# Patient Record
Sex: Female | Born: 1945 | Race: White | Hispanic: No | Smoking: Former smoker
Health system: Southern US, Community
[De-identification: ages and names within clinical notes are randomized; demographics above are authoritative.]

## PROBLEM LIST (undated history)

## (undated) DIAGNOSIS — D649 Anemia, unspecified: Secondary | ICD-10-CM

## (undated) DIAGNOSIS — J302 Other seasonal allergic rhinitis: Secondary | ICD-10-CM

## (undated) DIAGNOSIS — F419 Anxiety disorder, unspecified: Secondary | ICD-10-CM

## (undated) DIAGNOSIS — K219 Gastro-esophageal reflux disease without esophagitis: Secondary | ICD-10-CM

## (undated) DIAGNOSIS — Z9989 Dependence on other enabling machines and devices: Secondary | ICD-10-CM

## (undated) DIAGNOSIS — R011 Cardiac murmur, unspecified: Secondary | ICD-10-CM

## (undated) DIAGNOSIS — M25569 Pain in unspecified knee: Secondary | ICD-10-CM

## (undated) DIAGNOSIS — I639 Cerebral infarction, unspecified: Secondary | ICD-10-CM

## (undated) DIAGNOSIS — F32A Depression, unspecified: Secondary | ICD-10-CM

## (undated) DIAGNOSIS — G4733 Obstructive sleep apnea (adult) (pediatric): Secondary | ICD-10-CM

## (undated) DIAGNOSIS — I482 Chronic atrial fibrillation, unspecified: Secondary | ICD-10-CM

## (undated) DIAGNOSIS — F329 Major depressive disorder, single episode, unspecified: Secondary | ICD-10-CM

## (undated) DIAGNOSIS — T753XXA Motion sickness, initial encounter: Secondary | ICD-10-CM

## (undated) DIAGNOSIS — M1990 Unspecified osteoarthritis, unspecified site: Secondary | ICD-10-CM

## (undated) HISTORY — DX: Chronic atrial fibrillation, unspecified: I48.20

## (undated) HISTORY — DX: Obstructive sleep apnea (adult) (pediatric): G47.33

## (undated) HISTORY — PX: EYE SURGERY: SHX253

## (undated) HISTORY — PX: TUBAL LIGATION: SHX77

## (undated) HISTORY — DX: Dependence on other enabling machines and devices: Z99.89

---

## 2010-12-25 ENCOUNTER — Ambulatory Visit: Payer: Self-pay | Admitting: Internal Medicine

## 2011-03-04 ENCOUNTER — Ambulatory Visit: Payer: Self-pay | Admitting: Internal Medicine

## 2012-07-30 ENCOUNTER — Ambulatory Visit: Payer: Self-pay | Admitting: Internal Medicine

## 2012-09-02 ENCOUNTER — Ambulatory Visit: Payer: Self-pay | Admitting: Internal Medicine

## 2012-09-14 ENCOUNTER — Ambulatory Visit: Payer: Self-pay | Admitting: Internal Medicine

## 2013-09-14 ENCOUNTER — Ambulatory Visit: Payer: Self-pay | Admitting: Gastroenterology

## 2013-09-17 LAB — PATHOLOGY REPORT

## 2013-09-29 ENCOUNTER — Ambulatory Visit: Payer: Self-pay | Admitting: Internal Medicine

## 2014-03-15 ENCOUNTER — Ambulatory Visit: Payer: Self-pay | Admitting: Specialist

## 2014-05-10 ENCOUNTER — Emergency Department: Payer: Self-pay | Admitting: Emergency Medicine

## 2014-05-10 LAB — COMPREHENSIVE METABOLIC PANEL
Albumin: 3.9 g/dL (ref 3.4–5.0)
Alkaline Phosphatase: 55 U/L
Anion Gap: 6 — ABNORMAL LOW (ref 7–16)
BUN: 12 mg/dL (ref 7–18)
Bilirubin,Total: 0.5 mg/dL (ref 0.2–1.0)
CHLORIDE: 108 mmol/L — AB (ref 98–107)
Calcium, Total: 8.7 mg/dL (ref 8.5–10.1)
Co2: 29 mmol/L (ref 21–32)
Creatinine: 0.91 mg/dL (ref 0.60–1.30)
EGFR (African American): >60
EGFR (Non-African Amer.): >60
Glucose: 119 mg/dL — ABNORMAL HIGH (ref 65–99)
OSMOLALITY: 286 (ref 275–301)
POTASSIUM: 3.8 mmol/L (ref 3.5–5.1)
SGOT(AST): 14 U/L — ABNORMAL LOW (ref 15–37)
SGPT (ALT): 28 U/L (ref 12–78)
Sodium: 143 mmol/L (ref 136–145)
Total Protein: 7.4 g/dL (ref 6.4–8.2)

## 2014-05-10 LAB — CBC
HCT: 42.4 % (ref 35.0–47.0)
HGB: 14.2 g/dL (ref 12.0–16.0)
MCH: 30.8 pg (ref 26.0–34.0)
MCHC: 33.5 g/dL (ref 32.0–36.0)
MCV: 92 fL (ref 80–100)
Platelet: 202 10*3/uL (ref 150–440)
RBC: 4.62 10*6/uL (ref 3.80–5.20)
RDW: 14.7 % — AB (ref 11.5–14.5)
WBC: 8.5 10*3/uL (ref 3.6–11.0)

## 2014-05-10 LAB — TROPONIN I

## 2014-05-24 DIAGNOSIS — H35342 Macular cyst, hole, or pseudohole, left eye: Secondary | ICD-10-CM | POA: Insufficient documentation

## 2014-05-24 DIAGNOSIS — H40033 Anatomical narrow angle, bilateral: Secondary | ICD-10-CM | POA: Insufficient documentation

## 2014-05-25 DIAGNOSIS — H43823 Vitreomacular adhesion, bilateral: Secondary | ICD-10-CM | POA: Insufficient documentation

## 2014-05-25 DIAGNOSIS — H269 Unspecified cataract: Secondary | ICD-10-CM | POA: Insufficient documentation

## 2014-05-25 DIAGNOSIS — H21562 Pupillary abnormality, left eye: Secondary | ICD-10-CM | POA: Insufficient documentation

## 2014-05-27 DIAGNOSIS — I10 Essential (primary) hypertension: Secondary | ICD-10-CM | POA: Insufficient documentation

## 2014-05-27 DIAGNOSIS — F419 Anxiety disorder, unspecified: Secondary | ICD-10-CM | POA: Insufficient documentation

## 2014-07-13 DIAGNOSIS — H01009 Unspecified blepharitis unspecified eye, unspecified eyelid: Secondary | ICD-10-CM | POA: Insufficient documentation

## 2014-08-11 ENCOUNTER — Ambulatory Visit: Payer: Self-pay | Admitting: Internal Medicine

## 2015-04-28 ENCOUNTER — Encounter: Payer: Self-pay | Admitting: *Deleted

## 2015-05-01 NOTE — Discharge Instructions (Signed)

## 2015-05-03 ENCOUNTER — Encounter: Payer: Self-pay | Admitting: *Deleted

## 2015-05-03 ENCOUNTER — Ambulatory Visit: Payer: PPO | Admitting: Anesthesiology

## 2015-05-03 ENCOUNTER — Encounter
Admission: RE | Disposition: A | Discharge status: Home / Self Care | Payer: Self-pay | Source: Ambulatory Visit | Attending: Ophthalmology

## 2015-05-03 ENCOUNTER — Other Ambulatory Visit: Payer: Self-pay

## 2015-05-03 ENCOUNTER — Encounter: Payer: Self-pay | Admitting: Emergency Medicine

## 2015-05-03 ENCOUNTER — Ambulatory Visit
Admission: RE | Admit: 2015-05-03 | Discharge: 2015-05-03 | Disposition: A | Discharge status: Home / Self Care | Payer: PPO | Source: Ambulatory Visit | Attending: Ophthalmology | Admitting: Ophthalmology

## 2015-05-03 ENCOUNTER — Emergency Department
Admission: EM | Admit: 2015-05-03 | Discharge: 2015-05-03 | Disposition: A | Discharge status: Home / Self Care | Payer: PPO | Attending: Emergency Medicine | Admitting: Emergency Medicine

## 2015-05-03 ENCOUNTER — Ambulatory Visit (INDEPENDENT_AMBULATORY_CARE_PROVIDER_SITE_OTHER): Payer: PPO | Admitting: Family Medicine

## 2015-05-03 ENCOUNTER — Encounter: Payer: Self-pay | Admitting: Family Medicine

## 2015-05-03 VITALS — BP 140/74 | HR 85 | Temp 98.0°F | Resp 17 | Wt 220.2 lb

## 2015-05-03 DIAGNOSIS — M1990 Unspecified osteoarthritis, unspecified site: Secondary | ICD-10-CM | POA: Insufficient documentation

## 2015-05-03 DIAGNOSIS — Z79899 Other long term (current) drug therapy: Secondary | ICD-10-CM | POA: Diagnosis not present

## 2015-05-03 DIAGNOSIS — Z87891 Personal history of nicotine dependence: Secondary | ICD-10-CM | POA: Diagnosis not present

## 2015-05-03 DIAGNOSIS — F329 Major depressive disorder, single episode, unspecified: Secondary | ICD-10-CM | POA: Insufficient documentation

## 2015-05-03 DIAGNOSIS — M25569 Pain in unspecified knee: Secondary | ICD-10-CM | POA: Diagnosis not present

## 2015-05-03 DIAGNOSIS — K219 Gastro-esophageal reflux disease without esophagitis: Secondary | ICD-10-CM | POA: Insufficient documentation

## 2015-05-03 DIAGNOSIS — R9431 Abnormal electrocardiogram [ECG] [EKG]: Secondary | ICD-10-CM | POA: Diagnosis present

## 2015-05-03 DIAGNOSIS — Z91018 Allergy to other foods: Secondary | ICD-10-CM | POA: Insufficient documentation

## 2015-05-03 DIAGNOSIS — I4891 Unspecified atrial fibrillation: Secondary | ICD-10-CM | POA: Diagnosis not present

## 2015-05-03 DIAGNOSIS — Z91040 Latex allergy status: Secondary | ICD-10-CM | POA: Diagnosis not present

## 2015-05-03 DIAGNOSIS — H2512 Age-related nuclear cataract, left eye: Secondary | ICD-10-CM | POA: Diagnosis not present

## 2015-05-03 DIAGNOSIS — G4730 Sleep apnea, unspecified: Secondary | ICD-10-CM | POA: Diagnosis not present

## 2015-05-03 DIAGNOSIS — R011 Cardiac murmur, unspecified: Secondary | ICD-10-CM | POA: Insufficient documentation

## 2015-05-03 DIAGNOSIS — Z91041 Radiographic dye allergy status: Secondary | ICD-10-CM | POA: Insufficient documentation

## 2015-05-03 DIAGNOSIS — Z91048 Other nonmedicinal substance allergy status: Secondary | ICD-10-CM | POA: Diagnosis not present

## 2015-05-03 HISTORY — DX: Motion sickness, initial encounter: T75.3XXA

## 2015-05-03 HISTORY — DX: Cardiac murmur, unspecified: R01.1

## 2015-05-03 HISTORY — DX: Pain in unspecified knee: M25.569

## 2015-05-03 HISTORY — DX: Unspecified osteoarthritis, unspecified site: M19.90

## 2015-05-03 HISTORY — PX: RETINAL DETACHMENT SURGERY: SHX105

## 2015-05-03 HISTORY — DX: Major depressive disorder, single episode, unspecified: F32.9

## 2015-05-03 HISTORY — DX: Anemia, unspecified: D64.9

## 2015-05-03 HISTORY — DX: Depression, unspecified: F32.A

## 2015-05-03 HISTORY — DX: Gastro-esophageal reflux disease without esophagitis: K21.9

## 2015-05-03 HISTORY — PX: CATARACT EXTRACTION W/PHACO: SHX586

## 2015-05-03 LAB — BASIC METABOLIC PANEL
Anion gap: 6 (ref 5–15)
BUN: 16 mg/dL (ref 6–20)
CO2: 28 mmol/L (ref 22–32)
Calcium: 8.9 mg/dL (ref 8.9–10.3)
Chloride: 107 mmol/L (ref 101–111)
Creatinine, Ser: 0.99 mg/dL (ref 0.44–1.00)
GFR calc Af Amer: >60 mL/min (ref >60)
GFR calc non Af Amer: 58 mL/min — ABNORMAL LOW (ref >60)
GLUCOSE: 96 mg/dL (ref 65–99)
Potassium: 4.3 mmol/L (ref 3.5–5.1)
Sodium: 141 mmol/L (ref 135–145)

## 2015-05-03 LAB — CBC
HCT: 40.5 % (ref 35.0–47.0)
HEMOGLOBIN: 13.2 g/dL (ref 12.0–16.0)
MCH: 29.8 pg (ref 26.0–34.0)
MCHC: 32.7 g/dL (ref 32.0–36.0)
MCV: 91.3 fL (ref 80.0–100.0)
Platelets: 185 10*3/uL (ref 150–440)
RBC: 4.43 MIL/uL (ref 3.80–5.20)
RDW: 14.8 % — AB (ref 11.5–14.5)
WBC: 7.9 10*3/uL (ref 3.6–11.0)

## 2015-05-03 SURGERY — PHACOEMULSIFICATION, CATARACT, WITH IOL INSERTION
Anesthesia: Monitor Anesthesia Care | Laterality: Left | Wound class: Clean

## 2015-05-03 MED ORDER — CEFUROXIME OPHTHALMIC INJECTION 1 MG/0.1 ML
INJECTION | OPHTHALMIC | Status: DC | PRN
  Administered 2015-05-03: 0.1 mL via INTRACAMERAL

## 2015-05-03 MED ORDER — ACETAMINOPHEN 325 MG PO TABS: 325.0000 mg | ORAL_TABLET | ORAL | Status: DC | PRN

## 2015-05-03 MED ORDER — BRIMONIDINE TARTRATE 0.2 % OP SOLN
OPHTHALMIC | Status: DC | PRN
  Administered 2015-05-03: 1 [drp]

## 2015-05-03 MED ORDER — TIMOLOL MALEATE 0.5 % OP SOLN
OPHTHALMIC | Status: DC | PRN
  Administered 2015-05-03: 1 [drp]

## 2015-05-03 MED ORDER — ACETAMINOPHEN 160 MG/5ML PO SOLN: 325.0000 mg | ORAL | Status: DC | PRN

## 2015-05-03 MED ORDER — NA HYALUR & NA CHOND-NA HYALUR 0.4-0.35 ML IO KIT
PACK | INTRAOCULAR | Status: DC | PRN
  Administered 2015-05-03: 1 mL via INTRAOCULAR

## 2015-05-03 MED ORDER — BSS IO SOLN
INTRAOCULAR | Status: DC | PRN
  Administered 2015-05-03: 15 mL via INTRAOCULAR

## 2015-05-03 MED ORDER — POVIDONE-IODINE 5 % OP SOLN
1.0000 "application " | OPHTHALMIC | Status: DC | PRN
  Administered 2015-05-03: 1 via OPHTHALMIC

## 2015-05-03 MED ORDER — MIDAZOLAM HCL 2 MG/2ML IJ SOLN
INTRAMUSCULAR | Status: DC | PRN
  Administered 2015-05-03: 1 mg via INTRAVENOUS

## 2015-05-03 MED ORDER — ARMC OPHTHALMIC DILATING GEL
1.0000 "application " | OPHTHALMIC | Status: DC | PRN
  Administered 2015-05-03 (×2): 1 via OPHTHALMIC

## 2015-05-03 MED ORDER — LACTATED RINGERS IV SOLN: INTRAVENOUS | Status: DC

## 2015-05-03 MED ORDER — EPINEPHRINE HCL 1 MG/ML IJ SOLN
INTRAOCULAR | Status: DC | PRN
  Administered 2015-05-03: 89 mL via OPHTHALMIC

## 2015-05-03 MED ORDER — FENTANYL CITRATE (PF) 100 MCG/2ML IJ SOLN
INTRAMUSCULAR | Status: DC | PRN
  Administered 2015-05-03: 50 ug via INTRAVENOUS

## 2015-05-03 MED ORDER — TETRACAINE HCL 0.5 % OP SOLN
1.0000 [drp] | OPHTHALMIC | Status: DC | PRN
  Administered 2015-05-03: 1 [drp] via OPHTHALMIC

## 2015-05-03 SURGICAL SUPPLY — 26 items
CANNULA ANT/CHMB 27GA (MISCELLANEOUS) ×2 IMPLANT
GLOVE SURG LX 7.5 STRW (GLOVE) ×1
GLOVE SURG LX STRL 7.5 STRW (GLOVE) ×1 IMPLANT
GLOVE SURG TRIUMPH 8.0 PF LTX (GLOVE) ×2 IMPLANT
GOWN STRL REUS W/ TWL LRG LVL3 (GOWN DISPOSABLE) ×2 IMPLANT
GOWN STRL REUS W/TWL LRG LVL3 (GOWN DISPOSABLE) ×2
LENS IOL TECNIS 22.0 (Intraocular Lens) ×2 IMPLANT
LENS IOL TECNIS MONO 1P 22.0 (Intraocular Lens) ×1 IMPLANT
MARKER SKIN SURG W/RULER VIO (MISCELLANEOUS) ×2 IMPLANT
NDL RETROBULBAR .5 NSTRL (NEEDLE) IMPLANT
NEEDLE FILTER BLUNT 18X 1/2SAF (NEEDLE) ×1
NEEDLE FILTER BLUNT 18X1 1/2 (NEEDLE) ×1 IMPLANT
PACK CATARACT BRASINGTON (MISCELLANEOUS) ×2 IMPLANT
PACK EYE AFTER SURG (MISCELLANEOUS) ×2 IMPLANT
PACK OPTHALMIC (MISCELLANEOUS) ×2 IMPLANT
RING MALYGIN 7.0 (MISCELLANEOUS) IMPLANT
SUT ETHILON 10-0 CS-B-6CS-B-6 (SUTURE)
SUT VICRYL  9 0 (SUTURE)
SUT VICRYL 9 0 (SUTURE) IMPLANT
SUTURE EHLN 10-0 CS-B-6CS-B-6 (SUTURE) IMPLANT
SYR 3ML LL SCALE MARK (SYRINGE) ×2 IMPLANT
SYR 5ML LL (SYRINGE) IMPLANT
SYR TB 1ML LUER SLIP (SYRINGE) ×2 IMPLANT
WATER STERILE IRR 250ML POUR (IV SOLUTION) ×2 IMPLANT
WATER STERILE IRR 500ML POUR (IV SOLUTION) IMPLANT
WIPE NON LINTING 3.25X3.25 (MISCELLANEOUS) ×2 IMPLANT

## 2015-05-03 NOTE — Anesthesia Postprocedure Evaluation (Signed)
  Anesthesia Post-op Note  Patient: Crystal Haas  Procedure(s) Performed: Procedure(s) with comments: CATARACT EXTRACTION PHACO AND INTRAOCULAR LENS PLACEMENT (IOC) (Left) - CPAP  Anesthesia type:MAC  Patient location: PACU  Post pain: Pain level controlled  Post assessment: Post-op Vital signs reviewed, Patient's Cardiovascular Status Stable, Respiratory Function Stable, Patent Airway and No signs of Nausea or vomiting  Post vital signs: Reviewed and stable  Last Vitals:  Filed Vitals:   05/03/15 1028  BP:   Pulse: 71  Temp: 36.3 C  Resp: 16    Level of consciousness: awake, alert  and patient cooperative  Complications: No apparent anesthesia complications.  Pt noted to be in new-onset Atrial Fibrillation in PACU, confirmed with 12-lead EKG.  Pt remained stable with HR in 70s, and normal BP.  Pt feeling well.  Called Pts primary MD, Dr. Sherryll BurgerShah, and discussed having her go directly to his office.  He agreed to see her today when she arrives.  We agreed that she is stable and able to be transported with her friend who is with her now.  Dr. Sherryll BurgerShah will follow up as indicated.

## 2015-05-03 NOTE — ED Notes (Signed)
Pt sent over by pcp with new onset of AFIB. Pt had eye surgery this am and ekg showed afib after surgery which is new for pt. Pt denies any pain at this time.

## 2015-05-03 NOTE — Op Note (Signed)
OPERATIVE NOTE  Crystal Haas [id number] 05/03/2015   PREOPERATIVE DIAGNOSIS:  Nuclear sclerotic cataract left eye. H25.12   POSTOPERATIVE DIAGNOSIS:    Nuclear sclerotic cataract left eye.     PROCEDURE:  Phacoemusification with posterior chamber intraocular lens placement of the left eye   LENS:   Implant Name Type Inv. Item Serial No. Manufacturer Lot No. LRB No. Used  zcboo lens     [id number] [employer]   Left 1     [id number] 22.0 D   ULTRAS D TIME: 15% of 1minutes 35econds, CDE 14.5  SURGEON:  Deirdre Evenerhadwick R. Marcelus Dubberly, MD   ANESTHESIA:  Topical with tetracaine drops and 2% Xylocaine jelly.   COMPLICATIONS:  None.   DESCRIPTION OF PROCEDURE:  The patient was identified in the holding room and transported to the operating room and placed in the supine position under the operating microscope.  The left eye was identified as the operative eye and it was prepped and draped in the usual sterile ophthalmic fashion.   A 1 millimeter clear-corneal paracentesis was made at the 1:30 position.  The anterior chamber was filled with Viscoat viscoelastic.  A 2.4 millimeter keratome was used to make a near-clear corneal incision at the 10:30 position.  .  A curvilinear capsulorrhexis was made with a cystotome and capsulorrhexis forceps.  Balanced salt solution was used to hydrodissect and hydrodelineate the nucleus.   Phacoemulsification was then used in stop and chop fashion to remove the lens nucleus and epinucleus.  The remaining cortex was then removed using the irrigation and aspiration handpiece. Provisc was then placed into the capsular bag to distend it for lens placement.  A lens was then injected into the capsular bag.  The remaining viscoelastic was aspirated.   Wounds were hydrated with balanced salt solution.  The anterior chamber was inflated to a physiologic pressure with balanced salt solution.  No wound leaks were noted. Cefuroxime 0.1 ml of a 10mg /ml solution was  injected into the anterior chamber for a dose of 1 mg of intracameral antibiotic at the completion of the case.  Timolol and Brimonidine drops were applied to the eye.  The patient was taken to the recovery room in stable condition without complications of anesthesia or surgery.  Crystal Haas 05/03/2015, 10:28 AM

## 2015-05-03 NOTE — H&P (Signed)
  The History and Physical notes were scanned in.  The patient remains stable and unchanged from the H&P.   Previous H&P reviewed, patient examined, and there are no changes.  Crystal Haas 05/03/2015 12:35 PM

## 2015-05-03 NOTE — Anesthesia Preprocedure Evaluation (Signed)
Anesthesia Evaluation  Patient identified by MRN, date of birth, ID band  Reviewed: Allergy & Precautions, H&P , NPO status , Patient's Chart, lab work & pertinent test results  Airway Mallampati: II  TM Distance: >3 FB Neck ROM: full    Dental no notable dental hx.    Pulmonary sleep apnea , former smoker,    Pulmonary exam normal       Cardiovascular Rhythm:regular Rate:Normal     Neuro/Psych    GI/Hepatic GERD-  ,  Endo/Other    Renal/GU      Musculoskeletal   Abdominal   Peds  Hematology   Anesthesia Other Findings   Reproductive/Obstetrics                             Anesthesia Physical Anesthesia Plan  ASA: II  Anesthesia Plan: MAC   Post-op Pain Management:    Induction:   Airway Management Planned:   Additional Equipment:   Intra-op Plan:   Post-operative Plan:   Informed Consent: I have reviewed the patients History and Physical, chart, labs and discussed the procedure including the risks, benefits and alternatives for the proposed anesthesia with the patient or authorized representative who has indicated his/her understanding and acceptance.     Plan Discussed with: CRNA  Anesthesia Plan Comments:         Anesthesia Quick Evaluation

## 2015-05-03 NOTE — Discharge Instructions (Signed)
Atrial Fibrillation °Atrial fibrillation is a type of irregular heart rhythm (arrhythmia). During atrial fibrillation, the upper chambers of the heart (atria) quiver continuously in a chaotic pattern. This causes an irregular and often rapid heart rate.  °Atrial fibrillation is the result of the heart becoming overloaded with disorganized signals that tell it to beat. These signals are normally released one at a time by a part of the right atrium called the sinoatrial node. They then travel from the atria to the lower chambers of the heart (ventricles), causing the atria and ventricles to contract and pump blood as they pass. In atrial fibrillation, parts of the atria outside of the sinoatrial node also release these signals. This results in two problems. First, the atria receive so many signals that they do not have time to fully contract. Second, the ventricles, which can only receive one signal at a time, beat irregularly and out of rhythm with the atria.  °There are three types of atrial fibrillation:  °· Paroxysmal. Paroxysmal atrial fibrillation starts suddenly and stops on its own within a week. °· Persistent. Persistent atrial fibrillation lasts for more than a week. It may stop on its own or with treatment. °· Permanent. Permanent atrial fibrillation does not go away. Episodes of atrial fibrillation may lead to permanent atrial fibrillation. °Atrial fibrillation can prevent your heart from pumping blood normally. It increases your risk of stroke and can lead to heart failure.  °CAUSES  °· Heart conditions, including a heart attack, heart failure, coronary artery disease, and heart valve conditions.   °· Inflammation of the sac that surrounds the heart (pericarditis). °· Blockage of an artery in the lungs (pulmonary embolism). °· Pneumonia or other infections. °· Chronic lung disease. °· Thyroid problems, especially if the thyroid is overactive (hyperthyroidism). °· Caffeine, excessive alcohol use, and use  of some illegal drugs.   °· Use of some medicines, including certain decongestants and diet pills. °· Heart surgery.   °· Birth defects.   °Sometimes, no cause can be found. When this happens, the atrial fibrillation is called lone atrial fibrillation. The risk of complications from atrial fibrillation increases if you have lone atrial fibrillation and you are age 60 years or older. °RISK FACTORS °· Heart failure. °· Coronary artery disease. °· Diabetes mellitus.   °· High blood pressure (hypertension).   °· Obesity.   °· Other arrhythmias.   °· Increased age. °SIGNS AND SYMPTOMS  °· A feeling that your heart is beating rapidly or irregularly.   °· A feeling of discomfort or pain in your chest.   °· Shortness of breath.   °· Sudden light-headedness or weakness.   °· Getting tired easily when exercising.   °· Urinating more often than normal (mainly when atrial fibrillation first begins).   °In paroxysmal atrial fibrillation, symptoms may start and suddenly stop. °DIAGNOSIS  °Your health care provider may be able to detect atrial fibrillation when taking your pulse. Your health care provider may have you take a test called an ambulatory electrocardiogram (ECG). An ECG records your heartbeat patterns over a 24-hour period. You may also have other tests, such as: °· Transthoracic echocardiogram (TTE). During echocardiography, sound waves are used to evaluate how blood flows through your heart. °· Transesophageal echocardiogram (TEE). °· Stress test. There is more than one type of stress test. If a stress test is needed, ask your health care provider about which type is best for you. °· Chest X-ray exam. °· Blood tests. °· Computed tomography (CT). °TREATMENT  °Treatment may include: °· Treating any underlying conditions. For example, if you   have an overactive thyroid, treating the condition may correct atrial fibrillation.  Taking medicine. Medicines may be given to control a rapid heart rate or to prevent blood  clots, heart failure, or a stroke.  Having a procedure to correct the rhythm of the heart:  Electrical cardioversion. During electrical cardioversion, a controlled, low-energy shock is delivered to the heart through your skin. If you have chest pain, very low blood pressure, or sudden heart failure, this procedure may need to be done as an emergency.  Catheter ablation. During this procedure, heart tissues that send the signals that cause atrial fibrillation are destroyed.  Surgical ablation. During this surgery, thin lines of heart tissue that carry the abnormal signals are destroyed. This procedure can either be an open-heart surgery or a minimally invasive surgery. With the minimally invasive surgery, small cuts are made to access the heart instead of a large opening.  Pulmonary venous isolation. During this surgery, tissue around the veins that carry blood from the lungs (pulmonary veins) is destroyed. This tissue is thought to carry the abnormal signals. HOME CARE INSTRUCTIONS   Take medicines only as directed by your health care provider. Some medicines can make atrial fibrillation worse or recur.  If blood thinners were prescribed by your health care provider, take them exactly as directed. Too much blood-thinning medicine can cause bleeding. If you take too little, you will not have the needed protection against stroke and other problems.  Perform blood tests at home if directed by your health care provider. Perform blood tests exactly as directed.  Quit smoking if you smoke.  Do not drink alcohol.  Do not drink caffeinated beverages such as coffee, soda, and some teas. You may drink decaffeinated coffee, soda, or tea.   Maintain a healthy weight.Do not use diet pills unless your health care provider approves. They may make heart problems worse.   Follow diet instructions as directed by your health care provider.  Exercise regularly as directed by your health care  provider.  Keep all follow-up visits as directed by your health care provider. This is important. PREVENTION  The following substances can cause atrial fibrillation to recur:   Caffeinated beverages.  Alcohol.  Certain medicines, especially those used for breathing problems.  Certain herbs and herbal medicines, such as those containing ephedra or ginseng.  Illegal drugs, such as cocaine and amphetamines. Sometimes medicines are given to prevent atrial fibrillation from recurring. Proper treatment of any underlying condition is also important in helping prevent recurrence.  SEEK MEDICAL CARE IF:  You notice a change in the rate, rhythm, or strength of your heartbeat.  You suddenly begin urinating more frequently.  You tire more easily when exerting yourself or exercising. SEEK IMMEDIATE MEDICAL CARE IF:   You have chest pain, abdominal pain, sweating, or weakness.  You feel nauseous.  You have shortness of breath.  You suddenly have swollen feet and ankles.  You feel dizzy.  Your face or limbs feel numb or weak.  You have a change in your vision or speech. MAKE SURE YOU:   Understand these instructions.  Will watch your condition.  Will get help right away if you are not doing well or get worse. Document Released: 10/07/2005 Document Revised: 02/21/2014 Document Reviewed: 11/17/2012 [facility] Patient Information 2015 [place], [place]. This information is not intended to replace advice given to you by your health care provider. Make sure you discuss any questions you have with your health care provider.      You  have been seen in the hospital today for new onset atrial fibrillation. As we have discussed please call the number provided for cardiology. He will need to be seen tomorrow in the office. If you have any issues being seen please let them know that we discussed your plan of care with Dr. Melburn PopperNasher who recommended you be seen tomorrow in the office. As we have  discussed please return to the emergency department if you have any trouble breathing, chest pain, fast heart rate, dizziness, or lightheadedness, also if you have any weakness or numbness of any arm or leg, slurred speech or confusion.

## 2015-05-03 NOTE — ED Notes (Signed)
Pt reports having having cataract surgery this morning.  Afterwards pt developed afib.  Pt sent to er for eval.  No chest pain, no sob. , no dizziness, no weakness.  Pt alert   Speech clear.  afib on monitor now.   md at bedside.

## 2015-05-03 NOTE — ED Notes (Signed)

## 2015-05-03 NOTE — ED Provider Notes (Signed)
[facility] Emergency Department Provider Note  Time seen: 5:51 PM  I have reviewed the triage vital signs and the nursing notes.   HISTORY  Chief Complaint Abnormal ECG    HPI Delene LollDiane Huyett is a 69 y.o. female with a past medical history of gastric reflux, depression, arthritis who presents to the emergency department with an irregular heartbeat. According to the patient she had a cataract surgery today. After the operation they noted her heart to be beating irregularly, they performed an EKG which showed atrial fibrillation in the setting the patient to the emergency department for evaluation. Patient denies any history of atrial fibrillation. Denies any cardiac history whatsoever. The patient has never seen a cardiologist. Patient cannot feel her heart beating irregularly. Denies any chest pain or shortness of breath. Denies any symptoms whatsoever.     Past Medical History  Diagnosis Date  . Heart murmur     mild - followed by PCP  . GERD (gastroesophageal reflux disease)   . Anemia     distant past  . Depression   . Arthritis     "everywhere" - big toes worst  . Knee pain   . Sleep apnea     uses CPAP SS - [facility] 2013  . Motion sickness     back seat of car    Patient Active Problem List   Diagnosis Date Noted  . New onset atrial fibrillation 05/03/2015    Past Surgical History  Procedure Laterality Date  . Retinal detachment surgery    . Tubal ligation      Current Outpatient Rx  Name  Route  Sig  Dispense  Refill  . diphenhydramine-acetaminophen (TYLENOL PM) 25-500 MG TABS   Oral   Take 1 tablet by mouth at bedtime as needed.         . DUREZOL 0.05 % EMUL            0     Dispense as written.   . eszopiclone (LUNESTA) 1 MG TABS tablet   Oral   Take by mouth.         . eszopiclone (LUNESTA) 2 MG TABS tablet   Oral   Take 2 mg by mouth at bedtime as needed for sleep. Take immediately before bedtime         .  fexofenadine (ALLEGRA) 180 MG tablet   Oral   Take 180 mg by mouth daily. PM         . glucosamine-chondroitin 500-400 MG tablet   Oral   Take 1 tablet by mouth daily. PM         . ketorolac (ACULAR) 0.4 % SOLN            0   . Multiple Vitamins-Minerals (CENTRUM PO)   Oral   Take by mouth. AM         . NAPHAZOLINE-PHENIRAMINE OP   Ophthalmic   Apply to eye.         Marland Kitchen. PARoxetine (PAXIL) 20 MG tablet   Oral   Take 20 mg by mouth daily. AM         . PARoxetine (PAXIL) 20 MG tablet   Oral   Take by mouth.         . temazepam (RESTORIL) 15 MG capsule   Oral   Take by mouth.         Marland Kitchen. VIGAMOX 0.5 % ophthalmic solution  0     Dispense as written.     Allergies Apple; Daucus carota; Ivp dye; Other; Strawberry; Iodine; Latex; and Tape  No family history on file.  Social History History  Substance Use Topics  . Smoking status: Former Smoker    Quit date: 10/21/1969  . Smokeless tobacco: Not on file  . Alcohol Use: 4.2 oz/week    7 Shots of liquor per week    Review of Systems Constitutional: Negative for fever. Cardiovascular: Negative for chest pain. Respiratory: Negative for shortness of breath. Gastrointestinal: Negative for abdominal pain Musculoskeletal: Negative for back pain. Neurological: Negative for headaches, focal weakness or numbness.  10-point ROS otherwise negative.  ____________________________________________   PHYSICAL EXAM:  VITAL SIGNS: ED Triage Vitals  Enc Vitals Group     BP 05/03/15 1627 156/99 mmHg     Pulse Rate 05/03/15 1627 80     Resp 05/03/15 1627 18     Temp 05/03/15 1629 98 F (36.7 C)     Temp Source 05/03/15 1629 Oral     SpO2 05/03/15 1627 97 %     Weight 05/03/15 1627 220 lb (99.791 kg)     Height 05/03/15 1627  (1.651 m)     Head Cir --      Peak Flow --      Pain Score --      Pain Loc --      Pain Edu? --      Excl. in GC? --     Constitutional: Alert and oriented.  Well appearing and in no distress. Eyes: Dilated left pupil status post surgery. ENT   Mouth/Throat: Mucous membranes are moist. Cardiovascular: Irregular rhythm, with a normal rate around 85 bpm. Respiratory: Normal respiratory effort without tachypnea nor retractions. Breath sounds are clear and equal bilaterally. No wheezes/rales/rhonchi. Gastrointestinal: Soft and nontender. No distention.   Musculoskeletal: Nontender with normal range of motion in all extremities. Mild bilateral tenderness, and pedal edema patient states which is chronic. Neurologic:  Normal speech and language. No gross focal neurologic deficits are appreciated. Speech is normal. Skin:  Skin is warm, dry and intact.  Psychiatric: Mood and affect are normal. Speech and behavior are normal.   ____________________________________________    EKG  EKG reviewed and interpreted by myself shows atrial fibrillation at 76 bpm, narrow QRS, left axis deviation, no ST changes noted.  ____________________________________________     INITIAL IMPRESSION / ASSESSMENT AND PLAN / ED COURSE  Pertinent labs & imaging results that were available during my care of the patient were reviewed by me and considered in my medical decision making (see chart for details).  Patient with new onset atrial fibrillation. We will check labs, and discussed with cardiology. We'll likely need to discuss with ophthalmology as well as patient had a surgery performed today, as far as anticoagulation recommendations.   ----------------------------------------- 6:30 PM on 05/03/2015 -----------------------------------------  Basic lab work largely within normal limits. Patient remains completely asymptomatic from her new onset atrial fibrillation. As it is rate controlled, and the patient has no symptoms at this time, I discussed the patient with the on-call cardiologist Dr.Nasher, who recommends discharging the patient home with follow-up in their  office tomorrow. He states he would not start any anticoagulation or aspirin therapy at this time until they can evaluate her. We will discharge the patient home with cardiology follow-up. I discussed this with patient, as well as strict return precautions for any chest pain, shortness of breath, or increased heart  rate. Patient is agreeable to plan.  ____________________________________________   FINAL CLINICAL IMPRESSION(S) / ED DIAGNOSES  New onset atrial fibrillation   Minna Antis, MD 05/03/15 1850

## 2015-05-03 NOTE — ED Notes (Signed)
Dr. Lenard LancePaduchowski aware of patient's bp, states ok to discharge patient.

## 2015-05-03 NOTE — Transfer of Care (Signed)
Immediate Anesthesia Transfer of Care Note  Patient: Crystal LollDiane Tersigni  Procedure(s) Performed: Procedure(s) with comments: CATARACT EXTRACTION PHACO AND INTRAOCULAR LENS PLACEMENT (IOC) (Left) - CPAP  Patient Location: PACU  Anesthesia Type: MAC  Level of Consciousness: awake, alert  and patient cooperative  Airway and Oxygen Therapy: Patient Spontanous Breathing and Patient connected to supplemental oxygen  Post-op Assessment: Post-op Vital signs reviewed, Patient's Cardiovascular Status Stable, Respiratory Function Stable, Patent Airway and No signs of Nausea or vomiting  Post-op Vital Signs: Reviewed and stable  Complications: No apparent anesthesia complications

## 2015-05-03 NOTE — Progress Notes (Signed)
Name: Crystal Haas   MRN: [id number]    DOB: 1946-01-05   Date:05/03/2015       Progress Note  Subjective  Chief Complaint  Chief Complaint  Patient presents with  . Follow-up    check heart    HPI Pt. Is sent over from [facility] by Dr. Francena Hanly (Anesthesiologist) for an episode of Atrial fibrillation detected on the monitor in the recovery area after her surgery for cataracts this morning. She did not feel any unusual or concerning symptoms at that time. She has no history of atrial fibrillation or heart disease and does not see a cardiologist. Upon arriving in our office this afternoon, she feels well, denies any chest pain, dyspnea, dizziness, palpitations, nausea, vomiting, or other concerning symptoms.   Past Medical History  Diagnosis Date  . Heart murmur     mild - followed by PCP  . GERD (gastroesophageal reflux disease)   . Anemia     distant past  . Depression   . Arthritis     "everywhere" - big toes worst  . Knee pain   . Sleep apnea     uses CPAP SS - [facility] 2013  . Motion sickness     back seat of car    Past Surgical History  Procedure Laterality Date  . Retinal detachment surgery    . Tubal ligation      No family history on file.  History   Social History  . Marital Status: Widowed    Spouse Name: N/A  . Number of Children: N/A  . Years of Education: N/A   Occupational History  . Not on file.   Social History Main Topics  . Smoking status: Former Smoker    Quit date: 10/21/1969  . Smokeless tobacco: Not on file  . Alcohol Use: 4.2 oz/week    7 Shots of liquor per week  . Drug Use: Not on file  . Sexual Activity: Not on file   Other Topics Concern  . Not on file   Social History Narrative     Current outpatient prescriptions:  .  diphenhydramine-acetaminophen (TYLENOL PM) 25-500 MG TABS, Take 1 tablet by mouth at bedtime as needed., Disp: , Rfl:  .  DUREZOL 0.05 % EMUL, , Disp: , Rfl: 0 .  eszopiclone (LUNESTA) 1 MG TABS  tablet, Take by mouth., Disp: , Rfl:  .  eszopiclone (LUNESTA) 2 MG TABS tablet, Take 2 mg by mouth at bedtime as needed for sleep. Take immediately before bedtime, Disp: , Rfl:  .  fexofenadine (ALLEGRA) 180 MG tablet, Take 180 mg by mouth daily. PM, Disp: , Rfl:  .  glucosamine-chondroitin 500-400 MG tablet, Take 1 tablet by mouth daily. PM, Disp: , Rfl:  .  ketorolac (ACULAR) 0.4 % SOLN, , Disp: , Rfl: 0 .  Multiple Vitamins-Minerals (CENTRUM PO), Take by mouth. AM, Disp: , Rfl:  .  NAPHAZOLINE-PHENIRAMINE OP, Apply to eye., Disp: , Rfl:  .  PARoxetine (PAXIL) 20 MG tablet, Take 20 mg by mouth daily. AM, Disp: , Rfl:  .  PARoxetine (PAXIL) 20 MG tablet, Take by mouth., Disp: , Rfl:  .  temazepam (RESTORIL) 15 MG capsule, Take by mouth., Disp: , Rfl:  .  VIGAMOX 0.5 % ophthalmic solution, , Disp: , Rfl: 0  Allergies  Allergen Reactions  . Apple Anaphylaxis    Throat swells but subsides with po benadry  . Daucus Carota Anaphylaxis    Peeling of carrot only but subsides with  po benadry  . Ivp Dye [Iodinated Diagnostic Agents] Shortness Of Breath    Also swelling.  Topical betadine is OK.  . Other Swelling    Raw fruits cause throat to swell. Topical contact with carrots cause hands to swell, ok to eat.  . Strawberry Anaphylaxis    Throat swells but subsides with benadryl  . Iodine Swelling    IV   . Latex Swelling    Hands swelled after wearing gloves at fruit stand.   . Tape Other (See Comments)    Most tapes case raw skin.  Paper tape is OK.     Review of Systems  Constitutional: Negative for fever, chills, malaise/fatigue and diaphoresis.  Respiratory: Negative for cough and shortness of breath.   Cardiovascular: Negative for chest pain, palpitations, orthopnea and leg swelling.  Neurological: Negative for dizziness.      Objective  Filed Vitals:   05/03/15 1350  BP: 140/74  Pulse: 85  Temp: 98 F (36.7 C)  TempSrc: Oral  Resp: 17  Weight: 220 lb 3.2 oz (99.882  kg)  SpO2: 93%    Physical Exam  Constitutional: She is oriented to person, place, and time and well-developed, well-nourished, and in no distress.  Neck: Carotid bruit is not present.  Cardiovascular: Normal rate, regular rhythm, S1 normal, S2 normal and normal heart sounds.   Pulmonary/Chest: Effort normal and breath sounds normal.  Abdominal: Soft. Bowel sounds are normal.  Musculoskeletal: She exhibits tenderness.       Left ankle: Tenderness.  Neurological: She is alert and oriented to person, place, and time.  Skin: Skin is warm and dry.  Nursing note and vitals reviewed.    Assessment & Plan 1. New onset atrial fibrillation - EKG 12-Lead  EKG in our office shows A-fib with controlled ventricular rate. Pt. Is asymptomatic. [facility] for complete evaluation of new-onset A-fib. Spoke with the ER charge Nurse in this regard. Pt. Will proceed to the ER with her friend.    Laela Deviney Asad A. Faylene KurtzShah [facility] 05/03/2015 2:38 PM

## 2015-05-04 ENCOUNTER — Ambulatory Visit (INDEPENDENT_AMBULATORY_CARE_PROVIDER_SITE_OTHER): Payer: PPO | Admitting: Cardiovascular Disease

## 2015-05-04 ENCOUNTER — Encounter: Payer: Self-pay | Admitting: Ophthalmology

## 2015-05-04 VITALS — BP 120/76 | HR 86 | Ht 65.0 in | Wt 218.0 lb

## 2015-05-04 DIAGNOSIS — I4891 Unspecified atrial fibrillation: Secondary | ICD-10-CM

## 2015-05-04 NOTE — Progress Notes (Signed)
Primary care physician: Dr. Sherryll BurgerShah  HPI  This is a pleasant 69 year old female who was referred for evaluation of newly diagnosed atrial fibrillation. She has no previous cardiac history. She has no previous history of hypertension, diabetes, stroke or heart failure. She has been relatively healthy throughout her life. She is not a smoker and has no family history of coronary artery disease or arrhythmia. She underwent cataract surgery yesterday under moderate sedation. Postoperatively, she was noted to have irregular heartbeats. EKG confirmed atrial fibrillation. She was sent to the emergency room. Her ventricular rate was controlled and she was overall asymptomatic. Thus, she was discharged home with outpatient cardiology follow-up recommended. She continues to deny any chest pain, shortness of breath, palpitations or dizziness. She is not aware of previous history of arrhythmia.  Allergies  Allergen Reactions  . Apple Anaphylaxis    Throat swells but subsides with po benadry  . Daucus Carota Anaphylaxis    Peeling of carrot only but subsides with po benadry  . Ivp Dye [Iodinated Diagnostic Agents] Shortness Of Breath    Also swelling.  Topical betadine is OK.  . Other Swelling    Raw fruits cause throat to swell. Topical contact with carrots cause hands to swell, ok to eat.  . Strawberry Anaphylaxis    Throat swells but subsides with benadryl  . Iodine Swelling    IV   . Latex Swelling    Hands swelled after wearing gloves at fruit stand.   . Tape Other (See Comments)    Most tapes case raw skin.  Paper tape is OK.     Current Outpatient Prescriptions on File Prior to Visit  Medication Sig Dispense Refill  . diphenhydramine-acetaminophen (TYLENOL PM) 25-500 MG TABS Take 1 tablet by mouth at bedtime as needed.    . DUREZOL 0.05 % EMUL   0  . eszopiclone (LUNESTA) 1 MG TABS tablet Take by mouth.    . fexofenadine (ALLEGRA) 180 MG tablet Take 180 mg by mouth daily. PM    .  glucosamine-chondroitin 500-400 MG tablet Take 1 tablet by mouth daily. PM    . ketorolac (ACULAR) 0.4 % SOLN   0  . Multiple Vitamins-Minerals (CENTRUM PO) Take by mouth. AM    . NAPHAZOLINE-PHENIRAMINE OP Apply to eye.    Marland Kitchen. PARoxetine (PAXIL) 20 MG tablet Take 20 mg by mouth daily. AM    . VIGAMOX 0.5 % ophthalmic solution   0   No current facility-administered medications on file prior to visit.     Past Medical History  Diagnosis Date  . Heart murmur     mild - followed by PCP  . GERD (gastroesophageal reflux disease)   . Anemia     distant past  . Depression   . Arthritis     "everywhere" - big toes worst  . Knee pain   . Sleep apnea     uses CPAP SS - [facility] 2013  . Motion sickness     back seat of car  . New onset a-fib      Past Surgical History  Procedure Laterality Date  . Retinal detachment surgery    . Tubal ligation    . Cataract extraction w/phaco Left 05/03/2015    Procedure: CATARACT EXTRACTION PHACO AND INTRAOCULAR LENS PLACEMENT (IOC);  Surgeon: Lockie Molahadwick Brasington, MD;  [facility];  Service: Ophthalmology;  Laterality: Left;  CPAP     Family History  Problem Relation Age of Onset  . Family history unknown:  Yes     History   Social History  . Marital Status: Widowed    Spouse Name: N/A  . Number of Children: N/A  . Years of Education: N/A   Occupational History  . Not on file.   Social History Main Topics  . Smoking status: Former Smoker    Quit date: 10/21/1969  . Smokeless tobacco: Not on file  . Alcohol Use: 4.2 oz/week    7 Shots of liquor per week  . Drug Use: Not on file  . Sexual Activity: Not on file   Other Topics Concern  . Not on file   Social History Narrative     ROS A 10 point review of system was performed. It is negative other than that mentioned in the history of present illness.   PHYSICAL EXAM   BP 120/76 mmHg  Pulse 86  Ht  (1.651 m)  Wt 218 lb (98.884 kg)  BMI 36.28  kg/m2 Constitutional: She is oriented to person, place, and time. She appears well-developed and well-nourished. No distress.  HENT: No nasal discharge.  Head: Normocephalic and atraumatic.  Eyes: Pupils are equal and round. No discharge.  Neck: Normal range of motion. Neck supple. No JVD present. No thyromegaly present.  Cardiovascular: Normal rate, regular rhythm, normal heart sounds. Exam reveals no gallop and no friction rub. No murmur heard.  Pulmonary/Chest: Effort normal and breath sounds normal. No stridor. No respiratory distress. She has no wheezes. She has no rales. She exhibits no tenderness.  Abdominal: Soft. Bowel sounds are normal. She exhibits no distension. There is no tenderness. There is no rebound and no guarding.  Musculoskeletal: Normal range of motion. She exhibits no edema and no tenderness.  Neurological: She is alert and oriented to person, place, and time. Coordination normal.  Skin: Skin is warm and dry. No rash noted. She is not diaphoretic. No erythema. No pallor.  Psychiatric: She has a normal mood and affect. Her behavior is normal. Judgment and thought content normal.     UEA:VWUJWJ fibrillation  Low voltage -possible pulmonary disease.   ABNORMAL    ASSESSMENT AND PLAN

## 2015-05-04 NOTE — Assessment & Plan Note (Signed)
The patient has newly diagnosed atrial fibrillation. Ventricular rate is controlled without any medications and she is completely asymptomatic. It appears that the onset was after cataract surgery. However, she might have paroxysmal atrial fibrillation and the timing was just coincidental. I requested an echocardiogram to evaluate for any structural heart abnormalities. CHADS VASc score is 2. Thus, if atrial fibrillation persists, I recommend anticoagulation. I would have her follow-up after echo.

## 2015-05-04 NOTE — Patient Instructions (Signed)
Medication Instructions:  Your physician recommends that you continue on your current medications as directed. Please refer to the Current Medication list given to you today.   Labwork: none  Testing/Procedures: Your physician has requested that you have an echocardiogram. Echocardiography is a painless test that uses sound waves to create images of your heart. It provides your doctor with information about the size and shape of your heart and how well your heart's chambers and valves are working. This procedure takes approximately one hour. There are no restrictions for this procedure.    Follow-Up: Your physician recommends that you schedule a follow-up appointment after echo with Dr. Kirke CorinArida.    Any Other Special Instructions Will Be Listed Below (If Applicable).  Echocardiogram An echocardiogram, or echocardiography, uses sound waves (ultrasound) to produce an image of your heart. The echocardiogram is simple, painless, obtained within a short period of time, and offers valuable information to your health care provider. The images from an echocardiogram can provide information such as:  Evidence of coronary artery disease (CAD).  Heart size.  Heart muscle function.  Heart valve function.  Aneurysm detection.  Evidence of a past heart attack.  Fluid buildup around the heart.  Heart muscle thickening.  Assess heart valve function. LET [facility] PROVIDER KNOW ABOUT:  Any allergies you have.  All medicines you are taking, including vitamins, herbs, eye drops, creams, and over-the-counter medicines.  Previous problems you or members of your family have had with the use of anesthetics.  Any blood disorders you have.  Previous surgeries you have had.  Medical conditions you have.  Possibility of pregnancy, if this applies. BEFORE THE PROCEDURE  No special preparation is needed. Eat and drink normally.  PROCEDURE   In order to produce an image of your heart,  gel will be applied to your chest and a wand-like tool (transducer) will be moved over your chest. The gel will help transmit the sound waves from the transducer. The sound waves will harmlessly bounce off your heart to allow the heart images to be captured in real-time motion. These images will then be recorded.  You may need an IV to receive a medicine that improves the quality of the pictures. AFTER THE PROCEDURE You may return to your normal schedule including diet, activities, and medicines, unless your health care provider tells you otherwise. Document Released: 10/04/2000 Document Revised: 02/21/2014 Document Reviewed: 06/14/2013 [facility] Patient Information 2015 [place] [place]. This information is not intended to replace advice given to you by your health care provider. Make sure you discuss any questions you have with your health care provider.

## 2015-05-11 ENCOUNTER — Ambulatory Visit (INDEPENDENT_AMBULATORY_CARE_PROVIDER_SITE_OTHER): Payer: PPO

## 2015-05-11 ENCOUNTER — Other Ambulatory Visit: Payer: Self-pay

## 2015-05-11 ENCOUNTER — Other Ambulatory Visit: Payer: PPO

## 2015-05-11 DIAGNOSIS — I4891 Unspecified atrial fibrillation: Secondary | ICD-10-CM | POA: Diagnosis not present

## 2015-05-15 ENCOUNTER — Telehealth: Payer: Self-pay

## 2015-05-15 NOTE — Telephone Encounter (Signed)
Pt called back and asks if she is in afib, is there anything that can be done now for that? Please call and advise.

## 2015-05-18 NOTE — Telephone Encounter (Signed)
Per verbal from Dr. Kirke Corin: Have pt come in for EKG. If she is still in afib, start her on Eliquis  BID  S/w pt who states she will come in tomorrow for EKG

## 2015-05-19 ENCOUNTER — Ambulatory Visit: Payer: PPO

## 2015-05-19 ENCOUNTER — Telehealth: Payer: Self-pay

## 2015-05-19 VITALS — HR 72 | Resp 16

## 2015-05-19 DIAGNOSIS — I4891 Unspecified atrial fibrillation: Secondary | ICD-10-CM

## 2015-05-19 NOTE — Telephone Encounter (Signed)
Confirmed with Dr. Kirke Corin via phone :  Eliquis  BID for afib/aflutter  Samples of this drug were given to the patient, quantity 4 packs, Lot Number [id number] exp 1/19

## 2015-05-19 NOTE — Patient Instructions (Signed)
1.) Reason for visit: EKG  2.) Name of MD requesting visit: Arida  3.) H&P: new onset afib.   4.) ROS related to problem: Was seen in ER new onset afib. At OV, echo ordered. Showed afib. Currently no anticoagulation.   5.) Assessment and plan per MD: EKG reviewed by Dr. Mariah Milling who confirmed Afib. Per Dr. Mariah Milling, have Dr. Kirke Corin review and confirm he wants pt on Eliquis  BID

## 2015-05-29 ENCOUNTER — Ambulatory Visit: Payer: Self-pay | Admitting: Family Medicine

## 2015-05-30 ENCOUNTER — Encounter: Payer: Self-pay | Admitting: Family Medicine

## 2015-05-30 ENCOUNTER — Ambulatory Visit (INDEPENDENT_AMBULATORY_CARE_PROVIDER_SITE_OTHER): Payer: PPO | Admitting: Family Medicine

## 2015-05-30 VITALS — BP 128/70 | HR 97 | Temp 98.5°F | Resp 19 | Ht 65.0 in | Wt 216.0 lb

## 2015-05-30 DIAGNOSIS — G4700 Insomnia, unspecified: Secondary | ICD-10-CM | POA: Diagnosis not present

## 2015-05-30 DIAGNOSIS — F5104 Psychophysiologic insomnia: Secondary | ICD-10-CM | POA: Insufficient documentation

## 2015-05-30 MED ORDER — ESZOPICLONE 2 MG PO TABS: 2.0000 mg | ORAL_TABLET | Freq: Every day | ORAL | Status: DC

## 2015-05-30 NOTE — Progress Notes (Signed)
Name: Crystal Haas   MRN: [id number]    DOB: 05-17-1946   Date:05/30/2015       Progress Note  Subjective  Chief Complain  Chief Complaint  Patient presents with  . Follow-up    3 mo.  . Hypertension  . Hyperlipidemia  . Medication Refill    HPI  Pt. Is here for follow up of Insomnia, currently on Lunesta 2 mg at bedtime. Pt. often has to take Tylenol PM along with Lunesta to help her fall asleep. No side effects reported.  Past Medical History  Diagnosis Date  . Heart murmur     mild - followed by PCP  . GERD (gastroesophageal reflux disease)   . Anemia     distant past  . Depression   . Arthritis     "everywhere" - big toes worst  . Knee pain   . Sleep apnea     uses CPAP SS - [facility] 2013  . Motion sickness     back seat of car  . New onset a-fib     Past Surgical History  Procedure Laterality Date  . Retinal detachment surgery    . Tubal ligation    . Cataract extraction w/phaco Left 05/03/2015    Procedure: CATARACT EXTRACTION PHACO AND INTRAOCULAR LENS PLACEMENT (IOC);  Surgeon: Lockie Mola, MD;  [facility];  Service: Ophthalmology;  Laterality: Left;  CPAP    Family History  Problem Relation Age of Onset  . Family history unknown: Yes    History   Social History  . Marital Status: Widowed    Spouse Name: N/A  . Number of Children: N/A  . Years of Education: N/A   Occupational History  . Not on file.   Social History Main Topics  . Smoking status: Former Smoker    Quit date: 10/21/1969  . Smokeless tobacco: Not on file  . Alcohol Use: 4.2 oz/week    7 Shots of liquor per week  . Drug Use: Not on file  . Sexual Activity: Not on file   Other Topics Concern  . Not on file   Social History Narrative     Current outpatient prescriptions:  .  diphenhydramine-acetaminophen (TYLENOL PM) 25-500 MG TABS, Take 1 tablet by mouth at bedtime as needed., Disp: , Rfl:  .  DUREZOL 0.05 % EMUL, , Disp: , Rfl: 0 .  eszopiclone  (LUNESTA) 1 MG TABS tablet, Take by mouth., Disp: , Rfl:  .  fexofenadine (ALLEGRA) 180 MG tablet, Take 180 mg by mouth daily. PM, Disp: , Rfl:  .  glucosamine-chondroitin 500-400 MG tablet, Take 1 tablet by mouth daily. PM, Disp: , Rfl:  .  ketorolac (ACULAR) 0.4 % SOLN, , Disp: , Rfl: 0 .  Multiple Vitamins-Minerals (CENTRUM PO), Take by mouth. AM, Disp: , Rfl:  .  NAPHAZOLINE-PHENIRAMINE OP, Apply to eye., Disp: , Rfl:  .  PARoxetine (PAXIL) 20 MG tablet, Take 20 mg by mouth daily. AM, Disp: , Rfl:  .  VIGAMOX 0.5 % ophthalmic solution, , Disp: , Rfl: 0 .  eszopiclone (LUNESTA) 2 MG TABS tablet, TK 1 T PO HS, Disp: , Rfl: 2  Allergies  Allergen Reactions  . Apple Anaphylaxis    Throat swells but subsides with po benadry  . Daucus Carota Anaphylaxis    Peeling of carrot only but subsides with po benadry  . Ivp Dye [Iodinated Diagnostic Agents] Shortness Of Breath    Also swelling.  Topical betadine is OK.  Marland Kitchen  Other Swelling    Raw fruits cause throat to swell. Topical contact with carrots cause hands to swell, ok to eat.  . Strawberry Anaphylaxis    Throat swells but subsides with benadryl  . Iodine Swelling    IV   . Latex Swelling    Hands swelled after wearing gloves at fruit stand.   . Tape Other (See Comments)    Most tapes case raw skin.  Paper tape is OK.     Review of Systems  Psychiatric/Behavioral: Negative for depression. The patient has insomnia. The patient is not nervous/anxious.       Objective  Filed Vitals:   05/30/15 1346  BP: 128/70  Pulse: 97  Temp: 98.5 F (36.9 C)  TempSrc: Oral  Resp: 19  Height:  (1.651 m)  Weight: 216 lb (97.977 kg)  SpO2: 96%    Physical Exam  Constitutional: She is oriented to person, place, and time and well-developed, well-nourished, and in no distress.  Cardiovascular: Normal rate and regular rhythm.   Pulmonary/Chest: Effort normal and breath sounds normal.  Neurological: She is alert and oriented to person,  place, and time.  Psychiatric: Memory, affect and judgment normal.  Nursing note and vitals reviewed.     Assessment & Plan 1. Chronic insomnia Symptoms responsive to therapy. Continue present management. - eszopiclone (LUNESTA) 2 MG TABS tablet; Take 1 tablet (2 mg total) by mouth at bedtime. Take immediately before bedtime  Dispense: 30 tablet; Refill: 2   Retia Cordle Asad A. Faylene Kurtz [facility] 05/30/2015 2:06 PM

## 2015-06-15 ENCOUNTER — Encounter: Payer: Self-pay | Admitting: Cardiovascular Disease

## 2015-06-15 ENCOUNTER — Ambulatory Visit (INDEPENDENT_AMBULATORY_CARE_PROVIDER_SITE_OTHER): Payer: PPO | Admitting: Cardiovascular Disease

## 2015-06-15 VITALS — BP 157/108 | HR 83 | Ht 65.0 in | Wt 217.2 lb

## 2015-06-15 DIAGNOSIS — I4891 Unspecified atrial fibrillation: Secondary | ICD-10-CM

## 2015-06-15 NOTE — Progress Notes (Signed)
Primary care physician: Dr. Sherryll Burger  HPI  This is a pleasant 69 year old female who is here today for a follow-up visit regarding recently diagnosed atrial fibrillation. She has no previous history of hypertension, diabetes, stroke or heart failure.  She had atrial fibrillation diagnosed after cataract surgery. She has been in A. fib since then. Ventricular rate has been well-controlled without medications. She underwent an echocardiogram which showed normal LV systolic function with mildly dilated right and left atrium. I started her on anticoagulation with Eliquis. However, she reports that the cost is going to be $350 a month and she cannot afford it.   Allergies  Allergen Reactions  . Apple Anaphylaxis    Throat swells but subsides with po benadry  . Daucus Carota Anaphylaxis    Peeling of carrot only but subsides with po benadry  . Ivp Dye [Iodinated Diagnostic Agents] Shortness Of Breath    Also swelling.  Topical betadine is OK.  . Other Swelling    Raw fruits cause throat to swell. Topical contact with carrots cause hands to swell, ok to eat.  . Strawberry Anaphylaxis    Throat swells but subsides with benadryl  . Iodine Swelling    IV   . Latex Swelling    Hands swelled after wearing gloves at fruit stand.   . Tape Other (See Comments)    Most tapes case raw skin.  Paper tape is OK.     Current Outpatient Prescriptions on File Prior to Visit  Medication Sig Dispense Refill  . diphenhydramine-acetaminophen (TYLENOL PM) 25-500 MG TABS Take 1 tablet by mouth at bedtime as needed.    . DUREZOL 0.05 % EMUL   0  . eszopiclone (LUNESTA) 2 MG TABS tablet Take 1 tablet (2 mg total) by mouth at bedtime. Take immediately before bedtime 30 tablet 2  . fexofenadine (ALLEGRA) 180 MG tablet Take 180 mg by mouth daily. PM    . glucosamine-chondroitin 500-400 MG tablet Take 1 tablet by mouth daily. PM    . Multiple Vitamins-Minerals (CENTRUM PO) Take by mouth. AM    . PARoxetine (PAXIL)  20 MG tablet Take 20 mg by mouth daily. AM    . VIGAMOX 0.5 % ophthalmic solution   0   No current facility-administered medications on file prior to visit.     Past Medical History  Diagnosis Date  . Heart murmur     mild - followed by PCP  . GERD (gastroesophageal reflux disease)   . Anemia     distant past  . Depression   . Arthritis     "everywhere" - big toes worst  . Knee pain   . Sleep apnea     uses CPAP SS - [facility] 2013  . Motion sickness     back seat of car  . New onset a-fib      Past Surgical History  Procedure Laterality Date  . Retinal detachment surgery    . Tubal ligation    . Cataract extraction w/phaco Left 05/03/2015    Procedure: CATARACT EXTRACTION PHACO AND INTRAOCULAR LENS PLACEMENT (IOC);  Surgeon: Lockie Mola, MD;  Location: [facility];  Service: Ophthalmology;  Laterality: Left;  CPAP     Family History  Problem Relation Age of Onset  . Family history unknown: Yes     Social History   Social History  . Marital Status: Widowed    Spouse Name: N/A  . Number of Children: N/A  . Years of Education: N/A  Occupational History  . Not on file.   Social History Main Topics  . Smoking status: Former Smoker    Quit date: 10/21/1969  . Smokeless tobacco: Not on file  . Alcohol Use: 4.2 oz/week    7 Shots of liquor per week  . Drug Use: Not on file  . Sexual Activity: Not on file   Other Topics Concern  . Not on file   Social History Narrative     ROS A 10 point review of system was performed. It is negative other than that mentioned in the history of present illness.   PHYSICAL EXAM   BP 157/108 mmHg  Pulse 83  Ht 5\' 5"  (1.651 m)  Wt 217 lb 4 oz (98.544 kg)  BMI 36.15 kg/m2 Constitutional: She is oriented to person, place, and time. She appears well-developed and well-nourished. No distress.  HENT: No nasal discharge.  Head: Normocephalic and atraumatic.  Eyes: Pupils are equal and round. No discharge.    Neck: Normal range of motion. Neck supple. No JVD present. No thyromegaly present.  Cardiovascular: Normal rate, irregular rhythm, normal heart sounds. Exam reveals no gallop and no friction rub. No murmur heard.  Pulmonary/Chest: Effort normal and breath sounds normal. No stridor. No respiratory distress. She has no wheezes. She has no rales. She exhibits no tenderness.  Abdominal: Soft. Bowel sounds are normal. She exhibits no distension. There is no tenderness. There is no rebound and no guarding.  Musculoskeletal: Normal range of motion. She exhibits no edema and no tenderness.  Neurological: She is alert and oriented to person, place, and time. Coordination normal.  Skin: Skin is warm and dry. No rash noted. She is not diaphoretic. No erythema. No pallor.  Psychiatric: She has a normal mood and affect. Her behavior is normal. Judgment and thought content normal.     VQQ:VZDGLO fibrillation  Low voltage -possible pulmonary disease.   ABNORMAL    ASSESSMENT AND PLAN

## 2015-06-15 NOTE — Assessment & Plan Note (Signed)
She is completely asymptomatic. Ventricular rate seems to be controlled without medications. CHADS VASc score is 2. Thus, I recommend continuing anticoagulation. She reports inability to afford Eliquis.  I asked her to check with her insurance company about other agents. I also discussed with her the option of anticoagulation with warfarin. However, she reports intolerance to fingersticks and doesn't want to go that route. She is highly considering taking aspirin only. I had a prolonged discussion with her that this is an overall ineffective therapy to prevent thromboembolic events in patients with atrial fibrillation.

## 2015-06-15 NOTE — Patient Instructions (Signed)
Medication Instructions: Continue same medications.   Labwork: None.   Procedures/Testing: None.   Follow-Up: 3 months with Dr. Kirke Corin  Any Additional Special Instructions Will Be Listed Below (If Applicable).  Check with your insurance about the cost of the following blood thinners: Eliquis, Xarelto, Pradaxa and Savaysa

## 2015-06-21 ENCOUNTER — Telehealth: Payer: Self-pay | Admitting: Family Medicine

## 2015-06-21 DIAGNOSIS — F32A Depression, unspecified: Secondary | ICD-10-CM

## 2015-06-21 DIAGNOSIS — F329 Major depressive disorder, single episode, unspecified: Secondary | ICD-10-CM

## 2015-06-21 MED ORDER — PAROXETINE HCL 20 MG PO TABS: 20.0000 mg | ORAL_TABLET | Freq: Every day | ORAL | Status: DC

## 2015-06-21 NOTE — Telephone Encounter (Signed)
Routed to Dr. Shah for approval 

## 2015-06-21 NOTE — Telephone Encounter (Signed)
Prescription for Paxil 20 mg daily is sent to patient's pharmacy.

## 2015-06-21 NOTE — Telephone Encounter (Signed)
Completely out of Paxil. She was given a few to last until the prescription was filled but now she is out again. Stated that the pharmacy has sent over a request but has not received a response. Please send to pharmacy. Please call once completed.

## 2015-06-22 NOTE — Telephone Encounter (Signed)
Per Dr. Sherryll Burger medication has been refilled and sent to Pharmacy

## 2015-06-23 ENCOUNTER — Other Ambulatory Visit: Payer: Self-pay

## 2015-06-23 MED ORDER — APIXABAN 5 MG PO TABS: 5.0000 mg | ORAL_TABLET | Freq: Every day | ORAL | Status: DC

## 2015-06-23 NOTE — Telephone Encounter (Signed)
New Rx written for Eliquis 5 mg one tablet daily to send to patient assistance.

## 2015-06-23 NOTE — Telephone Encounter (Signed)
Patient also needed a written Rx for Eliquis 5 mg 30 day free trial to take to local pharmacy.

## 2015-06-27 ENCOUNTER — Other Ambulatory Visit: Payer: Self-pay

## 2015-06-27 ENCOUNTER — Telehealth: Payer: Self-pay

## 2015-06-27 MED ORDER — APIXABAN 5 MG PO TABS: 5.0000 mg | ORAL_TABLET | Freq: Two times a day (BID) | ORAL | Status: DC

## 2015-06-27 NOTE — Telephone Encounter (Signed)
S/w pt who states she picked up paperwork at our office for patient assistance for Eliquis and it was noted to take once per day. Per MD notes, eliquis  BID. States she will bring paperwork back to [place] for correction. New prescription sent to pharmacy

## 2015-06-27 NOTE — Telephone Encounter (Signed)
Pt returned to office for assistance with Eliquis Patient Assistance paperwork. Copy of prescription given to pt to submit  Pt had no further questions.

## 2015-06-27 NOTE — Telephone Encounter (Signed)
Pt states her Rx for Eliquis was written for 1 per day, and she needs 2 a day. Please call.

## 2015-08-24 ENCOUNTER — Telehealth: Payer: Self-pay | Admitting: Family Medicine

## 2015-08-24 NOTE — Telephone Encounter (Signed)
Pt needs her supplies for her C -Pap. [facility]. Pt is out and has been for 3 weeks. [phone]

## 2015-08-25 NOTE — Telephone Encounter (Signed)
Notified patient that [facility] spoke with [facility] and once form has been faxed supplies will be sent

## 2015-08-30 ENCOUNTER — Encounter: Payer: Self-pay | Admitting: Family Medicine

## 2015-08-30 ENCOUNTER — Ambulatory Visit (INDEPENDENT_AMBULATORY_CARE_PROVIDER_SITE_OTHER): Payer: PPO | Admitting: Family Medicine

## 2015-08-30 ENCOUNTER — Telehealth: Payer: Self-pay

## 2015-08-30 VITALS — BP 122/64 | HR 85 | Temp 98.5°F | Resp 18 | Ht 65.0 in | Wt 227.1 lb

## 2015-08-30 DIAGNOSIS — G4730 Sleep apnea, unspecified: Secondary | ICD-10-CM | POA: Insufficient documentation

## 2015-08-30 DIAGNOSIS — Z23 Encounter for immunization: Secondary | ICD-10-CM

## 2015-08-30 DIAGNOSIS — F329 Major depressive disorder, single episode, unspecified: Secondary | ICD-10-CM

## 2015-08-30 DIAGNOSIS — F5104 Psychophysiologic insomnia: Secondary | ICD-10-CM

## 2015-08-30 DIAGNOSIS — F32A Depression, unspecified: Secondary | ICD-10-CM

## 2015-08-30 MED ORDER — PAROXETINE HCL 20 MG PO TABS
20.0000 mg | ORAL_TABLET | Freq: Every day | ORAL | Status: DC
Start: 2015-08-30 — End: 2016-01-26

## 2015-08-30 MED ORDER — ESZOPICLONE 2 MG PO TABS
2.0000 mg | ORAL_TABLET | Freq: Every day | ORAL | Status: DC
Start: 2015-08-30 — End: 2015-12-06

## 2015-08-30 NOTE — Progress Notes (Signed)
Name: Delene LollDiane Harlacher   MRN: [id number]    DOB: 02/28/1946   Date:08/30/2015       Progress Note  Subjective  Chief Complaint  Chief Complaint  Patient presents with  . Depression  . Atrial Fibrillation  . Insomnia  . Hypertension    HPI  Pt. Is here to review and reauthorize CPAP therapy. She has history [place] She snores at night, when she tries to sleep without the CPAP, her throat fills up with mucus and she wakes up gasping for air. Last Sleep study was 3 years ago.  Past Medical History  Diagnosis Date  . Heart murmur     mild - followed by PCP  . GERD (gastroesophageal reflux disease)   . Anemia     distant past  . Depression   . Arthritis     "everywhere" - big toes worst  . Knee pain   . Sleep apnea     uses CPAP SS - [facility] 2013  . Motion sickness     back seat of car  . New onset a-fib [facility](HCC)     Past Surgical History  Procedure Laterality Date  . Retinal detachment surgery    . Tubal ligation    . Cataract extraction w/phaco Left 05/03/2015    Procedure: CATARACT EXTRACTION PHACO AND INTRAOCULAR LENS PLACEMENT (IOC);  Surgeon: Lockie Molahadwick Brasington, MD;  [facility];  Service: Ophthalmology;  Laterality: Left;  CPAP    Family History  Problem Relation Age of Onset  . Family history unknown: Yes    Social History   Social History  . Marital Status: Widowed    Spouse Name: N/A  . Number of Children: N/A  . Years of Education: N/A   Occupational History  . Not on file.   Social History Main Topics  . Smoking status: Former Smoker    Quit date: 10/21/1969  . Smokeless tobacco: Not on file  . Alcohol Use: 4.2 oz/week    7 Shots of liquor per week  . Drug Use: Not on file  . Sexual Activity: Not on file   Other Topics Concern  . Not on file   Social History Narrative     Current outpatient prescriptions:  .  apixaban (ELIQUIS) 5 MG TABS tablet, Take 1 tablet (5 mg total) by mouth 2  (two) times daily., Disp: 60 tablet, Rfl: 3 .  diphenhydramine-acetaminophen (TYLENOL PM) 25-500 MG TABS, Take 1 tablet by mouth at bedtime as needed., Disp: , Rfl:  .  DUREZOL 0.05 % EMUL, , Disp: , Rfl: 0 .  eszopiclone (LUNESTA) 2 MG TABS tablet, Take 1 tablet (2 mg total) by mouth at bedtime. Take immediately before bedtime, Disp: 30 tablet, Rfl: 2 .  fexofenadine (ALLEGRA) 180 MG tablet, Take 180 mg by mouth daily. PM, Disp: , Rfl:  .  glucosamine-chondroitin 500-400 MG tablet, Take 1 tablet by mouth daily. PM, Disp: , Rfl:  .  mometasone (NASONEX) 50 MCG/ACT nasal spray, Place 2 sprays into the nose daily., Disp: , Rfl:  .  Multiple Vitamins-Minerals (CENTRUM PO), Take by mouth. AM, Disp: , Rfl:  .  PARoxetine (PAXIL) 20 MG tablet, Take 1 tablet (20 mg total) by mouth daily., Disp: 90 tablet, Rfl: 1 .  VIGAMOX 0.5 % ophthalmic solution, , Disp: , Rfl: 0  Allergies  Allergen Reactions  . Apple Anaphylaxis    Throat swells but subsides with po benadry  . Daucus Carota  Anaphylaxis    Peeling of carrot only but subsides with po benadry  . Ivp Dye [Iodinated Diagnostic Agents] Shortness Of Breath    Also swelling.  Topical betadine is OK.  . Other Swelling    Raw fruits cause throat to swell. Topical contact with carrots cause hands to swell, ok to eat.  . Strawberry (Diagnostic) Anaphylaxis    Throat swells but subsides with benadryl  . Strawberry Extract Anaphylaxis    Throat swells but subsides with benadryl  . Iodine Swelling    IV   . Latex Swelling    Hands swelled after wearing gloves at fruit stand.   . Tape Other (See Comments)    Most tapes case raw skin.  Paper tape is OK.     ROS    Objective  Filed Vitals:   08/30/15 1327  BP: 122/64  Pulse: 85  Temp: 98.5 F (36.9 C)  Resp: 18  Height:  (1.651 m)  Weight: 227 lb 1 oz (102.995 kg)  SpO2: 97%    Physical Exam  Constitutional: She is oriented to person, place, and time and well-developed,  well-nourished, and in no distress.  HENT:  Head: Normocephalic and atraumatic.  Cardiovascular: Normal rate, regular rhythm and normal heart sounds.   Pulmonary/Chest: Effort normal and breath sounds normal. She has no wheezes. She has no rales.  Neurological: She is alert and oriented to person, place, and time.  Psychiatric: Memory, affect and judgment normal.  Nursing note and vitals reviewed.  Assessment & Plan  1. Need for influenza vaccination  - Flu vaccine HIGH DOSE PF (Fluzone High dose)  2. Sleep apnea Certification of medical necessity for CPAP supplies is completed and signed. [facility].    Shyheem Whitham Asad A. [facility] 08/30/2015 1:42 PM

## 2015-08-30 NOTE — Telephone Encounter (Signed)
Medication has been refilled and sent to [facility] [place]  

## 2015-08-30 NOTE — Telephone Encounter (Signed)
Patient mentioned on her way out that she needed refills of her medication and an order for the CPAP supplies. Dr. Sherryll BurgerShah stated it was a form that he has to sign.

## 2015-09-04 ENCOUNTER — Ambulatory Visit (INDEPENDENT_AMBULATORY_CARE_PROVIDER_SITE_OTHER): Payer: PPO | Admitting: Family Medicine

## 2015-09-04 ENCOUNTER — Encounter: Payer: Self-pay | Admitting: Family Medicine

## 2015-09-04 VITALS — BP 124/80 | HR 98 | Temp 98.1°F | Resp 17 | Ht 65.0 in | Wt 224.4 lb

## 2015-09-04 DIAGNOSIS — Z0001 Encounter for general adult medical examination with abnormal findings: Secondary | ICD-10-CM | POA: Insufficient documentation

## 2015-09-04 DIAGNOSIS — R7989 Other specified abnormal findings of blood chemistry: Secondary | ICD-10-CM

## 2015-09-04 DIAGNOSIS — R748 Abnormal levels of other serum enzymes: Secondary | ICD-10-CM | POA: Diagnosis not present

## 2015-09-04 DIAGNOSIS — Z0000 Encounter for general adult medical examination without abnormal findings: Secondary | ICD-10-CM

## 2015-09-04 NOTE — Progress Notes (Signed)
Name: Keyondra Lagrand   MRN: [id number]    DOB: 09-Dec-1945   Date:09/04/2015       Progress Note  Subjective  Chief Complaint  Chief Complaint  Patient presents with  . Annual Exam    CPE w/ pap  . Hyperlipidemia  . Hypertension    HPI  Pt. Is here for Complete Physical Exam. Her colonoscopy was in 2015 and was normal. Mammogram in 2015 and was normal. Pap Smear was in 2014, and was normal. No history of abnormal Pap smears  Past Medical History  Diagnosis Date  . Heart murmur     mild - followed by PCP  . GERD (gastroesophageal reflux disease)   . Anemia     distant past  . Depression   . Arthritis     "everywhere" - big toes worst  . Knee pain   . Sleep apnea     uses CPAP SS - [facility] 2013  . Motion sickness     back seat of car  . New onset a-fib [facility])     Past Surgical History  Procedure Laterality Date  . Retinal detachment surgery    . Tubal ligation    . Cataract extraction w/phaco Left 05/03/2015    Procedure: CATARACT EXTRACTION PHACO AND INTRAOCULAR LENS PLACEMENT (IOC);  Surgeon: Lockie Mola, MD;  Location: [facility];  Service: Ophthalmology;  Laterality: Left;  CPAP    Family History  Problem Relation Age of Onset  . Family history unknown: Yes    Social History   Social History  . Marital Status: Widowed    Spouse Name: N/A  . Number of Children: N/A  . Years of Education: N/A   Occupational History  . Not on file.   Social History Main Topics  . Smoking status: Former Smoker    Quit date: 10/21/1969  . Smokeless tobacco: Not on file  . Alcohol Use: 4.2 oz/week    7 Shots of liquor per week  . Drug Use: Not on file  . Sexual Activity: Not on file   Other Topics Concern  . Not on file   Social History Narrative     Current outpatient prescriptions:  .  apixaban (ELIQUIS) 5 MG TABS tablet, Take 1 tablet (5 mg total) by mouth 2 (two) times daily., Disp: 60 tablet, Rfl: 3 .  diphenhydramine-acetaminophen (TYLENOL PM)  25-500 MG TABS, Take 1 tablet by mouth at bedtime as needed., Disp: , Rfl:  .  DUREZOL 0.05 % EMUL, , Disp: , Rfl: 0 .  eszopiclone (LUNESTA) 2 MG TABS tablet, Take 1 tablet (2 mg total) by mouth at bedtime. Take immediately before bedtime, Disp: 30 tablet, Rfl: 2 .  fexofenadine (ALLEGRA) 180 MG tablet, Take 180 mg by mouth daily. PM, Disp: , Rfl:  .  glucosamine-chondroitin 500-400 MG tablet, Take 1 tablet by mouth daily. PM, Disp: , Rfl:  .  mometasone (NASONEX) 50 MCG/ACT nasal spray, Place 2 sprays into the nose daily., Disp: , Rfl:  .  Multiple Vitamins-Minerals (CENTRUM PO), Take by mouth. AM, Disp: , Rfl:  .  PARoxetine (PAXIL) 20 MG tablet, Take 1 tablet (20 mg total) by mouth daily., Disp: 90 tablet, Rfl: 1 .  VIGAMOX 0.5 % ophthalmic solution, , Disp: , Rfl: 0  Allergies  Allergen Reactions  . Apple Anaphylaxis    Throat swells but subsides with po benadry  . Daucus Carota Anaphylaxis    Peeling of carrot only but subsides with po benadry  .  Ivp Dye [Iodinated Diagnostic Agents] Shortness Of Breath    Also swelling.  Topical betadine is OK.  . Other Swelling    Raw fruits cause throat to swell. Topical contact with carrots cause hands to swell, ok to eat.  . Strawberry (Diagnostic) Anaphylaxis    Throat swells but subsides with benadryl  . Strawberry Extract Anaphylaxis    Throat swells but subsides with benadryl  . Iodine Swelling    IV   . Latex Swelling    Hands swelled after wearing gloves at fruit stand.   . Tape Other (See Comments)    Most tapes case raw skin.  Paper tape is OK.     Review of Systems  Constitutional: Negative for fever, chills, weight loss and malaise/fatigue.  HENT: Negative for sore throat.   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  Respiratory: Negative for cough, sputum production, shortness of breath and stridor.    Cardiovascular: Negative for chest pain, palpitations and leg swelling.  Gastrointestinal: Negative for heartburn, nausea, vomiting, abdominal pain, diarrhea, constipation and blood in stool.  Genitourinary: Negative for dysuria, urgency, frequency and hematuria.  Musculoskeletal: Negative for myalgias, back pain, joint pain (right knee pain) and neck pain.  Skin: Negative for itching and rash.  Neurological: Negative for dizziness and headaches.  Endo/Heme/Allergies: Does not bruise/bleed easily.  Psychiatric/Behavioral: Negative for depression. The patient is nervous/anxious and has insomnia.       Objective  Filed Vitals:   09/04/15 0954  BP: 124/80  Pulse: 98  Temp: 98.1 F (36.7 C)  TempSrc: Oral  Resp: 17  Height: 5\' 5"  (1.651 m)  Weight: 224 lb 6.4 oz (101.787 kg)  SpO2: 97%    Physical Exam  Constitutional: She is oriented to person, place, and time and well-developed, well-nourished, and in no distress.  HENT:  Head: Normocephalic and atraumatic.  Eyes: Pupils are equal, round, and reactive to light.  Neck: Neck supple.  Cardiovascular: Normal rate, regular rhythm and normal heart sounds.   Pulmonary/Chest: Effort normal and breath sounds normal. She has no wheezes.  Clinical breast exam deferred.  Abdominal: Soft. Bowel sounds are normal.  Genitourinary:  Deferred  Musculoskeletal: Normal range of motion. She exhibits edema and tenderness.  Neurological: She is alert and oriented to person, place, and time.  Skin: Skin is warm and dry.  Psychiatric: Mood, memory, affect and judgment normal.  Nursing note and vitals reviewed.    Assessment & Plan   1. Annual physical exam  Obtain screening lab work including lipids, liver and kidney function, complete blood count, thyroid and vitamin D. Referral to obtain a screening mammogram. Pathology report from screening colonoscopy in 2014 reviewed. Pap smear not indicated at this time. Follow-up after review of  lab work  - Lipid Profile - CBC with Differential - Comprehensive Metabolic Panel (CMET) - TSH - Vitamin D (25 hydroxy) - MM Digital Screening; Future   Addie Alonge Asad A. Faylene KurtzShah [facility] 09/04/2015 10:24 AM

## 2015-09-05 ENCOUNTER — Telehealth: Payer: Self-pay | Admitting: Cardiovascular Disease

## 2015-09-05 LAB — CBC WITH DIFFERENTIAL/PLATELET
BASOS ABS: 0 10*3/uL (ref 0.0–0.2)
Basos: 1 %
EOS (ABSOLUTE): 0.2 10*3/uL (ref 0.0–0.4)
Eos: 3 %
Hematocrit: 39.9 % (ref 34.0–46.6)
Hemoglobin: 13.4 g/dL (ref 11.1–15.9)
Immature Grans (Abs): 0 10*3/uL (ref 0.0–0.1)
Immature Granulocytes: 0 %
LYMPHS: 20 %
Lymphocytes Absolute: 1.5 10*3/uL (ref 0.7–3.1)
MCH: 30.3 pg (ref 26.6–33.0)
MCHC: 33.6 g/dL (ref 31.5–35.7)
MCV: 90 fL (ref 79–97)
Monocytes Absolute: 1.1 10*3/uL — ABNORMAL HIGH (ref 0.1–0.9)
Monocytes: 14 %
Neutrophils Absolute: 4.5 10*3/uL (ref 1.4–7.0)
Neutrophils: 62 %
PLATELETS: 227 10*3/uL (ref 150–379)
RBC: 4.42 x10E6/uL (ref 3.77–5.28)
RDW: 14.4 % (ref 12.3–15.4)
WBC: 7.3 10*3/uL (ref 3.4–10.8)

## 2015-09-05 LAB — COMPREHENSIVE METABOLIC PANEL
ALT: 17 IU/L (ref 0–32)
AST: 14 IU/L (ref 0–40)
Albumin/Globulin Ratio: 2 (ref 1.1–2.5)
Albumin: 4.4 g/dL (ref 3.6–4.8)
Alkaline Phosphatase: 53 IU/L (ref 39–117)
BILIRUBIN TOTAL: 0.4 mg/dL (ref 0.0–1.2)
BUN/Creatinine Ratio: 15 (ref 11–26)
BUN: 15 mg/dL (ref 8–27)
CHLORIDE: 105 mmol/L (ref 97–106)
CO2: 27 mmol/L (ref 18–29)
Calcium: 9.4 mg/dL (ref 8.7–10.3)
Creatinine, Ser: 1.02 mg/dL — ABNORMAL HIGH (ref 0.57–1.00)
GFR calc non Af Amer: 57 mL/min/{1.73_m2} — ABNORMAL LOW (ref >59)
GFR, EST AFRICAN AMERICAN: 65 mL/min/{1.73_m2} (ref >59)
GLUCOSE: 102 mg/dL — AB (ref 65–99)
Globulin, Total: 2.2 g/dL (ref 1.5–4.5)
POTASSIUM: 5.9 mmol/L — AB (ref 3.5–5.2)
Sodium: 146 mmol/L — ABNORMAL HIGH (ref 136–144)
TOTAL PROTEIN: 6.6 g/dL (ref 6.0–8.5)

## 2015-09-05 LAB — LIPID PANEL
CHOL/HDL RATIO: 2.8 ratio (ref 0.0–4.4)
Cholesterol, Total: 186 mg/dL (ref 100–199)
HDL: 66 mg/dL (ref >39)
LDL CALC: 99 mg/dL (ref 0–99)
TRIGLYCERIDES: 103 mg/dL (ref 0–149)
VLDL CHOLESTEROL CAL: 21 mg/dL (ref 5–40)

## 2015-09-05 LAB — TSH: TSH: 3.37 u[IU]/mL (ref 0.450–4.500)

## 2015-09-05 LAB — VITAMIN D 25 HYDROXY (VIT D DEFICIENCY, FRACTURES): Vit D, 25-Hydroxy: 28.2 ng/mL — ABNORMAL LOW (ref 30.0–100.0)

## 2015-09-05 MED ORDER — APIXABAN 5 MG PO TABS: 5.0000 mg | ORAL_TABLET | Freq: Two times a day (BID) | ORAL | Status: DC

## 2015-09-05 NOTE — Telephone Encounter (Signed)
°*  STAT* If patient is at the pharmacy, call can be transferred to refill team.   1. Which medications need to be refilled?Eliquis 5 mg po twice daily   2. Which pharmacy/location (including street and city if local pharmacy) is medication to be sent to?  [facility] [place] [address]   3. Do they need a 30 day or 90 day supply? 30

## 2015-09-05 NOTE — Telephone Encounter (Signed)
Pt's Rx sent to pt's pharmacy that she requested. Confirmation received.

## 2015-09-07 NOTE — Addendum Note (Signed)
Addended bySherryll Burger: Moriah Loughry A A on: 09/07/2015 07:45 PM   Modules accepted: Orders

## 2015-09-11 ENCOUNTER — Ambulatory Visit (INDEPENDENT_AMBULATORY_CARE_PROVIDER_SITE_OTHER): Payer: PPO | Admitting: Cardiovascular Disease

## 2015-09-11 ENCOUNTER — Encounter: Payer: Self-pay | Admitting: Cardiovascular Disease

## 2015-09-11 VITALS — BP 130/68 | HR 85 | Ht 65.0 in | Wt 225.0 lb

## 2015-09-11 DIAGNOSIS — I482 Chronic atrial fibrillation, unspecified: Secondary | ICD-10-CM | POA: Insufficient documentation

## 2015-09-11 DIAGNOSIS — I4891 Unspecified atrial fibrillation: Secondary | ICD-10-CM

## 2015-09-11 NOTE — Patient Instructions (Signed)
Medication Instructions: Continue same medications.   Labwork: None.   Procedures/Testing: None.   Follow-Up: 6 months with Dr. Udell Mazzocco.   Any Additional Special Instructions Will Be Listed Below (If Applicable).   

## 2015-09-11 NOTE — Progress Notes (Signed)
Primary care physician: Dr. Sherryll BurgerShah  HPI  This is a pleasant 69 year old female who is here today for a follow-up visit regarding asymptomatic chronic atrial fibrillation. She has no previous history of hypertension, diabetes, stroke or heart failure.  Echocardiogram in July showed normal LV systolic function with mildly dilated right and left atrium.  She is tolerating anticoagulation with Eliquis.  It is covered by her insurance and currently the cost is $45 per month. She has been doing well and denies any chest pain, shortness of breath or palpitations.  Allergies  Allergen Reactions  . Apple Anaphylaxis    Throat swells but subsides with po benadry  . Daucus Carota Anaphylaxis    Peeling of carrot only but subsides with po benadry  . Ivp Dye [Iodinated Diagnostic Agents] Shortness Of Breath    Also swelling.  Topical betadine is OK.  . Other Swelling    Raw fruits cause throat to swell. Topical contact with carrots cause hands to swell, ok to eat.  . Strawberry (Diagnostic) Anaphylaxis    Throat swells but subsides with benadryl  . Strawberry Extract Anaphylaxis    Throat swells but subsides with benadryl  . Iodine Swelling    IV   . Latex Swelling    Hands swelled after wearing gloves at fruit stand.   . Tape Other (See Comments)    Most tapes case raw skin.  Paper tape is OK.     Current Outpatient Prescriptions on File Prior to Visit  Medication Sig Dispense Refill  . apixaban (ELIQUIS) 5 MG TABS tablet Take 1 tablet (5 mg total) by mouth 2 (two) times daily. 60 tablet 9  . diphenhydramine-acetaminophen (TYLENOL PM) 25-500 MG TABS Take 1 tablet by mouth at bedtime as needed.    . DUREZOL 0.05 % EMUL   0  . eszopiclone (LUNESTA) 2 MG TABS tablet Take 1 tablet (2 mg total) by mouth at bedtime. Take immediately before bedtime 30 tablet 2  . fexofenadine (ALLEGRA) 180 MG tablet Take 180 mg by mouth daily. PM    . glucosamine-chondroitin 500-400 MG tablet Take 1 tablet by mouth  daily. PM    . mometasone (NASONEX) 50 MCG/ACT nasal spray Place 2 sprays into the nose daily.    . Multiple Vitamins-Minerals (CENTRUM PO) Take by mouth. AM    . PARoxetine (PAXIL) 20 MG tablet Take 1 tablet (20 mg total) by mouth daily. 90 tablet 1  . VIGAMOX 0.5 % ophthalmic solution   0   No current facility-administered medications on file prior to visit.     Past Medical History  Diagnosis Date  . Heart murmur     mild - followed by PCP  . GERD (gastroesophageal reflux disease)   . Anemia     distant past  . Depression   . Arthritis     "everywhere" - big toes worst  . Knee pain   . Sleep apnea     uses CPAP SS - [facility] 2013  . Motion sickness     back seat of car  . New onset a-fib [facility](HCC)      Past Surgical History  Procedure Laterality Date  . Retinal detachment surgery    . Tubal ligation    . Cataract extraction w/phaco Left 05/03/2015    Procedure: CATARACT EXTRACTION PHACO AND INTRAOCULAR LENS PLACEMENT (IOC);  Surgeon: Lockie Molahadwick Brasington, MD;  [facility];  Service: Ophthalmology;  Laterality: Left;  CPAP     Family History  Problem  Relation Age of Onset  . Family history unknown: Yes     Social History   Social History  . Marital Status: Widowed    Spouse Name: N/A  . Number of Children: N/A  . Years of Education: N/A   Occupational History  . Not on file.   Social History Main Topics  . Smoking status: Former Smoker    Quit date: 10/21/1969  . Smokeless tobacco: Not on file  . Alcohol Use: 4.2 oz/week    7 Shots of liquor per week  . Drug Use: Not on file  . Sexual Activity: Not on file   Other Topics Concern  . Not on file   Social History Narrative     ROS A 10 point review of system was performed. It is negative other than that mentioned in the history of present illness.   PHYSICAL EXAM   BP 130/68 mmHg  Pulse 85  Ht  (1.651 m)  Wt 225 lb (102.059 kg)  BMI 37.44 kg/m2 Constitutional: She is  oriented to person, place, and time. She appears well-developed and well-nourished. No distress.  HENT: No nasal discharge.  Head: Normocephalic and atraumatic.  Eyes: Pupils are equal and round. No discharge.  Neck: Normal range of motion. Neck supple. No JVD present. No thyromegaly present.  Cardiovascular: Normal rate, irregular rhythm, normal heart sounds. Exam reveals no gallop and no friction rub. There is one out of 6 systolic ejection murmur in the aortic area..  Pulmonary/Chest: Effort normal and breath sounds normal. No stridor. No respiratory distress. She has no wheezes. She has no rales. She exhibits no tenderness.  Abdominal: Soft. Bowel sounds are normal. She exhibits no distension. There is no tenderness. There is no rebound and no guarding.  Musculoskeletal: Normal range of motion. She exhibits no edema and no tenderness.  Neurological: She is alert and oriented to person, place, and time. Coordination normal.  Skin: Skin is warm and dry. No rash noted. She is not diaphoretic. No erythema. No pallor.  Psychiatric: She has a normal mood and affect. Her behavior is normal. Judgment and thought content normal.     ZOX:WRUEAV fibrillation with PVCs Low voltage -possible pulmonary disease.   ABNORMAL    ASSESSMENT AND PLAN

## 2015-09-11 NOTE — Assessment & Plan Note (Signed)
Atrial fibrillation seems to be chronic at this point. Ventricular rate is controlled without any medications. She is tolerating anticoagulation with no side effects.

## 2015-09-12 ENCOUNTER — Other Ambulatory Visit: Payer: Self-pay | Admitting: Family Medicine

## 2015-09-12 DIAGNOSIS — R7989 Other specified abnormal findings of blood chemistry: Secondary | ICD-10-CM

## 2015-09-12 DIAGNOSIS — E875 Hyperkalemia: Secondary | ICD-10-CM | POA: Insufficient documentation

## 2015-10-26 DIAGNOSIS — J301 Allergic rhinitis due to pollen: Secondary | ICD-10-CM | POA: Diagnosis not present

## 2015-11-02 DIAGNOSIS — J301 Allergic rhinitis due to pollen: Secondary | ICD-10-CM | POA: Diagnosis not present

## 2015-11-09 DIAGNOSIS — J301 Allergic rhinitis due to pollen: Secondary | ICD-10-CM | POA: Diagnosis not present

## 2015-11-16 DIAGNOSIS — J301 Allergic rhinitis due to pollen: Secondary | ICD-10-CM | POA: Diagnosis not present

## 2015-11-20 DIAGNOSIS — J301 Allergic rhinitis due to pollen: Secondary | ICD-10-CM | POA: Diagnosis not present

## 2015-11-24 DIAGNOSIS — M7541 Impingement syndrome of right shoulder: Secondary | ICD-10-CM | POA: Diagnosis not present

## 2015-11-24 DIAGNOSIS — M1711 Unilateral primary osteoarthritis, right knee: Secondary | ICD-10-CM | POA: Diagnosis not present

## 2015-11-30 DIAGNOSIS — J301 Allergic rhinitis due to pollen: Secondary | ICD-10-CM | POA: Diagnosis not present

## 2015-12-04 DIAGNOSIS — J301 Allergic rhinitis due to pollen: Secondary | ICD-10-CM | POA: Diagnosis not present

## 2015-12-06 ENCOUNTER — Encounter: Payer: Self-pay | Admitting: Family Medicine

## 2015-12-06 ENCOUNTER — Ambulatory Visit (INDEPENDENT_AMBULATORY_CARE_PROVIDER_SITE_OTHER): Payer: PPO | Admitting: Family Medicine

## 2015-12-06 VITALS — BP 124/72 | HR 74 | Temp 97.8°F | Resp 18 | Ht 65.0 in | Wt 229.1 lb

## 2015-12-06 DIAGNOSIS — G4700 Insomnia, unspecified: Secondary | ICD-10-CM

## 2015-12-06 DIAGNOSIS — F5104 Psychophysiologic insomnia: Secondary | ICD-10-CM

## 2015-12-06 MED ORDER — ESZOPICLONE 2 MG PO TABS: 2.0000 mg | ORAL_TABLET | Freq: Every day | ORAL | Status: DC

## 2015-12-06 NOTE — Progress Notes (Signed)
Name: Crystal Haas   MRN: [id number]    DOB: [birth year]   Date:12/06/2015       Progress Note  Subjective  Chief Complaint  Chief Complaint  Patient presents with  . Insomnia    pt here for 3 month follow up  . Depression    Insomnia Primary symptoms: sleep disturbance, difficulty falling asleep, premature morning awakening.  Past treatments include medication. Typical bedtime:  10-11 P.M..  How long after going to bed to you fall asleep: 30-60 minutes.       Past Medical History  Diagnosis Date  . Heart murmur     mild - followed by PCP  . GERD (gastroesophageal reflux disease)   . Anemia     distant past  . Depression   . Arthritis     "everywhere" - big toes worst  . Knee pain   . Sleep apnea     uses CPAP SS - [facility] 2013  . Motion sickness     back seat of car  . New onset a-fib [facility])     Past Surgical History  Procedure Laterality Date  . Retinal detachment surgery    . Tubal ligation    . Cataract extraction w/phaco Left 05/03/2015    Procedure: CATARACT EXTRACTION PHACO AND INTRAOCULAR LENS PLACEMENT (IOC);  Surgeon: Lockie Mola, MD;  Location: [facility];  Service: Ophthalmology;  Laterality: Left;  CPAP    Family History  Problem Relation Age of Onset  . Family history unknown: Yes    Social History   Social History  . Marital Status: Widowed    Spouse Name: N/A  . Number of Children: N/A  . Years of Education: N/A   Occupational History  . Not on file.   Social History Main Topics  . Smoking status: Former Smoker    Quit date: 10/21/1969  . Smokeless tobacco: Not on file  . Alcohol Use: 4.2 oz/week    7 Shots of liquor per week  . Drug Use: Not on file  . Sexual Activity: Not on file   Other Topics Concern  . Not on file   Social History Narrative     Current outpatient prescriptions:  .  apixaban (ELIQUIS) 5 MG TABS tablet, Take 1 tablet (5 mg total) by mouth 2 (two) times daily., Disp: 60 tablet, Rfl: 9 .   cholecalciferol (VITAMIN D) 1000 UNITS tablet, Take 2,000 Units by mouth daily., Disp: , Rfl:  .  diphenhydramine-acetaminophen (TYLENOL PM) 25-500 MG TABS, Take 1 tablet by mouth at bedtime as needed., Disp: , Rfl:  .  DUREZOL 0.05 % EMUL, , Disp: , Rfl: 0 .  eszopiclone (LUNESTA) 2 MG TABS tablet, Take 1 tablet (2 mg total) by mouth at bedtime. Take immediately before bedtime, Disp: 30 tablet, Rfl: 2 .  fexofenadine (ALLEGRA) 180 MG tablet, Take 180 mg by mouth daily. PM, Disp: , Rfl:  .  glucosamine-chondroitin 500-400 MG tablet, Take 1 tablet by mouth daily. PM, Disp: , Rfl:  .  mometasone (NASONEX) 50 MCG/ACT nasal spray, Place 2 sprays into the nose daily., Disp: , Rfl:  .  Multiple Vitamins-Minerals (CENTRUM PO), Take by mouth. AM, Disp: , Rfl:  .  PARoxetine (PAXIL) 20 MG tablet, Take 1 tablet (20 mg total) by mouth daily., Disp: 90 tablet, Rfl: 1 .  VIGAMOX 0.5 % ophthalmic solution, , Disp: , Rfl: 0  Allergies  Allergen Reactions  . Apple Anaphylaxis    Throat swells but subsides with po  benadry  . Daucus Carota Anaphylaxis    Peeling of carrot only but subsides with po benadry  . Ivp Dye [Iodinated Diagnostic Agents] Shortness Of Breath    Also swelling.  Topical betadine is OK.  . Other Swelling    Raw fruits cause throat to swell. Topical contact with carrots cause hands to swell, ok to eat.  . Strawberry (Diagnostic) Anaphylaxis    Throat swells but subsides with benadryl  . Strawberry Extract Anaphylaxis    Throat swells but subsides with benadryl  . Iodine Swelling    IV   . Latex Swelling    Hands swelled after wearing gloves at fruit stand.   . Tape Other (See Comments)    Most tapes case raw skin.  Paper tape is OK.     Review of Systems  Psychiatric/Behavioral: Positive for sleep disturbance.     Objective  Filed Vitals:   12/06/15 1337  BP: 124/72  Pulse: 74  Temp: 97.8 F (36.6 C)  Resp: 18  Height:  (1.651 m)  Weight: 229 lb 2 oz (103.93  kg)  SpO2: 95%    Physical Exam  Constitutional: She is oriented to person, place, and time and well-developed, well-nourished, and in no distress.  Cardiovascular: Normal rate and regular rhythm.   Pulmonary/Chest: Effort normal and breath sounds normal.  Neurological: She is alert and oriented to person, place, and time.  Psychiatric: Mood, memory, affect and judgment normal.  Nursing note and vitals reviewed.      Assessment & Plan  1. Chronic insomnia Stable on Lunesta 2 mg at bedtime, refills provided. - eszopiclone (LUNESTA) 2 MG TABS tablet; Take 1 tablet (2 mg total) by mouth at bedtime. Take immediately before bedtime  Dispense: 30 tablet; Refill: 0   Karryn Kosinski [facility] 12/06/2015 1:48 PM

## 2015-12-13 DIAGNOSIS — M25561 Pain in right knee: Secondary | ICD-10-CM | POA: Diagnosis not present

## 2015-12-13 DIAGNOSIS — S8001XA Contusion of right knee, initial encounter: Secondary | ICD-10-CM | POA: Diagnosis not present

## 2015-12-14 DIAGNOSIS — J301 Allergic rhinitis due to pollen: Secondary | ICD-10-CM | POA: Diagnosis not present

## 2015-12-19 ENCOUNTER — Telehealth: Payer: Self-pay

## 2015-12-19 DIAGNOSIS — F5104 Psychophysiologic insomnia: Secondary | ICD-10-CM

## 2015-12-19 NOTE — Telephone Encounter (Signed)
Spoke with pharmacy and patient has picked up medication

## 2015-12-21 DIAGNOSIS — J301 Allergic rhinitis due to pollen: Secondary | ICD-10-CM | POA: Diagnosis not present

## 2015-12-25 DIAGNOSIS — J301 Allergic rhinitis due to pollen: Secondary | ICD-10-CM | POA: Diagnosis not present

## 2015-12-28 ENCOUNTER — Ambulatory Visit
Admission: RE | Admit: 2015-12-28 | Discharge: 2015-12-28 | Disposition: A | Discharge status: Home / Self Care | Payer: PPO | Source: Ambulatory Visit | Attending: Family Medicine | Admitting: Family Medicine

## 2015-12-28 ENCOUNTER — Encounter: Payer: Self-pay | Admitting: Family Medicine

## 2015-12-28 ENCOUNTER — Ambulatory Visit (INDEPENDENT_AMBULATORY_CARE_PROVIDER_SITE_OTHER): Payer: PPO | Admitting: Family Medicine

## 2015-12-28 VITALS — BP 126/84 | HR 60 | Temp 97.7°F | Ht 65.0 in | Wt 234.2 lb

## 2015-12-28 DIAGNOSIS — M79671 Pain in right foot: Secondary | ICD-10-CM | POA: Diagnosis not present

## 2015-12-28 DIAGNOSIS — M25571 Pain in right ankle and joints of right foot: Secondary | ICD-10-CM

## 2015-12-28 DIAGNOSIS — S99921A Unspecified injury of right foot, initial encounter: Secondary | ICD-10-CM | POA: Insufficient documentation

## 2015-12-28 DIAGNOSIS — S9001XA Contusion of right ankle, initial encounter: Secondary | ICD-10-CM | POA: Diagnosis not present

## 2015-12-28 DIAGNOSIS — S9031XA Contusion of right foot, initial encounter: Secondary | ICD-10-CM | POA: Diagnosis not present

## 2015-12-28 NOTE — Patient Instructions (Signed)
Nice to meet you. We are going to obtain x-rays of her right ankle and foot to evaluate following her injury. If you develop increasing pain, numbness, weakness, tingling, increased swelling, or any new or changing symptoms please seek medical attention.

## 2015-12-28 NOTE — Progress Notes (Signed)
Patient ID: Crystal Haas, female   DOB: July 02, 1946, 70 y.o.   MRN: [id number]  Marikay Alar, MD Phone: [phone]  Crystal Haas is a 70 y.o. female who presents today for new patient visit.  Right foot pain: Patient notes 3 weeks ago she was at a friend's house and got her foot caught on a rug and fell and landed on her right knee and twisted her right foot and ankle. She was evaluated by an orthopedic physician who the patient reports did an x-ray of her right knee though did not do an x-ray of her foot. She reports he did evaluate her foot by physical exam. She notes the lateral aspect of her right knee still intermittently bothers her with shooting discomfort. She notes the mid and forefoot on the right side is still swollen and uncomfortable intermittently. She denies numbness and weakness. She notes she's been taking tramadol 50 mg at night for this with minimal benefit.  Active Ambulatory Problems    Diagnosis Date Noted  . New onset atrial fibrillation (HCC) 05/03/2015  . Chronic insomnia 05/30/2015  . Depression 06/21/2015  . Sleep apnea 08/30/2015  . Annual physical exam 09/04/2015  . Elevated serum creatinine 09/07/2015  . Chronic atrial fibrillation (HCC) 09/11/2015  . Hyperkalemia 09/12/2015  . Right foot injury 12/28/2015   Resolved Ambulatory Problems    Diagnosis Date Noted  . No Resolved Ambulatory Problems   Past Medical History  Diagnosis Date  . Heart murmur   . GERD (gastroesophageal reflux disease)   . Anemia   . Arthritis   . Knee pain   . Motion sickness   . New onset a-fib [facility])     Family History  Problem Relation Age of Onset  . Alcoholism    . Heart disease      Social History   Social History  . Marital Status: Widowed    Spouse Name: N/A  . Number of Children: N/A  . Years of Education: N/A   Occupational History  . Not on file.   Social History Main Topics  . Smoking status: Former Smoker    Quit date: 10/21/1969  . Smokeless  tobacco: Not on file  . Alcohol Use: 4.2 oz/week    7 Shots of liquor per week  . Drug Use: No  . Sexual Activity: Not on file   Other Topics Concern  . Not on file   Social History Narrative    ROS  General:  Negative for nexplained weight loss, fever Skin: Negative for new or changing mole, sore that won't heal HEENT: Negative for trouble hearing, trouble seeing, ringing in ears, mouth sores, hoarseness, change in voice, dysphagia. CV:  Negative for chest pain, dyspnea, edema, palpitations Resp: Negative for cough, dyspnea, hemoptysis GI: Negative for nausea, vomiting, diarrhea, constipation, abdominal pain, melena, hematochezia. GU: Positive for stress incontinence (chronic issue unchanged in many years), Negative for dysuria, urinary hesitance, hematuria, vaginal or penile discharge, polyuria, sexual difficulty, lumps in testicle or breasts MSK: Positive for joint pain, Negative for muscle cramps or aches, swelling Neuro: Negative for headaches, weakness, numbness, dizziness, passing out/fainting Psych: Negative for depression, anxiety, memory problems  Objective  Physical Exam Filed Vitals:   12/28/15 1416  BP: 126/84  Pulse: 60  Temp: 97.7 F (36.5 C)    BP Readings from Last 3 Encounters:  12/28/15 126/84  12/06/15 124/72  09/11/15 130/68   Wt Readings from Last 3 Encounters:  12/28/15 234 lb 3.2 oz (106.232 kg)  12/06/15  229 lb 2 oz (103.93 kg)  09/11/15 225 lb (102.059 kg)    Physical Exam  Constitutional: She is well-developed, well-nourished, and in no distress.  HENT:  Head: Normocephalic and atraumatic.  Right Ear: External ear normal.  Left Ear: External ear normal.  Mouth/Throat: Oropharynx is clear and moist. No oropharyngeal exudate.  Eyes: Conjunctivae are normal. Pupils are equal, round, and reactive to light.  Neck: Neck supple.  Cardiovascular: Normal heart sounds.  Exam reveals no gallop and no friction rub.   No murmur  heard. Irregularly irregular, rate controlled  Pulmonary/Chest: Effort normal and breath sounds normal. No respiratory distress. She has no wheezes. She has no rales.  Abdominal: Soft. Bowel sounds are normal. She exhibits no distension. There is no tenderness. There is no rebound and no guarding.  Musculoskeletal:  Right knee with no effusion or swelling, there is tenderness along the lateral aspect though no skin changes, no joint line tenderness, no specific bony tenderness, right foot with swelling on the dorsal aspect with bruising, toes appear bruised as well, mild tenderness over the dorsal aspect of her foot and lateral malleolus, sensation to light touch intact in bilateral feet, left knee and ankle and foot with normal exam, difficult to appreciate DP pulse in right foot given swelling, feet are well perfused with good capillary refill, 2+ left DP pulse  Lymphadenopathy:    She has no cervical adenopathy.  Neurological: She is alert.  5 out of 5 strength bilateral quads, hamstrings, plantar flexion, dorsiflexion, inversion, and eversion, sensation light touch intact bilateral lower extremities  Skin: Skin is warm and dry. She is not diaphoretic.  Psychiatric: Mood and affect normal.     Assessment/Plan:   Right foot injury Patient with injury to her right foot and knee several weeks ago. Previously evaluated by orthopedics. Patient reports negative x-ray imaging of her right knee. No imaging of her right foot. Persistent pain in her right foot and knee. Suspect inability to feel right DP pulse is related to swelling from injury as she has good capillary refill. We'll request records from the orthopedics office. We will obtain an x-ray of her foot and right ankle to evaluate for fracture given persistence of pain and swelling. Once x-ray returns we will determine the next step in our course of action. She's given return precautions.    Orders Placed This Encounter  Procedures  . DG  Ankle Complete Right    Standing Status: Future     Number of Occurrences: 1     Standing Expiration Date: 02/26/2017    Order Specific Question:  Reason for Exam (SYMPTOM  OR DIAGNOSIS REQUIRED)    Answer:  right ankle pain and foot swelling s/p fall with injury to foot, TTP over lateral maleolus    Order Specific Question:  Preferred imaging location?    Answer:  [facility]  . DG Foot Complete Right    Standing Status: Future     Number of Occurrences: 1     Standing Expiration Date: 02/26/2017    Order Specific Question:  Reason for Exam (SYMPTOM  OR DIAGNOSIS REQUIRED)    Answer:  right ankle pain and foot swelling s/p fall with injury to foot, TTP over lateral maleolus    Order Specific Question:  Preferred imaging location?    Answer:  [facility]    No orders of the defined types were placed in this encounter.     Marikay AlarEric Cadi Rhinehart, MD [facility]

## 2015-12-28 NOTE — Assessment & Plan Note (Signed)
Patient with injury to her right foot and knee several weeks ago. Previously evaluated by orthopedics. Patient reports negative x-ray imaging of her right knee. No imaging of her right foot. Persistent pain in her right foot and knee. Suspect inability to feel right DP pulse is related to swelling from injury as she has good capillary refill. We'll request records from the orthopedics office. We will obtain an x-ray of her foot and right ankle to evaluate for fracture given persistence of pain and swelling. Once x-ray returns we will determine the next step in our course of action. She's given return precautions.

## 2015-12-28 NOTE — Progress Notes (Signed)
Pre visit review using our clinic review tool, if applicable. No additional management support is needed unless otherwise documented below in the visit note. 

## 2016-01-01 DIAGNOSIS — J301 Allergic rhinitis due to pollen: Secondary | ICD-10-CM | POA: Diagnosis not present

## 2016-01-05 ENCOUNTER — Encounter: Payer: Self-pay | Admitting: Family Medicine

## 2016-01-15 DIAGNOSIS — J301 Allergic rhinitis due to pollen: Secondary | ICD-10-CM | POA: Diagnosis not present

## 2016-01-16 ENCOUNTER — Other Ambulatory Visit: Payer: Self-pay | Admitting: Family Medicine

## 2016-01-17 ENCOUNTER — Other Ambulatory Visit: Payer: Self-pay | Admitting: Family Medicine

## 2016-01-17 DIAGNOSIS — F5104 Psychophysiologic insomnia: Secondary | ICD-10-CM

## 2016-01-17 MED ORDER — ESZOPICLONE 2 MG PO TABS: 2.0000 mg | ORAL_TABLET | Freq: Every day | ORAL | Status: DC

## 2016-01-17 NOTE — Telephone Encounter (Signed)
Prescription is ready for patient pick up and she has been notified

## 2016-01-22 DIAGNOSIS — J301 Allergic rhinitis due to pollen: Secondary | ICD-10-CM | POA: Diagnosis not present

## 2016-01-26 ENCOUNTER — Telehealth: Payer: Self-pay

## 2016-01-26 DIAGNOSIS — F329 Major depressive disorder, single episode, unspecified: Secondary | ICD-10-CM

## 2016-01-26 DIAGNOSIS — F32A Depression, unspecified: Secondary | ICD-10-CM

## 2016-01-26 MED ORDER — PAROXETINE HCL 20 MG PO TABS: 20.0000 mg | ORAL_TABLET | Freq: Every day | ORAL | Status: DC

## 2016-01-26 NOTE — Telephone Encounter (Signed)
Paroxetine 20 mg tab has been refilled and sent to [facility]

## 2016-01-29 DIAGNOSIS — J301 Allergic rhinitis due to pollen: Secondary | ICD-10-CM | POA: Diagnosis not present

## 2016-01-30 DIAGNOSIS — H1045 Other chronic allergic conjunctivitis: Secondary | ICD-10-CM | POA: Diagnosis not present

## 2016-01-30 DIAGNOSIS — J301 Allergic rhinitis due to pollen: Secondary | ICD-10-CM | POA: Diagnosis not present

## 2016-02-08 DIAGNOSIS — J301 Allergic rhinitis due to pollen: Secondary | ICD-10-CM | POA: Diagnosis not present

## 2016-02-12 DIAGNOSIS — J301 Allergic rhinitis due to pollen: Secondary | ICD-10-CM | POA: Diagnosis not present

## 2016-02-15 ENCOUNTER — Ambulatory Visit (INDEPENDENT_AMBULATORY_CARE_PROVIDER_SITE_OTHER): Payer: PPO | Admitting: Family Medicine

## 2016-02-15 ENCOUNTER — Encounter: Payer: Self-pay | Admitting: Family Medicine

## 2016-02-15 VITALS — BP 122/74 | HR 77 | Temp 98.6°F | Wt 229.4 lb

## 2016-02-15 DIAGNOSIS — F5104 Psychophysiologic insomnia: Secondary | ICD-10-CM

## 2016-02-15 DIAGNOSIS — G4730 Sleep apnea, unspecified: Secondary | ICD-10-CM

## 2016-02-15 DIAGNOSIS — S46001A Unspecified injury of muscle(s) and tendon(s) of the rotator cuff of right shoulder, initial encounter: Secondary | ICD-10-CM | POA: Diagnosis not present

## 2016-02-15 DIAGNOSIS — M25561 Pain in right knee: Secondary | ICD-10-CM | POA: Insufficient documentation

## 2016-02-15 DIAGNOSIS — G4700 Insomnia, unspecified: Secondary | ICD-10-CM | POA: Diagnosis not present

## 2016-02-15 MED ORDER — ESZOPICLONE 2 MG PO TABS: 2.0000 mg | ORAL_TABLET | Freq: Every day | ORAL | Status: DC

## 2016-02-15 NOTE — Assessment & Plan Note (Signed)
Well-controlled with an Epworth sleepiness scale score of 4. She'll continue her CPAP. We'll continue to monitor.

## 2016-02-15 NOTE — Patient Instructions (Signed)
Nice to see you. I have refilled her Lunesta. We will refer you to orthopedic surgery for your right knee and rotator cuff. Please continue to use your CPAP machine nightly. If you develop worsening pain or any new or change in symptoms please seek medical attention.

## 2016-02-15 NOTE — Assessment & Plan Note (Signed)
Suspect right knee pain is related to degenerative meniscal issue. Relatively benign exam with only joint line tenderness. Discussed referral back to orthopedics and she asked for referral to a different orthopedist. [facility]. Patient will continue to monitor. Given return precautions.

## 2016-02-15 NOTE — Assessment & Plan Note (Signed)
Chronic issue. Advised on sleep hygiene. We will refill Lunesta. Discussed risks of this medication in somebody her age.

## 2016-02-15 NOTE — Progress Notes (Signed)
Pre visit review using our clinic review tool, if applicable. No additional management support is needed unless otherwise documented below in the visit note. 

## 2016-02-15 NOTE — Progress Notes (Signed)
Patient ID: Crystal LollDiane Haas, female   DOB: 05/05/1946, 70 y.o.   MRN: [id number]  Crystal AlarEric Kairon Shock, MD Phone: [phone]  Crystal LollDiane Haas is a 70 y.o. female who presents today for follow-up.  Knee pain: Patient notes since her last visit her right knee has given out on her 4 times. She has fallen to her knees on those occasions. Notes no head injury or loss of consciousness. She notes her right knee pops constantly. She has a history of torn meniscus in this knee. Takes Tylenol PM at night. No longer taking tramadol.  Insomnia: Has had this for years. Notes issues with sleeping if she doesn't take her Lunesta. Even with taking this she takes about 2 hours to fall asleep. Reads for 15-20 minutes prior to bed. Watches TV up to that time. 2 cups of coffee in the morning and minimal alcohol intake.  Also reports she has a her right rotator cuff. Had an injection in that at some point in the past with no help from this. She is followed by orthopedics for this.  Obstructive sleep apnea: Wears her CPAP every night. She does not wake well rested in the morning. She does not fall sleep very easily. Last time her CPAP machine was evaluated was 5 years ago. She has epworth sleepiness scale score of 4.  PMH: Former smoker.   ROS see history of present illness  Objective  Physical Exam Filed Vitals:   02/15/16 1325  BP: 122/74  Pulse: 77  Temp: 98.6 F (37 C)    BP Readings from Last 3 Encounters:  02/15/16 122/74  12/28/15 126/84  12/06/15 124/72   Wt Readings from Last 3 Encounters:  02/15/16 229 lb 6.4 oz (104.055 kg)  12/28/15 234 lb 3.2 oz (106.232 kg)  12/06/15 229 lb 2 oz (103.93 kg)    Physical Exam  Constitutional: She is well-developed, well-nourished, and in no distress.  HENT:  Head: Normocephalic and atraumatic.  Cardiovascular: Normal rate, regular rhythm and normal heart sounds.   Pulmonary/Chest: Effort normal and breath sounds normal.  Musculoskeletal:  Right knee with  mild joint line tenderness, no swelling, no effusion, no warmth, no ligamentous laxity, negative McMurray's, left knee with no joint line tenderness, swelling, effusion, warmth, or ligament laxity, negative McMurray's Right shoulder with discomfort on internal and external rotation, positive empty can test, left shoulder with no discomfort on range of motion, negative empty can test  Neurological: She is alert.  Skin: Skin is warm and dry. She is not diaphoretic.     Assessment/Plan: Please see individual problem list.  Chronic insomnia Chronic issue. Advised on sleep hygiene. We will refill Lunesta. Discussed risks of this medication in somebody her age.  Sleep apnea Well-controlled with an Epworth sleepiness scale score of 4. She'll continue her CPAP. We'll continue to monitor.  Right knee pain Suspect right knee pain is related to degenerative meniscal issue. Relatively benign exam with only joint line tenderness. Discussed referral back to orthopedics and she asked for referral to a different orthopedist. Placed referral to [facility]. Patient will continue to monitor. Given return precautions.  Injury of right rotator cuff Patient has been followed by orthopedics for right rotator cuff injury and dysfunction. States she has a tear of the rotator cuff. Discussed further follow-up with her orthopedist and she opted for referral to a new orthopedist. Referral was placed.    Orders Placed This Encounter  Procedures  . Ambulatory referral to Orthopedic Surgery    Referral Priority:  Routine    Referral Type:  Surgical    Referral Reason:  Specialty Services Required    Requested Specialty:  Orthopedic Surgery    Number of Visits Requested:  1    Meds ordered this encounter  Medications  . eszopiclone (LUNESTA) 2 MG TABS tablet    Sig: Take 1 tablet (2 mg total) by mouth at bedtime. Take immediately before bedtime    Dispense:  30 tablet    Refill:  0     Crystal Alar, MD [facility]

## 2016-02-15 NOTE — Assessment & Plan Note (Signed)
Patient has been followed by orthopedics for right rotator cuff injury and dysfunction. States she has a tear of the rotator cuff. Discussed further follow-up with her orthopedist and she opted for referral to a new orthopedist. Referral was placed.

## 2016-02-19 DIAGNOSIS — J301 Allergic rhinitis due to pollen: Secondary | ICD-10-CM | POA: Diagnosis not present

## 2016-02-23 ENCOUNTER — Telehealth: Payer: Self-pay | Admitting: Family Medicine

## 2016-02-23 MED ORDER — ACETAMINOPHEN-CODEINE #3 300-30 MG PO TABS: 1.0000 | ORAL_TABLET | Freq: Four times a day (QID) | ORAL | Status: DC | PRN

## 2016-02-23 NOTE — Telephone Encounter (Signed)
Called advise patient of tramadol interaction with Paxil. She is interested in alternative medications for her pain. NSAIDs are not a good option given that she is on Eliquis. Discussed Tylenol with codeine. This will be printed out for the patient and left to the front pickup.

## 2016-02-23 NOTE — Telephone Encounter (Signed)
Pt called wanting to know if she can get a refill on her Tramdoil. Pt states it was prescribed with no refills. Pharm[facility] is [id number] - [place] - [address] [place] [address] [place] [address]. Call pt @ [phone]. Thank you!

## 2016-02-23 NOTE — Telephone Encounter (Signed)
Patient needs for knee pain. [facility] [place]

## 2016-02-23 NOTE — Telephone Encounter (Signed)
Patient requesting refill of Tramadol. I do not see where we have prescribed this medication. Patient was last seen 02/15/2016. Please advise.

## 2016-02-23 NOTE — Telephone Encounter (Signed)
Patient reported that she had not been taking this medication at our last visit. Please determine if she has been taking this or not and if she is not find out if she needs it for her right ankle. Thanks.

## 2016-02-26 DIAGNOSIS — J301 Allergic rhinitis due to pollen: Secondary | ICD-10-CM | POA: Diagnosis not present

## 2016-02-26 NOTE — Telephone Encounter (Signed)
Placed RX up front for patient to pick up

## 2016-03-04 DIAGNOSIS — J301 Allergic rhinitis due to pollen: Secondary | ICD-10-CM | POA: Diagnosis not present

## 2016-03-07 ENCOUNTER — Other Ambulatory Visit: Payer: Self-pay

## 2016-03-07 DIAGNOSIS — F5104 Psychophysiologic insomnia: Secondary | ICD-10-CM

## 2016-03-07 MED ORDER — ESZOPICLONE 2 MG PO TABS: 2.0000 mg | ORAL_TABLET | Freq: Every day | ORAL | Status: DC

## 2016-03-08 ENCOUNTER — Telehealth: Payer: Self-pay

## 2016-03-08 NOTE — Telephone Encounter (Signed)
[facility] patient [facility]. No answer. Faxed to last pharmacy " [facility]".

## 2016-03-11 DIAGNOSIS — J301 Allergic rhinitis due to pollen: Secondary | ICD-10-CM | POA: Diagnosis not present

## 2016-03-12 ENCOUNTER — Encounter: Payer: Self-pay | Admitting: Cardiovascular Disease

## 2016-03-12 ENCOUNTER — Ambulatory Visit (INDEPENDENT_AMBULATORY_CARE_PROVIDER_SITE_OTHER): Payer: PPO | Admitting: Cardiovascular Disease

## 2016-03-12 VITALS — BP 126/90 | HR 78 | Ht 65.0 in | Wt 228.4 lb

## 2016-03-12 DIAGNOSIS — I482 Chronic atrial fibrillation, unspecified: Secondary | ICD-10-CM

## 2016-03-12 NOTE — Progress Notes (Signed)
Cardiology Office Note   Date:  03/12/2016   ID:  Crystal LollDiane Groll, DOB 10/14/1946, MRN [id number]  PCP:  Marikay AlarEric Sonnenberg, MD  Cardiologist:   Lorine BearsMuhammad Arida, MD   Chief Complaint  Patient presents with  . Follow-up    NO CP, SOB OR SWELLING. PATIENT COMPLAINS OF RIGHT KNEE PAIN. NO OTHER  COMPLAINTS.      History of Present Illness: Crystal Haas is a 70 y.o. female who presents for a follow-up visit regarding asymptomatic chronic atrial fibrillation. She has no previous history of hypertension, diabetes, stroke or heart failure.  Echocardiogram in July, 2016 showed normal LV systolic function with mildly dilated right and left atrium.  She is tolerating anticoagulation with Eliquis.   She has been doing well and denies any chest pain, shortness of breath or palpitations. She fell a few months ago and injured her right knee. She is scheduled to see Dr. Ernest PineHooten in the near future.   Past Medical History  Diagnosis Date  . Heart murmur     mild - followed by PCP  . GERD (gastroesophageal reflux disease)   . Anemia     distant past  . Depression   . Arthritis     "everywhere" - big toes worst  . Knee pain   . Sleep apnea     uses CPAP SS - [facility] 2013  . Motion sickness     back seat of car  . New onset a-fib [facility](HCC)     Past Surgical History  Procedure Laterality Date  . Retinal detachment surgery    . Tubal ligation    . Cataract extraction w/phaco Left 05/03/2015    Procedure: CATARACT EXTRACTION PHACO AND INTRAOCULAR LENS PLACEMENT (IOC);  Surgeon: Lockie Molahadwick Brasington, MD;  [facility];  Service: Ophthalmology;  Laterality: Left;  CPAP     Current Outpatient Prescriptions  Medication Sig Dispense Refill  . acetaminophen-codeine (TYLENOL #3) 300-30 MG tablet Take 1 tablet by mouth every 6 (six) hours as needed for moderate pain. 30 tablet 0  . apixaban (ELIQUIS) 5 MG TABS tablet Take 1 tablet (5 mg total) by mouth 2 (two) times daily. 60 tablet 9  .  cholecalciferol (VITAMIN D) 1000 UNITS tablet Take 2,000 Units by mouth daily.    . diphenhydramine-acetaminophen (TYLENOL PM) 25-500 MG TABS Take 1 tablet by mouth at bedtime as needed (AS NEEDED FOR SLEEP).     Marland Kitchen. EPIPEN 2-PAK 0.3 MG/0.3ML SOAJ injection Inject as directed as directed. AS NEEDED FOR ANAPHYLAXIS    . eszopiclone (LUNESTA) 2 MG TABS tablet Take 1 tablet (2 mg total) by mouth at bedtime. Take immediately before bedtime 30 tablet 0  . fexofenadine (ALLEGRA) 180 MG tablet Take 180 mg by mouth daily. PM    . fluticasone (FLONASE) 50 MCG/ACT nasal spray Place 1 spray into both nostrils daily.    Marland Kitchen. glucosamine-chondroitin 500-400 MG tablet Take 1 tablet by mouth daily. PM    . mometasone (NASONEX) 50 MCG/ACT nasal spray Place 2 sprays into the nose daily.    . Multiple Vitamins-Minerals (CENTRUM PO) Take by mouth. AM    . PARoxetine (PAXIL) 20 MG tablet Take 1 tablet (20 mg total) by mouth daily. 90 tablet 0  . VIGAMOX 0.5 % ophthalmic solution Place 1 drop into both eyes 3 (three) times daily.   0   No current facility-administered medications for this visit.    Allergies:   Apple; Daucus carota; Ivp dye; Other; Strawberry (diagnostic); Strawberry  extract; Iodine; Latex; and Tape    Social History:  The patient  reports that she quit smoking about 46 years ago. She does not have any smokeless tobacco history on file. She reports that she drinks about 4.2 oz of alcohol per week. She reports that she does not use illicit drugs.      ROS:  Please see the history of present illness.   Otherwise, review of systems are positive for none.   All other systems are reviewed and negative.    PHYSICAL EXAM: VS:  BP 126/90 mmHg  Pulse 78  Ht  (1.651 m)  Wt 228 lb 6.4 oz (103.602 kg)  BMI 38.01 kg/m2 , BMI Body mass index is 38.01 kg/(m^2). GEN: Well nourished, well developed, in no acute distress HEENT: normal Neck: no JVD, carotid bruits, or masses Cardiac: Irregularly  irregular; no murmurs, rubs, or gallops,no edema  Respiratory:  clear to auscultation bilaterally, normal work of breathing GI: soft, nontender, nondistended, + BS MS: no deformity or atrophy Skin: warm and dry, no rash Neuro:  Strength and sensation are intact Psych: euthymic mood, full affect   EKG:  EKG is not ordered today.    Recent Labs: 05/03/2015: Hemoglobin 13.2 09/04/2015: ALT 17; BUN 15; Creatinine, Ser 1.02*; Platelets 227; Potassium 5.9*; Sodium 146*; TSH 3.370    Lipid Panel    Component Value Date/Time   CHOL 186 09/04/2015 1051   TRIG 103 09/04/2015 1051   HDL 66 09/04/2015 1051   CHOLHDL 2.8 09/04/2015 1051   LDLCALC 99 09/04/2015 1051      Wt Readings from Last 3 Encounters:  03/12/16 228 lb 6.4 oz (103.602 kg)  02/15/16 229 lb 6.4 oz (104.055 kg)  12/28/15 234 lb 3.2 oz (106.232 kg)        ASSESSMENT AND PLAN:   Chronic atrial fibrillation: Ventricular rate is controlled without any medication. She is tolerating anticoagulation with no side effects.  Follow up in 1 year.    Disposition:   FU with me in 1 year  Signed,  Lorine Bears, MD  03/12/2016 4:26 PM     [facility]

## 2016-03-12 NOTE — Patient Instructions (Signed)
Medication Instructions: Continue same medications.   Labwork: None.   Procedures/Testing: None.   Follow-Up: 1 year with Dr. Bay Jarquin.   Any Additional Special Instructions Will Be Listed Below (If Applicable).     If you need a refill on your cardiac medications before your next appointment, please call your pharmacy.   

## 2016-03-13 ENCOUNTER — Other Ambulatory Visit: Payer: Self-pay | Admitting: Family Medicine

## 2016-03-13 DIAGNOSIS — F5104 Psychophysiologic insomnia: Secondary | ICD-10-CM

## 2016-03-14 MED ORDER — ACETAMINOPHEN-CODEINE #3 300-30 MG PO TABS: 1.0000 | ORAL_TABLET | Freq: Four times a day (QID) | ORAL | Status: DC | PRN

## 2016-03-14 MED ORDER — ESZOPICLONE 2 MG PO TABS: 2.0000 mg | ORAL_TABLET | Freq: Every day | ORAL | Status: DC

## 2016-03-14 NOTE — Addendum Note (Signed)
Addended by: Birdie SonsSONNENBERG, Taneal Sonntag G on: 03/14/2016 02:02 PM   Modules accepted: Orders

## 2016-03-19 DIAGNOSIS — H40003 Preglaucoma, unspecified, bilateral: Secondary | ICD-10-CM | POA: Diagnosis not present

## 2016-03-19 DIAGNOSIS — H43821 Vitreomacular adhesion, right eye: Secondary | ICD-10-CM | POA: Diagnosis not present

## 2016-03-25 DIAGNOSIS — J301 Allergic rhinitis due to pollen: Secondary | ICD-10-CM | POA: Diagnosis not present

## 2016-03-26 ENCOUNTER — Encounter: Payer: Self-pay | Admitting: Family Medicine

## 2016-04-01 ENCOUNTER — Telehealth: Payer: Self-pay | Admitting: Surgical

## 2016-04-01 DIAGNOSIS — G4733 Obstructive sleep apnea (adult) (pediatric): Secondary | ICD-10-CM

## 2016-04-01 NOTE — Telephone Encounter (Signed)
I respectfully request that an order be faxed to [facility] [phone])  for c-pap supplies and masks.  [employer] filed a Administrator, sportsgrievance against my prior supplier and I need to get supplies ordered.  This was sent through My Chart

## 2016-04-02 DIAGNOSIS — J301 Allergic rhinitis due to pollen: Secondary | ICD-10-CM | POA: Diagnosis not present

## 2016-04-02 NOTE — Telephone Encounter (Signed)
DME order for CPAP supplies and mask placed. Please fax to [facility]. Thanks.

## 2016-04-03 NOTE — Telephone Encounter (Signed)
RX faxed

## 2016-04-05 DIAGNOSIS — M2391 Unspecified internal derangement of right knee: Secondary | ICD-10-CM | POA: Diagnosis not present

## 2016-04-05 DIAGNOSIS — G8929 Other chronic pain: Secondary | ICD-10-CM | POA: Diagnosis not present

## 2016-04-05 DIAGNOSIS — M25561 Pain in right knee: Secondary | ICD-10-CM | POA: Diagnosis not present

## 2016-04-15 DIAGNOSIS — J301 Allergic rhinitis due to pollen: Secondary | ICD-10-CM | POA: Diagnosis not present

## 2016-04-19 ENCOUNTER — Encounter
Admission: RE | Admit: 2016-04-19 | Discharge: 2016-04-19 | Disposition: A | Discharge status: Home / Self Care | Payer: PPO | Source: Ambulatory Visit | Attending: Orthopedic Surgery | Admitting: Orthopedic Surgery

## 2016-04-19 ENCOUNTER — Telehealth: Payer: Self-pay | Admitting: Cardiovascular Disease

## 2016-04-19 DIAGNOSIS — Z01812 Encounter for preprocedural laboratory examination: Secondary | ICD-10-CM | POA: Diagnosis not present

## 2016-04-19 HISTORY — DX: Anxiety disorder, unspecified: F41.9

## 2016-04-19 LAB — DIFFERENTIAL
Basophils Absolute: 0.1 10*3/uL (ref 0–0.1)
Basophils Relative: 1 %
EOS PCT: 4 %
Eosinophils Absolute: 0.3 10*3/uL (ref 0–0.7)
LYMPHS PCT: 21 %
Lymphs Abs: 1.5 10*3/uL (ref 1.0–3.6)
MONO ABS: 0.9 10*3/uL (ref 0.2–0.9)
MONOS PCT: 13 %
NEUTROS ABS: 4.4 10*3/uL (ref 1.4–6.5)
Neutrophils Relative %: 61 %

## 2016-04-19 LAB — CBC
HEMATOCRIT: 40.2 % (ref 35.0–47.0)
Hemoglobin: 13.5 g/dL (ref 12.0–16.0)
MCH: 30.8 pg (ref 26.0–34.0)
MCHC: 33.6 g/dL (ref 32.0–36.0)
MCV: 91.8 fL (ref 80.0–100.0)
PLATELETS: 200 10*3/uL (ref 150–440)
RBC: 4.38 MIL/uL (ref 3.80–5.20)
RDW: 13.6 % (ref 11.5–14.5)
WBC: 7.2 10*3/uL (ref 3.6–11.0)

## 2016-04-19 NOTE — Telephone Encounter (Signed)
Pt advised at PAT appt today to ask Dr. Kirke CorinArida about holding eliquis prior to 7/10 right knee arthoscopy. Pt takes eliquis 5mg  for asymptomatic chronic afib.  Forward to MD to review and advise.

## 2016-04-19 NOTE — Telephone Encounter (Signed)
Patient is having knee surgery on 04-29-16 . Patient needs to know when to stop taking Eliquis 5 mg po BID.  Please call to clarify.

## 2016-04-19 NOTE — Telephone Encounter (Signed)
Stop Eliquis 2 days before surgery. Resume once cleared by the surgeon.

## 2016-04-19 NOTE — Patient Instructions (Signed)
  Your procedure is scheduled on: April 29, 2016 (Monday) Report to Day Surgery. SECOND [facility]LOOR [facility] To find out your arrival time please call [phone] between 1PM - 3PM on April 26, 2016 (Friday).  Remember: Instructions that are not followed completely may result in serious medical risk, up to and including death, or upon the discretion of your surgeon and anesthesiologist your surgery may need to be rescheduled.    _x___ 1. Do not eat food or drink liquids after midnight. No gum chewing or hard candies.     _x___ 2. No Alcohol for 24 hours before or after surgery.   _x___ 3. Do Not Smoke For 24 Hours Prior to Your Surgery.   ____ 4. Bring all medications with you on the day of surgery if instructed.    _x___ 5. Notify your doctor if there is any change in your medical condition     (cold, fever, infections).       Do not wear jewelry, make-up, hairpins, clips or nail polish.  Do not wear lotions, powders, or perfumes. You may wear deodorant.  Do not shave 48 hours prior to surgery. Men may shave face and neck.  Do not bring valuables to the hospital.    [facility] is not responsible for any belongings or valuables.               Contacts, dentures or bridgework may not be worn into surgery.  Leave your suitcase in the car. After surgery it may be brought to your room.  For patients admitted to the hospital, discharge time is determined by your                treatment team.   Patients discharged the day of surgery will not be allowed to drive home.   Please read over the following fact sheets that you were given:   Surgical Site Infection Prevention   ____ Take these medicines the morning of surgery with A SIP OF WATER:    1.   2.   3.   4.  5.  6.  ____ Fleet Enema (as directed)   _x___ Use CHG Soap as directed  ____ Use inhalers on the day of surgery  ____ Stop metformin 2 days prior to surgery    ____ Take 1/2 of usual insulin dose the night before  surgery and none on the morning of surgery.   _x___ Stop Coumadin/Plavix/aspirin on (NO ASPIRIN) OR ASPIRIN PRODUCTS  (ASK DR. Kirke CorinARIDA ABOUT STOPPING ELIQUIS)  _x___ Stop Anti-inflammatories on (NO NSAIDS) Tylenol ok to take for pain if needed   _x___ Stop supplements until after surgery.  (STOP GLUCOSAMINE NOW)  _x___ Bring C-Pap to the hospital.

## 2016-04-22 NOTE — Telephone Encounter (Signed)
Left message on machine for patient to contact the office.   

## 2016-04-22 NOTE — Telephone Encounter (Signed)
Reviewed Dr. Jari SportsmanArida's recommendations regarding Eliquis with pt who verbalized understanding. Pt had no further questions.

## 2016-04-23 LAB — LATEX, IGE: Latex: <0.1 kU/L

## 2016-04-24 NOTE — Pre-Procedure Instructions (Signed)
Latex Rast results faxed to Dr. Ernest PineHooten office and left message with Elizabeth PalauElaine Russell.

## 2016-04-25 DIAGNOSIS — G4733 Obstructive sleep apnea (adult) (pediatric): Secondary | ICD-10-CM | POA: Diagnosis not present

## 2016-04-29 ENCOUNTER — Encounter
Admission: RE | Disposition: A | Discharge status: Home / Self Care | Payer: Self-pay | Source: Ambulatory Visit | Attending: Orthopedic Surgery

## 2016-04-29 ENCOUNTER — Ambulatory Visit: Payer: PPO | Admitting: Anesthesiology

## 2016-04-29 ENCOUNTER — Ambulatory Visit
Admission: RE | Admit: 2016-04-29 | Discharge: 2016-04-29 | Disposition: A | Discharge status: Home / Self Care | Payer: PPO | Source: Ambulatory Visit | Attending: Orthopedic Surgery | Admitting: Orthopedic Surgery

## 2016-04-29 ENCOUNTER — Encounter: Payer: Self-pay | Admitting: Orthopedic Surgery

## 2016-04-29 DIAGNOSIS — Z79899 Other long term (current) drug therapy: Secondary | ICD-10-CM | POA: Diagnosis not present

## 2016-04-29 DIAGNOSIS — Z9889 Other specified postprocedural states: Secondary | ICD-10-CM | POA: Insufficient documentation

## 2016-04-29 DIAGNOSIS — I499 Cardiac arrhythmia, unspecified: Secondary | ICD-10-CM | POA: Diagnosis not present

## 2016-04-29 DIAGNOSIS — G8929 Other chronic pain: Secondary | ICD-10-CM | POA: Insufficient documentation

## 2016-04-29 DIAGNOSIS — F419 Anxiety disorder, unspecified: Secondary | ICD-10-CM | POA: Insufficient documentation

## 2016-04-29 DIAGNOSIS — Z83511 Family history of glaucoma: Secondary | ICD-10-CM | POA: Insufficient documentation

## 2016-04-29 DIAGNOSIS — I10 Essential (primary) hypertension: Secondary | ICD-10-CM | POA: Insufficient documentation

## 2016-04-29 DIAGNOSIS — Z833 Family history of diabetes mellitus: Secondary | ICD-10-CM | POA: Diagnosis not present

## 2016-04-29 DIAGNOSIS — Z91018 Allergy to other foods: Secondary | ICD-10-CM | POA: Diagnosis not present

## 2016-04-29 DIAGNOSIS — Z6839 Body mass index (BMI) 39.0-39.9, adult: Secondary | ICD-10-CM | POA: Insufficient documentation

## 2016-04-29 DIAGNOSIS — Z91040 Latex allergy status: Secondary | ICD-10-CM | POA: Diagnosis not present

## 2016-04-29 DIAGNOSIS — Z7951 Long term (current) use of inhaled steroids: Secondary | ICD-10-CM | POA: Diagnosis not present

## 2016-04-29 DIAGNOSIS — M94261 Chondromalacia, right knee: Secondary | ICD-10-CM | POA: Insufficient documentation

## 2016-04-29 DIAGNOSIS — Z9851 Tubal ligation status: Secondary | ICD-10-CM | POA: Diagnosis not present

## 2016-04-29 DIAGNOSIS — Z87891 Personal history of nicotine dependence: Secondary | ICD-10-CM | POA: Insufficient documentation

## 2016-04-29 DIAGNOSIS — M23211 Derangement of anterior horn of medial meniscus due to old tear or injury, right knee: Secondary | ICD-10-CM | POA: Insufficient documentation

## 2016-04-29 DIAGNOSIS — Z7901 Long term (current) use of anticoagulants: Secondary | ICD-10-CM | POA: Insufficient documentation

## 2016-04-29 DIAGNOSIS — I4891 Unspecified atrial fibrillation: Secondary | ICD-10-CM | POA: Diagnosis not present

## 2016-04-29 DIAGNOSIS — M23241 Derangement of anterior horn of lateral meniscus due to old tear or injury, right knee: Secondary | ICD-10-CM | POA: Insufficient documentation

## 2016-04-29 DIAGNOSIS — S83281A Other tear of lateral meniscus, current injury, right knee, initial encounter: Secondary | ICD-10-CM | POA: Diagnosis not present

## 2016-04-29 DIAGNOSIS — M2391 Unspecified internal derangement of right knee: Secondary | ICD-10-CM | POA: Diagnosis not present

## 2016-04-29 DIAGNOSIS — Z8669 Personal history of other diseases of the nervous system and sense organs: Secondary | ICD-10-CM | POA: Diagnosis not present

## 2016-04-29 DIAGNOSIS — S83241A Other tear of medial meniscus, current injury, right knee, initial encounter: Secondary | ICD-10-CM | POA: Diagnosis not present

## 2016-04-29 DIAGNOSIS — Z91048 Other nonmedicinal substance allergy status: Secondary | ICD-10-CM | POA: Insufficient documentation

## 2016-04-29 DIAGNOSIS — G4730 Sleep apnea, unspecified: Secondary | ICD-10-CM | POA: Diagnosis not present

## 2016-04-29 HISTORY — PX: KNEE ARTHROSCOPY WITH LATERAL MENISECTOMY: SHX6193

## 2016-04-29 HISTORY — PX: CHONDROPLASTY: SHX5177

## 2016-04-29 HISTORY — PX: KNEE ARTHROSCOPY WITH MEDIAL MENISECTOMY: SHX5651

## 2016-04-29 SURGERY — ARTHROSCOPY, KNEE, WITH MEDIAL MENISCECTOMY
Anesthesia: General | Site: Knee | Laterality: Right

## 2016-04-29 MED ORDER — CHLORHEXIDINE GLUCONATE 4 % EX LIQD: 60.0000 mL | Freq: Once | CUTANEOUS | Status: DC

## 2016-04-29 MED ORDER — ONDANSETRON HCL 4 MG/2ML IJ SOLN: 4.0000 mg | Freq: Once | INTRAMUSCULAR | Status: DC | PRN

## 2016-04-29 MED ORDER — MORPHINE SULFATE (PF) 4 MG/ML IV SOLN
INTRAVENOUS | Status: AC
  Filled 2016-04-29: qty 1

## 2016-04-29 MED ORDER — FAMOTIDINE 20 MG PO TABS
20.0000 mg | ORAL_TABLET | Freq: Once | ORAL | Status: AC
  Administered 2016-04-29: 20 mg via ORAL

## 2016-04-29 MED ORDER — FENTANYL CITRATE (PF) 100 MCG/2ML IJ SOLN
INTRAMUSCULAR | Status: DC | PRN
  Administered 2016-04-29: 250 ug via INTRAVENOUS

## 2016-04-29 MED ORDER — ACETAMINOPHEN 10 MG/ML IV SOLN
INTRAVENOUS | Status: DC | PRN
  Administered 2016-04-29: 1000 mg via INTRAVENOUS

## 2016-04-29 MED ORDER — HYDROCODONE-ACETAMINOPHEN 5-325 MG PO TABS: 1.0000 | ORAL_TABLET | ORAL | Status: DC | PRN

## 2016-04-29 MED ORDER — LIDOCAINE HCL (CARDIAC) 20 MG/ML IV SOLN
INTRAVENOUS | Status: DC | PRN
  Administered 2016-04-29: 100 mg via INTRAVENOUS

## 2016-04-29 MED ORDER — ACETAMINOPHEN 10 MG/ML IV SOLN
INTRAVENOUS | Status: AC
  Filled 2016-04-29: qty 100

## 2016-04-29 MED ORDER — MORPHINE SULFATE (PF) 4 MG/ML IV SOLN
INTRAVENOUS | Status: DC | PRN
  Administered 2016-04-29: 4 mg

## 2016-04-29 MED ORDER — BUPIVACAINE-EPINEPHRINE 0.25% -1:200000 IJ SOLN
INTRAMUSCULAR | Status: DC | PRN
  Administered 2016-04-29: 5 mL
  Administered 2016-04-29: 25 mL

## 2016-04-29 MED ORDER — SUGAMMADEX SODIUM 200 MG/2ML IV SOLN
INTRAVENOUS | Status: DC | PRN
  Administered 2016-04-29: 100 mg via INTRAVENOUS

## 2016-04-29 MED ORDER — FAMOTIDINE 20 MG PO TABS
ORAL_TABLET | ORAL | Status: AC
  Administered 2016-04-29: 20 mg via ORAL
  Filled 2016-04-29: qty 1

## 2016-04-29 MED ORDER — LACTATED RINGERS IV SOLN
INTRAVENOUS | Status: DC
Start: 2016-04-29 — End: 2016-04-29
  Administered 2016-04-29: 12:00:00 via INTRAVENOUS

## 2016-04-29 MED ORDER — LABETALOL HCL 5 MG/ML IV SOLN
INTRAVENOUS | Status: DC | PRN
  Administered 2016-04-29: 5 mg via INTRAVENOUS

## 2016-04-29 MED ORDER — ONDANSETRON HCL 4 MG/2ML IJ SOLN
INTRAMUSCULAR | Status: DC | PRN
  Administered 2016-04-29: 4 mg via INTRAVENOUS

## 2016-04-29 MED ORDER — PROPOFOL 10 MG/ML IV BOLUS
INTRAVENOUS | Status: DC | PRN
  Administered 2016-04-29: 170 mg via INTRAVENOUS

## 2016-04-29 MED ORDER — FENTANYL CITRATE (PF) 100 MCG/2ML IJ SOLN: 25.0000 ug | INTRAMUSCULAR | Status: DC | PRN

## 2016-04-29 MED ORDER — ROCURONIUM BROMIDE 100 MG/10ML IV SOLN
INTRAVENOUS | Status: DC | PRN
  Administered 2016-04-29: 50 mg via INTRAVENOUS

## 2016-04-29 MED ORDER — BUPIVACAINE-EPINEPHRINE (PF) 0.25% -1:200000 IJ SOLN
INTRAMUSCULAR | Status: AC
  Filled 2016-04-29: qty 30

## 2016-04-29 SURGICAL SUPPLY — 23 items
BLADE SHAVER 4.5 DBL SERAT CV (CUTTER) ×4 IMPLANT
BNDG ESMARK 6X12 TAN STRL LF (GAUZE/BANDAGES/DRESSINGS) ×4 IMPLANT
CUFF TOURN 24 STER (MISCELLANEOUS) IMPLANT
CUFF TOURN 30 STER DUAL PORT (MISCELLANEOUS) ×4 IMPLANT
DRSG DERMACEA 8X12 NADH (GAUZE/BANDAGES/DRESSINGS) ×4 IMPLANT
DURAPREP 26ML APPLICATOR (WOUND CARE) ×8 IMPLANT
GAUZE SPONGE 4X4 12PLY STRL (GAUZE/BANDAGES/DRESSINGS) ×4 IMPLANT
GLOVE BIOGEL M STRL SZ7.5 (GLOVE) ×4 IMPLANT
GLOVE INDICATOR 8.0 STRL GRN (GLOVE) ×4 IMPLANT
GOWN STRL REUS W/ TWL LRG LVL3 (GOWN DISPOSABLE) ×6 IMPLANT
GOWN STRL REUS W/TWL LRG LVL3 (GOWN DISPOSABLE) ×2
IV LACTATED RINGER IRRG 3000ML (IV SOLUTION) ×6
IV LR IRRIG 3000ML ARTHROMATIC (IV SOLUTION) ×18 IMPLANT
KIT RM TURNOVER STRD PROC AR (KITS) ×4 IMPLANT
MANIFOLD NEPTUNE II (INSTRUMENTS) ×4 IMPLANT
PACK ARTHROSCOPY KNEE (MISCELLANEOUS) ×4 IMPLANT
SET TUBE SUCT SHAVER OUTFL 24K (TUBING) ×4 IMPLANT
SET TUBE TIP INTRA-ARTICULAR (MISCELLANEOUS) ×4 IMPLANT
SUT ETHILON 3-0 FS-10 30 BLK (SUTURE) ×4
SUTURE EHLN 3-0 FS-10 30 BLK (SUTURE) ×3 IMPLANT
TUBING ARTHRO INFLOW-ONLY STRL (TUBING) ×4 IMPLANT
WAND HAND CNTRL MULTIVAC 50 (MISCELLANEOUS) ×4 IMPLANT
WRAP KNEE W/COLD PACKS 25.5X14 (SOFTGOODS) ×4 IMPLANT

## 2016-04-29 NOTE — Anesthesia Preprocedure Evaluation (Signed)
Anesthesia Evaluation  Patient identified by MRN, date of birth, ID band Patient awake    Reviewed: Allergy & Precautions, NPO status , Patient's Chart, lab work & pertinent test results  Airway Mallampati: III       Dental  (+) Teeth Intact   Pulmonary sleep apnea , former smoker,    breath sounds clear to auscultation       Cardiovascular + dysrhythmias Atrial Fibrillation  Rhythm:Regular Rate:Normal     Neuro/Psych Anxiety Depression    GI/Hepatic Neg liver ROS, GERD  Medicated,  Endo/Other  Morbid obesity  Renal/GU negative Renal ROS     Musculoskeletal   Abdominal (+) + obese,   Peds  Hematology  (+) anemia ,   Anesthesia Other Findings   Reproductive/Obstetrics                             Anesthesia Physical Anesthesia Plan  ASA: III  Anesthesia Plan: General   Post-op Pain Management:    Induction: Intravenous  Airway Management Planned: LMA  Additional Equipment:   Intra-op Plan:   Post-operative Plan: Extubation in OR  Informed Consent: I have reviewed the patients History and Physical, chart, labs and discussed the procedure including the risks, benefits and alternatives for the proposed anesthesia with the patient or authorized representative who has indicated his/her understanding and acceptance.     Plan Discussed with: CRNA  Anesthesia Plan Comments:         Anesthesia Quick Evaluation

## 2016-04-29 NOTE — Anesthesia Procedure Notes (Signed)
Procedure Name: Intubation Date/Time: 04/29/2016 1:14 PM Performed by: Shirlee LimerickMARION, Crystal Neuzil Pre-anesthesia Checklist: Patient identified, Emergency Drugs available, Suction available and Patient being monitored Patient Re-evaluated:Patient Re-evaluated prior to inductionOxygen Delivery Method: Circle system utilized Preoxygenation: Pre-oxygenation with 100% oxygen Intubation Type: IV induction Laryngoscope Size: Mac and 3 Grade View: Grade I Tube size: 7.0 mm Number of attempts: 1 Placement Confirmation: ETT inserted through vocal cords under direct vision,  positive ETCO2 and breath sounds checked- equal and bilateral Secured at: 21 cm Tube secured with: Tape Dental Injury: Teeth and Oropharynx as per pre-operative assessment

## 2016-04-29 NOTE — Op Note (Signed)
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   ASSISTANT: none  ANESTHESIA: general  ESTIMATED BLOOD LOSS: Minimal  FLUIDS REPLACED: 400 mL of crystalloid  TOURNIQUET TIME: Not used   DRAINS: none  IMPLANTS UTILIZED: None  INDICATIONS FOR SURGERY: Crystal Haas is a 70 y.o. year old female who has been seen for complaints of right knee pain. Findings were consistent with meniscal pathology. After discussion of the risks and benefits of surgical intervention, the patient expressed understanding of the risks benefits and agree with plans for right knee arthroscopy.   PROCEDURE IN DETAIL: The patient was brought into the operating room and, after adequate general anesthesia was achieved, a tourniquet was applied to the right thigh and the leg was placed in the leg holder. All bony prominences were well padded. The patient's right knee was cleaned and prepped with alcohol and Duraprep and draped in the usual sterile fashion. A "timeout" was performed as per usual protocol. The anticipated portal sites were injected with 0.25% Marcaine with epinephrine. An anterolateral incision was made and a cannula was inserted. A small effusion was evacuated and the knee was distended with fluid using the pump. The scope was advanced down the medial gutter into the medial compartment. Under visualization with the scope, an anteromedial portal was created and a hooked probe was inserted. The medial meniscus was  visualized and probed. There was a radial tear of the anterior horn of the medial meniscus. The tear was debrided and contoured using the ArthroCare wand. The remaining rim meniscus was visualized and probed and felt to be stable. The articular cartilage was visualized. There was an area of grade 3 chondromalacia involving the medial femoral condyle. The area was debrided and contoured using the 50 ArthroCare wand.  The scope was then advanced into the intercondylar notch. The anterior cruciate ligament was visualized and probed and felt to be intact. The scope was removed from the lateral portal and reinserted via the anteromedial portal to better visualize the lateral compartment. The lateral meniscus was visualized and probed. There was a complex tear involving the anterior horn of the lateral meniscus extending to the posterolateral corner. The tear was debrided using meniscal punches and a 4.5 mm incisor shaver. Contouring and hemostasis was achieved using the ArthroCare wand. The posterior aspect was probed and felt to be stable. The articular cartilage of the lateral compartment was visualized. There was a localized area of grade 3 chondromalacia involving the lateral femoral condyle. The area was debrided and contoured using the 50 ArthroCare wand. Finally, the scope was advanced so as to visualize the patellofemoral articulation. Good patellar tracking was appreciated. The articular surface was in good condition.  The knee was irrigated with copius amounts of fluid and suctioned dry. The anterolateral portal was re-approximated with #3-0 nylon. A combination of 0.25% Marcaine with epinephrine and 4 mg of Morphine were injected via the scope. The scope was removed and the anteromedial portal was re-approximated with #3-0 nylon. A sterile dressing was  applied followed by application of an ice wrap.  The patient tolerated the procedure well and was transported to the PACU in stable condition.  Connie Hilgert  P. Angie FavaHooten, Jr., M.D.

## 2016-04-29 NOTE — Transfer of Care (Signed)
Immediate Anesthesia Transfer of Care Note  Patient: Crystal LollDiane Haas  Procedure(s) Performed: Procedure(s): KNEE ARTHROSCOPY WITH MEDIAL MENISECTOMY KNEE ARTHROSCOPY WITH LATERAL MENISECTOMY CHONDROPLASTY (Right)  Patient Location: PACU  Anesthesia Type:General  Level of Consciousness: awake  Airway & Oxygen Therapy: Patient connected to nasal cannula oxygen  Post-op Assessment: Report given to RN  Post vital signs: stable  Last Vitals:  Filed Vitals:   04/29/16 1200 04/29/16 1446  BP: 146/78 153/85  Pulse: 75 80  Temp: 36.9 C 36.6 C  Resp: 16 13    Last Pain: There were no vitals filed for this visit.       Complications: No apparent anesthesia complications

## 2016-04-29 NOTE — Discharge Instructions (Signed)
°  Instructions after Knee Arthroscopy    James P. Angie FavaHooten, Jr., M.D.     Dept. of Orthopaedics & Sports Medicine  [facility]  [address]  [place], [place]  [address]   Phone: [phone]   Fax: [phone]   DIET:  Drink plenty of non-alcoholic fluids & begin a light diet.  Resume your normal diet the day after surgery.  ACTIVITY:   You may use crutches or a walker with weight-bearing as tolerated, unless instructed otherwise.  You may wean yourself off of the walker or crutches as tolerated.   Begin doing gentle exercises. Exercising will reduce the pain and swelling, increase motion, and prevent muscle weakness.    Avoid strenuous activities or athletics for a minimum of 4-6 weeks after arthroscopic surgery.  Do not drive or operate any equipment until instructed.  WOUND CARE:   Place one to two pillows under the knee the first day or two when sitting or lying.   Continue to use the ice packs periodically to reduce pain and swelling.  The small incisions in your knee are closed with nylon stitches. The stitches will be removed in the office.  The bulky dressing may be removed on the second day after surgery. DO NOT TOUCH THE STITCHES. Put a Band-Aid over each stitch. Do NOT use any ointments or creams on the incisions.   You may bathe or shower after the stitches are removed at the first office visit following surgery.  MEDICATIONS:  You may resume your regular medications.  Please take the pain medication as prescribed.  Do not take pain medication on an empty stomach.  Do not drive or drink alcoholic beverages when taking pain medications.  CALL THE OFFICE FOR:  Temperature above 101 degrees  Excessive bleeding or drainage on the dressing.  Excessive swelling, coldness, or paleness of the toes.  Persistent nausea and vomiting.  FOLLOW-UP:   You should have an appointment to return to the office in 7-10 days after surgery.    AMBULATORY  SURGERY  DISCHARGE INSTRUCTIONS   1) The drugs that you were given will stay in your system until tomorrow so for the next 24 hours you should not:  A) Drive an automobile B) Make any legal decisions C) Drink any alcoholic beverage   2) You may resume regular meals tomorrow.  Today it is better to start with liquids and gradually work up to solid foods.  You may eat anything you prefer, but it is better to start with liquids, then soup and crackers, and gradually work up to solid foods.   3) Please notify your doctor immediately if you have any unusual bleeding, trouble breathing, redness and pain at the surgery site, drainage, fever, or pain not relieved by medication.    4) Additional Instructions:  Start Eliquis tomorrow 04/30/16       Please contact your physician with any problems or Same Day Surgery at [phone], Monday through Friday 6 am to 4 pm, or [facility] at [facility] Main number at [phone].

## 2016-04-29 NOTE — H&P (Signed)
The patient has been re-examined, and the chart reviewed, and there have been no interval changes to the documented history and physical.    The risks, benefits, and alternatives have been discussed at length. The patient expressed understanding of the risks benefits and agreed with plans for surgical intervention.  Jolette Lana P. Katria Botts, Jr. M.D.    

## 2016-04-29 NOTE — Brief Op Note (Signed)
04/29/2016  2:44 PM  PATIENT:  Crystal Haas  70 y.o. female  PRE-OPERATIVE DIAGNOSIS:  CHRONIC PAIN RIGHT KNEE,INTERNAL DERANGEMENT  POST-OPERATIVE DIAGNOSIS:  tear anterior horn medial and lateral meniscus, grade III medial and lateral femoral condyle  PROCEDURE:  Procedure(s): KNEE ARTHROSCOPY WITH MEDIAL MENISECTOMY KNEE ARTHROSCOPY WITH LATERAL MENISECTOMY CHONDROPLASTY (Right)  SURGEON:  Surgeon(s) and Role:    * Donato HeinzJames P Hooten, MD - Primary  PHYSICIAN ASSISTANT:   ASSISTANTS: none   ANESTHESIA:   general  EBL:  Total I/O In: -  Out: 5 [Blood:5]  BLOOD ADMINISTERED:none  DRAINS: none   LOCAL MEDICATIONS USED:  MARCAINE     SPECIMEN:  No Specimen  DISPOSITION OF SPECIMEN:  N/A  COUNTS:  YES  TOURNIQUET:  Not used  DICTATION: .Dragon Dictation  PLAN OF CARE: Discharge to home after PACU  PATIENT DISPOSITION:  PACU - hemodynamically stable.   Delay start of Pharmacological VTE agent (>24hrs) due to surgical blood loss or risk of bleeding: not applicable

## 2016-04-30 ENCOUNTER — Encounter: Payer: Self-pay | Admitting: Orthopedic Surgery

## 2016-04-30 NOTE — Anesthesia Postprocedure Evaluation (Signed)
Anesthesia Post Note  Patient: Crystal Haas  Procedure(s) Performed: Procedure(s) (LRB): KNEE ARTHROSCOPY WITH MEDIAL MENISECTOMY KNEE ARTHROSCOPY WITH LATERAL MENISECTOMY CHONDROPLASTY (Right)  Patient location during evaluation: PACU Anesthesia Type: General Level of consciousness: awake Pain management: pain level controlled Vital Signs Assessment: post-procedure vital signs reviewed and stable Respiratory status: spontaneous breathing Cardiovascular status: blood pressure returned to baseline Anesthetic complications: no    Last Vitals:  Filed Vitals:   04/29/16 1543 04/29/16 1637  BP: 156/68 151/55  Pulse: 94 76  Temp: 36.7 C 36.6 C  Resp: 100 14    Last Pain: There were no vitals filed for this visit.               VAN STAVEREN,Rafan Sanders

## 2016-05-02 ENCOUNTER — Other Ambulatory Visit: Payer: Self-pay | Admitting: Family Medicine

## 2016-05-06 DIAGNOSIS — J301 Allergic rhinitis due to pollen: Secondary | ICD-10-CM | POA: Diagnosis not present

## 2016-05-13 DIAGNOSIS — J301 Allergic rhinitis due to pollen: Secondary | ICD-10-CM | POA: Diagnosis not present

## 2016-05-16 ENCOUNTER — Encounter: Payer: Self-pay | Admitting: Family Medicine

## 2016-05-16 ENCOUNTER — Ambulatory Visit (INDEPENDENT_AMBULATORY_CARE_PROVIDER_SITE_OTHER): Payer: PPO | Admitting: Family Medicine

## 2016-05-16 VITALS — BP 130/64 | HR 75 | Temp 98.4°F | Wt 235.2 lb

## 2016-05-16 DIAGNOSIS — N62 Hypertrophy of breast: Secondary | ICD-10-CM

## 2016-05-16 DIAGNOSIS — G4700 Insomnia, unspecified: Secondary | ICD-10-CM

## 2016-05-16 DIAGNOSIS — S46001A Unspecified injury of muscle(s) and tendon(s) of the rotator cuff of right shoulder, initial encounter: Secondary | ICD-10-CM

## 2016-05-16 DIAGNOSIS — M25561 Pain in right knee: Secondary | ICD-10-CM | POA: Diagnosis not present

## 2016-05-16 DIAGNOSIS — R4583 Excessive crying of child, adolescent or adult: Secondary | ICD-10-CM

## 2016-05-16 DIAGNOSIS — J301 Allergic rhinitis due to pollen: Secondary | ICD-10-CM | POA: Diagnosis not present

## 2016-05-16 DIAGNOSIS — F5104 Psychophysiologic insomnia: Secondary | ICD-10-CM

## 2016-05-16 MED ORDER — ZOLPIDEM TARTRATE ER 6.25 MG PO TBCR: 6.2500 mg | EXTENDED_RELEASE_TABLET | Freq: Every evening | ORAL | 2 refills | Status: DC | PRN

## 2016-05-16 NOTE — Assessment & Plan Note (Signed)
Significantly better following arthroscopy. She will continue to follow with orthopedic surgery.

## 2016-05-16 NOTE — Assessment & Plan Note (Signed)
Continues to have stable discomfort in her right shoulder though also notes bilateral trapezius muscle discomfort. She would like to continue to monitor the rotator cuff injury, though would like to see a plastic surgeon regarding breast reduction as she feels this would be beneficial for her shoulder discomfort and trapezius discomfort. Plastic surgery referral was placed.

## 2016-05-16 NOTE — Assessment & Plan Note (Signed)
She notes she is on Paxil for crying in the past. Notes this helps keep her level. We will continue this. Denies depression.

## 2016-05-16 NOTE — Assessment & Plan Note (Signed)
Stable. She is interested in trying alternative medication to try to get more sleep. We will trial continuous release Ambien. She will stop the [place]. If not improved with this she will call us in several weeks. Advised that this could make her drowsy and if it does that she should let us know.

## 2016-05-16 NOTE — Progress Notes (Signed)
Marikay Alar, MD Phone: [phone]  Crystal Haas is a 70 y.o. female who presents today for follow-up.  Right knee pain: Patient recently underwent arthroscopy and notes she had her right knee cleaned out. No pain at all now. Some intermittent minimal swelling. Not taking any pain medication for this. Ices nightly. Follows up with the orthopedic surgeon next month and is likely going to be released by them.  Insomnia: Notes taking Lunesta. Only gets about 4 hours of sleep on this. Occasionally gets up to 7 hours of sleep per night. Was previously on Ambien and trazodone at the same time and notes this made her too drowsy. Is interested in something to help her sleep for longer period of time. No depression.  Note notes she is on Paxil for crying. This helps keep her level. No depression.  Patient notes bilateral shoulder discomfort that she thinks is related to her large breasts. She does have a small tear in her right rotator cuff that is known for which she's been followed by orthopedics for this. She notes that is stable. She notes mostly discomfort is in her bilateral trapezius muscles. He feels that this is worsened by wearing a bra. Wants to see a plastic surgeon about a breast reduction.  PMH: Former smoker.   ROS see history of present illness  Objective  Physical Exam Vitals:   05/16/16 1326  BP: 130/64  Pulse: 75  Temp: 98.4 F (36.9 C)    BP Readings from Last 3 Encounters:  05/16/16 130/64  04/29/16 (!) 151/55  04/19/16 138/68   Wt Readings from Last 3 Encounters:  05/16/16 235 lb 3.2 oz (106.7 kg)  04/29/16 235 lb (106.6 kg)  04/19/16 235 lb (106.6 kg)    Physical Exam  Constitutional: No distress.  HENT:  Head: Normocephalic and atraumatic.  Cardiovascular: Normal rate, regular rhythm and normal heart sounds.   Pulmonary/Chest: Effort normal and breath sounds normal.  Musculoskeletal:  Right knee with minimal swelling, no erythema or warmth, no  tenderness, no ligament laxity, negative McMurray's, left knee with no swelling, erythema, or warmth, no tenderness Bilateral shoulders with with tenderness over lateral deltoids and mid trapezius area, discomfort on right side with internal rotation actively and passively, full range of motion bilateral shoulders, positive empty can on the right  Neurological: She is alert. Gait normal.  Skin: Skin is warm and dry. She is not diaphoretic.     Assessment/Plan: Please see individual problem list.  Right knee pain Significantly better following arthroscopy. She will continue to follow with orthopedic surgery.  Injury of right rotator cuff Continues to have stable discomfort in her right shoulder though also notes bilateral trapezius muscle discomfort. She would like to continue to monitor the rotator cuff injury, though would like to see a plastic surgeon regarding breast reduction as she feels this would be beneficial for her shoulder discomfort and trapezius discomfort. Plastic surgery referral was placed.  Chronic insomnia Stable. She is interested in trying alternative medication to try to get more sleep. We will trial continuous release Ambien. She will stop the [place]. If not improved with this she will call us in several weeks. Advised that this could make her drowsy and if it does that she should let us know.  Excessive crying She notes she is on Paxil for crying in the past. Notes this helps keep her level. We will continue this. Denies depression.   Orders Placed This Encounter  Procedures  . Ambulatory referral to Plastic Surgery  Referral Priority:   Routine    Referral Type:   Surgical    Referral Reason:   Specialty Services Required    Requested Specialty:   Plastic Surgery    Number of Visits Requested:   1    Meds ordered this encounter  Medications  . zolpidem (AMBIEN CR) 6.25 MG CR tablet    Sig: Take 1 tablet (6.25 mg total) by mouth at bedtime as needed for  sleep.    Dispense:  30 tablet    Refill:  2    Marikay Alar, MD [facility]

## 2016-05-16 NOTE — Progress Notes (Signed)
Pre visit review using our clinic review tool, if applicable. No additional management support is needed unless otherwise documented below in the visit note. 

## 2016-05-16 NOTE — Patient Instructions (Signed)
Nice to see you. We will switch you from Lunesta to Ambien continuous release. Please stop the Lunesta. Please keep your follow-up with her orthopedic surgeon. We will refer you to a plastic surgeon for your breasts. If you develop swelling of your knee, increased pain, worsening shoulder pain, or any new or changing symptoms please seek medical attention.

## 2016-05-20 DIAGNOSIS — J301 Allergic rhinitis due to pollen: Secondary | ICD-10-CM | POA: Diagnosis not present

## 2016-05-27 DIAGNOSIS — J301 Allergic rhinitis due to pollen: Secondary | ICD-10-CM | POA: Diagnosis not present

## 2016-06-03 DIAGNOSIS — J301 Allergic rhinitis due to pollen: Secondary | ICD-10-CM | POA: Diagnosis not present

## 2016-06-11 ENCOUNTER — Telehealth: Payer: Self-pay

## 2016-06-11 NOTE — Telephone Encounter (Signed)
Defer to ColbertSonnenberg on Return.

## 2016-06-11 NOTE — Telephone Encounter (Signed)
Received denial for Zolpidem tart er from [employer].  Please advise, thanks

## 2016-06-17 DIAGNOSIS — J301 Allergic rhinitis due to pollen: Secondary | ICD-10-CM | POA: Diagnosis not present

## 2016-06-18 ENCOUNTER — Telehealth: Payer: Self-pay | Admitting: Family Medicine

## 2016-06-18 NOTE — Telephone Encounter (Signed)
Please advise for increase in dose, thanks

## 2016-06-18 NOTE — Telephone Encounter (Signed)
Spoke with the patient and she agreed to the increase, [facility] [place]. thanks

## 2016-06-18 NOTE — Telephone Encounter (Signed)
Pt wants to know if Dr. Birdie SonsSonnenberg can up her dosage for Lunesta.

## 2016-06-18 NOTE — Telephone Encounter (Signed)
Pt called back returning your call. Thank you! °

## 2016-06-18 NOTE — Telephone Encounter (Signed)
Please inform the patient of this denial and see if she would like to try Lunesta again. Thanks.

## 2016-06-18 NOTE — Telephone Encounter (Signed)
We could consider trying 3 mg though patient would need to monitor for drowsiness and excessive sleepiness on this. If she would like I can send this to her pharmacy.

## 2016-06-18 NOTE — Telephone Encounter (Signed)
Left a VM to return my call, thanks 

## 2016-06-19 MED ORDER — ESZOPICLONE 3 MG PO TABS: 3.0000 mg | ORAL_TABLET | Freq: Every day | ORAL | 1 refills | Status: DC

## 2016-06-19 NOTE — Telephone Encounter (Signed)
Spoke with patient and she stated that someone had called her yesterday about this. She does want to increase the dose of the [place]un[place]

## 2016-06-19 NOTE — Telephone Encounter (Signed)
Prescription printed. Please fax to pharmacy.

## 2016-06-19 NOTE — Telephone Encounter (Signed)
Faxed, thanks

## 2016-06-25 DIAGNOSIS — J301 Allergic rhinitis due to pollen: Secondary | ICD-10-CM | POA: Diagnosis not present

## 2016-07-01 DIAGNOSIS — J301 Allergic rhinitis due to pollen: Secondary | ICD-10-CM | POA: Diagnosis not present

## 2016-07-05 DIAGNOSIS — N62 Hypertrophy of breast: Secondary | ICD-10-CM | POA: Diagnosis not present

## 2016-07-08 ENCOUNTER — Other Ambulatory Visit: Payer: Self-pay | Admitting: Family Medicine

## 2016-07-08 DIAGNOSIS — Z1231 Encounter for screening mammogram for malignant neoplasm of breast: Secondary | ICD-10-CM

## 2016-07-08 DIAGNOSIS — J301 Allergic rhinitis due to pollen: Secondary | ICD-10-CM | POA: Diagnosis not present

## 2016-07-10 ENCOUNTER — Ambulatory Visit
Admission: RE | Admit: 2016-07-10 | Discharge: 2016-07-10 | Disposition: A | Discharge status: Home / Self Care | Payer: PPO | Source: Ambulatory Visit | Attending: Family Medicine | Admitting: Family Medicine

## 2016-07-10 ENCOUNTER — Other Ambulatory Visit: Payer: Self-pay | Admitting: Family Medicine

## 2016-07-10 DIAGNOSIS — Z1231 Encounter for screening mammogram for malignant neoplasm of breast: Secondary | ICD-10-CM | POA: Diagnosis not present

## 2016-07-15 DIAGNOSIS — J301 Allergic rhinitis due to pollen: Secondary | ICD-10-CM | POA: Diagnosis not present

## 2016-07-16 ENCOUNTER — Other Ambulatory Visit: Payer: Self-pay | Admitting: Family Medicine

## 2016-07-16 NOTE — Telephone Encounter (Signed)
Please advise on refill.

## 2016-07-19 DIAGNOSIS — G4733 Obstructive sleep apnea (adult) (pediatric): Secondary | ICD-10-CM | POA: Diagnosis not present

## 2016-07-22 DIAGNOSIS — J301 Allergic rhinitis due to pollen: Secondary | ICD-10-CM | POA: Diagnosis not present

## 2016-07-29 DIAGNOSIS — J301 Allergic rhinitis due to pollen: Secondary | ICD-10-CM | POA: Diagnosis not present

## 2016-08-06 ENCOUNTER — Ambulatory Visit (INDEPENDENT_AMBULATORY_CARE_PROVIDER_SITE_OTHER): Payer: PPO | Admitting: Family Medicine

## 2016-08-06 ENCOUNTER — Encounter: Payer: Self-pay | Admitting: Family Medicine

## 2016-08-06 ENCOUNTER — Telehealth: Payer: Self-pay | Admitting: Family Medicine

## 2016-08-06 VITALS — BP 156/94 | HR 90 | Temp 98.0°F | Wt 235.4 lb

## 2016-08-06 DIAGNOSIS — R238 Other skin changes: Secondary | ICD-10-CM

## 2016-08-06 DIAGNOSIS — Z23 Encounter for immunization: Secondary | ICD-10-CM

## 2016-08-06 NOTE — Telephone Encounter (Signed)
Patient Name: Delene LollDIANE Edrington  DOB: 09/23/1946    Initial Comment Caller states her left foot is swelling. Blue in color. Leg swelling.   Nurse Assessment  Nurse: Renaldo FiddlerAdkins, RN, Raynelle FanningJulie Date/Time Lamount Cohen(Eastern Time): 08/06/2016 1:24:57 PM  Confirm and document reason for call. If symptomatic, describe symptoms. You must click the next button to save text entered. ---Caller states she has swelling in her left foot and lower leg, and her foot and ankle look "blue". States on Sunday night the whole foot was blue and swollen, now the spots of blue are random.  Has the patient traveled out of the country within the last 30 days? ---Not Applicable  Does the patient have any new or worsening symptoms? ---Yes  Will a triage be completed? ---Yes  Related visit to physician within the last 2 weeks? ---No  Does the PT have any chronic conditions? (i.e. diabetes, asthma, etc.) ---Yes  List chronic conditions. ---HX Meniscal repair rt knee( August, 2017) A fib, Depression,  Is this a behavioral health or substance abuse call? ---No     Guidelines    Guideline Title Affirmed Question Affirmed Notes  Leg Swelling and Edema [1] Thigh, calf, or ankle swelling AND [2] only 1 side    Final Disposition User   See Physician within 4 Hours (or PCP triage) Renaldo FiddlerAdkins, RN, Raynelle FanningJulie    Referrals  REFERRED TO PCP OFFICE   Disagree/Comply: Danella Maiersomply

## 2016-08-06 NOTE — Assessment & Plan Note (Signed)
New problem. Good cap refill and palpable dorsalis pedis pulse. Unlikely to be peripheral vascular disease. Patient is compliant with Eliquis making clot/DVT unlikely as well. Reassurance provided.

## 2016-08-06 NOTE — Progress Notes (Signed)
Pre visit review using our clinic review tool, if applicable. No additional management support is needed unless otherwise documented below in the visit note. 

## 2016-08-06 NOTE — Patient Instructions (Signed)
Exam unremarkable except for some mild bruising.  If this occurs again, please let us know.  Take care  Dr. Adriana Simasook

## 2016-08-06 NOTE — Telephone Encounter (Signed)
This is Dr. Patsey Bertholdook's 4pm visit, thanks

## 2016-08-06 NOTE — Progress Notes (Signed)
Subjective:  Patient ID: Crystal Haas, female    DOB: 03/22/1946  Age: 70 y.o. MRN: [id number]  CC: Left foot discoloration  HPI:  70 year old female with atrial fibrillation and who is recently status post arthroscopy of the right knee presents with the above complaint.  Patient states that over the weekend she was at a wedding and look down and noticed a blue discoloration of her left foot. No associated pain. No warmth or coolness. No other associated symptoms. She states that this slowly improved on its own. Since that time she noticed some continued discoloration of the forefoot. Once again, no reports of redness, warmth, or coolness to touch. No pain. She does note some lower extreme edema. She endorses compliance with her Eliquis. No other associated symptoms. No other complaints at this time.  Social Hx   Social History   Social History  . Marital status: Widowed    Spouse name: N/A  . Number of children: N/A  . Years of education: N/A   Social History Main Topics  . Smoking status: Former Smoker    Packs/day: 0.25    Types: Cigarettes    Quit date: 10/21/1969  . Smokeless tobacco: Never Used  . Alcohol use 4.2 oz/week    7 Shots of liquor per week     Comment: daily  . Drug use: No  . Sexual activity: Not Asked   Other Topics Concern  . None   Social History Narrative  . None   Review of Systems  Musculoskeletal: Negative.   Skin: Positive for color change.   Objective:  BP (!) 156/94 (BP Location: Right Arm, Patient Position: Sitting, Cuff Size: Large)   Pulse 90   Temp 98 F (36.7 C) (Oral)   Wt 235 lb 6 oz (106.8 kg)   SpO2 97%   BMI 39.17 kg/m   BP/Weight 08/06/2016 05/16/2016 04/29/2016  Systolic BP 156 130 151  Diastolic BP 94 64 55  Wt. (Lbs) 235.38 235.2 235  BMI 39.17 39.14 39.11   Physical Exam  Constitutional: She is oriented to person, place, and time. She appears well-developed. No distress.  Cardiovascular:  Left lower extremity with 1+  DP pulse. Could not appreciate PT pulse due to edema. Good cap refill.   Pulmonary/Chest: Effort normal.  Neurological: She is alert and oriented to person, place, and time.  Skin:  Mild bruising noted at the level of the MTP joints.   Psychiatric: She has a normal mood and affect.  Vitals reviewed.  Lab Results  Component Value Date   WBC 7.2 04/19/2016   HGB 13.5 04/19/2016   HCT 40.2 04/19/2016   PLT 200 04/19/2016   GLUCOSE 102 (H) 09/04/2015   CHOL 186 09/04/2015   TRIG 103 09/04/2015   HDL 66 09/04/2015   LDLCALC 99 09/04/2015   ALT 17 09/04/2015   AST 14 09/04/2015   NA 146 (H) 09/04/2015   K 5.9 (H) 09/04/2015   CL 105 09/04/2015   CREATININE 1.02 (H) 09/04/2015   BUN 15 09/04/2015   CO2 27 09/04/2015   TSH 3.370 09/04/2015   Assessment & Plan:   Problem List Items Addressed This Visit    Change of skin color - Primary    New problem. Good cap refill and palpable dorsalis pedis pulse. Unlikely to be peripheral vascular disease. Patient is compliant with Eliquis making clot/DVT unlikely as well. Reassurance provided.       Other Visit Diagnoses   None.    Follow-up:  [place]

## 2016-08-12 DIAGNOSIS — J301 Allergic rhinitis due to pollen: Secondary | ICD-10-CM | POA: Diagnosis not present

## 2016-08-19 ENCOUNTER — Other Ambulatory Visit: Payer: Self-pay | Admitting: Family Medicine

## 2016-08-19 DIAGNOSIS — J301 Allergic rhinitis due to pollen: Secondary | ICD-10-CM | POA: Diagnosis not present

## 2016-08-19 NOTE — Telephone Encounter (Signed)
Please advise on refill.

## 2016-08-20 ENCOUNTER — Other Ambulatory Visit: Payer: Self-pay

## 2016-08-20 MED ORDER — ESZOPICLONE 2 MG PO TABS: ORAL_TABLET | ORAL | 0 refills | Status: DC

## 2016-08-20 NOTE — Telephone Encounter (Signed)
lunesta faxed to [facility] in [place]

## 2016-08-20 NOTE — Telephone Encounter (Signed)
Refill given. Please faxed to pharmacy. 

## 2016-08-21 ENCOUNTER — Telehealth: Payer: Self-pay | Admitting: Cardiovascular Disease

## 2016-08-21 ENCOUNTER — Telehealth: Payer: Self-pay | Admitting: Family Medicine

## 2016-08-21 NOTE — Telephone Encounter (Signed)
LM for Kim that Dr. Kirke CorinArida manages the patients eliquis and that Dr. Birdie SonsSonnenberg would defer the questions to his office.

## 2016-08-21 NOTE — Telephone Encounter (Signed)
Kim called back returning your call. Her questions were in regards to when the pt should stop eliquis prior to surgery and when she should start it back post surgery.

## 2016-08-21 NOTE — Telephone Encounter (Signed)
Kim from Dr. Kittie Platerillingham's office called and had a few questions regarding patient and eliquis. Patient is having surgery on 11/9 on a bilateral breast reduction. Please call.  Call Kim - [phone]

## 2016-08-21 NOTE — Telephone Encounter (Signed)
Please advise 

## 2016-08-21 NOTE — Telephone Encounter (Signed)
Dr Kirke CorinArida is managing her eliquis. I would defer this to him. Please inform the surgeons office of this. Thanks.

## 2016-08-21 NOTE — Telephone Encounter (Signed)
Spoke w/ Selena BattenKim who requested recommendations for surgery. Request for surgical clearance:  1. What type of surgery is being performed? Bilateral breast reduction  2. When is this surgery scheduled? 08/29/16  3. Are there any medications that need to be held prior to surgery and how long? Eliquis 2-3 hold prior  4. Name of physician performing surgery? Dr. Ulice Boldillingham  5. What is your office phone and fax number? Ph: [phone] Fax: [phone]  Routed to Dr. Kirke CorinArida to review.

## 2016-08-21 NOTE — Telephone Encounter (Signed)
LMTRC

## 2016-08-23 ENCOUNTER — Ambulatory Visit: Payer: Self-pay | Admitting: Plastic Surgery

## 2016-08-23 ENCOUNTER — Other Ambulatory Visit: Payer: Self-pay

## 2016-08-23 ENCOUNTER — Encounter (HOSPITAL_BASED_OUTPATIENT_CLINIC_OR_DEPARTMENT_OTHER)
Admission: RE | Admit: 2016-08-23 | Discharge: 2016-08-23 | Disposition: A | Discharge status: Home / Self Care | Payer: PPO | Source: Ambulatory Visit | Attending: Plastic Surgery | Admitting: Plastic Surgery

## 2016-08-23 ENCOUNTER — Encounter (HOSPITAL_BASED_OUTPATIENT_CLINIC_OR_DEPARTMENT_OTHER): Payer: Self-pay | Admitting: *Deleted

## 2016-08-23 DIAGNOSIS — I4891 Unspecified atrial fibrillation: Secondary | ICD-10-CM | POA: Diagnosis not present

## 2016-08-23 DIAGNOSIS — N62 Hypertrophy of breast: Secondary | ICD-10-CM

## 2016-08-23 DIAGNOSIS — Z01810 Encounter for preprocedural cardiovascular examination: Secondary | ICD-10-CM | POA: Insufficient documentation

## 2016-08-23 NOTE — Telephone Encounter (Signed)
F/u Message  Kim call to f/u on surgical clearance. Please call back to discuss

## 2016-08-23 NOTE — Telephone Encounter (Signed)
Low risk. Hold Eliquis 2 days before and resume after.

## 2016-08-23 NOTE — Progress Notes (Signed)
Pt. In for pre-op EKG.  Reviewed with prior EKGs with Dr. Aleene DavidsonE. Fitzgerald.  Ok for outpt surgery per anesthesia.

## 2016-08-23 NOTE — Telephone Encounter (Signed)
Notification of clearance at low risk with med hold instructions was routed to provider, communication relayed to [facility] via VM, instructed to call if questions or concerns.

## 2016-08-23 NOTE — Telephone Encounter (Signed)
Left msg for Crystal Haas to notify this has been sent to Dr. Kirke CorinArida for review.

## 2016-08-26 DIAGNOSIS — J301 Allergic rhinitis due to pollen: Secondary | ICD-10-CM | POA: Diagnosis not present

## 2016-08-27 ENCOUNTER — Other Ambulatory Visit: Payer: Self-pay | Admitting: Family Medicine

## 2016-08-27 DIAGNOSIS — F329 Major depressive disorder, single episode, unspecified: Secondary | ICD-10-CM

## 2016-08-27 DIAGNOSIS — F32A Depression, unspecified: Secondary | ICD-10-CM

## 2016-08-28 ENCOUNTER — Other Ambulatory Visit: Payer: Self-pay | Admitting: Family Medicine

## 2016-08-28 DIAGNOSIS — F329 Major depressive disorder, single episode, unspecified: Secondary | ICD-10-CM

## 2016-08-28 DIAGNOSIS — F32A Depression, unspecified: Secondary | ICD-10-CM

## 2016-08-29 ENCOUNTER — Ambulatory Visit (HOSPITAL_BASED_OUTPATIENT_CLINIC_OR_DEPARTMENT_OTHER): Payer: PPO | Admitting: Anesthesiology

## 2016-08-29 ENCOUNTER — Encounter (HOSPITAL_BASED_OUTPATIENT_CLINIC_OR_DEPARTMENT_OTHER): Payer: Self-pay | Admitting: *Deleted

## 2016-08-29 ENCOUNTER — Ambulatory Visit (HOSPITAL_BASED_OUTPATIENT_CLINIC_OR_DEPARTMENT_OTHER)
Admission: RE | Admit: 2016-08-29 | Discharge: 2016-08-30 | Disposition: A | Discharge status: Home / Self Care | Payer: PPO | Source: Ambulatory Visit | Attending: Plastic Surgery | Admitting: Plastic Surgery

## 2016-08-29 ENCOUNTER — Encounter (HOSPITAL_BASED_OUTPATIENT_CLINIC_OR_DEPARTMENT_OTHER)
Admission: RE | Disposition: A | Discharge status: Home / Self Care | Payer: Self-pay | Source: Ambulatory Visit | Attending: Plastic Surgery

## 2016-08-29 DIAGNOSIS — N6011 Diffuse cystic mastopathy of right breast: Secondary | ICD-10-CM | POA: Diagnosis not present

## 2016-08-29 DIAGNOSIS — N62 Hypertrophy of breast: Secondary | ICD-10-CM | POA: Diagnosis present

## 2016-08-29 DIAGNOSIS — M549 Dorsalgia, unspecified: Secondary | ICD-10-CM | POA: Diagnosis not present

## 2016-08-29 DIAGNOSIS — Z6837 Body mass index (BMI) 37.0-37.9, adult: Secondary | ICD-10-CM | POA: Insufficient documentation

## 2016-08-29 DIAGNOSIS — Z9842 Cataract extraction status, left eye: Secondary | ICD-10-CM | POA: Diagnosis not present

## 2016-08-29 DIAGNOSIS — M1389 Other specified arthritis, multiple sites: Secondary | ICD-10-CM | POA: Insufficient documentation

## 2016-08-29 DIAGNOSIS — I4891 Unspecified atrial fibrillation: Secondary | ICD-10-CM | POA: Diagnosis not present

## 2016-08-29 DIAGNOSIS — Z7901 Long term (current) use of anticoagulants: Secondary | ICD-10-CM | POA: Diagnosis not present

## 2016-08-29 DIAGNOSIS — F419 Anxiety disorder, unspecified: Secondary | ICD-10-CM | POA: Diagnosis not present

## 2016-08-29 DIAGNOSIS — G4730 Sleep apnea, unspecified: Secondary | ICD-10-CM | POA: Diagnosis not present

## 2016-08-29 DIAGNOSIS — Z87891 Personal history of nicotine dependence: Secondary | ICD-10-CM | POA: Diagnosis not present

## 2016-08-29 DIAGNOSIS — L819 Disorder of pigmentation, unspecified: Secondary | ICD-10-CM | POA: Diagnosis not present

## 2016-08-29 DIAGNOSIS — N6012 Diffuse cystic mastopathy of left breast: Secondary | ICD-10-CM | POA: Diagnosis not present

## 2016-08-29 DIAGNOSIS — Z888 Allergy status to other drugs, medicaments and biological substances status: Secondary | ICD-10-CM | POA: Diagnosis not present

## 2016-08-29 DIAGNOSIS — Z91041 Radiographic dye allergy status: Secondary | ICD-10-CM | POA: Insufficient documentation

## 2016-08-29 DIAGNOSIS — M542 Cervicalgia: Secondary | ICD-10-CM | POA: Insufficient documentation

## 2016-08-29 DIAGNOSIS — Z91018 Allergy to other foods: Secondary | ICD-10-CM | POA: Insufficient documentation

## 2016-08-29 DIAGNOSIS — F329 Major depressive disorder, single episode, unspecified: Secondary | ICD-10-CM | POA: Insufficient documentation

## 2016-08-29 DIAGNOSIS — I482 Chronic atrial fibrillation: Secondary | ICD-10-CM | POA: Diagnosis not present

## 2016-08-29 HISTORY — PX: BREAST REDUCTION SURGERY: SHX8

## 2016-08-29 HISTORY — DX: Other seasonal allergic rhinitis: J30.2

## 2016-08-29 SURGERY — BREAST REDUCTION WITH LIPOSUCTION
Anesthesia: General | Site: Breast | Laterality: Bilateral

## 2016-08-29 MED ORDER — FLUTICASONE PROPIONATE 50 MCG/ACT NA SUSP: 2.0000 | Freq: Every day | NASAL | Status: DC

## 2016-08-29 MED ORDER — PHENYLEPHRINE 40 MCG/ML (10ML) SYRINGE FOR IV PUSH (FOR BLOOD PRESSURE SUPPORT)
PREFILLED_SYRINGE | INTRAVENOUS | Status: AC
  Filled 2016-08-29: qty 10

## 2016-08-29 MED ORDER — MIDAZOLAM HCL 2 MG/2ML IJ SOLN
1.0000 mg | INTRAMUSCULAR | Status: DC | PRN
  Administered 2016-08-29: 2 mg via INTRAVENOUS

## 2016-08-29 MED ORDER — LIDOCAINE 2% (20 MG/ML) 5 ML SYRINGE
INTRAMUSCULAR | Status: AC
  Filled 2016-08-29: qty 5

## 2016-08-29 MED ORDER — LIDOCAINE HCL (PF) 1 % IJ SOLN
INTRAMUSCULAR | Status: AC
  Filled 2016-08-29: qty 30

## 2016-08-29 MED ORDER — FENTANYL CITRATE (PF) 100 MCG/2ML IJ SOLN
50.0000 ug | INTRAMUSCULAR | Status: AC | PRN
  Administered 2016-08-29: 50 ug via INTRAVENOUS
  Administered 2016-08-29: 100 ug via INTRAVENOUS
  Administered 2016-08-29 (×4): 50 ug via INTRAVENOUS

## 2016-08-29 MED ORDER — HYDROCODONE-ACETAMINOPHEN 5-325 MG PO TABS
1.0000 | ORAL_TABLET | ORAL | Status: DC | PRN
  Administered 2016-08-29 – 2016-08-30 (×2): 2 via ORAL
  Filled 2016-08-29 (×2): qty 2

## 2016-08-29 MED ORDER — LIDOCAINE HCL (CARDIAC) 20 MG/ML IV SOLN
INTRAVENOUS | Status: DC | PRN
  Administered 2016-08-29: 50 mg via INTRAVENOUS

## 2016-08-29 MED ORDER — LABETALOL HCL 5 MG/ML IV SOLN
INTRAVENOUS | Status: DC | PRN
  Administered 2016-08-29 (×3): 5 mg via INTRAVENOUS

## 2016-08-29 MED ORDER — MIDAZOLAM HCL 2 MG/2ML IJ SOLN
INTRAMUSCULAR | Status: AC
  Filled 2016-08-29: qty 2

## 2016-08-29 MED ORDER — PAROXETINE HCL 20 MG PO TABS
20.0000 mg | ORAL_TABLET | Freq: Every day | ORAL | Status: DC
  Administered 2016-08-29: 20 mg via ORAL

## 2016-08-29 MED ORDER — CHLORHEXIDINE GLUCONATE CLOTH 2 % EX PADS: 6.0000 | MEDICATED_PAD | Freq: Once | CUTANEOUS | Status: DC

## 2016-08-29 MED ORDER — CEFAZOLIN SODIUM-DEXTROSE 2-4 GM/100ML-% IV SOLN
INTRAVENOUS | Status: AC
  Filled 2016-08-29: qty 100

## 2016-08-29 MED ORDER — KCL IN DEXTROSE-NACL 20-5-0.45 MEQ/L-%-% IV SOLN
INTRAVENOUS | Status: DC
  Administered 2016-08-29: 13:00:00 via INTRAVENOUS
  Filled 2016-08-29: qty 1000

## 2016-08-29 MED ORDER — ATROPINE SULFATE 0.4 MG/ML IV SOSY
PREFILLED_SYRINGE | INTRAVENOUS | Status: AC
  Filled 2016-08-29: qty 2.5

## 2016-08-29 MED ORDER — CEFAZOLIN SODIUM-DEXTROSE 2-4 GM/100ML-% IV SOLN
2.0000 g | Freq: Three times a day (TID) | INTRAVENOUS | Status: DC
  Administered 2016-08-29 (×2): 2 g via INTRAVENOUS
  Filled 2016-08-29 (×2): qty 100

## 2016-08-29 MED ORDER — ONDANSETRON HCL 4 MG/2ML IJ SOLN
INTRAMUSCULAR | Status: AC
  Filled 2016-08-29: qty 2

## 2016-08-29 MED ORDER — PROPOFOL 10 MG/ML IV BOLUS
INTRAVENOUS | Status: AC
  Filled 2016-08-29: qty 20

## 2016-08-29 MED ORDER — DOCUSATE SODIUM 100 MG PO CAPS
100.0000 mg | ORAL_CAPSULE | Freq: Two times a day (BID) | ORAL | Status: DC
  Filled 2016-08-29: qty 1

## 2016-08-29 MED ORDER — ACETAMINOPHEN 10 MG/ML IV SOLN
INTRAVENOUS | Status: AC
Start: 2016-08-29 — End: 2016-08-29
  Filled 2016-08-29: qty 100

## 2016-08-29 MED ORDER — FENTANYL CITRATE (PF) 100 MCG/2ML IJ SOLN
INTRAMUSCULAR | Status: AC
  Filled 2016-08-29: qty 2

## 2016-08-29 MED ORDER — LIDOCAINE HCL (PF) 1 % IJ SOLN
INTRAMUSCULAR | Status: AC
  Filled 2016-08-29: qty 60

## 2016-08-29 MED ORDER — LIDOCAINE-EPINEPHRINE 1 %-1:100000 IJ SOLN
INTRAMUSCULAR | Status: DC | PRN
  Administered 2016-08-29: 16 mL

## 2016-08-29 MED ORDER — POLYETHYLENE GLYCOL 3350 17 G PO PACK: 17.0000 g | PACK | Freq: Every day | ORAL | Status: DC | PRN

## 2016-08-29 MED ORDER — LACTATED RINGERS IV SOLN
INTRAVENOUS | Status: DC | PRN
Start: 2016-08-29 — End: 2016-08-29
  Administered 2016-08-29: 500 mL via INTRAVENOUS

## 2016-08-29 MED ORDER — SODIUM CHLORIDE 0.9 % IR SOLN
Status: DC | PRN
  Administered 2016-08-29: 500 mL

## 2016-08-29 MED ORDER — LIDOCAINE HCL 1 % IJ SOLN
INTRAMUSCULAR | Status: DC | PRN
  Administered 2016-08-29: 50 mL

## 2016-08-29 MED ORDER — DEXAMETHASONE SODIUM PHOSPHATE 4 MG/ML IJ SOLN
INTRAMUSCULAR | Status: DC | PRN
  Administered 2016-08-29: 10 mg via INTRAVENOUS

## 2016-08-29 MED ORDER — ACETAMINOPHEN 500 MG PO TABS
1000.0000 mg | ORAL_TABLET | Freq: Four times a day (QID) | ORAL | Status: DC
  Administered 2016-08-29: 1000 mg via ORAL
  Filled 2016-08-29: qty 2

## 2016-08-29 MED ORDER — LACTATED RINGERS IV SOLN
INTRAVENOUS | Status: DC
  Administered 2016-08-29 (×3): via INTRAVENOUS

## 2016-08-29 MED ORDER — BUPIVACAINE-EPINEPHRINE (PF) 0.5% -1:200000 IJ SOLN
INTRAMUSCULAR | Status: AC
  Filled 2016-08-29: qty 1.8

## 2016-08-29 MED ORDER — ACETAMINOPHEN 10 MG/ML IV SOLN
INTRAVENOUS | Status: DC | PRN
  Administered 2016-08-29: 1000 mg via INTRAVENOUS

## 2016-08-29 MED ORDER — ONDANSETRON HCL 4 MG/2ML IJ SOLN
INTRAMUSCULAR | Status: DC | PRN
  Administered 2016-08-29: 4 mg via INTRAVENOUS

## 2016-08-29 MED ORDER — ONDANSETRON 4 MG PO TBDP: 4.0000 mg | ORAL_TABLET | Freq: Four times a day (QID) | ORAL | Status: DC | PRN

## 2016-08-29 MED ORDER — PROPOFOL 10 MG/ML IV BOLUS
INTRAVENOUS | Status: DC | PRN
  Administered 2016-08-29: 150 mg via INTRAVENOUS

## 2016-08-29 MED ORDER — ROCURONIUM BROMIDE 100 MG/10ML IV SOLN
INTRAVENOUS | Status: DC | PRN
  Administered 2016-08-29 (×2): 10 mg via INTRAVENOUS
  Administered 2016-08-29: 50 mg via INTRAVENOUS

## 2016-08-29 MED ORDER — PROMETHAZINE HCL 25 MG/ML IJ SOLN: 6.2500 mg | INTRAMUSCULAR | Status: DC | PRN

## 2016-08-29 MED ORDER — NITROGLYCERIN 2 % TD OINT
0.5000 [in_us] | TOPICAL_OINTMENT | Freq: Two times a day (BID) | TRANSDERMAL | Status: DC
  Administered 2016-08-29: 0.5 [in_us] via TOPICAL

## 2016-08-29 MED ORDER — ZOLPIDEM TARTRATE 5 MG PO TABS
5.0000 mg | ORAL_TABLET | Freq: Every day | ORAL | Status: DC
Start: 2016-08-29 — End: 2016-08-30
  Administered 2016-08-29: 5 mg via ORAL
  Filled 2016-08-29: qty 1

## 2016-08-29 MED ORDER — NITROGLYCERIN 2 % TD OINT: 0.5000 [in_us] | TOPICAL_OINTMENT | Freq: Four times a day (QID) | TRANSDERMAL | Status: DC

## 2016-08-29 MED ORDER — ARTIFICIAL TEARS 0.1-0.3 % OP SOLN: 1.0000 [drp] | Freq: Every day | OPHTHALMIC | Status: DC

## 2016-08-29 MED ORDER — SUGAMMADEX SODIUM 200 MG/2ML IV SOLN
INTRAVENOUS | Status: DC | PRN
  Administered 2016-08-29: 200 mg via INTRAVENOUS

## 2016-08-29 MED ORDER — SCOPOLAMINE 1 MG/3DAYS TD PT72: 1.0000 | MEDICATED_PATCH | Freq: Once | TRANSDERMAL | Status: DC | PRN

## 2016-08-29 MED ORDER — LIDOCAINE-EPINEPHRINE 1 %-1:100000 IJ SOLN
INTRAMUSCULAR | Status: AC
  Filled 2016-08-29: qty 2

## 2016-08-29 MED ORDER — APIXABAN 5 MG PO TABS
5.0000 mg | ORAL_TABLET | Freq: Two times a day (BID) | ORAL | Status: DC
  Administered 2016-08-29: 5 mg via ORAL
  Filled 2016-08-29 (×2): qty 1

## 2016-08-29 MED ORDER — LABETALOL HCL 5 MG/ML IV SOLN
INTRAVENOUS | Status: AC
  Filled 2016-08-29: qty 4

## 2016-08-29 MED ORDER — EPHEDRINE 5 MG/ML INJ
INTRAVENOUS | Status: AC
Start: 2016-08-29 — End: 2016-08-29
  Filled 2016-08-29: qty 10

## 2016-08-29 MED ORDER — EPINEPHRINE 30 MG/30ML IJ SOLN
INTRAMUSCULAR | Status: AC
  Filled 2016-08-29: qty 1

## 2016-08-29 MED ORDER — SUCCINYLCHOLINE CHLORIDE 200 MG/10ML IV SOSY
PREFILLED_SYRINGE | INTRAVENOUS | Status: AC
  Filled 2016-08-29: qty 10

## 2016-08-29 MED ORDER — ROCURONIUM BROMIDE 10 MG/ML (PF) SYRINGE
PREFILLED_SYRINGE | INTRAVENOUS | Status: AC
  Filled 2016-08-29: qty 10

## 2016-08-29 MED ORDER — HYDROMORPHONE HCL 1 MG/ML IJ SOLN: 0.2500 mg | INTRAMUSCULAR | Status: DC | PRN

## 2016-08-29 MED ORDER — DEXAMETHASONE SODIUM PHOSPHATE 10 MG/ML IJ SOLN
INTRAMUSCULAR | Status: AC
  Filled 2016-08-29: qty 1

## 2016-08-29 MED ORDER — HYDROMORPHONE HCL 1 MG/ML IJ SOLN: 1.0000 mg | INTRAMUSCULAR | Status: DC | PRN

## 2016-08-29 MED ORDER — CEFAZOLIN SODIUM-DEXTROSE 2-4 GM/100ML-% IV SOLN
2.0000 g | INTRAVENOUS | Status: AC
  Administered 2016-08-29: 2 g via INTRAVENOUS

## 2016-08-29 MED ORDER — ONDANSETRON HCL 4 MG/2ML IJ SOLN: 4.0000 mg | Freq: Four times a day (QID) | INTRAMUSCULAR | Status: DC | PRN

## 2016-08-29 MED ORDER — EPINEPHRINE PF 1 MG/ML IJ SOLN
INTRAMUSCULAR | Status: DC | PRN
  Administered 2016-08-29: 1 mg

## 2016-08-29 MED ORDER — BACITRACIN ZINC 500 UNIT/GM EX OINT
TOPICAL_OINTMENT | CUTANEOUS | Status: AC
  Filled 2016-08-29: qty 28.35

## 2016-08-29 SURGICAL SUPPLY — 61 items
BAG DECANTER FOR FLEXI CONT (MISCELLANEOUS) ×3 IMPLANT
BINDER BREAST LRG (GAUZE/BANDAGES/DRESSINGS) IMPLANT
BINDER BREAST MEDIUM (GAUZE/BANDAGES/DRESSINGS) IMPLANT
BINDER BREAST XLRG (GAUZE/BANDAGES/DRESSINGS) IMPLANT
BINDER BREAST XXLRG (GAUZE/BANDAGES/DRESSINGS) ×6 IMPLANT
BLADE HEX COATED 2.75 (ELECTRODE) ×3 IMPLANT
BLADE KNIFE PERSONA 10 (BLADE) ×6 IMPLANT
BLADE SURG 15 STRL LF DISP TIS (BLADE) IMPLANT
BLADE SURG 15 STRL SS (BLADE)
BNDG GAUZE ELAST 4 BULKY (GAUZE/BANDAGES/DRESSINGS) ×6 IMPLANT
CANISTER SUCT 1200ML W/VALVE (MISCELLANEOUS) ×3 IMPLANT
CHLORAPREP W/TINT 26ML (MISCELLANEOUS) ×3 IMPLANT
COVER BACK TABLE 60X90IN (DRAPES) ×3 IMPLANT
COVER MAYO STAND STRL (DRAPES) ×3 IMPLANT
DECANTER SPIKE VIAL GLASS SM (MISCELLANEOUS) ×3 IMPLANT
DERMABOND ADVANCED (GAUZE/BANDAGES/DRESSINGS) ×4
DERMABOND ADVANCED .7 DNX12 (GAUZE/BANDAGES/DRESSINGS) ×8 IMPLANT
DRAIN CHANNEL 19F RND (DRAIN) ×6 IMPLANT
DRAPE LAPAROSCOPIC ABDOMINAL (DRAPES) ×3 IMPLANT
DRSG PAD ABDOMINAL 8X10 ST (GAUZE/BANDAGES/DRESSINGS) ×6 IMPLANT
ELECT BLADE 4.0 EZ CLEAN MEGAD (MISCELLANEOUS) ×3
ELECT REM PT RETURN 9FT ADLT (ELECTROSURGICAL) ×3
ELECTRODE BLDE 4.0 EZ CLN MEGD (MISCELLANEOUS) ×2 IMPLANT
ELECTRODE REM PT RTRN 9FT ADLT (ELECTROSURGICAL) ×2 IMPLANT
EVACUATOR SILICONE 100CC (DRAIN) ×6 IMPLANT
FILTER LIPOSUCTION (MISCELLANEOUS) ×3 IMPLANT
GLOVE BIO SURGEON STRL SZ 6.5 (GLOVE) ×6 IMPLANT
GOWN STRL REUS W/ TWL LRG LVL3 (GOWN DISPOSABLE) ×4 IMPLANT
GOWN STRL REUS W/TWL LRG LVL3 (GOWN DISPOSABLE) ×2
NDL SAFETY ECLIPSE 18X1.5 (NEEDLE) IMPLANT
NEEDLE HYPO 18GX1.5 SHARP (NEEDLE)
NEEDLE HYPO 25X1 1.5 SAFETY (NEEDLE) ×3 IMPLANT
NS IRRIG 1000ML POUR BTL (IV SOLUTION) ×3 IMPLANT
PACK BASIN DAY SURGERY FS (CUSTOM PROCEDURE TRAY) ×3 IMPLANT
PAD ALCOHOL SWAB (MISCELLANEOUS) IMPLANT
PENCIL BUTTON HOLSTER BLD 10FT (ELECTRODE) ×3 IMPLANT
SLEEVE SCD COMPRESS KNEE MED (MISCELLANEOUS) ×3 IMPLANT
SPONGE LAP 18X18 X RAY DECT (DISPOSABLE) ×24 IMPLANT
STRIP SUTURE WOUND CLOSURE 1/2 (SUTURE) ×6 IMPLANT
SUT MNCRL AB 4-0 PS2 18 (SUTURE) ×15 IMPLANT
SUT MON AB 3-0 SH 27 (SUTURE) ×3
SUT MON AB 3-0 SH27 (SUTURE) ×6 IMPLANT
SUT MON AB 5-0 PS2 18 (SUTURE) ×24 IMPLANT
SUT PDS 3-0 CT2 (SUTURE)
SUT PDS AB 2-0 CT2 27 (SUTURE) IMPLANT
SUT PDS II 3-0 CT2 27 ABS (SUTURE) IMPLANT
SUT SILK 3 0 PS 1 (SUTURE) ×6 IMPLANT
SUT VIC AB 3-0 SH 27 (SUTURE) ×2
SUT VIC AB 3-0 SH 27X BRD (SUTURE) ×4 IMPLANT
SUT VICRYL 4-0 PS2 18IN ABS (SUTURE) ×18 IMPLANT
SYR 3ML 23GX1 SAFETY (SYRINGE) ×3 IMPLANT
SYR 50ML LL SCALE MARK (SYRINGE) IMPLANT
SYR BULB IRRIGATION 50ML (SYRINGE) ×3 IMPLANT
SYR CONTROL 10ML LL (SYRINGE) ×3 IMPLANT
TAPE MEASURE VINYL STERILE (MISCELLANEOUS) ×3 IMPLANT
TOWEL OR 17X24 6PK STRL BLUE (TOWEL DISPOSABLE) ×6 IMPLANT
TUBE CONNECTING 20X1/4 (TUBING) ×3 IMPLANT
TUBING INFILTRATION IT-10001 (TUBING) ×3 IMPLANT
TUBING SET GRADUATE ASPIR 12FT (MISCELLANEOUS) ×3 IMPLANT
UNDERPAD 30X30 (UNDERPADS AND DIAPERS) ×6 IMPLANT
YANKAUER SUCT BULB TIP NO VENT (SUCTIONS) ×3 IMPLANT

## 2016-08-29 NOTE — Anesthesia Procedure Notes (Signed)
Procedure Name: Intubation Date/Time: 08/29/2016 7:53 AM Performed by: Zenia ResidesPAYNE, Deyon Chizek D Pre-anesthesia Checklist: Patient identified, Emergency Drugs available, Suction available and Patient being monitored Patient Re-evaluated:Patient Re-evaluated prior to inductionOxygen Delivery Method: Circle system utilized Preoxygenation: Pre-oxygenation with 100% oxygen Intubation Type: IV induction Ventilation: Mask ventilation without difficulty Laryngoscope Size: Mac and 3 Grade View: Grade I Tube type: Oral Number of attempts: 1 Airway Equipment and Method: Stylet and Oral airway Placement Confirmation: ETT inserted through vocal cords under direct vision,  positive ETCO2 and breath sounds checked- equal and bilateral Secured at: 22 cm Tube secured with: Tape Dental Injury: Teeth and Oropharynx as per pre-operative assessment

## 2016-08-29 NOTE — Anesthesia Postprocedure Evaluation (Signed)
Anesthesia Post Note  Patient: Crystal LollDiane Haas  Procedure(s) Performed: Procedure(s) (LRB): BILATERAL MAMMARY REDUCTION  (BREAST)WITH LIPOSUCTION AND FILLING (Bilateral)  Patient location during evaluation: PACU Anesthesia Type: General Level of consciousness: sedated and patient cooperative Pain management: pain level controlled Vital Signs Assessment: post-procedure vital signs reviewed and stable Respiratory status: spontaneous breathing Cardiovascular status: stable Anesthetic complications: no    Last Vitals:  Vitals:   08/29/16 1245 08/29/16 1300  BP: (!) 155/85 138/72  Pulse: 91 98  Resp: (!) 25 16  Temp:  36.8 C    Last Pain:  Vitals:   08/29/16 1300  TempSrc:   PainSc: 0-No pain                 Lewie LoronJohn Mazie Fencl

## 2016-08-29 NOTE — Brief Op Note (Signed)
08/29/2016  7:51 AM  PATIENT:  Crystal Haas  70 y.o. female  PRE-OPERATIVE DIAGNOSIS:  BREAST HYPERTROPHY  POST-OPERATIVE DIAGNOSIS:  BREAST HYPERTROPHY  PROCEDURE:  Procedure(s): BILATERAL MAMMARY REDUCTION  (BREAST) (Bilateral)  SURGEON:  Surgeon(s) and Role:    * Alena Billslaire S Bonney Berres, DO - Primary  PHYSICIAN ASSISTANT: Shawn Rayburn, PA  ASSISTANTS: none   ANESTHESIA:   general  EBL:  No intake/output data recorded.  BLOOD ADMINISTERED:none  DRAINS: (1) Jackson-Pratt drain(s) with closed bulb suction in the breast pocket on each side   LOCAL MEDICATIONS USED:  LIDOCAINE   SPECIMEN:  Source of Specimen:  breast tissue bilateral  DISPOSITION OF SPECIMEN:  PATHOLOGY  COUNTS:  YES  TOURNIQUET:  * No tourniquets in log *  DICTATION: .Dragon Dictation  PLAN OF CARE: Admit for overnight observation  PATIENT DISPOSITION:  PACU - hemodynamically stable.   Delay start of Pharmacological VTE agent (>24hrs) due to surgical blood loss or risk of bleeding: no

## 2016-08-29 NOTE — Discharge Instructions (Signed)
No heavy lifting Continue binder or sports bra  About my Jackson-Pratt Bulb Drain  What is a Jackson-Pratt bulb? A Jackson-Pratt is a soft, round device used to collect drainage. It is connected to a long, thin drainage catheter, which is held in place by one or two small stiches near your surgical incision site. When the bulb is squeezed, it forms a vacuum, forcing the drainage to empty into the bulb.  Emptying the Jackson-Pratt bulb- To empty the bulb: 1. Release the plug on the top of the bulb. 2. Pour the bulb's contents into a measuring container which your nurse will provide. 3. Record the time emptied and amount of drainage. Empty the drain(s) as often as your     doctor or nurse recommends.  Date                  Time                    Amount (Drain 1)                 Amount (Drain 2)  _____________________________________________________________________  _____________________________________________________________________  _____________________________________________________________________  _____________________________________________________________________  _____________________________________________________________________  _____________________________________________________________________  _____________________________________________________________________  _____________________________________________________________________  Squeezing the Jackson-Pratt Bulb- To squeeze the bulb: 1. Make sure the plug at the top of the bulb is open. 2. Squeeze the bulb tightly in your fist. You will hear air squeezing from the bulb. 3. Replace the plug while the bulb is squeezed. 4. Use a safety pin to attach the bulb to your clothing. This will keep the catheter from     pulling at the bulb insertion site.  When to call your doctor- Call your doctor if:  Drain site becomes red, swollen or hot.  You have a fever greater than 101 degrees F.  There is oozing at  the drain site.  Drain falls out (apply a guaze bandage over the drain hole and secure it with tape).  Drainage increases daily not related to activity patterns. (You will usually have more drainage when you are active than when you are resting.)  Drainage has a bad odor.

## 2016-08-29 NOTE — H&P (Signed)
Crystal Haas is an 70 y.o. female.   Chief Complaint: mammary hypertrophy HPI: Crystal Haas is a 70 y.o. female here with a friend for pre operative history and physical prior to bilateral breast reduction surgery. She presented with a history of mammary hyperplasia for several years.  She has extremely large breasts causing symptoms that include the following: Back pain (upper and lower) and neck pain. She frequently pins bra cups higher on straps for better lift and relief. Notices relief when holding breast up in her hands. Shoulder straps causing grooves, pain occasionally requiring padding. Pain medication is sometimes required with motrin and tylenol.  Her breasts are extremely large and fairly symmetric.  She has hyperpigmentation of the inframammary area on both sides.  The sternal to nipple distance on the right is 40 and the left is 40.  The IMF distance is 15 cm.  She is 5 feet 5 inches tall and weighs 235 pounds.  Preoperative bra size = 42 H-I cup.  Would like to be as small as possible. The estimated excess breast tissue to be removed at the time of surgery = 850 grams on the left and 850 grams on the right.  She is on Eliquis for Atrial fibrillation.  Her last mammogram was one year ago.  Past Medical History:  Diagnosis Date  . Anemia    distant past  . Anxiety   . Arthritis    "everywhere" - big toes worst  . Atrial fibrillation (HCC)   . Depression   . Dysrhythmia    a-fib  . Heart murmur    mild - followed by PCP  . Knee pain   . Motion sickness    back seat of car  . New onset a-fib (HCC)   . Seasonal allergies    takes allergy weekly  . Sleep apnea    [facility]    Past Surgical History:  Procedure Laterality Date  . CATARACT EXTRACTION W/PHACO Left 05/03/2015   Procedure: CATARACT EXTRACTION PHACO AND INTRAOCULAR LENS PLACEMENT (IOC);  Surgeon: Lockie Molahadwick Brasington, MD;  [facility];  Service: Ophthalmology;  Laterality: Left;  CPAP   . CHONDROPLASTY Right 04/29/2016   Procedure: CHONDROPLASTY;  Surgeon: Donato HeinzJames P Hooten, MD;  Location: [facility];  Service: Orthopedics;  Laterality: Right;  . EYE SURGERY    . KNEE ARTHROSCOPY WITH LATERAL MENISECTOMY  04/29/2016   Procedure: KNEE ARTHROSCOPY WITH LATERAL MENISECTOMY;  Surgeon: Donato HeinzJames P Hooten, MD;  Location: [facility] OR[facility];  Service: Orthopedics;;  . KNEE ARTHROSCOPY WITH MEDIAL MENISECTOMY  04/29/2016   Procedure: KNEE ARTHROSCOPY WITH MEDIAL MENISECTOMY;  Surgeon: Donato HeinzJames P Hooten, MD;  Location: [facility] OR[facility];  Service: Orthopedics;;  . RETINAL DETACHMENT SURGERY Left May 03, 2015   Dr. Inez PilgrimBrasington, [facility]  . TUBAL LIGATION      Family History  Problem Relation Age of Onset  . Alcoholism    . Heart disease    . Breast cancer Neg Hx    Social History:  reports that she quit smoking about 46 years ago. Her smoking use included Cigarettes. She smoked 0.25 packs per day. She has never used smokeless tobacco. She reports that she drinks about 4.2 oz of alcohol per week . She reports that she does not use drugs.  Allergies:  Allergies  Allergen Reactions  . Apple Anaphylaxis    Throat swells but subsides with po benadry  . Daucus Carota Anaphylaxis    Peeling of carrot only but subsides with  po benadry  . Ivp Dye [Iodinated Diagnostic Agents] Shortness Of Breath    Also swelling.  Topical betadine is OK.  . Other Swelling    Raw fruits cause throat to swell. Topical contact with carrots cause hands to swell, ok to eat.  . Strawberry (Diagnostic) Anaphylaxis    Throat swells but subsides with benadryl  . Strawberry Extract Anaphylaxis    Throat swells but subsides with benadryl  . Iodine Swelling    IV   . Tape Other (See Comments)    Most tapes case raw skin.  Paper tape is OK.    Medications Prior to Admission  Medication Sig Dispense Refill  . apixaban (ELIQUIS) 5 MG TABS tablet Take 1 tablet (5 mg total) by mouth 2 (two) times daily. 60 tablet 9  . ARTIFICIAL TEARS 0.1-0.3  % SOLN Place 1 drop into both eyes daily.  5  . cholecalciferol (VITAMIN D) 1000 UNITS tablet Take 2,000 Units by mouth daily.    . diphenhydramine-acetaminophen (TYLENOL PM) 25-500 MG TABS Take 1 tablet by mouth at bedtime as needed (AS NEEDED FOR SLEEP).     . eszopiclone (LUNESTA) 2 MG TABS tablet TAKE 1 TABLET BY MOUTH IMMEDIATELY BEFORE BEDTIME 30 tablet 0  . fexofenadine (ALLEGRA) 180 MG tablet Take 180 mg by mouth daily. PM    . fluticasone (FLONASE) 50 MCG/ACT nasal spray Place 2 sprays into both nostrils daily.     . Multiple Vitamins-Minerals (CENTRUM PO) Take 1 tablet by mouth daily. AM    . PARoxetine (PAXIL) 20 MG tablet TAKE 1 TABLET BY MOUTH EVERY DAY 90 tablet 0  . PATADAY 0.2 % SOLN Place 1 drop into both eyes daily.    Marland Kitchen. EPIPEN 2-PAK 0.3 MG/0.3ML SOAJ injection Inject as directed as directed. AS NEEDED FOR ANAPHYLAXIS      No results found for this or any previous visit (from the past 48 hour(s)). No results found.  Review of Systems  Constitutional: Negative.   HENT: Negative.   Eyes: Negative.   Respiratory: Negative.   Cardiovascular: Negative.   Gastrointestinal: Negative.   Genitourinary: Negative.   Musculoskeletal: Negative.   Skin: Negative.   Neurological: Negative.   Psychiatric/Behavioral: Negative.     Blood pressure (!) 143/66, pulse 88, temperature 98.2 F (36.8 C), temperature source Oral, resp. rate 18, height 5\' 5"  (1.651 m), weight 102.5 kg (226 lb), SpO2 97 %. Physical Exam  Constitutional: She is oriented to person, place, and time. She appears well-developed and well-nourished.  HENT:  Head: Normocephalic and atraumatic.  Eyes: EOM are normal. Pupils are equal, round, and reactive to light.  Cardiovascular: Normal rate.   Respiratory: Effort normal. No respiratory distress.  GI: Soft. She exhibits no distension. There is no tenderness.  Neurological: She is alert and oriented to person, place, and time.  Skin: Skin is warm.  Psychiatric:  She has a normal mood and affect. Her behavior is normal. Judgment and thought content normal.     Assessment/Plan Plan for bilateral breast reduction.  Peggye FormLAIRE S Imo Cumbie, DO 08/29/2016, 7:33 AM

## 2016-08-29 NOTE — Anesthesia Preprocedure Evaluation (Addendum)
Anesthesia Evaluation  Patient identified by MRN, date of birth, ID band Patient awake    Reviewed: Allergy & Precautions, NPO status , Patient's Chart, lab work & pertinent test results  Airway Mallampati: II  TM Distance: >3 FB Neck ROM: Full    Dental no notable dental hx.    Pulmonary sleep apnea , former smoker,    Pulmonary exam normal breath sounds clear to auscultation       Cardiovascular Normal cardiovascular exam+ dysrhythmias Atrial Fibrillation  Rhythm:Regular Rate:Normal  EF 55-60%   Neuro/Psych negative neurological ROS  negative psych ROS   GI/Hepatic negative GI ROS, Neg liver ROS,   Endo/Other  Morbid obesity  Renal/GU negative Renal ROS  negative genitourinary   Musculoskeletal negative musculoskeletal ROS (+)   Abdominal   Peds negative pediatric ROS (+)  Hematology negative hematology ROS (+)   Anesthesia Other Findings   Reproductive/Obstetrics negative OB ROS                            Anesthesia Physical Anesthesia Plan  ASA: III  Anesthesia Plan: General   Post-op Pain Management:    Induction: Intravenous  Airway Management Planned: Oral ETT  Additional Equipment:   Intra-op Plan:   Post-operative Plan: Extubation in OR  Informed Consent: I have reviewed the patients History and Physical, chart, labs and discussed the procedure including the risks, benefits and alternatives for the proposed anesthesia with the patient or authorized representative who has indicated his/her understanding and acceptance.   Dental advisory given  Plan Discussed with: CRNA and Surgeon  Anesthesia Plan Comments:         Anesthesia Quick Evaluation

## 2016-08-29 NOTE — Transfer of Care (Signed)
Immediate Anesthesia Transfer of Care Note  Patient: Crystal Haas  Procedure(s) Performed: Procedure(s): BILATERAL MAMMARY REDUCTION  (BREAST)WITH LIPOSUCTION AND FILLING (Bilateral)  Patient Location: PACU  Anesthesia Type:General  Level of Consciousness: sedated  Airway & Oxygen Therapy: Patient Spontanous Breathing and Patient connected to face mask oxygen  Post-op Assessment: Report given to RN and Post -op Vital signs reviewed and stable  Post vital signs: Reviewed and stable  Last Vitals:  Vitals:   08/29/16 0648  BP: (!) 143/66  Pulse: 88  Resp: 18  Temp: 36.8 C    Last Pain:  Vitals:   08/29/16 0648  TempSrc: Oral      Patients Stated Pain Goal: 0 (08/29/16 16100648)  Complications: No apparent anesthesia complications

## 2016-08-29 NOTE — Op Note (Signed)
Breast Reduction Op note:    DATE OF PROCEDURE: 08/29/2016  [facility]  SURGEON: Alan Ripperlaire Sanger Dillingham, DO  ASSISTANT: Shawn Rayburn, PA  PREOPERATIVE DIAGNOSIS 1. Macromastia 2. Neck Pain 3. Back Pain  POSTOPERATIVE DIAGNOSIS 1. Macromastia 2. Neck Pain 3. Back Pain  PROCEDURES 1. Bilateral breast reduction.  Right reduction 1319g, Left reduction 1302g  COMPLICATIONS: None.  DRAINS: bilateral breast pocket  INDICATIONS FOR PROCEDURE Crystal Haas is a 70 y.o. year old female born on 07/23/1946, with a history of symptomatic macromastia with concominant back pain, neck pain, shoulder grooving from her bra.   MRN: [id number]  CONSENT Informed consent was obtained directly from the patient. The risks, benefits and alternatives were fully discussed. Specific risks including but not limited to bleeding, infection, hematoma, seroma, scarring, pain, nipple necrosis, asymmetry, poor cosmetic results, and need for further surgery were discussed. The patient had ample opportunity to have her questions answered to her satisfaction.  DESCRIPTION OF PROCEDURE  Patient was brought into the operating room and placed in a supine position.  SCDs were placed and appropriate padding was performed.  Antibiotics were given. The patient underwent general anesthesia and the chest was prepped and draped in a sterile fashion.  A timeout was performed and all information was confirmed to be correct. Tumescent was placed at the lateral aspect of the breasts.  Right: Preoperative markings were confirmed.  Incision lines were injected with 1% Xylocaine with epinephrine.  After waiting for vasoconstriction, the marked lines were incised.  An inferior pedical breast reduction was performed by de-epithelializing the pedicle, using bovie to create the lateral and medial pedicles, and removing breast tissue from the superior, lateral, and medial portions of the breast.  Care  was taken to not undermine the breast pedicle. Hemostasis was achieved.  The nipple was gently rotated into position and the skin was temporarily closed with staples.  The patient was sat upright and size and shape symmetry was confirmed.  The pocket was irrigated, a drain, and hemostasis confirmed.  Liposuction was done laterally to help with the contour.  The deep tissues were approximated with 3-0 Vicryl sutures and the skin was closed with deep dermal and subcuticular 4-0 Monocryl sutures followed by 5-0 Monocryl.  The nipple areola complex was brought out with the skin de-epithelialized at the location to make place for the complex.  The area was secured with 4-0 Vicryl at the deep layers followed by 5-0 Monocryl.    Left: Preoperative markings were confirmed.  Incision lines were injected with 1% Xylocaine with epinephrine.  After waiting for vasoconstriction, the marked lines were incised.  An inferior pedical breast reduction was performed by de-epithelializing the pedicle, using bovie to create the lateral and medial pedicles, and removing breast tissue from the superior, lateral, and medial portions of the breast.  Liposuction was done laterally to help with the contour.  Care was taken to not undermine the breast pedicle. Hemostasis was achieved.  The nipple was gently rotated into position and the skin was temporarily closed with staples.  The patient was sat upright and size and shape symmetry was confirmed.  The pocket was irrigated, a drain, and hemostasis confirmed.  The deep tissues were approximated with 3-0 Vicryl sutures and the skin was closed with deep dermal and subcuticular 4-0 Monocryl sutures followed by 5-0 Monocryl.  The nipple areola complex was brought out with the skin de-epithelialized at the location to make place for the complex.  The area  was secured with 4-0 Vicryl at the deep layers followed by 5-0 Monocryl.  The nipple and skin flaps had good capillary refill at the end of  the procedure. The patient tolerated the procedure well. The patient was allowed to wake from anesthesia and taken to the recovery room in satisfactory condition.

## 2016-08-30 ENCOUNTER — Encounter (HOSPITAL_BASED_OUTPATIENT_CLINIC_OR_DEPARTMENT_OTHER): Payer: Self-pay | Admitting: Plastic Surgery

## 2016-08-30 DIAGNOSIS — N62 Hypertrophy of breast: Secondary | ICD-10-CM | POA: Diagnosis not present

## 2016-08-30 NOTE — Discharge Summary (Signed)
Physician Discharge Summary  Patient ID: Crystal LollDiane Anger MRN: [id number] DOB/AGE: 69/06/1946 69 y.o.  Admit date: 08/29/2016 Discharge date: 08/30/2016  Admission Diagnoses:  Discharge Diagnoses:  Active Problems:   Symptomatic mammary hypertrophy   Discharged Condition: good  Hospital Course: Patient was taken to the OR and underwent a bilateral mammogram.  She was monitored over night and did well.  Consults: None  Significant Diagnostic Studies: none  Treatments: surgery  Discharge Exam: Blood pressure 133/60, pulse 84, temperature 97.1 F (36.2 C), resp. rate 18, height 5\' 5"  (1.651 m), weight 102.5 kg (226 lb), SpO2 95 %doing welldoing well  Disposition: 01-Home or Self Care     Medication List    TAKE these medications   apixaban 5 MG Tabs tablet Commonly known as:  ELIQUIS Take 1 tablet (5 mg total) by mouth 2 (two) times daily.   ARTIFICIAL TEARS 0.1-0.3 % Soln Place 1 drop into both eyes daily.   CENTRUM PO Take 1 tablet by mouth daily. AM   cholecalciferol 1000 units tablet Commonly known as:  VITAMIN D Take 2,000 Units by mouth daily.   diphenhydramine-acetaminophen 25-500 MG Tabs tablet Commonly known as:  TYLENOL PM Take 1 tablet by mouth at bedtime as needed (AS NEEDED FOR SLEEP).   EPIPEN 2-PAK 0.3 mg/0.3 mL Soaj injection Generic drug:  EPINEPHrine Inject as directed as directed. AS NEEDED FOR ANAPHYLAXIS   eszopiclone 2 MG Tabs tablet Commonly known as:  LUNESTA TAKE 1 TABLET BY MOUTH IMMEDIATELY BEFORE BEDTIME   fexofenadine 180 MG tablet Commonly known as:  ALLEGRA Take 180 mg by mouth daily. PM   fluticasone 50 MCG/ACT nasal spray Commonly known as:  FLONASE Place 2 sprays into both nostrils daily.   PARoxetine 20 MG tablet Commonly known as:  PAXIL TAKE 1 TABLET BY MOUTH EVERY DAY   PATADAY 0.2 % Soln Generic drug:  Olopatadine HCl Place 1 drop into both eyes daily.      Follow-up Information    Ari Engelbrecht S Warwick Nick, DO In  1 week.   Specialty:  Plastic Surgery Contact information: [address] [place] [address] [phone]           Signed: Peggye FormCLAIRE S Sailor Haughn 08/30/2016, 8:37 AM

## 2016-09-02 ENCOUNTER — Telehealth: Payer: Self-pay | Admitting: Family Medicine

## 2016-09-02 ENCOUNTER — Encounter: Payer: Self-pay | Admitting: Family Medicine

## 2016-09-02 ENCOUNTER — Ambulatory Visit (INDEPENDENT_AMBULATORY_CARE_PROVIDER_SITE_OTHER): Payer: PPO | Admitting: Family Medicine

## 2016-09-02 VITALS — BP 118/84 | HR 72 | Temp 98.0°F | Ht 64.0 in | Wt 224.4 lb

## 2016-09-02 DIAGNOSIS — Z1159 Encounter for screening for other viral diseases: Secondary | ICD-10-CM

## 2016-09-02 DIAGNOSIS — Z13820 Encounter for screening for osteoporosis: Secondary | ICD-10-CM

## 2016-09-02 DIAGNOSIS — E669 Obesity, unspecified: Secondary | ICD-10-CM

## 2016-09-02 DIAGNOSIS — Z1329 Encounter for screening for other suspected endocrine disorder: Secondary | ICD-10-CM | POA: Diagnosis not present

## 2016-09-02 DIAGNOSIS — Z13220 Encounter for screening for lipoid disorders: Secondary | ICD-10-CM

## 2016-09-02 DIAGNOSIS — Z0001 Encounter for general adult medical examination with abnormal findings: Secondary | ICD-10-CM

## 2016-09-02 DIAGNOSIS — R238 Other skin changes: Secondary | ICD-10-CM

## 2016-09-02 DIAGNOSIS — R0989 Other specified symptoms and signs involving the circulatory and respiratory systems: Secondary | ICD-10-CM

## 2016-09-02 DIAGNOSIS — R946 Abnormal results of thyroid function studies: Secondary | ICD-10-CM | POA: Diagnosis not present

## 2016-09-02 DIAGNOSIS — J309 Allergic rhinitis, unspecified: Secondary | ICD-10-CM | POA: Insufficient documentation

## 2016-09-02 DIAGNOSIS — L989 Disorder of the skin and subcutaneous tissue, unspecified: Secondary | ICD-10-CM | POA: Insufficient documentation

## 2016-09-02 DIAGNOSIS — N62 Hypertrophy of breast: Secondary | ICD-10-CM

## 2016-09-02 LAB — COMPREHENSIVE METABOLIC PANEL
ALBUMIN: 3.8 g/dL (ref 3.5–5.2)
ALK PHOS: 51 U/L (ref 39–117)
ALT: 12 U/L (ref 0–35)
AST: 16 U/L (ref 0–37)
BUN: 18 mg/dL (ref 6–23)
CALCIUM: 9.6 mg/dL (ref 8.4–10.5)
CHLORIDE: 102 meq/L (ref 96–112)
CO2: 32 mEq/L (ref 19–32)
CREATININE: 0.82 mg/dL (ref 0.40–1.20)
GFR: 73.45 mL/min (ref >60.00)
Glucose, Bld: 96 mg/dL (ref 70–99)
Potassium: 4.9 mEq/L (ref 3.5–5.1)
SODIUM: 141 meq/L (ref 135–145)
TOTAL PROTEIN: 7 g/dL (ref 6.0–8.3)
Total Bilirubin: 0.5 mg/dL (ref 0.2–1.2)

## 2016-09-02 LAB — LIPID PANEL
CHOLESTEROL: 181 mg/dL (ref 0–200)
HDL: 59.9 mg/dL (ref >39.00)
LDL CALC: 102 mg/dL — AB (ref 0–99)
NonHDL: 121.19
TRIGLYCERIDES: 98 mg/dL (ref 0.0–149.0)
Total CHOL/HDL Ratio: 3
VLDL: 19.6 mg/dL (ref 0.0–40.0)

## 2016-09-02 LAB — TSH: TSH: 2.17 u[IU]/mL (ref 0.35–4.50)

## 2016-09-02 LAB — HEMOGLOBIN A1C: HEMOGLOBIN A1C: 6 % (ref 4.6–6.5)

## 2016-09-02 MED ORDER — ESZOPICLONE 3 MG PO TABS: ORAL_TABLET | ORAL | 1 refills | Status: DC

## 2016-09-02 NOTE — Assessment & Plan Note (Signed)
Suspect lesion on her nose is related to her CPAP mask and having to change the tightness of this. No signs of infection. Advised to loosen up her CPAP mask as able to. If continues to have issues with this we may have to have her mask reevaluated. Given return precautions.

## 2016-09-02 NOTE — Assessment & Plan Note (Signed)
Suspect phlegm in her throat is related to allergic rhinitis. She will continue allergy shots, Allegra, and Flonase. She'll add a humidifier. She can try plain Mucinex as well. If not improving she'll let us know.

## 2016-09-02 NOTE — Assessment & Plan Note (Signed)
Patient reports no recurrence of the change in her skin color in her left lower extremity. She does have decreased left DP pulse though her foot is warm. We'll obtain ABIs to evaluate this further. She is given return precautions.

## 2016-09-02 NOTE — Telephone Encounter (Signed)
Printed.  Please fax.

## 2016-09-02 NOTE — Patient Instructions (Addendum)
Nice to see you. Please keep your appointment with the surgeon this Friday. Please try a humidifier and plain Mucinex for your phlegm buildup. Please continue your allergy treatment. Please monitor the area on your nose and if this changes please let us know. We will get lab work and call you with the results. If you develop any pain, drainage, redness, or any new or changing symptoms please seek medical attention.

## 2016-09-02 NOTE — Telephone Encounter (Signed)
RX faxed and patient notified. 

## 2016-09-02 NOTE — Telephone Encounter (Signed)
Pt is requesting that her eszopiclone (LUNESTA) 2 MG TABS tablet, but she wants 3MG . 3MG  works for pt, 2MG  does not work for her.

## 2016-09-02 NOTE — Assessment & Plan Note (Signed)
Status post breast reduction surgery. Appears to be well healing at this time. She'll keep her appointment with the surgeon this week. She is given return precautions.

## 2016-09-02 NOTE — Assessment & Plan Note (Signed)
Patient is obese. She will continue to work on diet. She is unable to exercise at this time given her breast reduction surgery. Mammogram is up-to-date. No prior history of abnormal Pap smear and given this and her age Pap smear is not indicated. Reports colonoscopy in the last 5 years. She will find out who did this and let us know so we can request records. Lab work as outlined below. DEXA scan will be ordered. She will check on tetanus vaccination.

## 2016-09-02 NOTE — Progress Notes (Signed)
Tommi Rumps, MD Phone: [phone]  Crystal Haas is a 70 y.o. female who presents today for physical exam.  Diet is described as good. Eats lots of fruits and vegetables. Lots of protein. Only drinks water. She does eat out once a day. Not exercising currently given she just had breast reduction surgery. Mammogram up-to-date this year. Last Pap smear was 3 or so years ago. She reports no prior abnormal Pap smears. Reports having had a colonoscopy within the last 5 years. No hepatitis C testing. She is unsure on her last tetanus vaccination. Up-to-date on pneumonia vaccinations and Zostavax. Had her flu shot last time she was here.  His due for DEXA scan. Former smoker. Social drinker. No illicit drug use. Sees a dentist twice a year. Sees ophthalmology frequently. She is going to see them soon for cataracts.  Patient notes just having a breast reduction surgery last week. Notes one of the drains does leak. They contacted the surgeon about this and they're seeing them on Friday. She notes some bruising though no erythema or pain. No purulent drainage.  Patient reports she was seen in our office about a month ago after her left leg turned blue. She notes she was reassured and advised to monitor. Has not recurred. She does currently take Eliquis. No pain in her legs.  Patient has a history of allergic rhinitis. She wakes up in the morning with phlegm in the back of her throat. She has difficulty coughing it up. No rhinorrhea. She does allergy shots and uses Allegra and Flonase.  Patient additionally notes since having her breast reduction surgery she has had to sleep on her back and has had to attach her CPAP mask more tightly than usual. This has created a red area over the bridge of her nose that is mildly tender. There is no drainage from this. There is no spreading redness. She notes in the past she only sleeps on her stomach so that the CPAP mask fits better and she doesn't have to  have it is tight.  Active Ambulatory Problems    Diagnosis Date Noted  . Chronic insomnia 05/30/2015  . Depression 06/21/2015  . Sleep apnea 08/30/2015  . Encounter for general adult medical examination with abnormal findings 09/04/2015  . Chronic atrial fibrillation ([place]) 09/11/2015  . Hyperkalemia 09/12/2015  . Right knee pain 02/15/2016  . Injury of right rotator cuff 02/15/2016  . Excessive crying 05/16/2016  . Change of skin color 08/06/2016  . Symptomatic mammary hypertrophy 08/29/2016  . Allergic rhinitis 09/02/2016  . Skin lesion 09/02/2016   Resolved Ambulatory Problems    Diagnosis Date Noted  . Right foot injury 12/28/2015   Past Medical History:  Diagnosis Date  . Anemia   . Anxiety   . Arthritis   . Atrial fibrillation ([place])   . Depression   . Dysrhythmia   . Heart murmur   . Knee pain   . Motion sickness   . New onset a-fib ([place])   . Seasonal allergies   . Sleep apnea     Family History  Problem Relation Age of Onset  . Alcoholism    . Heart disease    . Breast cancer Neg Hx     Social History   Social History  . Marital status: Widowed    Spouse name: N/A  . Number of children: N/A  . Years of education: N/A   Occupational History  . Not on file.   Social History Main Topics  .  Smoking status: Former Smoker    Packs/day: 0.25    Types: Cigarettes    Quit date: 10/21/1969  . Smokeless tobacco: Never Used  . Alcohol use 4.2 oz/week    7 Shots of liquor per week     Comment: daily  . Drug use: No  . Sexual activity: Not on file   Other Topics Concern  . Not on file   Social History Narrative  . No narrative on file    ROS  General:  Negative for nexplained weight loss, fever Skin: Negative for new or changing mole, sore that won't heal HEENT: Positive for trouble seeing, Negative for trouble hearing, ringing in ears, mouth sores, hoarseness, change in voice, dysphagia. CV:  Negative for chest pain, dyspnea, edema,  palpitations Resp: Negative for cough, dyspnea, hemoptysis GI: Negative for nausea, vomiting, diarrhea, constipation, abdominal pain, melena, hematochezia. GU: Negative for dysuria, incontinence, urinary hesitance, hematuria, vaginal or penile discharge, polyuria, sexual difficulty, lumps in testicle or breasts MSK: Negative for muscle cramps or aches, joint pain or swelling Neuro: Negative for headaches, weakness, numbness, dizziness, passing out/fainting Psych: Negative for depression, anxiety, memory problems  Objective  Physical Exam Vitals:   09/02/16 1001  BP: 118/84  Pulse: 72  Temp: 98 F (36.7 C)    BP Readings from Last 3 Encounters:  09/02/16 118/84  08/29/16 133/60  08/06/16 (!) 156/94   Wt Readings from Last 3 Encounters:  09/02/16 224 lb 6 oz (101.8 kg)  08/29/16 226 lb (102.5 kg)  08/06/16 235 lb 6 oz (106.8 kg)    Physical Exam  Constitutional: She is well-developed, well-nourished, and in no distress.  HENT:  Head: Normocephalic and atraumatic.    Mouth/Throat: Oropharynx is clear and moist. No oropharyngeal exudate.  Eyes: Conjunctivae are normal. Pupils are equal, round, and reactive to light.  Cardiovascular: Normal rate, regular rhythm and normal heart sounds.   Pulmonary/Chest: Effort normal and breath sounds normal.  Abdominal: Soft. Bowel sounds are normal. She exhibits no distension. There is no tenderness. There is no rebound and no guarding.  Genitourinary:  Genitourinary Comments: Bilateral breasts with well-healing scars with surrounding bruising, no erythema from the scars, there are nitroglycerin patches over the nipples, there are drains on her bilateral flanks with serous sanguinous material in them, left drain with mild leakage from around it, no fluctuance or palpable lesions in this area  Musculoskeletal: She exhibits no edema.  2+ DP and PT pulse in the right foot, decreased DP pulses in the left foot, 2+ PT pulse in the left foot,  bilateral feet are warm and are of normal color  Neurological: She is alert. Gait normal.  Skin: Skin is warm and dry.  Psychiatric: Mood and affect normal.     Assessment/Plan:   Encounter for general adult medical examination with abnormal findings Patient is obese. She will continue to work on diet. She is unable to exercise at this time given her breast reduction surgery. Mammogram is up-to-date. No prior history of abnormal Pap smear and given this and her age Pap smear is not indicated. Reports colonoscopy in the last 5 years. She will find out who did this and let us know so we can request records. Lab work as outlined below. DEXA scan will be ordered. She will check on tetanus vaccination.  Symptomatic mammary hypertrophy Status post breast reduction surgery. Appears to be well healing at this time. She'll keep her appointment with the surgeon this week. She is given return precautions.  Allergic rhinitis Suspect phlegm in her throat is related to allergic rhinitis. She will continue allergy shots, Allegra, and Flonase. She'll add a humidifier. She can try plain Mucinex as well. If not improving she'll let us know.  Skin lesion Suspect lesion on her nose is related to her CPAP mask and having to change the tightness of this. No signs of infection. Advised to loosen up her CPAP mask as able to. If continues to have issues with this we may have to have her mask reevaluated. Given return precautions.  Change of skin color Patient reports no recurrence of the change in her skin color in her left lower extremity. She does have decreased left DP pulse though her foot is warm. We'll obtain ABIs to evaluate this further. She is given return precautions.   Orders Placed This Encounter  Procedures  . DG Bone Density    Standing Status:   Future    Standing Expiration Date:   11/02/2017    Order Specific Question:   Reason for Exam (SYMPTOM  OR DIAGNOSIS REQUIRED)    Answer:   osteoporosis  screen    Order Specific Question:   Preferred imaging location?    Answer:   [facility]  . Lipid Profile  . Comp Met (CMET)  . HgB A1c  . TSH  . Hepatitis C Antibody    Tommi Rumps, MD [place]

## 2016-09-02 NOTE — Telephone Encounter (Signed)
Patient requesting refill of her Lunesta. Patient was seen today.

## 2016-09-03 LAB — HEPATITIS C ANTIBODY: HCV Ab: NEGATIVE

## 2016-09-06 ENCOUNTER — Encounter (HOSPITAL_BASED_OUTPATIENT_CLINIC_OR_DEPARTMENT_OTHER): Payer: Self-pay | Admitting: Plastic Surgery

## 2016-09-06 DIAGNOSIS — Z9889 Other specified postprocedural states: Secondary | ICD-10-CM | POA: Insufficient documentation

## 2016-09-06 HISTORY — DX: Other specified postprocedural states: Z98.890

## 2016-09-09 ENCOUNTER — Other Ambulatory Visit: Payer: Self-pay | Admitting: Family Medicine

## 2016-09-09 DIAGNOSIS — J301 Allergic rhinitis due to pollen: Secondary | ICD-10-CM | POA: Diagnosis not present

## 2016-09-09 MED ORDER — ATORVASTATIN CALCIUM 20 MG PO TABS: 20.0000 mg | ORAL_TABLET | Freq: Every day | ORAL | 3 refills | Status: DC

## 2016-09-10 DIAGNOSIS — H40003 Preglaucoma, unspecified, bilateral: Secondary | ICD-10-CM | POA: Diagnosis not present

## 2016-09-17 DIAGNOSIS — H2511 Age-related nuclear cataract, right eye: Secondary | ICD-10-CM | POA: Diagnosis not present

## 2016-09-20 HISTORY — PX: REDUCTION MAMMAPLASTY: SUR839

## 2016-09-23 DIAGNOSIS — J301 Allergic rhinitis due to pollen: Secondary | ICD-10-CM | POA: Diagnosis not present

## 2016-09-25 ENCOUNTER — Other Ambulatory Visit: Payer: Self-pay | Admitting: Family Medicine

## 2016-09-25 DIAGNOSIS — E785 Hyperlipidemia, unspecified: Secondary | ICD-10-CM

## 2016-09-30 ENCOUNTER — Other Ambulatory Visit (INDEPENDENT_AMBULATORY_CARE_PROVIDER_SITE_OTHER): Payer: PPO

## 2016-09-30 DIAGNOSIS — J301 Allergic rhinitis due to pollen: Secondary | ICD-10-CM | POA: Diagnosis not present

## 2016-09-30 DIAGNOSIS — E785 Hyperlipidemia, unspecified: Secondary | ICD-10-CM

## 2016-09-30 LAB — COMPREHENSIVE METABOLIC PANEL
ALBUMIN: 4 g/dL (ref 3.5–5.2)
ALK PHOS: 45 U/L (ref 39–117)
ALT: 11 U/L (ref 0–35)
AST: 11 U/L (ref 0–37)
BUN: 13 mg/dL (ref 6–23)
CO2: 30 mEq/L (ref 19–32)
Calcium: 8.9 mg/dL (ref 8.4–10.5)
Chloride: 102 mEq/L (ref 96–112)
Creatinine, Ser: 0.88 mg/dL (ref 0.40–1.20)
GFR: 67.68 mL/min (ref >60.00)
Glucose, Bld: 95 mg/dL (ref 70–99)
POTASSIUM: 4.4 meq/L (ref 3.5–5.1)
Sodium: 140 mEq/L (ref 135–145)
TOTAL PROTEIN: 6.6 g/dL (ref 6.0–8.3)
Total Bilirubin: 0.5 mg/dL (ref 0.2–1.2)

## 2016-09-30 LAB — LDL CHOLESTEROL, DIRECT: LDL DIRECT: 60 mg/dL

## 2016-10-01 DIAGNOSIS — H2511 Age-related nuclear cataract, right eye: Secondary | ICD-10-CM | POA: Diagnosis not present

## 2016-10-02 ENCOUNTER — Encounter: Payer: Self-pay | Admitting: Family Medicine

## 2016-10-03 ENCOUNTER — Encounter: Payer: Self-pay | Admitting: *Deleted

## 2016-10-04 NOTE — Discharge Instructions (Signed)
Cataract Surgery, Care After °Refer to this sheet in the next few weeks. These instructions provide you with information about caring for yourself after your procedure. Your health care provider may also give you more specific instructions. Your treatment has been planned according to current medical practices, but problems sometimes occur. Call your health care provider if you have any problems or questions after your procedure. °What can I expect after the procedure? °After the procedure, it is common to have: °· Itching. °· Discomfort. °· Fluid discharge. °· Sensitivity to light and to touch. °· Bruising. °Follow these instructions at home: °Eye Care  °· Check your eye every day for signs of infection. Watch for: °¨ Redness, swelling, or pain. °¨ Fluid, blood, or pus. °¨ Warmth. °¨ Bad smell. °Activity  °· Avoid strenuous activities, such as playing contact sports, for as long as told by your health care provider. °· Do not drive or operate heavy machinery until your health care provider approves. °· Do not bend or lift heavy objects . Bending increases pressure in the eye. You can walk, climb stairs, and do light household chores. °· Ask your health care provider when you can return to work. If you work in a dusty environment, you may be advised to wear protective eyewear for a period of time. °General instructions  °· Take or apply over-the-counter and prescription medicines only as told by your health care provider. This includes eye drops. °· Do not touch or rub your eyes. °· If you were given a protective shield, wear it as told by your health care provider. If you were not given a protective shield, wear sunglasses as told by your health care provider to protect your eyes. °· Keep the area around your eye clean and dry. Avoid swimming or allowing water to hit you directly in the face while showering until told by your health care provider. Keep soap and shampoo out of your eyes. °· Do not put a contact lens  into the affected eye or eyes until your health care provider approves. °· Keep all follow-up visits as told by your health care provider. This is important. °Contact a health care provider if: ° °· You have increased bruising around your eye. °· You have pain that is not helped with medicine. °· You have a fever. °· You have redness, swelling, or pain in your eye. °· You have fluid, blood, or pus coming from your incision. °· Your vision gets worse. °Get help right away if: °· You have sudden vision loss. °This information is not intended to replace advice given to you by your health care provider. Make sure you discuss any questions you have with your health care provider. °Document Released: 04/26/2005 Document Revised: 02/15/2016 Document Reviewed: 08/17/2015 °Elsevier Interactive Patient Education © 2017 [employer] ° ° ° ° °General Anesthesia, Adult, Care After °These instructions provide you with information about caring for yourself after your procedure. Your health care provider may also give you more specific instructions. Your treatment has been planned according to current medical practices, but problems sometimes occur. Call your health care provider if you have any problems or questions after your procedure. °What can I expect after the procedure? °After the procedure, it is common to have: °· Vomiting. °· A sore throat. °· Mental slowness. °It is common to feel: °· Nauseous. °· Cold or shivery. °· Sleepy. °· Tired. °· Sore or achy, even in parts of your body where you did not have surgery. °Follow these instructions at   home: °For at least 24 hours after the procedure:  °· Do not: °¨ Participate in activities where you could fall or become injured. °¨ Drive. °¨ Use heavy machinery. °¨ Drink alcohol. °¨ Take sleeping pills or medicines that cause drowsiness. °¨ Make important decisions or sign legal documents. °¨ Take care of children on your own. °· Rest. °Eating and drinking  °· If you vomit, drink  water, juice, or soup when you can drink without vomiting. °· Drink enough fluid to keep your urine clear or pale yellow. °· Make sure you have little or no nausea before eating solid foods. °· Follow the diet recommended by your health care provider. °General instructions  °· Have a responsible adult stay with you until you are awake and alert. °· Return to your normal activities as told by your health care provider. Ask your health care provider what activities are safe for you. °· Take over-the-counter and prescription medicines only as told by your health care provider. °· If you smoke, do not smoke without supervision. °· Keep all follow-up visits as told by your health care provider. This is important. °Contact a health care provider if: °· You continue to have nausea or vomiting at home, and medicines are not helpful. °· You cannot drink fluids or start eating again. °· You cannot urinate after 8-12 hours. °· You develop a skin rash. °· You have fever. °· You have increasing redness at the site of your procedure. °Get help right away if: °· You have difficulty breathing. °· You have chest pain. °· You have unexpected bleeding. °· You feel that you are having a life-threatening or urgent problem. °This information is not intended to replace advice given to you by your health care provider. Make sure you discuss any questions you have with your health care provider. °Document Released: 01/13/2001 Document Revised: 03/11/2016 Document Reviewed: 09/21/2015 °Elsevier Interactive Patient Education © 2017 [employer] Inc. ° °

## 2016-10-07 DIAGNOSIS — J301 Allergic rhinitis due to pollen: Secondary | ICD-10-CM | POA: Diagnosis not present

## 2016-10-09 ENCOUNTER — Ambulatory Visit
Admission: RE | Admit: 2016-10-09 | Discharge: 2016-10-09 | Disposition: A | Discharge status: Home / Self Care | Payer: PPO | Source: Ambulatory Visit | Attending: Ophthalmology | Admitting: Ophthalmology

## 2016-10-09 ENCOUNTER — Ambulatory Visit: Payer: PPO | Admitting: Anesthesiology

## 2016-10-09 ENCOUNTER — Encounter
Admission: RE | Disposition: A | Discharge status: Home / Self Care | Payer: Self-pay | Source: Ambulatory Visit | Attending: Ophthalmology

## 2016-10-09 DIAGNOSIS — I4891 Unspecified atrial fibrillation: Secondary | ICD-10-CM | POA: Insufficient documentation

## 2016-10-09 DIAGNOSIS — Z87891 Personal history of nicotine dependence: Secondary | ICD-10-CM | POA: Insufficient documentation

## 2016-10-09 DIAGNOSIS — H2511 Age-related nuclear cataract, right eye: Secondary | ICD-10-CM | POA: Insufficient documentation

## 2016-10-09 DIAGNOSIS — G4730 Sleep apnea, unspecified: Secondary | ICD-10-CM | POA: Diagnosis not present

## 2016-10-09 HISTORY — PX: CATARACT EXTRACTION W/PHACO: SHX586

## 2016-10-09 SURGERY — PHACOEMULSIFICATION, CATARACT, WITH IOL INSERTION
Anesthesia: Monitor Anesthesia Care | Site: Eye | Laterality: Right | Wound class: Clean

## 2016-10-09 MED ORDER — BSS IO SOLN
INTRAOCULAR | Status: DC | PRN
  Administered 2016-10-09: 55 mL via OPHTHALMIC

## 2016-10-09 MED ORDER — LACTATED RINGERS IV SOLN: INTRAVENOUS | Status: DC

## 2016-10-09 MED ORDER — BRIMONIDINE TARTRATE 0.2 % OP SOLN
OPHTHALMIC | Status: DC | PRN
  Administered 2016-10-09: 1 [drp] via OPHTHALMIC

## 2016-10-09 MED ORDER — TIMOLOL MALEATE 0.5 % OP SOLN
OPHTHALMIC | Status: DC | PRN
  Administered 2016-10-09: 1 [drp] via OPHTHALMIC

## 2016-10-09 MED ORDER — NA HYALUR & NA CHOND-NA HYALUR 0.4-0.35 ML IO KIT
PACK | INTRAOCULAR | Status: DC | PRN
  Administered 2016-10-09: 1 mL via INTRAOCULAR

## 2016-10-09 MED ORDER — LIDOCAINE HCL (PF) 4 % IJ SOLN
INTRAOCULAR | Status: DC | PRN
  Administered 2016-10-09: 1.5 mL via OPHTHALMIC

## 2016-10-09 MED ORDER — ARMC OPHTHALMIC DILATING DROPS
1.0000 "application " | OPHTHALMIC | Status: DC | PRN
  Administered 2016-10-09 (×3): 1 via OPHTHALMIC

## 2016-10-09 MED ORDER — CEFUROXIME OPHTHALMIC INJECTION 1 MG/0.1 ML
INJECTION | OPHTHALMIC | Status: DC | PRN
  Administered 2016-10-09: .3 mL via INTRACAMERAL

## 2016-10-09 MED ORDER — MOXIFLOXACIN HCL 0.5 % OP SOLN
1.0000 [drp] | OPHTHALMIC | Status: DC | PRN
  Administered 2016-10-09 (×3): 1 [drp] via OPHTHALMIC

## 2016-10-09 MED ORDER — FENTANYL CITRATE (PF) 100 MCG/2ML IJ SOLN
INTRAMUSCULAR | Status: DC | PRN
  Administered 2016-10-09: 100 ug via INTRAVENOUS

## 2016-10-09 MED ORDER — MIDAZOLAM HCL 2 MG/2ML IJ SOLN
INTRAMUSCULAR | Status: DC | PRN
  Administered 2016-10-09: 2 mg via INTRAVENOUS

## 2016-10-09 SURGICAL SUPPLY — 25 items
CANNULA ANT/CHMB 27GA (MISCELLANEOUS) ×2 IMPLANT
CARTRIDGE ABBOTT (MISCELLANEOUS) IMPLANT
GLOVE SURG LX 7.5 STRW (GLOVE) ×1
GLOVE SURG LX STRL 7.5 STRW (GLOVE) ×1 IMPLANT
GLOVE SURG TRIUMPH 8.0 PF LTX (GLOVE) ×2 IMPLANT
GOWN STRL REUS W/ TWL LRG LVL3 (GOWN DISPOSABLE) ×2 IMPLANT
GOWN STRL REUS W/TWL LRG LVL3 (GOWN DISPOSABLE) ×2
LENS IOL TECNIS ITEC 22.5 (Intraocular Lens) ×2 IMPLANT
MARKER SKIN DUAL TIP RULER LAB (MISCELLANEOUS) ×2 IMPLANT
NDL RETROBULBAR .5 NSTRL (NEEDLE) IMPLANT
NEEDLE FILTER BLUNT 18X 1/2SAF (NEEDLE) ×1
NEEDLE FILTER BLUNT 18X1 1/2 (NEEDLE) ×1 IMPLANT
PACK CATARACT BRASINGTON (MISCELLANEOUS) ×2 IMPLANT
PACK EYE AFTER SURG (MISCELLANEOUS) ×2 IMPLANT
PACK OPTHALMIC (MISCELLANEOUS) ×2 IMPLANT
RING MALYGIN 7.0 (MISCELLANEOUS) IMPLANT
SUT ETHILON 10-0 CS-B-6CS-B-6 (SUTURE)
SUT VICRYL  9 0 (SUTURE)
SUT VICRYL 9 0 (SUTURE) IMPLANT
SUTURE EHLN 10-0 CS-B-6CS-B-6 (SUTURE) IMPLANT
SYR 3ML LL SCALE MARK (SYRINGE) ×2 IMPLANT
SYR 5ML LL (SYRINGE) ×2 IMPLANT
SYR TB 1ML LUER SLIP (SYRINGE) ×2 IMPLANT
WATER STERILE IRR 250ML POUR (IV SOLUTION) ×2 IMPLANT
WIPE NON LINTING 3.25X3.25 (MISCELLANEOUS) ×2 IMPLANT

## 2016-10-09 NOTE — Transfer of Care (Signed)
Immediate Anesthesia Transfer of Care Note  Patient: Crystal LollDiane Gane  Procedure(s) Performed: Procedure(s) with comments: CATARACT EXTRACTION PHACO AND INTRAOCULAR LENS PLACEMENT (IOC) (Right) - sleep apnea  Patient Location: PACU  Anesthesia Type: MAC  Level of Consciousness: awake, alert  and patient cooperative  Airway and Oxygen Therapy: Patient Spontanous Breathing and Patient connected to supplemental oxygen  Post-op Assessment: Post-op Vital signs reviewed, Patient's Cardiovascular Status Stable, Respiratory Function Stable, Patent Airway and No signs of Nausea or vomiting  Post-op Vital Signs: Reviewed and stable  Complications: No apparent anesthesia complications

## 2016-10-09 NOTE — H&P (Signed)
The History and Physical notes are on paper, have been signed, and are to be scanned. The patient remains stable and unchanged from the H&P.   Previous H&P reviewed, patient examined, and there are no changes.  Crystal Haas 10/09/2016 7:48 AM

## 2016-10-09 NOTE — Anesthesia Postprocedure Evaluation (Signed)
Anesthesia Post Note  Patient: Crystal Haas  Procedure(s) Performed: Procedure(s) (LRB): CATARACT EXTRACTION PHACO AND INTRAOCULAR LENS PLACEMENT (IOC) (Right)  Patient location during evaluation: PACU Anesthesia Type: MAC Level of consciousness: awake and alert and oriented Pain management: satisfactory to patient Vital Signs Assessment: post-procedure vital signs reviewed and stable Respiratory status: spontaneous breathing, nonlabored ventilation and respiratory function stable Cardiovascular status: blood pressure returned to baseline and stable Postop Assessment: Adequate PO intake and No signs of nausea or vomiting Anesthetic complications: no    [name], [name]

## 2016-10-09 NOTE — Anesthesia Preprocedure Evaluation (Addendum)
Anesthesia Evaluation  Patient identified by MRN, date of birth, ID band Patient awake    Reviewed: Allergy & Precautions, H&P , NPO status , Patient's Chart, lab work & pertinent test results  Airway Mallampati: II  TM Distance: >3 FB Neck ROM: full    Dental no notable dental hx.    Pulmonary sleep apnea , former smoker,    Pulmonary exam normal        Cardiovascular + dysrhythmias Atrial Fibrillation  Rhythm:irregular Rate:Normal     Neuro/Psych PSYCHIATRIC DISORDERS    GI/Hepatic   Endo/Other    Renal/GU      Musculoskeletal   Abdominal   Peds  Hematology   Anesthesia Other Findings   Reproductive/Obstetrics                            Anesthesia Physical Anesthesia Plan  ASA: III  Anesthesia Plan: MAC   Post-op Pain Management:    Induction:   Airway Management Planned:   Additional Equipment:   Intra-op Plan:   Post-operative Plan:   Informed Consent: I have reviewed the patients History and Physical, chart, labs and discussed the procedure including the risks, benefits and alternatives for the proposed anesthesia with the patient or authorized representative who has indicated his/her understanding and acceptance.     Plan Discussed with:   Anesthesia Plan Comments:         Anesthesia Quick Evaluation

## 2016-10-09 NOTE — Op Note (Signed)
LOCATION:  [facility]   PREOPERATIVE DIAGNOSIS:    Nuclear sclerotic cataract right eye. H25.11   POSTOPERATIVE DIAGNOSIS:  Nuclear sclerotic cataract right eye.     PROCEDURE:  Phacoemusification with posterior chamber intraocular lens placement of the right eye   LENS:   Implant Name Type Inv. Item Serial No. Manufacturer Lot No. LRB No. Used  LENS IOL DIOP 22.5 - [id number] Intraocular Lens LENS IOL DIOP 22.5 [id number] AMO   Right 1        ULTRASOUND TIME: 14 % of 0 minutes, 56 seconds.  CDE 7.7   SURGEON:  Deirdre Evenerhadwick R. Alvenia Treese, MD   ANESTHESIA:  Topical with tetracaine drops and 2% Xylocaine jelly, augmented with 1% preservative-free intracameral lidocaine.    COMPLICATIONS:  None.   DESCRIPTION OF PROCEDURE:  The patient was identified in the holding room and transported to the operating room and placed in the supine position under the operating microscope.  The right eye was identified as the operative eye and it was prepped and draped in the usual sterile ophthalmic fashion.   A 1 millimeter clear-corneal paracentesis was made at the 12:00 position.  0.5 ml of preservative-free 1% lidocaine was injected into the anterior chamber. The anterior chamber was filled with Viscoat viscoelastic.  A 2.4 millimeter keratome was used to make a near-clear corneal incision at the 9:00 position.  A curvilinear capsulorrhexis was made with a cystotome and capsulorrhexis forceps.  Balanced salt solution was used to hydrodissect and hydrodelineate the nucleus.   Phacoemulsification was then used in stop and chop fashion to remove the lens nucleus and epinucleus.  The remaining cortex was then removed using the irrigation and aspiration handpiece. Provisc was then placed into the capsular bag to distend it for lens placement.  A lens was then injected into the capsular bag.  The remaining viscoelastic was aspirated.   Wounds were hydrated with balanced salt solution.  The anterior  chamber was inflated to a physiologic pressure with balanced salt solution.  No wound leaks were noted. Cefuroxime 0.1 ml of a 10mg /ml solution was injected into the anterior chamber for a dose of 1 mg of intracameral antibiotic at the completion of the case.   Timolol and Brimonidine drops were applied to the eye.  The patient was taken to the recovery room in stable condition without complications of anesthesia or surgery.   Bellarose Burtt 10/09/2016, 9:14 AM

## 2016-10-09 NOTE — Anesthesia Procedure Notes (Signed)
Procedure Name: MAC Date/Time: 10/09/2016 8:58 AM Performed by: Maryan RuedWILSON, Mattheo Swindle M Pre-anesthesia Checklist: Patient identified, Emergency Drugs available, Suction available and Patient being monitored Patient Re-evaluated:Patient Re-evaluated prior to inductionOxygen Delivery Method: Nasal cannula

## 2016-10-10 ENCOUNTER — Encounter: Payer: Self-pay | Admitting: Ophthalmology

## 2016-10-25 DIAGNOSIS — G4733 Obstructive sleep apnea (adult) (pediatric): Secondary | ICD-10-CM | POA: Diagnosis not present

## 2016-10-28 DIAGNOSIS — J301 Allergic rhinitis due to pollen: Secondary | ICD-10-CM | POA: Diagnosis not present

## 2016-10-29 DIAGNOSIS — M1731 Unilateral post-traumatic osteoarthritis, right knee: Secondary | ICD-10-CM | POA: Diagnosis not present

## 2016-10-29 DIAGNOSIS — M25561 Pain in right knee: Secondary | ICD-10-CM | POA: Diagnosis not present

## 2016-11-11 DIAGNOSIS — J301 Allergic rhinitis due to pollen: Secondary | ICD-10-CM | POA: Diagnosis not present

## 2016-11-12 DIAGNOSIS — J301 Allergic rhinitis due to pollen: Secondary | ICD-10-CM | POA: Diagnosis not present

## 2016-11-13 ENCOUNTER — Other Ambulatory Visit: Payer: Self-pay | Admitting: Family Medicine

## 2016-11-13 NOTE — Telephone Encounter (Signed)
Last OV 09/02/16 last filled 09/02/16 30 1rf

## 2016-11-14 NOTE — Telephone Encounter (Signed)
faxed

## 2016-11-18 DIAGNOSIS — J301 Allergic rhinitis due to pollen: Secondary | ICD-10-CM | POA: Diagnosis not present

## 2016-11-25 DIAGNOSIS — J301 Allergic rhinitis due to pollen: Secondary | ICD-10-CM | POA: Diagnosis not present

## 2016-12-03 DIAGNOSIS — J301 Allergic rhinitis due to pollen: Secondary | ICD-10-CM | POA: Diagnosis not present

## 2016-12-05 ENCOUNTER — Other Ambulatory Visit: Payer: Self-pay | Admitting: Family Medicine

## 2016-12-05 DIAGNOSIS — F329 Major depressive disorder, single episode, unspecified: Secondary | ICD-10-CM

## 2016-12-05 DIAGNOSIS — F32A Depression, unspecified: Secondary | ICD-10-CM

## 2016-12-09 DIAGNOSIS — J301 Allergic rhinitis due to pollen: Secondary | ICD-10-CM | POA: Diagnosis not present

## 2016-12-16 DIAGNOSIS — J301 Allergic rhinitis due to pollen: Secondary | ICD-10-CM | POA: Diagnosis not present

## 2017-01-01 ENCOUNTER — Ambulatory Visit (INDEPENDENT_AMBULATORY_CARE_PROVIDER_SITE_OTHER): Payer: Medicare HMO

## 2017-01-01 VITALS — BP 140/80 | HR 84 | Temp 98.0°F | Resp 14 | Ht 64.0 in | Wt 219.8 lb

## 2017-01-01 DIAGNOSIS — Z0000 Encounter for general adult medical examination without abnormal findings: Secondary | ICD-10-CM | POA: Diagnosis not present

## 2017-01-01 NOTE — Patient Instructions (Addendum)
  [name] , Thank you for taking time to come for your Medicare Wellness Visit. I appreciate your ongoing commitment to your health goals. Please review the following plan we discussed and let me know if I can assist you in the future.   Follow up with Dr. Birdie SonsSonnenberg as needed.    Bring a copy of your Health Care Power of Attorney and/or Living Will to be scanned into chart.  Have a great day!  These are the goals we discussed: Goals    . Increase physical activity       This is a list of the screening recommended for you and due dates:  Health Maintenance  Topic Date Due  . Tetanus Vaccine  07/29/1966  . DEXA scan (bone density measurement)  07/29/2012  . Pneumonia vaccines (1 of 2 - PCV13) 07/29/2012  . Mammogram  07/10/2018  . Colon Cancer Screening  12/18/2022  . Flu Shot  Completed  .  Hepatitis C: One time screening is recommended by Center for Disease Control  (CDC) for  adults born from [birth year] through 1964.   Completed

## 2017-01-01 NOTE — Progress Notes (Signed)
Subjective:   Crystal Haas is a 71 y.o. female who presents for an Initial Medicare Annual Wellness Visit.  Review of Systems    No ROS.  Medicare Wellness Visit.  Cardiac Risk Factors include: advanced age (>62men, >72 women);obesity (BMI >30kg/m2)     Objective:    Today's Vitals   01/01/17 1551  BP: 140/80  Pulse: 84  Resp: 14  Temp: 98 F (36.7 C)  TempSrc: Oral  SpO2: 95%  Weight: 219 lb 12.8 oz (99.7 kg)  Height: 5\' 4"  (1.626 m)   Body mass index is 37.73 kg/m.   Current Medications (verified) Outpatient Encounter Prescriptions as of 01/01/2017  Medication Sig  . apixaban (ELIQUIS) 5 MG TABS tablet Take 1 tablet (5 mg total) by mouth 2 (two) times daily.  . ARTIFICIAL TEARS 0.1-0.3 % SOLN Place 1 drop into both eyes daily.  Marland Kitchen atorvastatin (LIPITOR) 20 MG tablet Take 1 tablet (20 mg total) by mouth daily.  . cetirizine (ZYRTEC) 10 MG tablet Take 10 mg by mouth at bedtime.  . cholecalciferol (VITAMIN D) 1000 UNITS tablet Take 2,000 Units by mouth daily.  . diphenhydramine-acetaminophen (TYLENOL PM) 25-500 MG TABS Take 1 tablet by mouth at bedtime as needed (AS NEEDED FOR SLEEP).   Marland Kitchen EPIPEN 2-PAK 0.3 MG/0.3ML SOAJ injection Inject as directed as directed. AS NEEDED FOR ANAPHYLAXIS  . fluticasone (FLONASE) 50 MCG/ACT nasal spray Place 2 sprays into both nostrils daily.   . Melatonin 1 MG/4ML LIQD Take by mouth.  . MELATONIN PO Take 150 mg by mouth at bedtime.  . Multiple Vitamins-Minerals (CENTRUM PO) Take 1 tablet by mouth daily. AM  . PARoxetine (PAXIL) 20 MG tablet TAKE 1 TABLET BY MOUTH EVERY DAY  . PATADAY 0.2 % SOLN Place 1 drop into both eyes daily.  . Eszopiclone 3 MG TABS TAKE 1 TABLET BY MOUTH IMMEDIATELY BEFORE BEDTIME (Patient not taking: Reported on 01/01/2017)   No facility-administered encounter medications on file as of 01/01/2017.     Allergies (verified) Apple; Daucus carota; Ivp dye [iodinated diagnostic agents]; Other; Strawberry (diagnostic);  Strawberry extract; Iodine; and Tape   History: Past Medical History:  Diagnosis Date  . Anemia    distant past  . Anxiety   . Arthritis    "everywhere" - big toes worst  . Atrial fibrillation (HCC)   . Depression   . Dysrhythmia    a-fib  . Heart murmur    mild - followed by PCP  . Knee pain   . Motion sickness    back seat of car  . New onset a-fib (HCC)   . Seasonal allergies    takes allergy weekly  . Sleep apnea    uses CPAP SS - [facility] 2013   Past Surgical History:  Procedure Laterality Date  . BREAST REDUCTION SURGERY Bilateral 08/29/2016   Procedure: BILATERAL MAMMARY REDUCTION  (BREAST)WITH LIPOSUCTION;  Surgeon: Peggye Form, DO;  Location: [facility];  Service: Plastics;  Laterality: Bilateral;  . CATARACT EXTRACTION W/PHACO Left 05/03/2015   Procedure: CATARACT EXTRACTION PHACO AND INTRAOCULAR LENS PLACEMENT (IOC);  Surgeon: Lockie Mola, MD;  Location: [facility];  Service: Ophthalmology;  Laterality: Left;  CPAP  . CATARACT EXTRACTION W/PHACO Right 10/09/2016   Procedure: CATARACT EXTRACTION PHACO AND INTRAOCULAR LENS PLACEMENT (IOC);  Surgeon: Lockie Mola, MD;  Location: [facility];  Service: Ophthalmology;  Laterality: Right;  sleep apnea  . CHONDROPLASTY Right 04/29/2016   Procedure: CHONDROPLASTY;  Surgeon: Donato Heinz,  MD;  Location: [facility];  Service: Orthopedics;  Laterality: Right;  . EYE SURGERY    . KNEE ARTHROSCOPY WITH LATERAL MENISECTOMY  04/29/2016   Procedure: KNEE ARTHROSCOPY WITH LATERAL MENISECTOMY;  Surgeon: Donato HeinzJames P Hooten, MD;  Location: [facility];  Service: Orthopedics;;  . KNEE ARTHROSCOPY WITH MEDIAL MENISECTOMY  04/29/2016   Procedure: KNEE ARTHROSCOPY WITH MEDIAL MENISECTOMY;  Surgeon: Donato HeinzJames P Hooten, MD;  Location: [facility];  Service: Orthopedics;;  . RETINAL DETACHMENT SURGERY Left May 03, 2015   [facility]  . TUBAL LIGATION     Family History  Problem Relation Age of  Onset  . Alcoholism    . Heart disease    . Breast cancer Neg Hx    Social History   Occupational History  . Not on file.   Social History Main Topics  . Smoking status: Former Smoker    Packs/day: 0.25    Types: Cigarettes    Quit date: 10/21/1969  . Smokeless tobacco: Never Used  . Alcohol use 4.2 oz/week    7 Shots of liquor per week     Comment: daily  . Drug use: No  . Sexual activity: Not on file    Tobacco Counseling Counseling given: Not Answered   Activities of Daily Living In your present state of health, do you have any difficulty performing the following activities: 01/01/2017 10/09/2016  Hearing? N N  Vision? N N  Difficulty concentrating or making decisions? N N  Walking or climbing stairs? N N  Dressing or bathing? N N  Doing errands, shopping? N -  Preparing Food and eating ? N -  Using the Toilet? N -  In the past six months, have you accidently leaked urine? Y -  Do you have problems with loss of bowel control? Y -  Managing your Medications? N -  Managing your Finances? N -  Housekeeping or managing your Housekeeping? N -  Some recent data might be hidden    Immunizations and Health Maintenance Immunization History  Administered Date(s) Administered  . Influenza, High Dose Seasonal PF 08/30/2015, 08/06/2016  . Pneumococcal Conjugate-13 01/01/2013  . Pneumococcal Polysaccharide-23 01/01/2014  . Zoster Recombinat (Shingrix) 01/02/2016   Health Maintenance Due  Topic Date Due  . TETANUS/TDAP  07/29/1966  . DEXA SCAN  07/29/2012  . PNA vac Low Risk Adult (1 of 2 - PCV13) 07/29/2012    Patient Care Team: Glori LuisEric G Sonnenberg, MD as PCP - General (Family Medicine)  Indicate any recent Medical Services you may have received from other than [facility] providers in the past year (date may be approximate).     Assessment:   This is a routine wellness examination for Crystal Haas. The goal of the wellness visit is to assist the patient how to close the gaps in  care and create a preventative care plan for the patient.   Taking calcium VIT D as appropriate/Osteoporosis risk reviewed.  Medications reviewed; taking without issues or barriers.  Safety issues reviewed; smoke detectors in the home. No firearms in the home.  Wears seatbelts when driving or riding with others. Patient does wear sunscreen or protective clothing when in direct sunlight. No violence in the home.  Patient is alert, normal appearance, oriented to person/place/and time. Correctly identified the president of the [place], recall of 3/3 objects, and performing simple calculations.  Patient displays appropriate judgement and can read correct time from watch face.  No new identified risk were noted.  No failures at ADL's or  IADL's.   BMI- discussed the importance of a healthy diet, water intake and exercise. Educational material provided.   Diet: Breakfast: Advocado, [place] bacon Lunch: Salad with protein Dinner: Stir fry, green vegetable Daily fluid intake: 1 cups of caffeine, 6-8 cups of water  HTN- followed by PCP.  Dental- every six months. Dr. Leander Rams  Sleep patterns- Sleeps 3-6  hours at night.  Wakes feeling tired.   CPAP in use.  Not taking Eszopiclone 3mg  tab due to non-covered by insurance. Taking Melatonin 150mg  and Tylenol PM 1 tab, nightly.  Patient Concerns: None at this time. Follow up with PCP as needed.  Hearing/Vision screen Hearing Screening Comments: Patient is able to hear conversational tones without difficulty.  No issues reported.   Vision Screening Comments: Followed by [facility] Wears corrective lenses Last OV 10/2015 Cataract extraction, bilateral Visual acuity not assessed per patient preference since they have regular follow up with the ophthalmologist  Dietary issues and exercise activities discussed: Current Exercise Habits: Home exercise routine, Intensity: Mild  Goals    . Increase physical activity       Depression Screen PHQ 2/9 Scores 01/01/2017 09/04/2015 08/30/2015 05/30/2015 05/03/2015  PHQ - 2 Score 0 0 0 0 0    Fall Risk Fall Risk  01/01/2017 09/04/2015 08/30/2015 05/30/2015 05/03/2015  Falls in the past year? Yes No No No No  Number falls in past yr: 1 - - - -  Follow up Falls prevention discussed - - - -    Cognitive Function: MMSE - Mini Mental State Exam 01/01/2017  Orientation to time 5  Orientation to Place 5  Registration 3  Attention/ Calculation 5  Recall 3  Language- name 2 objects 2  Language- repeat 1  Language- follow 3 step command 3  Language- read & follow direction 1  Write a sentence 1  Copy design 1  Total score 30        Screening Tests Health Maintenance  Topic Date Due  . TETANUS/TDAP  07/29/1966  . DEXA SCAN  07/29/2012  . PNA vac Low Risk Adult (1 of 2 - PCV13) 07/29/2012  . MAMMOGRAM  07/10/2018  . COLONOSCOPY  12/18/2022  . INFLUENZA VACCINE  Completed  . Hepatitis C Screening  Completed      Plan:   End of life planning; Advanced aging; Advanced directives discussed.  No HCPOA/Living Will.  Additional information provided to help them start the conversation with family.  Copy of HCPOA/Living Will requested upon completion. Time spent on this topic is 20 minutes.  Medicare Attestation I have personally reviewed: The patient's medical and social history Their use of alcohol, tobacco or illicit drugs Their current medications and supplements The patient's functional ability including ADLs,fall risks, home safety risks, cognitive, and hearing and visual impairment Diet and physical activities Evidence for depression -taking Paxil as directed  The patient's weight, height, BMI, and visual acuity have been recorded in the chart.  I have made referrals and provided education to the patient based on review of the above and I have provided the patient with a written personalized care plan for preventive services.    During the course of the  visit, Shae was educated and counseled about the following appropriate screening and preventive services:   Vaccines to include Pneumoccal, Influenza, Hepatitis B, Td, Zostavax, HCV  Colorectal cancer screening-UTD  Bone density screening-scheduled  Glaucoma screening-annual eye exam  Mammography-UTD  Nutrition counseling   Patient Instructions (the written plan) were  given to the patient.    Ashok Pall, LPN   1/61/0960

## 2017-01-05 NOTE — Progress Notes (Signed)
I have reviewed the above note and agree. Please offer the patient follow-up for her sleep issues sooner than her scheduled visit in May.  Marikay AlarEric Kathlyn Leachman, M.D.

## 2017-01-06 DIAGNOSIS — J301 Allergic rhinitis due to pollen: Secondary | ICD-10-CM | POA: Diagnosis not present

## 2017-01-13 DIAGNOSIS — J301 Allergic rhinitis due to pollen: Secondary | ICD-10-CM | POA: Diagnosis not present

## 2017-01-21 DIAGNOSIS — J301 Allergic rhinitis due to pollen: Secondary | ICD-10-CM | POA: Diagnosis not present

## 2017-01-27 DIAGNOSIS — G4733 Obstructive sleep apnea (adult) (pediatric): Secondary | ICD-10-CM | POA: Diagnosis not present

## 2017-01-27 DIAGNOSIS — J301 Allergic rhinitis due to pollen: Secondary | ICD-10-CM | POA: Diagnosis not present

## 2017-02-03 DIAGNOSIS — J301 Allergic rhinitis due to pollen: Secondary | ICD-10-CM | POA: Diagnosis not present

## 2017-02-05 DIAGNOSIS — J301 Allergic rhinitis due to pollen: Secondary | ICD-10-CM | POA: Diagnosis not present

## 2017-02-06 ENCOUNTER — Ambulatory Visit
Admission: RE | Admit: 2017-02-06 | Discharge: 2017-02-06 | Disposition: A | Discharge status: Home / Self Care | Payer: Medicare HMO | Source: Ambulatory Visit | Attending: Family Medicine | Admitting: Family Medicine

## 2017-02-06 DIAGNOSIS — Z13820 Encounter for screening for osteoporosis: Secondary | ICD-10-CM | POA: Insufficient documentation

## 2017-02-06 DIAGNOSIS — Z780 Asymptomatic menopausal state: Secondary | ICD-10-CM | POA: Diagnosis not present

## 2017-02-06 DIAGNOSIS — J301 Allergic rhinitis due to pollen: Secondary | ICD-10-CM | POA: Diagnosis not present

## 2017-02-17 DIAGNOSIS — J301 Allergic rhinitis due to pollen: Secondary | ICD-10-CM | POA: Diagnosis not present

## 2017-02-20 DIAGNOSIS — R69 Illness, unspecified: Secondary | ICD-10-CM | POA: Diagnosis not present

## 2017-02-24 DIAGNOSIS — J301 Allergic rhinitis due to pollen: Secondary | ICD-10-CM | POA: Diagnosis not present

## 2017-03-03 ENCOUNTER — Ambulatory Visit: Payer: Self-pay | Admitting: Family Medicine

## 2017-03-03 DIAGNOSIS — J301 Allergic rhinitis due to pollen: Secondary | ICD-10-CM | POA: Diagnosis not present

## 2017-03-06 ENCOUNTER — Other Ambulatory Visit: Payer: Self-pay | Admitting: Family Medicine

## 2017-03-06 DIAGNOSIS — F32A Depression, unspecified: Secondary | ICD-10-CM

## 2017-03-06 DIAGNOSIS — F329 Major depressive disorder, single episode, unspecified: Secondary | ICD-10-CM

## 2017-03-10 DIAGNOSIS — J301 Allergic rhinitis due to pollen: Secondary | ICD-10-CM | POA: Diagnosis not present

## 2017-03-13 DIAGNOSIS — H35371 Puckering of macula, right eye: Secondary | ICD-10-CM | POA: Diagnosis not present

## 2017-03-18 DIAGNOSIS — J301 Allergic rhinitis due to pollen: Secondary | ICD-10-CM | POA: Diagnosis not present

## 2017-03-22 DIAGNOSIS — Z0100 Encounter for examination of eyes and vision without abnormal findings: Secondary | ICD-10-CM | POA: Diagnosis not present

## 2017-03-24 DIAGNOSIS — J301 Allergic rhinitis due to pollen: Secondary | ICD-10-CM | POA: Diagnosis not present

## 2017-03-31 DIAGNOSIS — J301 Allergic rhinitis due to pollen: Secondary | ICD-10-CM | POA: Diagnosis not present

## 2017-04-03 ENCOUNTER — Ambulatory Visit (INDEPENDENT_AMBULATORY_CARE_PROVIDER_SITE_OTHER): Payer: Medicare HMO | Admitting: Cardiovascular Disease

## 2017-04-03 ENCOUNTER — Other Ambulatory Visit
Admission: RE | Admit: 2017-04-03 | Discharge: 2017-04-03 | Disposition: A | Discharge status: Home / Self Care | Payer: Medicare HMO | Source: Ambulatory Visit | Attending: Cardiovascular Disease | Admitting: Cardiovascular Disease

## 2017-04-03 ENCOUNTER — Encounter: Payer: Self-pay | Admitting: Cardiovascular Disease

## 2017-04-03 VITALS — BP 120/64 | HR 82 | Ht 64.0 in | Wt 219.5 lb

## 2017-04-03 DIAGNOSIS — I482 Chronic atrial fibrillation, unspecified: Secondary | ICD-10-CM

## 2017-04-03 DIAGNOSIS — R0989 Other specified symptoms and signs involving the circulatory and respiratory systems: Secondary | ICD-10-CM

## 2017-04-03 LAB — BASIC METABOLIC PANEL
ANION GAP: 8 (ref 5–15)
BUN: 16 mg/dL (ref 6–20)
CHLORIDE: 102 mmol/L (ref 101–111)
CO2: 28 mmol/L (ref 22–32)
Calcium: 9.1 mg/dL (ref 8.9–10.3)
Creatinine, Ser: 0.82 mg/dL (ref 0.44–1.00)
GFR calc non Af Amer: >60 mL/min (ref >60)
Glucose, Bld: 100 mg/dL — ABNORMAL HIGH (ref 65–99)
Potassium: 5 mmol/L (ref 3.5–5.1)
Sodium: 138 mmol/L (ref 135–145)

## 2017-04-03 LAB — CBC
HCT: 40.3 % (ref 35.0–47.0)
HEMOGLOBIN: 13.4 g/dL (ref 12.0–16.0)
MCH: 29.8 pg (ref 26.0–34.0)
MCHC: 33.2 g/dL (ref 32.0–36.0)
MCV: 89.6 fL (ref 80.0–100.0)
Platelets: 186 10*3/uL (ref 150–440)
RBC: 4.5 MIL/uL (ref 3.80–5.20)
RDW: 15.1 % — ABNORMAL HIGH (ref 11.5–14.5)
WBC: 8.8 10*3/uL (ref 3.6–11.0)

## 2017-04-03 NOTE — Patient Instructions (Signed)
Medication Instructions:  Your physician recommends that you continue on your current medications as directed. Please refer to the Current Medication list given to you today.   Labwork: BMET, [facility]  Testing/Procedures: none  Follow-Up: Your physician wants you to follow-up in: one year with Dr. Kirke CorinArida.  You will receive a reminder letter in the mail two months in advance. If you don't receive a letter, please call our office to schedule the follow-up appointment.   Any Other Special Instructions Will Be Listed Below (If Applicable).     If you need a refill on your cardiac medications before your next appointment, please call your pharmacy.

## 2017-04-03 NOTE — Progress Notes (Signed)
Cardiology Office Note   Date:  04/03/2017   ID:  Crystal Haas, DOB 08/13/1946, MRN [id number]  PCP:  Glori LuisSonnenberg, Eric G, MD  Cardiologist:   Lorine BearsMuhammad Rea Kalama, MD   Chief Complaint  Patient presents with  . other    12 month follow up. Meds reviewed by the pt. verbally. Pt. c/o not being able to sleep at all.       History of Present Illness: Crystal LollDiane Haas is a 71 y.o. female who presents for a follow-up visit regarding asymptomatic chronic atrial fibrillation. She has no previous history of hypertension, diabetes, stroke or heart failure.  Echocardiogram in July, 2016 showed normal LV systolic function with mildly dilated right and left atrium.  She is tolerating anticoagulation with Eliquis.    Since last year, she underwent multiple surgeries including breast reduction, knee surgery and eye surgery. She has been doing well overall with no chest pain, shortness of breath or palpitations. Her biggest issue continues to be insomnia. She uses CPAP regularly.  Past Medical History:  Diagnosis Date  . Anemia    distant past  . Anxiety   . Arthritis    "everywhere" - big toes worst  . Atrial fibrillation (HCC)   . Depression   . Dysrhythmia    a-fib  . Heart murmur    mild - followed by PCP  . Knee pain   . Motion sickness    back seat of car  . New onset a-fib (HCC)   . Seasonal allergies    takes allergy weekly  . Sleep apnea    [facility]    Past Surgical History:  Procedure Laterality Date  . BREAST REDUCTION SURGERY Bilateral 08/29/2016   Procedure: BILATERAL MAMMARY REDUCTION  (BREAST)WITH LIPOSUCTION;  Surgeon: Peggye Formlaire S Dillingham, DO;  Location: [facility];  Service: Plastics;  Laterality: Bilateral;  . CATARACT EXTRACTION W/PHACO Left 05/03/2015   Procedure: CATARACT EXTRACTION PHACO AND INTRAOCULAR LENS PLACEMENT (IOC);  Surgeon: Lockie Molahadwick Brasington, MD;  [facility];  Service: Ophthalmology;  Laterality: Left;  CPAP    . CATARACT EXTRACTION W/PHACO Right 10/09/2016   Procedure: CATARACT EXTRACTION PHACO AND INTRAOCULAR LENS PLACEMENT (IOC);  Surgeon: Lockie Molahadwick Brasington, MD;  [facility];  Service: Ophthalmology;  Laterality: Right;  sleep apnea  . CHONDROPLASTY Right 04/29/2016   Procedure: CHONDROPLASTY;  Surgeon: Donato HeinzJames P Hooten, MD;  Location: [facility];  Service: Orthopedics;  Laterality: Right;  . EYE SURGERY    . KNEE ARTHROSCOPY WITH LATERAL MENISECTOMY  04/29/2016   Procedure: KNEE ARTHROSCOPY WITH LATERAL MENISECTOMY;  Surgeon: Donato HeinzJames P Hooten, MD;  Location: [facility];  Service: Orthopedics;;  . KNEE ARTHROSCOPY WITH MEDIAL MENISECTOMY  04/29/2016   Procedure: KNEE ARTHROSCOPY WITH MEDIAL MENISECTOMY;  Surgeon: Donato HeinzJames P Hooten, MD;  Location: [facility];  Service: Orthopedics;;  . RETINAL DETACHMENT SURGERY Left May 03, 2015   [facility]ton, [facility]  . TUBAL LIGATION       Current Outpatient Prescriptions  Medication Sig Dispense Refill  . apixaban (ELIQUIS) 5 MG TABS tablet Take 1 tablet (5 mg total) by mouth 2 (two) times daily. 60 tablet 9  . ARTIFICIAL TEARS 0.1-0.3 % SOLN Place 1 drop into both eyes daily.  5  . atorvastatin (LIPITOR) 20 MG tablet Take 1 tablet (20 mg total) by mouth daily. 90 tablet 3  . cetirizine (ZYRTEC) 10 MG tablet Take 10 mg by mouth at bedtime.    . cholecalciferol (VITAMIN  D) 1000 UNITS tablet Take 2,000 Units by mouth daily.    . diphenhydramine-acetaminophen (TYLENOL PM) 25-500 MG TABS Take 1 tablet by mouth at bedtime as needed (AS NEEDED FOR SLEEP).     Marland Kitchen EPIPEN 2-PAK 0.3 MG/0.3ML SOAJ injection Inject as directed as directed. AS NEEDED FOR ANAPHYLAXIS    . fluticasone (FLONASE) 50 MCG/ACT nasal spray Place 2 sprays into both nostrils daily.     . Melatonin 3 MG TABS Take 3 mg by mouth at bedtime.    . Multiple Vitamins-Minerals (CENTRUM PO) Take 1 tablet by mouth daily. AM    . PARoxetine (PAXIL) 20 MG tablet TAKE 1 TABLET BY MOUTH EVERY DAY 90  tablet 0  . PATADAY 0.2 % SOLN Place 1 drop into both eyes daily.     No current facility-administered medications for this visit.     Allergies:   Apple; Daucus carota; Ivp dye [iodinated diagnostic agents]; Other; Strawberry (diagnostic); Strawberry extract; Iodine; and Tape    Social History:  The patient  reports that she quit smoking about 47 years ago. Her smoking use included Cigarettes. She smoked 0.25 packs per day. She has never used smokeless tobacco. She reports that she drinks about 4.2 oz of alcohol per week . She reports that she does not use drugs.      ROS:  Please see the history of present illness.   Otherwise, review of systems are positive for none.   All other systems are reviewed and negative.    PHYSICAL EXAM: VS:  Ht 5\' 4"  (1.626 m)   Wt 219 lb 8 oz (99.6 kg)   BMI 37.68 kg/m  , BMI Body mass index is 37.68 kg/m. GEN: Well nourished, well developed, in no acute distress  HEENT: normal  Neck: no JVD, carotid bruits, or masses Cardiac: Irregularly irregular; no murmurs, rubs, or gallops,no edema  Respiratory:  clear to auscultation bilaterally, normal work of breathing GI: soft, nontender, nondistended, + BS MS: no deformity or atrophy  Skin: warm and dry, no rash Neuro:  Strength and sensation are intact Psych: euthymic mood, full affect   EKG:  EKG  ordered today. EKG showed atrial fibrillation with low voltage and possible old inferior infarct.   Recent Labs: 04/19/2016: Hemoglobin 13.5; Platelets 200 09/02/2016: TSH 2.17 09/30/2016: ALT 11; BUN 13; Creatinine, Ser 0.88; Potassium 4.4; Sodium 140    Lipid Panel    Component Value Date/Time   CHOL 181 09/02/2016 1031   CHOL 186 09/04/2015 1051   TRIG 98.0 09/02/2016 1031   HDL 59.90 09/02/2016 1031   HDL 66 09/04/2015 1051   CHOLHDL 3 09/02/2016 1031   VLDL 19.6 09/02/2016 1031   LDLCALC 102 (H) 09/02/2016 1031   LDLCALC 99 09/04/2015 1051   LDLDIRECT 60.0 09/30/2016 1129      Wt  Readings from Last 3 Encounters:  04/03/17 219 lb 8 oz (99.6 kg)  01/01/17 219 lb 12.8 oz (99.7 kg)  10/09/16 237 lb (107.5 kg)        ASSESSMENT AND PLAN:   Chronic atrial fibrillation: Ventricular rate is controlled without any medication. She is tolerating anticoagulation with no side effects.I requested CBC and basic metabolic profile.     Disposition:   FU with me in 1 year  Signed,  Lorine Bears, MD  04/03/2017 3:24 PM    [facility]

## 2017-04-07 DIAGNOSIS — J301 Allergic rhinitis due to pollen: Secondary | ICD-10-CM | POA: Diagnosis not present

## 2017-04-08 ENCOUNTER — Ambulatory Visit: Payer: Self-pay | Admitting: Family Medicine

## 2017-04-08 DIAGNOSIS — M76891 Other specified enthesopathies of right lower limb, excluding foot: Secondary | ICD-10-CM | POA: Diagnosis not present

## 2017-04-08 DIAGNOSIS — M76899 Other specified enthesopathies of unspecified lower limb, excluding foot: Secondary | ICD-10-CM | POA: Diagnosis not present

## 2017-04-08 DIAGNOSIS — M1711 Unilateral primary osteoarthritis, right knee: Secondary | ICD-10-CM | POA: Diagnosis not present

## 2017-04-11 ENCOUNTER — Ambulatory Visit (INDEPENDENT_AMBULATORY_CARE_PROVIDER_SITE_OTHER): Payer: Medicare HMO | Admitting: Family Medicine

## 2017-04-11 ENCOUNTER — Encounter: Payer: Self-pay | Admitting: Family Medicine

## 2017-04-11 ENCOUNTER — Telehealth: Payer: Self-pay

## 2017-04-11 DIAGNOSIS — F5104 Psychophysiologic insomnia: Secondary | ICD-10-CM

## 2017-04-11 DIAGNOSIS — I482 Chronic atrial fibrillation, unspecified: Secondary | ICD-10-CM

## 2017-04-11 DIAGNOSIS — G4730 Sleep apnea, unspecified: Secondary | ICD-10-CM | POA: Diagnosis not present

## 2017-04-11 DIAGNOSIS — R69 Illness, unspecified: Secondary | ICD-10-CM | POA: Diagnosis not present

## 2017-04-11 MED ORDER — ZOLPIDEM TARTRATE 5 MG PO TABS: 5.0000 mg | ORAL_TABLET | Freq: Every evening | ORAL | 1 refills | Status: DC | PRN

## 2017-04-11 NOTE — Assessment & Plan Note (Addendum)
Has worsened. Crystal PattenLunesta was beneficial. Insurance would not cover this. Had long discussion of options. Given potential risk for serotonin syndrome with trazodone we opted for Ambien. Could try resubmitting Lunesta in the future. Could also consider a sleep specialist referral. She will stop drinking alcohol or right before bed. She will not watch TV before bed.

## 2017-04-11 NOTE — Assessment & Plan Note (Signed)
Stable. Rate is controlled without medications. No bleeding with Eliquis. She'll monitor.

## 2017-04-11 NOTE — Patient Instructions (Signed)
Nice to see you. We'll try Ambien to help you sleep. You should continue melatonin. Please decrease your Tylenol PM to 1 tablet daily. Please move back when you're drinking your scotch and water. Please also do not watch TV the hour before bed. If you develop excessive drowsiness please let us know.

## 2017-04-11 NOTE — Assessment & Plan Note (Signed)
Had been well controlled. I think her insomnia is playing a role currently. She'll continue her CPAP.

## 2017-04-11 NOTE — Progress Notes (Signed)
  Marikay AlarEric Symphany Fleissner, MD Phone: [phone]  Crystal Haas is a 71 y.o. female who presents today for follow-up.  OSA: Patient is not sleeping well due to insomnia. She does wear CPAP all night. Crystal Haas is not great. She's not well rested she is only getting 3-4 hours of Haas nightly.  Insomnia: Having significant issues with this. Taking melatonin. Also taking Tylenol PM 2 tablets nightly. Only gets 3-4 hours a night and it is intermittent. Has trouble falling asleep. Has trouble staying asleep as well. Alfonso PattenLunesta was beneficial though her insurance wouldn't cover it. She does have a scotch and water right before bed. Additionally has been watching TV the hour before bed. She was previously on Ambien and trazodone and she is unsure of their prior benefit.  A. fib: Currently on Eliquis. No bleeding. No palpitations. No chest pain.  PMH: Former smoker   ROS see history of present illness  Objective  Physical Exam Vitals:   04/11/17 0837  BP: 128/82  Pulse: 63  Temp: 98.4 F (36.9 C)    BP Readings from Last 3 Encounters:  04/11/17 128/82  04/03/17 120/64  01/01/17 140/80   Wt Readings from Last 3 Encounters:  04/11/17 224 lb 12.8 oz (102 kg)  04/03/17 219 lb 8 oz (99.6 kg)  01/01/17 219 lb 12.8 oz (99.7 kg)    Physical Exam  Constitutional: No distress.  HENT:  Head: Normocephalic and atraumatic.  Mouth/Throat: Oropharynx is clear and moist. No oropharyngeal exudate.  Cardiovascular: Normal rate and normal heart sounds.  An irregularly irregular rhythm present.  Pulmonary/Chest: Effort normal and breath sounds normal.  Musculoskeletal: She exhibits no edema.  Neurological: She is alert. Gait normal.  Skin: She is not diaphoretic.     Assessment/Plan: Please see individual problem list.  Chronic atrial fibrillation (HCC) Stable. Rate is controlled without medications. No bleeding with Eliquis. She'll monitor.  Haas apnea Had been well controlled. I think her  insomnia is playing a role currently. She'll continue her CPAP.  Chronic insomnia Has worsened. Alfonso PattenLunesta was beneficial. Insurance would not cover this. Had long discussion of options. Given potential risk for serotonin syndrome with trazodone we opted for Ambien. Could try resubmitting Lunesta in the future. Could also consider a Haas specialist referral. She will stop drinking alcohol or right before bed. She will not watch TV before bed.   No orders of the defined types were placed in this encounter.   Meds ordered this encounter  Medications  . zolpidem (AMBIEN) 5 MG tablet    Sig: Take 1 tablet (5 mg total) by mouth at bedtime as needed for Haas.    Dispense:  30 tablet    Refill:  1   Marikay AlarEric Kitt Ledet, MD [facility]

## 2017-04-11 NOTE — Telephone Encounter (Signed)
PA for zolpidem tartrate started on cover my meds

## 2017-04-14 DIAGNOSIS — J301 Allergic rhinitis due to pollen: Secondary | ICD-10-CM | POA: Diagnosis not present

## 2017-04-14 NOTE — Telephone Encounter (Signed)
PA approved from 10/19/16-10/20/2017.  Referral number [id number]

## 2017-04-15 NOTE — Addendum Note (Signed)
Addended by: Festus AloeRESPO, SHARON G on: 04/15/2017 10:37 AM   Modules accepted: Orders

## 2017-04-18 ENCOUNTER — Other Ambulatory Visit: Payer: Self-pay | Admitting: Cardiovascular Disease

## 2017-04-22 DIAGNOSIS — J301 Allergic rhinitis due to pollen: Secondary | ICD-10-CM | POA: Diagnosis not present

## 2017-04-29 DIAGNOSIS — J301 Allergic rhinitis due to pollen: Secondary | ICD-10-CM | POA: Diagnosis not present

## 2017-05-05 DIAGNOSIS — J301 Allergic rhinitis due to pollen: Secondary | ICD-10-CM | POA: Diagnosis not present

## 2017-05-13 DIAGNOSIS — J301 Allergic rhinitis due to pollen: Secondary | ICD-10-CM | POA: Diagnosis not present

## 2017-05-15 DIAGNOSIS — G4733 Obstructive sleep apnea (adult) (pediatric): Secondary | ICD-10-CM | POA: Diagnosis not present

## 2017-05-19 DIAGNOSIS — J301 Allergic rhinitis due to pollen: Secondary | ICD-10-CM | POA: Diagnosis not present

## 2017-05-27 DIAGNOSIS — J301 Allergic rhinitis due to pollen: Secondary | ICD-10-CM | POA: Diagnosis not present

## 2017-05-30 ENCOUNTER — Other Ambulatory Visit: Payer: Self-pay | Admitting: Family Medicine

## 2017-05-30 DIAGNOSIS — F32A Depression, unspecified: Secondary | ICD-10-CM

## 2017-05-30 DIAGNOSIS — F329 Major depressive disorder, single episode, unspecified: Secondary | ICD-10-CM

## 2017-06-02 DIAGNOSIS — J301 Allergic rhinitis due to pollen: Secondary | ICD-10-CM | POA: Diagnosis not present

## 2017-06-09 DIAGNOSIS — J301 Allergic rhinitis due to pollen: Secondary | ICD-10-CM | POA: Diagnosis not present

## 2017-06-18 DIAGNOSIS — J301 Allergic rhinitis due to pollen: Secondary | ICD-10-CM | POA: Diagnosis not present

## 2017-06-24 ENCOUNTER — Other Ambulatory Visit: Payer: Self-pay | Admitting: Family Medicine

## 2017-06-24 DIAGNOSIS — J301 Allergic rhinitis due to pollen: Secondary | ICD-10-CM | POA: Diagnosis not present

## 2017-06-24 NOTE — Telephone Encounter (Signed)
Last OV 04/11/2017 Next OV 10/10/2017 Last refill 04/11/2017

## 2017-06-25 NOTE — Telephone Encounter (Signed)
Script faxed.

## 2017-06-30 DIAGNOSIS — J301 Allergic rhinitis due to pollen: Secondary | ICD-10-CM | POA: Diagnosis not present

## 2017-07-07 DIAGNOSIS — J301 Allergic rhinitis due to pollen: Secondary | ICD-10-CM | POA: Diagnosis not present

## 2017-07-16 ENCOUNTER — Other Ambulatory Visit: Payer: Self-pay | Admitting: Family Medicine

## 2017-07-17 ENCOUNTER — Ambulatory Visit (INDEPENDENT_AMBULATORY_CARE_PROVIDER_SITE_OTHER): Payer: Medicare HMO | Admitting: Family Medicine

## 2017-07-17 ENCOUNTER — Ambulatory Visit: Payer: Self-pay

## 2017-07-17 ENCOUNTER — Encounter: Payer: Self-pay | Admitting: Family Medicine

## 2017-07-17 VITALS — BP 138/84 | HR 99 | Ht 64.5 in | Wt 234.0 lb

## 2017-07-17 DIAGNOSIS — M25561 Pain in right knee: Principal | ICD-10-CM

## 2017-07-17 DIAGNOSIS — G8929 Other chronic pain: Secondary | ICD-10-CM

## 2017-07-17 DIAGNOSIS — M1711 Unilateral primary osteoarthritis, right knee: Secondary | ICD-10-CM | POA: Insufficient documentation

## 2017-07-17 NOTE — Patient Instructions (Signed)
Good to see you.  Ice 20 minutes 2 times daily. Usually after activity and before bed. Exercises 3 times a week.  Tylenol  3 times a day  Vitamin D 2000 IU daily  Turmeric  daily  Tart cherry extract any dose at home.  See me again in 4 weeks and we will discuss orthovisc.

## 2017-07-17 NOTE — Telephone Encounter (Signed)
Last filled on 06/25/2017, #30 with 0 refills.  Last appt was 04/11/17.  Please advise ,thanks

## 2017-07-17 NOTE — Telephone Encounter (Signed)
Please check with patient to see if this has been beneficial. Prescription printed. Please place on my desk to sign.

## 2017-07-17 NOTE — Assessment & Plan Note (Signed)
Patient given injection today and tolerated the procedure well. I do think the patient may need viscous supplementation. Has failed all other conservative therapy by another provider previously. We discussed the possibility of custom bracing dened. We discussed dications. We discussed which ato do in  Which ones to avoid. PATIENT will come back in 4-6 WEEKS

## 2017-07-17 NOTE — Progress Notes (Signed)
[name] [address] [place], [place] [address] Phone: [phone] Subjective:    I'm seeing this patient by the request  of:    CC: Right knee pain  [id number]  Crystal Haas is a 71 y.o. female coming in for right knee pain. She has meniscus surgery in 2017. Two weeks later she went to church and kneeled for communion and she heard a weird noise and her leg started hurting. She has had an injection in the knee by a PA of Dr. Ernest Pine. She then went back to the doctor and was told that her pain was from nerves. Her pain radiates up the leg and into her back but not down the leg.   Patient is at the sometimes instability. Patient rates the severity pain is 7 out of 10. Tries not to take anti-inflammatories but when she does this seems to help. Patient is on chronic blood thinners.    Past Medical History:  Diagnosis Date  . Anemia    distant past  . Anxiety   . Arthritis    "everywhere" - big toes worst  . Atrial fibrillation (HCC)   . Depression   . Dysrhythmia    a-fib  . Heart murmur    mild - followed by PCP  . Knee pain   . Motion sickness    back seat of car  . New onset a-fib (HCC)   . Seasonal allergies    takes allergy weekly  . Sleep apnea    uses CPAP SS - [facility] 2013   Past Surgical History:  Procedure Laterality Date  . BREAST REDUCTION SURGERY Bilateral 08/29/2016   Procedure: BILATERAL MAMMARY REDUCTION  (BREAST)WITH LIPOSUCTION;  Surgeon: Peggye Form, DO;  Location: [facility];  Service: Plastics;  Laterality: Bilateral;  . CATARACT EXTRACTION W/PHACO Left 05/03/2015   Procedure: CATARACT EXTRACTION PHACO AND INTRAOCULAR LENS PLACEMENT (IOC);  Surgeon: Lockie Mola, MD;  Location: [facility];  Service: Ophthalmology;  Laterality: Left;  CPAP  . CATARACT EXTRACTION W/PHACO Right 10/09/2016   Procedure: CATARACT EXTRACTION PHACO AND INTRAOCULAR LENS PLACEMENT (IOC);  Surgeon: Lockie Mola, MD;  Location: [facility];  Service: Ophthalmology;  Laterality: Right;  sleep apnea  . CHONDROPLASTY Right 04/29/2016   Procedure: CHONDROPLASTY;  Surgeon: Donato Heinz, MD;  Location: [facility];  Service: Orthopedics;  Laterality: Right;  . EYE SURGERY    . KNEE ARTHROSCOPY WITH LATERAL MENISECTOMY  04/29/2016   Procedure: KNEE ARTHROSCOPY WITH LATERAL MENISECTOMY;  Surgeon: Donato Heinz, MD;  Location: [facility];  Service: Orthopedics;;  . KNEE ARTHROSCOPY WITH MEDIAL MENISECTOMY  04/29/2016   Procedure: KNEE ARTHROSCOPY WITH MEDIAL MENISECTOMY;  Surgeon: Donato Heinz, MD;  Location: [facility];  Service: Orthopedics;;  . RETINAL DETACHMENT SURGERY Left May 03, 2015   Dr. Inez Pilgrim, [facility]  . TUBAL LIGATION     Social History   Social History  . Marital status: Widowed    Spouse name: N/A  . Number of children: N/A  . Years of education: N/A   Social History Main Topics  . Smoking status: Former Smoker    Packs/day: 0.25    Types: Cigarettes    Quit date: 10/21/1969  . Smokeless tobacco: Never Used  . Alcohol use 4.2 oz/week    7 Shots of liquor per week     Comment: daily  . Drug use: No  . Sexual activity: Not Asked   Other Topics Concern  .  None   Social History Narrative  . None   Allergies  Allergen Reactions  . Apple Anaphylaxis    Throat swells but subsides with po benadry  . Daucus Carota Anaphylaxis    Peeling of carrot only but subsides with po benadry  . Ivp Dye [Iodinated Diagnostic Agents] Shortness Of Breath    Also swelling.  Topical betadine is OK.  . Other Swelling    Raw fruits cause throat to swell. Topical contact with carrots cause hands to swell, ok to eat.  . Strawberry (Diagnostic) Anaphylaxis    Throat swells but subsides with benadryl  . Strawberry Extract Anaphylaxis    Throat swells but subsides with benadryl  . Iodine Swelling    IV   . Tape Other (See Comments)    Most tapes case raw skin.  Paper tape is OK.    Family History  Problem Relation Age of Onset  . Alcoholism Unknown   . Heart disease Unknown   . Breast cancer Neg Hx      Past medical history, social, surgical and family history all reviewed in electronic medical record.  No pertanent information unless stated regarding to the chief complaint.   Review of Systems:Review of systems updated and as accurate as of 07/17/17  No headache, visual changes, nausea, vomiting, diarrhea, constipation, dizziness, abdominal pain, skin rash, fevers, chills, night sweats, weight loss, swollen lymph nodes, body aches, joint swelling,, chest pain, shortness of breath, mood changes. Positive muscle aches  Objective  Blood pressure 138/84, pulse 99, height 5' 4.5" (1.638 m), weight 234 lb (106.1 kg), SpO2 96 %. Systems examined below as of 07/17/17   General: No apparent distress alert and oriented x3 mood and affect normal, dressed appropriately.  HEENT: Pupils equal, extraocular movements intact  Respiratory: Patient's speak in full sentences and does not appear short of breath  Cardiovascular: No lower extremity edema, non tender, no erythema  Skin: Warm dry intact with no signs of infection or rash on extremities or on axial skeleton.  Abdomen: Soft nontender  Neuro: Cranial nerves II through XII are intact, neurovascularly intact in all extremities with 2+ DTRs and 2+ pulses.  Lymph: No lymphadenopathy of posterior or anterior cervical chain or axillae bilaterally.  Gait Antalgic.  MSK:  Non tender with full range of motion and good stability and symmetric strength and tone of shoulders, elbows, wrist, hip, and ankles bilaterally.  Knee: Right knee valgus deformity noted. Large thigh to calf ratio.  Tender to palpation over medial and PF joint line.  ROM full in flexion and extension and lower leg rotation. instability with varus force  painful patellar compression. Patellar glide with moderate crepitus. Patellar and quadriceps tendons  unremarkable. Hamstring and quadriceps strength is normal. Contralateral knee shows mild crepitus but no instability   MSK US performed of: Right knee This study was ordered, performed, and interpreted by Terrilee Files D.O.  Knee: Patient is in severe narrowing of the patellofemoral, and lateral compartments of the knee. Mild narrowing of the medial compartment. Patient does have near bone-on-bone of the lateral compartment. Does have what appears to be a large. Meniscal cyst on the lateral joint line as well as an LCL degenerative tear.  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 Right knee was prepped with alcohol swab and utilizing anterolateral approach, patient's right knee space was injected with 4:1  marcaine 0.5%: Kenalog /dL. Patient  tolerated the procedure well without immediate complications.    Impression and Recommendations:     This case required medical decision making of moderate complexity.      Note: This dictation was prepared with Dragon dictation along with smaller phrase technology. Any transcriptional errors that result from this process are unintentional.

## 2017-07-17 NOTE — Telephone Encounter (Signed)
Patient states it has worked very well, place on your desk to sign

## 2017-07-18 ENCOUNTER — Telehealth: Payer: Self-pay | Admitting: Cardiovascular Disease

## 2017-07-18 NOTE — Telephone Encounter (Signed)
Pt states yesterday she was prescribed Tumeric for inflammation. and asks can she take with her Eliquis. Please call and advise

## 2017-07-18 NOTE — Telephone Encounter (Signed)
Reviewed recommendations w/pt who verbalized understanding.  

## 2017-07-18 NOTE — Telephone Encounter (Signed)
That is fine 

## 2017-07-18 NOTE — Telephone Encounter (Signed)
Crystal Primas, DO prescribed tumeric  qd for chronic knee inflammation. Pt taking eliquis  BID. As tumeric and elquis together can increase chance of bleeding, will route to Dr. Kirke Corin to advise.

## 2017-07-21 DIAGNOSIS — J301 Allergic rhinitis due to pollen: Secondary | ICD-10-CM | POA: Diagnosis not present

## 2017-07-28 DIAGNOSIS — J301 Allergic rhinitis due to pollen: Secondary | ICD-10-CM | POA: Diagnosis not present

## 2017-08-04 DIAGNOSIS — J301 Allergic rhinitis due to pollen: Secondary | ICD-10-CM | POA: Diagnosis not present

## 2017-08-12 DIAGNOSIS — R69 Illness, unspecified: Secondary | ICD-10-CM | POA: Diagnosis not present

## 2017-08-14 ENCOUNTER — Ambulatory Visit (INDEPENDENT_AMBULATORY_CARE_PROVIDER_SITE_OTHER): Payer: Medicare HMO | Admitting: Family Medicine

## 2017-08-14 ENCOUNTER — Encounter: Payer: Self-pay | Admitting: Family Medicine

## 2017-08-14 DIAGNOSIS — Z23 Encounter for immunization: Secondary | ICD-10-CM | POA: Diagnosis not present

## 2017-08-14 DIAGNOSIS — M1711 Unilateral primary osteoarthritis, right knee: Secondary | ICD-10-CM | POA: Diagnosis not present

## 2017-08-14 NOTE — Patient Instructions (Signed)
Good to see you  Crystal Haas is your friend.  Maybe try turmeric at a lower dose.  pennsaid pinkie amount topically 2 times daily as needed.   We tried monovisc today  Walk if that is what yo want but then do the ice after  See me again in 6-8 weeks.

## 2017-08-14 NOTE — Progress Notes (Signed)
[facility] [address] [place], [place] [address] Phone: [phone] Subjective:     CC: knee pain follow-up  [id number]  Crystal Haas is a 71 y.o. female coming in with complaint of right knee pain. Found to have degenerative arthritis. Patient states injection did he 3 weeks and then worse. Now having still instability. Patient states it is affecting daily activities. Even waking her up at night. Patient has declined bracing previously. Does not want any surgical intervention.       Past Medical History:  Diagnosis Date  . Anemia    distant past  . Anxiety   . Arthritis    "everywhere" - big toes worst  . Atrial fibrillation (HCC)   . Depression   . Dysrhythmia    a-fib  . Heart murmur    mild - followed by PCP  . Knee pain   . Motion sickness    back seat of car  . New onset a-fib (HCC)   . Seasonal allergies    takes allergy weekly  . Sleep apnea    uses CPAP SS - [facility] 2013   Past Surgical History:  Procedure Laterality Date  . BREAST REDUCTION SURGERY Bilateral 08/29/2016   Procedure: BILATERAL MAMMARY REDUCTION  (BREAST)WITH LIPOSUCTION;  Surgeon: Peggye Form, DO;  Location: [facility];  Service: Plastics;  Laterality: Bilateral;  . CATARACT EXTRACTION W/PHACO Left 05/03/2015   Procedure: CATARACT EXTRACTION PHACO AND INTRAOCULAR LENS PLACEMENT (IOC);  Surgeon: Lockie Mola, MD;  Location: [facility];  Service: Ophthalmology;  Laterality: Left;  CPAP  . CATARACT EXTRACTION W/PHACO Right 10/09/2016   Procedure: CATARACT EXTRACTION PHACO AND INTRAOCULAR LENS PLACEMENT (IOC);  Surgeon: Lockie Mola, MD;  Location: [facility];  Service: Ophthalmology;  Laterality: Right;  sleep apnea  . CHONDROPLASTY Right 04/29/2016   Procedure: CHONDROPLASTY;  Surgeon: Donato Heinz, MD;  Location: [facility];  Service: Orthopedics;  Laterality: Right;  . EYE SURGERY    . KNEE ARTHROSCOPY WITH  LATERAL MENISECTOMY  04/29/2016   Procedure: KNEE ARTHROSCOPY WITH LATERAL MENISECTOMY;  Surgeon: Donato Heinz, MD;  Location: [facility];  Service: Orthopedics;;  . KNEE ARTHROSCOPY WITH MEDIAL MENISECTOMY  04/29/2016   Procedure: KNEE ARTHROSCOPY WITH MEDIAL MENISECTOMY;  Surgeon: Donato Heinz, MD;  Location: [facility];  Service: Orthopedics;;  . RETINAL DETACHMENT SURGERY Left May 03, 2015   Dr. Inez Pilgrim, [facility]  . TUBAL LIGATION     Social History   Social History  . Marital status: Widowed    Spouse name: N/A  . Number of children: N/A  . Years of education: N/A   Social History Main Topics  . Smoking status: Former Smoker    Packs/day: 0.25    Types: Cigarettes    Quit date: 10/21/1969  . Smokeless tobacco: Never Used  . Alcohol use 4.2 oz/week    7 Shots of liquor per week     Comment: daily  . Drug use: No  . Sexual activity: Not Asked   Other Topics Concern  . None   Social History Narrative  . None   Allergies  Allergen Reactions  . Apple Anaphylaxis    Throat swells but subsides with po benadry  . Daucus Carota Anaphylaxis    Peeling of carrot only but subsides with po benadry  . Ivp Dye [Iodinated Diagnostic Agents] Shortness Of Breath    Also swelling.  Topical betadine is OK.  . Other Swelling  Raw fruits cause throat to swell. Topical contact with carrots cause hands to swell, ok to eat.  . Strawberry (Diagnostic) Anaphylaxis    Throat swells but subsides with benadryl  . Strawberry Extract Anaphylaxis    Throat swells but subsides with benadryl  . Iodine Swelling    IV   . Tape Other (See Comments)    Most tapes case raw skin.  Paper tape is OK.   Family History  Problem Relation Age of Onset  . Alcoholism Unknown   . Heart disease Unknown   . Breast cancer Neg Hx      Past medical history, social, surgical and family history all reviewed in electronic medical record.  No pertanent information unless stated regarding to the chief complaint.    Review of Systems:Review of systems updated and as accurate as of 08/14/17  No headache, visual changes, nausea, vomiting, diarrhea, constipation, dizziness, abdominal pain, skin rash, fevers, chills, night sweats, weight loss, swollen lymph nodes, body aches, chest pain, shortness of breath, mood changes. Positive muscle aches and joint swelling  Objective  Blood pressure (!) 150/70, pulse 70, height 5\' 4"  (1.626 m), weight 232 lb (105.2 kg), SpO2 97 %. Systems examined below as of 08/14/17   General: No apparent distress alert and oriented x3 mood and affect normal, dressed appropriately.  HEENT: Pupils equal, extraocular movements intact  Respiratory: Patient's speak in full sentences and does not appear short of breath  Cardiovascular: No lower extremity edema, non tender, no erythema  Skin: Warm dry intact with no signs of infection or rash on extremities or on axial skeleton.  Abdomen: Soft nontender  Neuro: Cranial nerves II through XII are intact, neurovascularly intact in all extremities with 2+ DTRs and 2+ pulses.  Lymph: No lymphadenopathy of posterior or anterior cervical chain or axillae bilaterally.  Gait normal with good balance and coordination.  MSK:  Non tender with full range of motion and good stability and symmetric strength and tone of shoulders, elbows, wrist, hip, and ankles bilaterally.  Knee:Right valgus deformity noted. Large thigh to calf ratio.  Tender to palpation over medial and PF joint line.  ROM full in flexion and extension and lower leg rotation. instability with valgus force.  painful patellar compression. Patellar glide with moderate crepitus. Patellar and quadriceps tendons unremarkable. Hamstring and quadriceps strength is normal. Contralateral knee shows mild arthritic changes.  After informed written and verbal consent, patient was seated on exam table. Right knee was prepped with alcohol swab and utilizing anterolateral approach, patient's  right knee space was injected with Monovisc injection (22mg /mL) Patient tolerated the procedure well without immediate complications.     Impression and Recommendations:     This case required medical decision making of moderate complexity.      Note: This dictation was prepared with Dragon dictation along with smaller phrase technology. Any transcriptional errors that result from this process are unintentional.

## 2017-08-14 NOTE — Assessment & Plan Note (Signed)
Patient was given viscous supplementation after failing all conservative therapy. Declined custom bracing but I do not feel over-the-counter bracing will be helpful patient's large thigh to calf ratio. We discussed icing regimen and continuing the topical anti-inflammatories. We discussed coming back again in 6-8 weeks for further evaluation and treatment. Has artery done formal physical therapy.

## 2017-08-18 DIAGNOSIS — J301 Allergic rhinitis due to pollen: Secondary | ICD-10-CM | POA: Diagnosis not present

## 2017-08-19 DIAGNOSIS — G4733 Obstructive sleep apnea (adult) (pediatric): Secondary | ICD-10-CM | POA: Diagnosis not present

## 2017-08-19 DIAGNOSIS — R69 Illness, unspecified: Secondary | ICD-10-CM | POA: Diagnosis not present

## 2017-08-25 DIAGNOSIS — J301 Allergic rhinitis due to pollen: Secondary | ICD-10-CM | POA: Diagnosis not present

## 2017-08-26 DIAGNOSIS — R69 Illness, unspecified: Secondary | ICD-10-CM | POA: Diagnosis not present

## 2017-08-27 ENCOUNTER — Other Ambulatory Visit: Payer: Self-pay | Admitting: Family Medicine

## 2017-08-27 DIAGNOSIS — F32A Depression, unspecified: Secondary | ICD-10-CM

## 2017-08-27 DIAGNOSIS — F329 Major depressive disorder, single episode, unspecified: Secondary | ICD-10-CM

## 2017-09-01 DIAGNOSIS — J301 Allergic rhinitis due to pollen: Secondary | ICD-10-CM | POA: Diagnosis not present

## 2017-09-03 DIAGNOSIS — R69 Illness, unspecified: Secondary | ICD-10-CM | POA: Diagnosis not present

## 2017-09-09 DIAGNOSIS — J301 Allergic rhinitis due to pollen: Secondary | ICD-10-CM | POA: Diagnosis not present

## 2017-09-15 DIAGNOSIS — J301 Allergic rhinitis due to pollen: Secondary | ICD-10-CM | POA: Diagnosis not present

## 2017-09-15 DIAGNOSIS — H40003 Preglaucoma, unspecified, bilateral: Secondary | ICD-10-CM | POA: Diagnosis not present

## 2017-09-16 DIAGNOSIS — N62 Hypertrophy of breast: Secondary | ICD-10-CM | POA: Diagnosis not present

## 2017-09-17 DIAGNOSIS — R69 Illness, unspecified: Secondary | ICD-10-CM | POA: Diagnosis not present

## 2017-09-18 ENCOUNTER — Other Ambulatory Visit: Payer: Self-pay | Admitting: Family Medicine

## 2017-09-19 NOTE — Telephone Encounter (Signed)
Last OV 6/18 last refill 9/18 30 1  refill?

## 2017-09-22 DIAGNOSIS — J301 Allergic rhinitis due to pollen: Secondary | ICD-10-CM | POA: Diagnosis not present

## 2017-09-22 DIAGNOSIS — H40003 Preglaucoma, unspecified, bilateral: Secondary | ICD-10-CM | POA: Diagnosis not present

## 2017-10-06 DIAGNOSIS — J301 Allergic rhinitis due to pollen: Secondary | ICD-10-CM | POA: Diagnosis not present

## 2017-10-10 ENCOUNTER — Ambulatory Visit (INDEPENDENT_AMBULATORY_CARE_PROVIDER_SITE_OTHER): Payer: Medicare HMO | Admitting: Family Medicine

## 2017-10-10 ENCOUNTER — Other Ambulatory Visit: Payer: Self-pay

## 2017-10-10 ENCOUNTER — Encounter: Payer: Self-pay | Admitting: Family Medicine

## 2017-10-10 VITALS — BP 130/80 | HR 71 | Temp 98.0°F | Wt 238.0 lb

## 2017-10-10 DIAGNOSIS — Z1239 Encounter for other screening for malignant neoplasm of breast: Secondary | ICD-10-CM

## 2017-10-10 DIAGNOSIS — F3341 Major depressive disorder, recurrent, in partial remission: Secondary | ICD-10-CM

## 2017-10-10 DIAGNOSIS — M25561 Pain in right knee: Secondary | ICD-10-CM | POA: Diagnosis not present

## 2017-10-10 DIAGNOSIS — G8929 Other chronic pain: Secondary | ICD-10-CM | POA: Diagnosis not present

## 2017-10-10 DIAGNOSIS — R69 Illness, unspecified: Secondary | ICD-10-CM | POA: Diagnosis not present

## 2017-10-10 DIAGNOSIS — E785 Hyperlipidemia, unspecified: Secondary | ICD-10-CM | POA: Diagnosis not present

## 2017-10-10 DIAGNOSIS — R202 Paresthesia of skin: Secondary | ICD-10-CM | POA: Diagnosis not present

## 2017-10-10 DIAGNOSIS — Z1231 Encounter for screening mammogram for malignant neoplasm of breast: Secondary | ICD-10-CM

## 2017-10-10 DIAGNOSIS — G629 Polyneuropathy, unspecified: Secondary | ICD-10-CM

## 2017-10-10 NOTE — Progress Notes (Signed)
Tommi Rumps, MD Phone: [phone]  Crystal Haas is a 71 y.o. female who presents today for follow-up.  Patient notes she does not feel depressed or anxious though notes she just does not feel a whole lot.  Her mother died previously and she has been seeing a therapist for the past 6 weeks who is helping her bring out a lot of thoughts regarding things that happened in the past with her mother.  She notes this is helpful.  She notes no SI.  She notes since she started seeing the therapist she has started eating a significantly larger amount of food and has not been able to control her eating habits.  She wants a pill that will make her sick if she eats to see if this will help.  She is interested in seeing a bariatric surgeon to discuss options for treatment.  Patient notes the second through fifth fingertips in her left hand are intermittently numb and tingly.  This has been going on for some time now.  No other numbness or tingling elsewhere.  No weakness.  She does note she has been knitting a significant amount and this may be contributing.  She has followed with orthopedics for her right knee.  She has gotten cortisone and Synvisc injections.  They discussed possible knee replacement.  She is taking Tylenol 3 times daily.    Social History   Tobacco Use  Smoking Status Former Smoker  . Packs/day: 0.25  . Types: Cigarettes  . Last attempt to quit: 10/21/1969  . Years since quitting: 48.0  Smokeless Tobacco Never Used     ROS see history of present illness  Objective  Physical Exam Vitals:   10/10/17 1320  BP: 130/80  Pulse: 71  Temp: 98 F (36.7 C)  SpO2: 95%    BP Readings from Last 3 Encounters:  10/10/17 130/80  08/14/17 (!) 150/70  07/17/17 138/84   Wt Readings from Last 3 Encounters:  10/10/17 238 lb (108 kg)  08/14/17 232 lb (105.2 kg)  07/17/17 234 lb (106.1 kg)    Physical Exam  Constitutional: No distress.  Cardiovascular: Normal rate, regular  rhythm and normal heart sounds.  Pulmonary/Chest: Effort normal and breath sounds normal.  Musculoskeletal: She exhibits no edema.  Right knee with medial joint line tenderness, no significant swelling or erythema, no warmth, left knee with no tenderness, warmth, erythema, or swelling, negative ligamentous laxity and negative McMurray's  Neurological: She is alert. Gait normal.  Skin: Skin is warm and dry. She is not diaphoretic.  CN 2-12 intact, 5/5 strength in bilateral biceps, triceps, grip, quads, hamstrings, plantar and dorsiflexion, sensation to light touch intact in bilateral UE and LE Negative Tinel's and Phalen's   Assessment/Plan: Please see individual problem list.  Hand tingling Isolated to left fingertips.  Neurologically intact today.  Possibly neuropathy related.  Possibly how she is using her hands for knitting.  We will check lab work.  Consider neurology referral.  Depression Patient denies depression and anxiety.  She is not feeling a whole lot currently.  I suspect her increased appetite is a coping mechanism for what she is dealing with.  I encouraged her to discuss this with her therapist.  Morbid obesity ([place]) Greater than 40.  Patient is interested in seeing a bariatric surgeon to discuss treatment.  Right knee pain Chronic issue.  I advised that she contact her orthopedist to discuss further treatment.   Myrel was seen today for follow-up.  Diagnoses and all orders  for this visit:  Neuropathy -     Cancel: B12 -     Cancel: HgB A1c -     B12; Future -     HgB A1c; Future  Hyperlipidemia, unspecified hyperlipidemia type -     Comp Met (CMET); Future -     Lipid panel; Future  Breast cancer screening -     MM SCREENING BREAST TOMO BILATERAL; Future  Left hand paresthesia  Recurrent major depressive disorder, in partial remission ([place])  Morbid obesity ([place]) -     Ambulatory referral to General Surgery  Chronic pain of right knee    Orders  Placed This Encounter  Procedures  . MM SCREENING BREAST TOMO BILATERAL    Standing Status:   Future    Standing Expiration Date:   12/11/2018    Order Specific Question:   Reason for Exam (SYMPTOM  OR DIAGNOSIS REQUIRED)    Answer:   breast cancer screening, patient is s/p breast reduction surgery    Order Specific Question:   Preferred imaging location?    Answer:   [facility]  . B12    Standing Status:   Future    Standing Expiration Date:   10/10/2018  . HgB A1c    Standing Status:   Future    Standing Expiration Date:   10/10/2018  . Comp Met (CMET)    Standing Status:   Future    Standing Expiration Date:   10/10/2018  . Lipid panel    Standing Status:   Future    Standing Expiration Date:   10/10/2018  . Ambulatory referral to General Surgery    Referral Priority:   Routine    Referral Type:   Surgical    Referral Reason:   Specialty Services Required    Requested Specialty:   General Surgery    Number of Visits Requested:   1    No orders of the defined types were placed in this encounter.    Tommi Rumps, MD [place]

## 2017-10-10 NOTE — Patient Instructions (Signed)
Nice to see you. We will have you return for fasting lab work next week. Please call your orthopedist regarding her right knee to see what the next step is. Please  discuss your eating with your therapist.  I do like your idea about trying to avoid going into the store. We will check some lab work for your fingertip numbness.  If this worsens or changes please get evaluated.

## 2017-10-11 DIAGNOSIS — R202 Paresthesia of skin: Secondary | ICD-10-CM | POA: Insufficient documentation

## 2017-10-11 NOTE — Assessment & Plan Note (Signed)
Patient denies depression and anxiety.  She is not feeling a whole lot currently.  I suspect her increased appetite is a coping mechanism for what she is dealing with.  I encouraged her to discuss this with her therapist.

## 2017-10-11 NOTE — Assessment & Plan Note (Signed)
Chronic issue.  I advised that she contact her orthopedist to discuss further treatment.

## 2017-10-11 NOTE — Assessment & Plan Note (Signed)
Isolated to left fingertips.  Neurologically intact today.  Possibly neuropathy related.  Possibly how she is using her hands for knitting.  We will check lab work.  Consider neurology referral.

## 2017-10-11 NOTE — Assessment & Plan Note (Signed)
Greater than 40.  Patient is interested in seeing a bariatric surgeon to discuss treatment.

## 2017-10-15 DIAGNOSIS — J301 Allergic rhinitis due to pollen: Secondary | ICD-10-CM | POA: Diagnosis not present

## 2017-10-17 ENCOUNTER — Other Ambulatory Visit: Payer: Medicare HMO

## 2017-10-17 ENCOUNTER — Other Ambulatory Visit: Payer: Self-pay | Admitting: Family Medicine

## 2017-10-20 NOTE — Telephone Encounter (Signed)
Okay to refill Ambien? Last refilled by provider on 09/19/17 With #30 & no refills (please consider adding refills if approved)  LOV: 10/10/17 NOV: 04/10/18

## 2017-10-20 NOTE — Telephone Encounter (Signed)
Ok to refill. Please call this in to her pharmacy. Thanks.

## 2017-10-20 NOTE — Telephone Encounter (Signed)
Called into pharmacy

## 2017-10-22 DIAGNOSIS — J301 Allergic rhinitis due to pollen: Secondary | ICD-10-CM | POA: Diagnosis not present

## 2017-10-24 ENCOUNTER — Other Ambulatory Visit (INDEPENDENT_AMBULATORY_CARE_PROVIDER_SITE_OTHER): Payer: Medicare HMO

## 2017-10-24 DIAGNOSIS — E785 Hyperlipidemia, unspecified: Secondary | ICD-10-CM | POA: Diagnosis not present

## 2017-10-24 DIAGNOSIS — G629 Polyneuropathy, unspecified: Secondary | ICD-10-CM | POA: Diagnosis not present

## 2017-10-24 LAB — COMPREHENSIVE METABOLIC PANEL
ALBUMIN: 4.3 g/dL (ref 3.5–5.2)
ALT: 15 U/L (ref 0–35)
AST: 17 U/L (ref 0–37)
Alkaline Phosphatase: 51 U/L (ref 39–117)
BUN: 14 mg/dL (ref 6–23)
CALCIUM: 9.2 mg/dL (ref 8.4–10.5)
CHLORIDE: 104 meq/L (ref 96–112)
CO2: 30 mEq/L (ref 19–32)
Creatinine, Ser: 0.9 mg/dL (ref 0.40–1.20)
GFR: 65.75 mL/min (ref >60.00)
Glucose, Bld: 100 mg/dL — ABNORMAL HIGH (ref 70–99)
Potassium: 4.7 mEq/L (ref 3.5–5.1)
Sodium: 142 mEq/L (ref 135–145)
Total Bilirubin: 0.8 mg/dL (ref 0.2–1.2)
Total Protein: 7 g/dL (ref 6.0–8.3)

## 2017-10-24 LAB — LIPID PANEL
CHOLESTEROL: 147 mg/dL (ref 0–200)
HDL: 59.6 mg/dL (ref >39.00)
LDL CALC: 69 mg/dL (ref 0–99)
NonHDL: 87.58
Total CHOL/HDL Ratio: 2
Triglycerides: 95 mg/dL (ref 0.0–149.0)
VLDL: 19 mg/dL (ref 0.0–40.0)

## 2017-10-24 LAB — HEMOGLOBIN A1C: HEMOGLOBIN A1C: 6 % (ref 4.6–6.5)

## 2017-10-24 LAB — VITAMIN B12: VITAMIN B 12: 607 pg/mL (ref 211–911)

## 2017-10-25 NOTE — Progress Notes (Signed)
[facility] [address] [place], [place] [address] Phone: [phone] Subjective:    CC: Knee pain follow-up  [id number]  Crystal Haas is a 72 y.o. female coming in with complaint of knee pain.  Patient does have severe arthritis.  Failed all conservative therapy and was given Visco supplementation 3 months ago.  Patient states that she is in constant pain and that the last injection did not help. Her pain has not decreased since the injection but increased. She also notes that walking around the grocery store yesterday made her knee very painful and she was unable to carry the groceries into the house due to the pain.  Has failed all conservative therapy.      Past Medical History:  Diagnosis Date  . Anemia    distant past  . Anxiety   . Arthritis    "everywhere" - big toes worst  . Atrial fibrillation (HCC)   . Depression   . Dysrhythmia    a-fib  . Heart murmur    mild - followed by PCP  . Knee pain   . Motion sickness    back seat of car  . New onset a-fib (HCC)   . Seasonal allergies    takes allergy weekly  . Sleep apnea    uses CPAP SS - [facility] 2013   Past Surgical History:  Procedure Laterality Date  . BREAST REDUCTION SURGERY Bilateral 08/29/2016   Procedure: BILATERAL MAMMARY REDUCTION  (BREAST)WITH LIPOSUCTION;  Surgeon: Peggye Form, DO;  Location: [facility];  Service: Plastics;  Laterality: Bilateral;  . CATARACT EXTRACTION W/PHACO Left 05/03/2015   Procedure: CATARACT EXTRACTION PHACO AND INTRAOCULAR LENS PLACEMENT (IOC);  Surgeon: Lockie Mola, MD;  Location: [facility];  Service: Ophthalmology;  Laterality: Left;  CPAP  . CATARACT EXTRACTION W/PHACO Right 10/09/2016   Procedure: CATARACT EXTRACTION PHACO AND INTRAOCULAR LENS PLACEMENT (IOC);  Surgeon: Lockie Mola, MD;  Location: [facility];  Service: Ophthalmology;  Laterality: Right;  sleep apnea  . CHONDROPLASTY Right  04/29/2016   Procedure: CHONDROPLASTY;  Surgeon: Donato Heinz, MD;  Location: [facility];  Service: Orthopedics;  Laterality: Right;  . EYE SURGERY    . KNEE ARTHROSCOPY WITH LATERAL MENISECTOMY  04/29/2016   Procedure: KNEE ARTHROSCOPY WITH LATERAL MENISECTOMY;  Surgeon: Donato Heinz, MD;  Location: [facility];  Service: Orthopedics;;  . KNEE ARTHROSCOPY WITH MEDIAL MENISECTOMY  04/29/2016   Procedure: KNEE ARTHROSCOPY WITH MEDIAL MENISECTOMY;  Surgeon: Donato Heinz, MD;  Location: [facility];  Service: Orthopedics;;  . RETINAL DETACHMENT SURGERY Left May 03, 2015   Dr. Inez Pilgrim, [facility]  . TUBAL LIGATION     Social History   Socioeconomic History  . Marital status: Widowed    Spouse name: None  . Number of children: None  . Years of education: None  . Highest education level: None  Social Needs  . Financial resource strain: None  . Food insecurity - worry: None  . Food insecurity - inability: None  . Transportation needs - medical: None  . Transportation needs - non-medical: None  Occupational History  . None  Tobacco Use  . Smoking status: Former Smoker    Packs/day: 0.25    Types: Cigarettes    Last attempt to quit: 10/21/1969    Years since quitting: 48.0  . Smokeless tobacco: Never Used  Substance and Sexual Activity  . Alcohol use: Yes    Alcohol/week: 4.2 oz    Types:  7 Shots of liquor per week    Comment: daily  . Drug use: No  . Sexual activity: None  Other Topics Concern  . None  Social History Narrative  . None   Allergies  Allergen Reactions  . Apple Anaphylaxis    Throat swells but subsides with po benadry  . Daucus Carota Anaphylaxis    Peeling of carrot only but subsides with po benadry  . Ivp Dye [Iodinated Diagnostic Agents] Shortness Of Breath    Also swelling.  Topical betadine is OK.  . Other Swelling    Raw fruits cause throat to swell. Topical contact with carrots cause hands to swell, ok to eat.  . Strawberry (Diagnostic) Anaphylaxis     Throat swells but subsides with benadryl  . Strawberry Extract Anaphylaxis    Throat swells but subsides with benadryl  . Iodine Swelling    IV   . Tape Other (See Comments)    Most tapes case raw skin.  Paper tape is OK.   Family History  Problem Relation Age of Onset  . Alcoholism Unknown   . Heart disease Unknown   . Breast cancer Neg Hx      Past medical history, social, surgical and family history all reviewed in electronic medical record.  No pertanent information unless stated regarding to the chief complaint.   Review of Systems:Review of systems updated and as accurate as of 10/27/17  No headache, visual changes, nausea, vomiting, diarrhea, constipation, dizziness, abdominal pain, skin rash, fevers, chills, night sweats, weight loss, swollen lymph nodes, body aches, joint swelling,  chest pain, shortness of breath, mood changes.  Positive muscle aches  Objective  Blood pressure 132/76, pulse 80, height 5\' 4"  (1.626 m), weight 236 lb (107 kg), SpO2 97 %. Systems examined below as of 10/27/17   General: No apparent distress alert and oriented x3 mood and affect normal, dressed appropriately.  HEENT: Pupils equal, extraocular movements intact  Respiratory: Patient's speak in full sentences and does not appear short of breath  Cardiovascular: No lower extremity edema, non tender, no erythema  Skin: Warm dry intact with no signs of infection or rash on extremities or on axial skeleton.  Abdomen: Soft nontender  Neuro: Cranial nerves II through XII are intact, neurovascularly intact in all extremities with 2+ DTRs and 2+ pulses.  Lymph: No lymphadenopathy of posterior or anterior cervical chain or axillae bilaterally.  Gait antalgic gait MSK:  Non tender with full range of motion and good stability and symmetric strength and tone of shoulders, elbows, wrist, hip, and ankles bilaterally.  Knee: Right valgus deformity noted. Large thigh to calf ratio.  Tender to palpation over  medial and PF joint line.  ROM full in flexion and extension and lower leg rotation. instability with valgus force.  painful patellar compression. Patellar glide with moderate crepitus. Patellar and quadriceps tendons unremarkable. Hamstring and quadriceps strength is normal. Contralateral knee shows mild arthritic changes  After informed written and verbal consent, patient was seated on exam table. Right knee was prepped with alcohol swab and utilizing anterolateral approach, patient's right knee space was injected with 4:1  marcaine 0.5%: Kenalog 40mg /dL. Patient tolerated the procedure well without immediate complications.   Impression and Recommendations:     This case required medical decision making of moderate complexity.      Note: This dictation was prepared with Dragon dictation along with smaller phrase technology. Any transcriptional errors that result from this process are unintentional.

## 2017-10-27 ENCOUNTER — Ambulatory Visit: Payer: Medicare HMO | Admitting: Family Medicine

## 2017-10-27 ENCOUNTER — Encounter: Payer: Self-pay | Admitting: Family Medicine

## 2017-10-27 VITALS — BP 132/76 | HR 80 | Ht 64.0 in | Wt 236.0 lb

## 2017-10-27 DIAGNOSIS — M1711 Unilateral primary osteoarthritis, right knee: Secondary | ICD-10-CM | POA: Diagnosis not present

## 2017-10-27 DIAGNOSIS — J301 Allergic rhinitis due to pollen: Secondary | ICD-10-CM | POA: Diagnosis not present

## 2017-10-27 MED ORDER — GABAPENTIN 100 MG PO CAPS: 200.0000 mg | ORAL_CAPSULE | Freq: Every day | ORAL | 3 refills | Status: DC

## 2017-10-27 NOTE — Assessment & Plan Note (Signed)
Patient at this time is failed all conservative therapy.  Given another injection for some symptomatic relief.  Patient is having pain and it is affecting daily activities and patient's quality of life.  Send to orthopedic surgery to discuss surgical intervention..Marland Kitchen

## 2017-10-27 NOTE — Patient Instructions (Signed)
I am sorry you are hurting We will get you in with Dr. Turner Danielsowan at [facility] and discuss options at this time.  Gave you another injeciton to help  Gabapentin 200mg  at night I am here if you have questions.

## 2017-11-03 DIAGNOSIS — J301 Allergic rhinitis due to pollen: Secondary | ICD-10-CM | POA: Diagnosis not present

## 2017-11-04 ENCOUNTER — Other Ambulatory Visit: Payer: Self-pay | Admitting: Family Medicine

## 2017-11-04 ENCOUNTER — Ambulatory Visit
Admission: RE | Admit: 2017-11-04 | Discharge: 2017-11-04 | Disposition: A | Discharge status: Home / Self Care | Payer: Medicare HMO | Source: Ambulatory Visit | Attending: Family Medicine | Admitting: Family Medicine

## 2017-11-04 DIAGNOSIS — Z1231 Encounter for screening mammogram for malignant neoplasm of breast: Secondary | ICD-10-CM | POA: Insufficient documentation

## 2017-11-04 DIAGNOSIS — Z1239 Encounter for other screening for malignant neoplasm of breast: Secondary | ICD-10-CM

## 2017-11-10 DIAGNOSIS — J301 Allergic rhinitis due to pollen: Secondary | ICD-10-CM | POA: Diagnosis not present

## 2017-11-17 DIAGNOSIS — J301 Allergic rhinitis due to pollen: Secondary | ICD-10-CM | POA: Diagnosis not present

## 2017-11-22 ENCOUNTER — Other Ambulatory Visit: Payer: Self-pay | Admitting: Family Medicine

## 2017-11-23 ENCOUNTER — Other Ambulatory Visit: Payer: Self-pay | Admitting: Family Medicine

## 2017-11-23 DIAGNOSIS — F32A Depression, unspecified: Secondary | ICD-10-CM

## 2017-11-23 DIAGNOSIS — F329 Major depressive disorder, single episode, unspecified: Secondary | ICD-10-CM

## 2017-11-24 DIAGNOSIS — J301 Allergic rhinitis due to pollen: Secondary | ICD-10-CM | POA: Diagnosis not present

## 2017-11-25 ENCOUNTER — Telehealth: Payer: Self-pay | Admitting: Family Medicine

## 2017-11-25 ENCOUNTER — Telehealth: Payer: Self-pay | Admitting: Cardiovascular Disease

## 2017-11-25 DIAGNOSIS — R69 Illness, unspecified: Secondary | ICD-10-CM | POA: Diagnosis not present

## 2017-11-25 DIAGNOSIS — F32A Depression, unspecified: Secondary | ICD-10-CM

## 2017-11-25 DIAGNOSIS — F329 Major depressive disorder, single episode, unspecified: Secondary | ICD-10-CM

## 2017-11-25 MED ORDER — PAROXETINE HCL 20 MG PO TABS: 20.0000 mg | ORAL_TABLET | Freq: Every day | ORAL | 2 refills | Status: DC

## 2017-11-25 MED ORDER — ATORVASTATIN CALCIUM 20 MG PO TABS: ORAL_TABLET | ORAL | 2 refills | Status: DC

## 2017-11-25 MED ORDER — ZOLPIDEM TARTRATE 5 MG PO TABS: 5.0000 mg | ORAL_TABLET | Freq: Every evening | ORAL | 0 refills | Status: DC | PRN

## 2017-11-25 NOTE — Telephone Encounter (Signed)
Pt called back and she would like the lipitor to be called into [facility] for the $24 for 90 days.

## 2017-11-25 NOTE — Telephone Encounter (Signed)
Eliquis is better than warfarin but if she is not able to afford Eliquis, I am fine switching to warfarin.

## 2017-11-25 NOTE — Telephone Encounter (Signed)
Patient is no longer eligible for prescription assistance, she would like to switch to [facility]

## 2017-11-25 NOTE — Telephone Encounter (Signed)
Copied from CRM [id number]. Topic: Quick Communication - See Telephone Encounter >> Nov 25, 2017  8:39 AM Arlyss Gandyichardson, Jett Kulzer N, NT wrote: CRM for notification. See Telephone encounter for: Pt calling and states she needs something prescribed for her cholesterol other than the  atorvastatin (LIPITOR). She needs something less expensive because she is not longer getting medical help with her medications. Uses [facility] in [place].   11/25/17.

## 2017-11-25 NOTE — Telephone Encounter (Signed)
Pt calling stating she can't afford her Eliquis   Would like to know about any other options   Please advise

## 2017-11-25 NOTE — Telephone Encounter (Signed)
Sent to [facility]

## 2017-11-25 NOTE — Telephone Encounter (Signed)
Pt would like you to also check the price for PARoxetine (PAXIL) 20 MG tablet [[id number]][[id number]]  At [facility]

## 2017-11-25 NOTE — Telephone Encounter (Signed)
Left message to return call, atorvastatin (lipitor) is on the $9 list at [facility] for 30 days and $24 for 90 days without insurance, if patient would still like to switch then I will forward to Dr.Sonnenberg. Ok for pec to speak to patient

## 2017-11-25 NOTE — Telephone Encounter (Signed)
Patient taking Eliquis 5mg  for atrial fibrillation. She has lost all prescription medication assistance and needs to change to a more affordable blood thinner. We discussed Xarelto which may be about the same cost as Eliquis. Pt states she needs the cheapest option possible. She is agreeable to coumadin. Will route to MD for approval to discontinue eliquis and start coumadin 5mg  qd then follow up with coumadin clinic 5 days after starting medication.

## 2017-11-26 DIAGNOSIS — R69 Illness, unspecified: Secondary | ICD-10-CM | POA: Diagnosis not present

## 2017-11-27 DIAGNOSIS — G4733 Obstructive sleep apnea (adult) (pediatric): Secondary | ICD-10-CM | POA: Diagnosis not present

## 2017-11-27 NOTE — Telephone Encounter (Signed)
Reviewed with patient. She will call back with her decision after [facility]

## 2017-12-01 DIAGNOSIS — J301 Allergic rhinitis due to pollen: Secondary | ICD-10-CM | POA: Diagnosis not present

## 2017-12-08 ENCOUNTER — Telehealth: Payer: Self-pay | Admitting: Cardiovascular Disease

## 2017-12-08 DIAGNOSIS — J301 Allergic rhinitis due to pollen: Secondary | ICD-10-CM | POA: Diagnosis not present

## 2017-12-08 MED ORDER — APIXABAN 5 MG PO TABS: 5.0000 mg | ORAL_TABLET | Freq: Two times a day (BID) | ORAL | 3 refills | Status: DC

## 2017-12-08 NOTE — Telephone Encounter (Signed)
Refill Request.  

## 2017-12-08 NOTE — Telephone Encounter (Signed)
[facility] to [facility] [address]

## 2017-12-08 NOTE — Telephone Encounter (Signed)
°*  STAT* If patient is at the pharmacy, call can be transferred to refill team.   1. Which medications need to be refilled? (please list name of each medication and dose if known)  Eliquis   2. Which pharmacy/location (including street and city if local pharmacy) is medication to be sent to? [facility] [address]  3. Do they need a 30 day or 90 day supply? 90 day

## 2017-12-15 DIAGNOSIS — J301 Allergic rhinitis due to pollen: Secondary | ICD-10-CM | POA: Diagnosis not present

## 2017-12-22 DIAGNOSIS — J301 Allergic rhinitis due to pollen: Secondary | ICD-10-CM | POA: Diagnosis not present

## 2017-12-29 DIAGNOSIS — J301 Allergic rhinitis due to pollen: Secondary | ICD-10-CM | POA: Diagnosis not present

## 2018-01-02 ENCOUNTER — Ambulatory Visit: Payer: Self-pay

## 2018-01-05 ENCOUNTER — Encounter: Payer: Self-pay | Admitting: Family Medicine

## 2018-01-05 DIAGNOSIS — J301 Allergic rhinitis due to pollen: Secondary | ICD-10-CM | POA: Diagnosis not present

## 2018-01-12 DIAGNOSIS — J301 Allergic rhinitis due to pollen: Secondary | ICD-10-CM | POA: Diagnosis not present

## 2018-01-15 DIAGNOSIS — J301 Allergic rhinitis due to pollen: Secondary | ICD-10-CM | POA: Diagnosis not present

## 2018-01-16 ENCOUNTER — Telehealth: Payer: Self-pay | Admitting: Family Medicine

## 2018-01-16 NOTE — Telephone Encounter (Signed)
Left message for patient to return call no answer just voicemail need more information on knee pain.

## 2018-01-16 NOTE — Telephone Encounter (Signed)
See pt. Request. No answer at pt.'s home.

## 2018-01-16 NOTE — Telephone Encounter (Signed)
Unfortunately she will need to be evaluated given this is an acute change to determine the appropriate therapy. She could go to the walk in clinic for this. Thanks.

## 2018-01-16 NOTE — Telephone Encounter (Signed)
Copied from CRM [id number]. Topic: Quick Communication - Rx Refill/Question >> Jan 16, 2018  2:06 PM Landry MellowFoltz, Melissa J wrote: Medication: something for pain and inflammation  Has the patient contacted their pharmacy? No. (Agent: If no, request that the patient contact the pharmacy for the refill.) Preferred Pharmacy (with phone number [facility] street name): [facility] [address]  Agent: Please be advised that RX refills may take up to 3 business days. We ask that you follow-up with your pharmacy. Pt has appt on 01/29/18. Knee is swollen and painful. Is  asking for something until she can be seen by Dr Ernest PineHooten.

## 2018-01-16 NOTE — Telephone Encounter (Signed)
Patient staed she will go to urgent care.

## 2018-01-16 NOTE — Telephone Encounter (Signed)
Patient reports that she is in need of total R knee replacement- she does have an appointment (01/29/18) to discuss that. This morning she noticed that her knee was twice size it normally is. Patient wakes up at night in pain. Patient is on Eliquis and she knows that she is limited on what she can take.

## 2018-01-19 DIAGNOSIS — J301 Allergic rhinitis due to pollen: Secondary | ICD-10-CM | POA: Diagnosis not present

## 2018-01-26 DIAGNOSIS — J301 Allergic rhinitis due to pollen: Secondary | ICD-10-CM | POA: Diagnosis not present

## 2018-01-29 DIAGNOSIS — Z6841 Body Mass Index (BMI) 40.0 and over, adult: Secondary | ICD-10-CM | POA: Diagnosis not present

## 2018-01-29 DIAGNOSIS — M1711 Unilateral primary osteoarthritis, right knee: Secondary | ICD-10-CM | POA: Diagnosis not present

## 2018-01-31 ENCOUNTER — Telehealth: Payer: Self-pay | Admitting: Family Medicine

## 2018-01-31 NOTE — Telephone Encounter (Signed)
Please contact the patient to see what she has done to attempt to lose weight over the years.  Please list this out.  I need this information to provide a letter to the bariatric surgeon consider surgery for her weight.

## 2018-02-02 DIAGNOSIS — J301 Allergic rhinitis due to pollen: Secondary | ICD-10-CM | POA: Diagnosis not present

## 2018-02-02 NOTE — Telephone Encounter (Signed)
I have created a letter for her.  Please fill in the information sheet that goes with the letter and give it back to me to sign.  Thanks.

## 2018-02-02 NOTE — Telephone Encounter (Signed)
Called patient and she states that she has tried numerous diets in order to loose weight. I asked her if she had tried any medications but all she said is they didn't work I asked specifically for the medications but she was not able to give me that information she has tried the following: [place] Low carb No carb  Paleo

## 2018-02-09 DIAGNOSIS — J301 Allergic rhinitis due to pollen: Secondary | ICD-10-CM | POA: Diagnosis not present

## 2018-02-09 NOTE — Telephone Encounter (Signed)
Given to Dr.Sonnenberg 

## 2018-02-09 NOTE — Telephone Encounter (Signed)
Forms are in you pile of papers.

## 2018-02-10 NOTE — Telephone Encounter (Signed)
faxed

## 2018-02-10 NOTE — Telephone Encounter (Signed)
Form completed.  Please print out her problem list and attached this.  It can then be sent back to the surgeon's office.  Thanks.

## 2018-02-13 ENCOUNTER — Telehealth: Payer: Self-pay | Admitting: Cardiovascular Disease

## 2018-02-13 NOTE — Telephone Encounter (Signed)
Lmov for patient to call back Change in schedule Provider won't be in office 04/03/18   Will need to reschedule appointment  Will try again at a later time

## 2018-02-16 DIAGNOSIS — J301 Allergic rhinitis due to pollen: Secondary | ICD-10-CM | POA: Diagnosis not present

## 2018-02-17 NOTE — Telephone Encounter (Signed)
Pt has been rescheduled. 

## 2018-02-18 DIAGNOSIS — G4733 Obstructive sleep apnea (adult) (pediatric): Secondary | ICD-10-CM | POA: Diagnosis not present

## 2018-02-18 DIAGNOSIS — J301 Allergic rhinitis due to pollen: Secondary | ICD-10-CM | POA: Diagnosis not present

## 2018-02-23 ENCOUNTER — Other Ambulatory Visit: Payer: Self-pay | Admitting: Family Medicine

## 2018-02-23 DIAGNOSIS — J301 Allergic rhinitis due to pollen: Secondary | ICD-10-CM | POA: Diagnosis not present

## 2018-02-24 NOTE — Telephone Encounter (Signed)
Last OV 10/10/17 last filled 11/25/17 [id number]

## 2018-02-26 DIAGNOSIS — G4733 Obstructive sleep apnea (adult) (pediatric): Secondary | ICD-10-CM | POA: Diagnosis not present

## 2018-03-02 DIAGNOSIS — J301 Allergic rhinitis due to pollen: Secondary | ICD-10-CM | POA: Diagnosis not present

## 2018-03-09 ENCOUNTER — Encounter: Payer: Self-pay | Admitting: Physician Assistant

## 2018-03-09 ENCOUNTER — Ambulatory Visit: Payer: Medicare HMO | Admitting: Physician Assistant

## 2018-03-09 VITALS — BP 140/80 | HR 73 | Ht 64.5 in | Wt 241.5 lb

## 2018-03-09 DIAGNOSIS — I482 Chronic atrial fibrillation, unspecified: Secondary | ICD-10-CM

## 2018-03-09 DIAGNOSIS — Z01810 Encounter for preprocedural cardiovascular examination: Secondary | ICD-10-CM | POA: Diagnosis not present

## 2018-03-09 DIAGNOSIS — G4733 Obstructive sleep apnea (adult) (pediatric): Secondary | ICD-10-CM

## 2018-03-09 DIAGNOSIS — Z9989 Dependence on other enabling machines and devices: Secondary | ICD-10-CM

## 2018-03-09 DIAGNOSIS — J301 Allergic rhinitis due to pollen: Secondary | ICD-10-CM | POA: Diagnosis not present

## 2018-03-09 NOTE — Patient Instructions (Signed)
Medication Instructions: - Your physician recommends that you continue on your current medications as directed. Please refer to the Current Medication list given to you today.  Labwork: - none ordered  Procedures/Testing: - none ordered  Follow-Up: - Your physician wants you to follow-up in: 6 months with Dr. Kirke Corin. You will receive a reminder letter in the mail two months in advance. If you don't receive a letter, please call our office to schedule the follow-up appointment.   Any Additional Special Instructions Will Be Listed Below (If Applicable).  - ok to hold eliquis for 2 days prior to surgery and resume 1 day after surgery if the performing physician feels this is safe for you to do so   If you need a refill on your cardiac medications before your next appointment, please call your pharmacy.

## 2018-03-09 NOTE — Progress Notes (Signed)
Cardiology Office Note Date:  03/09/2018  Patient ID:  Crystal Haas, Crystal Haas 1946-04-28, MRN [id number] PCP:  Glori Luis, MD  Cardiologist:  Dr. Kirke Corin, MD    Chief Complaint: Preoperative cardiac evaluation  History of Present Illness: Crystal Haas is a 72 y.o. female with history of chronic Afib on Eliquis, OSA on CPAP, and insomnia who presents for preoperative cardiac evaluation.   TTE from 04/2015 showed an EF of 55-60%, normal wall motion, mild biatrial enlargement. She was last seen by Dr. Kirke Corin in 03/2017 for routine follow up and was doing well. She was tolerating Eliquis without issues.   Most recent labs from 10/2017 show a K+ 4.7, SCr 0.90, glucose 100, A1c 6.0, LDL 69.    She comes in doing well from a cardiac perspective.  She has been dealing with chronic right knee pain for the past approximate 5 years which has progressively been getting worse.  She is scheduled for a right knee arthroplasty on 03/25/2018.  She has not had any symptoms concerning for chest pain, dyspnea, dizziness, presyncope, or syncope.  No recent falls.  No BRBPR or melena.  She is able to achieve greater than 4 METS without issues.  Secondly, she is going to be evaluated for gastric sleeve later this year and is hoping to have this surgery within the next 12 months.  She reports continued compliance with her CPAP.  She is having issues with the seal of her mask.   Past Medical History:  Diagnosis Date  . Anemia    distant past  . Anxiety   . Arthritis    "everywhere" - big toes worst  . Chronic atrial fibrillation (HCC)    a. on eliquis; b. CHADS2VASc at least 2 (age x 1, female)  . Depression   . Heart murmur    mild - followed by PCP  . Knee pain   . Motion sickness    back seat of car  . OSA on CPAP   . Seasonal allergies    takes allergy weekly    Past Surgical History:  Procedure Laterality Date  . BREAST REDUCTION SURGERY Bilateral 08/29/2016   Procedure: BILATERAL MAMMARY REDUCTION   (BREAST)WITH LIPOSUCTION;  Surgeon: Peggye Form, DO;  Location: [facility];  Service: Plastics;  Laterality: Bilateral;  . CATARACT EXTRACTION W/PHACO Left 05/03/2015   Procedure: CATARACT EXTRACTION PHACO AND INTRAOCULAR LENS PLACEMENT (IOC);  Surgeon: Lockie Mola, MD;  Location: [facility];  Service: Ophthalmology;  Laterality: Left;  CPAP  . CATARACT EXTRACTION W/PHACO Right 10/09/2016   Procedure: CATARACT EXTRACTION PHACO AND INTRAOCULAR LENS PLACEMENT (IOC);  Surgeon: Lockie Mola, MD;  Location: [facility];  Service: Ophthalmology;  Laterality: Right;  sleep apnea  . CHONDROPLASTY Right 04/29/2016   Procedure: CHONDROPLASTY;  Surgeon: Donato Heinz, MD;  Location: [facility];  Service: Orthopedics;  Laterality: Right;  . EYE SURGERY    . KNEE ARTHROSCOPY WITH LATERAL MENISECTOMY  04/29/2016   Procedure: KNEE ARTHROSCOPY WITH LATERAL MENISECTOMY;  Surgeon: Donato Heinz, MD;  Location: [facility];  Service: Orthopedics;;  . KNEE ARTHROSCOPY WITH MEDIAL MENISECTOMY  04/29/2016   Procedure: KNEE ARTHROSCOPY WITH MEDIAL MENISECTOMY;  Surgeon: Donato Heinz, MD;  Location: [facility];  Service: Orthopedics;;  . REDUCTION MAMMAPLASTY Bilateral 09/2016  . RETINAL DETACHMENT SURGERY Left May 03, 2015   Dr. Inez Pilgrim, [facility]  . TUBAL LIGATION      Current Meds  Medication Sig  . acetaminophen (TYLENOL) 500  MG tablet Take 1,000 mg by mouth every 6 (six) hours as needed (for pain.).  Marland Kitchen apixaban (ELIQUIS) 5 MG TABS tablet Take 1 tablet (5 mg total) by mouth 2 (two) times daily.  Marland Kitchen atorvastatin (LIPITOR) 20 MG tablet TAKE 1 TABLET(20 MG) BY MOUTH DAILY (Patient taking differently: Take 20 mg by mouth daily. )  . B Complex-C (B-COMPLEX WITH VITAMIN C) tablet Take 1 tablet by mouth daily.  . celecoxib (CELEBREX) 200 MG capsule Take 200 mg by mouth 2 (two) times daily as needed for pain.  . Cholecalciferol (VITAMIN D3) 2000 units TABS Take 2,000 Units  by mouth daily.  . diphenhydrAMINE (BENADRYL) 25 MG tablet Take 25 mg by mouth daily.  Marland Kitchen EPIPEN 2-PAK 0.3 MG/0.3ML SOAJ injection Inject 0.3 mg as directed as directed. AS NEEDED FOR ANAPHYLAXIS   . fluticasone (FLONASE) 50 MCG/ACT nasal spray Place 2 sprays into both nostrils daily.   . Melatonin 10 MG TABS Take 30 mg by mouth at bedtime.  . montelukast (SINGULAIR) 10 MG tablet Take 10 mg by mouth at bedtime.  . Multiple Vitamin (MULTIVITAMIN WITH MINERALS) TABS tablet Take 1 tablet by mouth daily. One-A-Day Active 65+  . NON FORMULARY 10 each by Other route daily. GIN SOAKED RAISINS FOR PAIN RELIEF  . Olopatadine HCl (PATADAY) 0.2 % SOLN Place 1 drop into both eyes daily.  Marland Kitchen PARoxetine (PAXIL) 20 MG tablet Take 1 tablet (20 mg total) by mouth daily.  Marland Kitchen zolpidem (AMBIEN) 5 MG tablet TAKE 1 TABLET BY MOUTH AT BEDTIME AS NEEDED FOR SLEEP (Patient taking differently: TAKE 1 TABLET BY MOUTH AT BEDTIME)    Allergies:   Apple; Daucus carota; Ivp dye [iodinated diagnostic agents]; Other; Strawberry (diagnostic); Strawberry extract; Iodine; and Tape   Social History:  The patient  reports that she quit smoking about 48 years ago. Her smoking use included cigarettes. She smoked 0.25 packs per day. She has never used smokeless tobacco. She reports that she drinks about 4.2 oz of alcohol per week. She reports that she does not use drugs.   Family History:  The patient's family history includes Alcoholism in her unknown relative; Heart disease in her unknown relative.  ROS:   Review of Systems  Constitutional: Negative for chills, diaphoresis, fever, malaise/fatigue and weight loss.  HENT: Negative for congestion.   Eyes: Negative for discharge and redness.  Respiratory: Negative for cough, hemoptysis, sputum production, shortness of breath and wheezing.   Cardiovascular: Negative for chest pain, palpitations, orthopnea, claudication, leg swelling and PND.  Gastrointestinal: Negative for abdominal  pain, blood in stool, heartburn, melena, nausea and vomiting.  Genitourinary: Negative for hematuria.  Musculoskeletal: Positive for joint pain. Negative for falls and myalgias.  Skin: Negative for rash.  Neurological: Negative for dizziness, tingling, tremors, sensory change, speech change, focal weakness, loss of consciousness and weakness.  Endo/Heme/Allergies: Does not bruise/bleed easily.  Psychiatric/Behavioral: Negative for substance abuse. The patient is not nervous/anxious.   All other systems reviewed and are negative.    PHYSICAL EXAM:  VS:  BP 140/80 (BP Location: Left Arm, Patient Position: Sitting, Cuff Size: Large)   Pulse 73   Ht 5' 4.5" (1.638 m)   Wt 241 lb 8 oz (109.5 kg)   BMI 40.81 kg/m  BMI: Body mass index is 40.81 kg/m.  Physical Exam  Constitutional: She is oriented to person, place, and time. She appears well-developed and well-nourished.  HENT:  Head: Normocephalic and atraumatic.  Eyes: Right eye exhibits no discharge. Left eye  exhibits no discharge.  Neck: Normal range of motion. No JVD present.  Cardiovascular: Normal rate, S1 normal, S2 normal and normal heart sounds. An irregularly irregular rhythm present. Exam reveals no distant heart sounds, no friction rub, no midsystolic click and no opening snap.  No murmur heard. Pulses:      Posterior tibial pulses are 2+ on the right side, and 2+ on the left side.  Pulmonary/Chest: Effort normal and breath sounds normal. No respiratory distress. She has no decreased breath sounds. She has no wheezes. She has no rales. She exhibits no tenderness.  Abdominal: Soft. She exhibits no distension. There is no tenderness.  Musculoskeletal: She exhibits no edema.  Neurological: She is alert and oriented to person, place, and time.  Skin: Skin is warm and dry. No cyanosis. Nails show no clubbing.  Psychiatric: She has a normal mood and affect. Her speech is normal and behavior is normal. Judgment and thought content  normal.     EKG:  Was ordered and interpreted by me today. Shows A. fib, 73 bpm, no acute ST-T changes  Recent Labs: 04/03/2017: Hemoglobin 13.4; Platelets 186 10/24/2017: ALT 15; BUN 14; Creatinine, Ser 0.90; Potassium 4.7; Sodium 142  10/24/2017: Cholesterol 147; HDL 59.60; LDL Cholesterol 69; Total CHOL/HDL Ratio 2; Triglycerides 95.0; VLDL 19.0   CrCl cannot be calculated (Patient's most recent lab result is older than the maximum 21 days allowed.).   Wt Readings from Last 3 Encounters:  03/09/18 241 lb 8 oz (109.5 kg)  10/27/17 236 lb (107 kg)  10/10/17 238 lb (108 kg)     Other studies reviewed: Additional studies/records reviewed today include: summarized above  ASSESSMENT AND PLAN:  1. Chronic A. Fib: Ventricular rates remain well controlled not requiring rate limiting medications.  Continue Eliquis 5 mg twice daily.  Recent labs as above.  She will hold Eliquis for 2 days prior to her right knee arthroplasty with resumption ideally being 24 hours after her surgery however this will be deferred to the physician performing her surgery.  2. Preoperative cardiac evaluation: She is able to achieve greater than 4 metabolic equivalents without issue.  She is low risk for a low risk knee arthroplasty.  However, she will need a nuclear stress test prior to any potential bariatric surgery.  She will call when she is ready to have this scheduled.  3. OSA: Reports issues with the seal of her CPAP mask.  Advised patient to bring this to the medical supply office for further evaluation.  Perhaps she could discuss this further with the prescribing provider.  Disposition: F/u with Dr. Kirke Corin in 6 months.  Current medicines are reviewed at length with the patient today.  The patient did not have any concerns regarding medicines.  Signed, Eula Listen, PA-C 03/09/2018 2:38 PM     [facility] [place] [address] [place], [place] [address] [phone]

## 2018-03-10 ENCOUNTER — Telehealth: Payer: Self-pay | Admitting: Physician Assistant

## 2018-03-10 NOTE — Telephone Encounter (Signed)
Please route to pharmacist for review/input as well. Eliquis is typically only held for 2 days for a knee procedure.

## 2018-03-10 NOTE — Telephone Encounter (Signed)
To Ryan to review. 

## 2018-03-10 NOTE — Telephone Encounter (Signed)
Pt calling stating she was seen here yesterday by Eula Listen and we advised her to be off her Eliquis 2 days prior to her knee surgery She spoke to Dr Elenor Legato office and they are needing her to be off it for 5 day  Would like some advise on this  Please call back

## 2018-03-10 NOTE — Telephone Encounter (Signed)
Agree, 5 day hold should not be necessary for Eliquis since it is cleared within 24 hours. The longest we typically hold is 3 days prior in anticipation of any spinal anesthesia that may be used. Ok for pt to hold Eliquis up to 3 days prior to TKA (on Eliquis for afib with CHADS2VASc score of 2 due to age and sex).

## 2018-03-10 NOTE — Telephone Encounter (Signed)
Ryan,  Are you ok with a 3 day hold pre-procedure?

## 2018-03-10 NOTE — Telephone Encounter (Signed)
To pharmacy to review. 

## 2018-03-11 ENCOUNTER — Telehealth: Payer: Self-pay | Admitting: Physician Assistant

## 2018-03-11 ENCOUNTER — Encounter
Admission: RE | Admit: 2018-03-11 | Discharge: 2018-03-11 | Disposition: A | Discharge status: Home / Self Care | Payer: Medicare HMO | Source: Ambulatory Visit | Attending: Orthopedic Surgery | Admitting: Orthopedic Surgery

## 2018-03-11 ENCOUNTER — Other Ambulatory Visit: Payer: Self-pay

## 2018-03-11 DIAGNOSIS — Z01812 Encounter for preprocedural laboratory examination: Secondary | ICD-10-CM | POA: Diagnosis present

## 2018-03-11 DIAGNOSIS — I482 Chronic atrial fibrillation: Secondary | ICD-10-CM | POA: Insufficient documentation

## 2018-03-11 DIAGNOSIS — G8929 Other chronic pain: Secondary | ICD-10-CM | POA: Diagnosis not present

## 2018-03-11 DIAGNOSIS — M25561 Pain in right knee: Secondary | ICD-10-CM | POA: Diagnosis not present

## 2018-03-11 DIAGNOSIS — G4700 Insomnia, unspecified: Secondary | ICD-10-CM | POA: Diagnosis not present

## 2018-03-11 DIAGNOSIS — G4733 Obstructive sleep apnea (adult) (pediatric): Secondary | ICD-10-CM | POA: Diagnosis not present

## 2018-03-11 LAB — CBC
HEMATOCRIT: 38.8 % (ref 35.0–47.0)
Hemoglobin: 13 g/dL (ref 12.0–16.0)
MCH: 31.5 pg (ref 26.0–34.0)
MCHC: 33.4 g/dL (ref 32.0–36.0)
MCV: 94.1 fL (ref 80.0–100.0)
Platelets: 189 10*3/uL (ref 150–440)
RBC: 4.13 MIL/uL (ref 3.80–5.20)
RDW: 13.7 % (ref 11.5–14.5)
WBC: 7.4 10*3/uL (ref 3.6–11.0)

## 2018-03-11 LAB — URINALYSIS, ROUTINE W REFLEX MICROSCOPIC
Bilirubin Urine: NEGATIVE
Glucose, UA: NEGATIVE mg/dL
Hgb urine dipstick: NEGATIVE
Ketones, ur: NEGATIVE mg/dL
LEUKOCYTES UA: NEGATIVE
NITRITE: NEGATIVE
PH: 7 (ref 5.0–8.0)
Protein, ur: NEGATIVE mg/dL
SPECIFIC GRAVITY, URINE: 1.004 — AB (ref 1.005–1.030)

## 2018-03-11 LAB — C-REACTIVE PROTEIN: CRP: <0.8 mg/dL (ref <1.0)

## 2018-03-11 LAB — COMPREHENSIVE METABOLIC PANEL
ALBUMIN: 4 g/dL (ref 3.5–5.0)
ALT: 21 U/L (ref 14–54)
AST: 20 U/L (ref 15–41)
Alkaline Phosphatase: 51 U/L (ref 38–126)
Anion gap: 5 (ref 5–15)
BILIRUBIN TOTAL: 0.9 mg/dL (ref 0.3–1.2)
BUN: 15 mg/dL (ref 6–20)
CO2: 29 mmol/L (ref 22–32)
CREATININE: 0.85 mg/dL (ref 0.44–1.00)
Calcium: 8.8 mg/dL — ABNORMAL LOW (ref 8.9–10.3)
Chloride: 107 mmol/L (ref 101–111)
GFR calc non Af Amer: >60 mL/min (ref >60)
Glucose, Bld: 106 mg/dL — ABNORMAL HIGH (ref 65–99)
POTASSIUM: 4.1 mmol/L (ref 3.5–5.1)
SODIUM: 141 mmol/L (ref 135–145)
TOTAL PROTEIN: 6.8 g/dL (ref 6.5–8.1)

## 2018-03-11 LAB — SURGICAL PCR SCREEN
MRSA, PCR: NEGATIVE
STAPHYLOCOCCUS AUREUS: NEGATIVE

## 2018-03-11 LAB — TYPE AND SCREEN
ABO/RH(D): A POS
Antibody Screen: NEGATIVE

## 2018-03-11 LAB — SEDIMENTATION RATE: SED RATE: 10 mm/h (ref 0–30)

## 2018-03-11 LAB — PROTIME-INR
INR: 1.17
PROTHROMBIN TIME: 14.8 s (ref 11.4–15.2)

## 2018-03-11 LAB — APTT: APTT: 31 s (ref 24–36)

## 2018-03-11 NOTE — Telephone Encounter (Signed)
3 day hold is ok if required by the MD performing the procedure. Recommend resuming Eliquis as soon as safely possible per MD performing the procedure.

## 2018-03-11 NOTE — Telephone Encounter (Signed)
Patient verbalized understanding to hold Eliquis for 3 days and if Dr Elenor Legato office has any further questions to give [place] a call. She was very Adult nurse.

## 2018-03-11 NOTE — Telephone Encounter (Signed)
No answer. Left message to call back.   

## 2018-03-11 NOTE — Patient Instructions (Signed)
Your procedure is scheduled on: 03-25-18 [facility] Report to Same Day Surgery 2nd floor medical mall [facility] Entrance-take elevator on left to 2nd floor.  Check in with surgery information desk.) To find out your arrival time please call [phone] between 1PM - 3PM on 03-24-18 TUESDAY  Remember: Instructions that are not followed completely may result in serious medical risk, up to and including death, or upon the discretion of your surgeon and anesthesiologist your surgery may need to be rescheduled.    _x___ 1. Do not eat food after midnight the night before your procedure. NO GUM OR CANDY AFTER MIDNIGHT.  You may drink clear liquids up to 2 hours before you are scheduled to arrive at the hospital for your procedure.  Do not drink clear liquids within 2 hours of your scheduled arrival to the hospital.  Clear liquids include  --Water or Apple juice without pulp  --Clear carbohydrate beverage such as ClearFast or Gatorade  --Black Coffee or Clear Tea (No milk, no creamers, do not add anything to  the coffee or Tea     __x__ 2. No Alcohol for 24 hours before or after surgery.   __x__3. No Smoking or e-cigarettes for 24 prior to surgery.  Do not use any chewable tobacco products for at least 6 hour prior to surgery   ____  4. Bring all medications with you on the day of surgery if instructed.    __x__ 5. Notify your doctor if there is any change in your medical condition     (cold, fever, infections).    x___6. On the morning of surgery brush your teeth with toothpaste and water.  You may rinse your mouth with mouth wash if you wish.  Do not swallow any toothpaste or mouthwash.   Do not wear jewelry, make-up, hairpins, clips or nail polish.  Do not wear lotions, powders, or perfumes. You may wear deodorant.  Do not shave 48 hours prior to surgery. Men may shave face and neck.  Do not bring valuables to the hospital.    [facility] is not responsible for any belongings or  valuables.               Contacts, dentures or bridgework may not be worn into surgery.  Leave your suitcase in the car. After surgery it may be brought to your room.  For patients admitted to the hospital, discharge time is determined by your treatment team.  _  Patients discharged the day of surgery will not be allowed to drive home.  You will need someone to drive you home and stay with you the night of your procedure.    Please read over the following fact sheets that you were given:   [facility] Preparing for Surgery and or MRSA Information   _x___ TAKE THE FOLLOWING MEDICATION THE MORNING OF SURGERY WITH A SMALL SIP OF WATER. These include:  1. LIPITOR (ATORVASTATIN)   2. PAXIL (PAROXETINE)  3.  4.  5.  6.  ____Fleets enema or Magnesium Citrate as directed.   _x___ Use CHG Soap or sage wipes as directed on instruction sheet   ____ Use inhalers on the day of surgery and bring to hospital day of surgery  ____ Stop Metformin and Janumet 2 days prior to surgery.    ____ Take 1/2 of usual insulin dose the night before surgery and none on the morning surgery.   _x___ Follow recommendations from Cardiologist, Pulmonologist or PCP regarding stopping Aspirin, Coumadin, Plavix ,  Eliquis, Effient, or Pradaxa, and Pletal-PT INSTRUCTED BY CARDIOLOGIST TO STOP ELOQUIS 3 DAYS PRIOR TO SURGERY  X____Stop Anti-inflammatories such as Advil, Aleve, Ibuprofen, Motrin, Naproxen, Naprosyn, Goodies powders or aspirin products 7 DAYS PRIOR TO SURGERY-OK to take Tylenol OR CONTINUE CELEBREX IF NEEDED   _x___ Stop supplements until after surgery-STOP MELATONIN AND PAPAYA CHEWS 7 DAYS PRIOR TO SURGERY   _X___ Bring C-Pap to the hospital.

## 2018-03-11 NOTE — Telephone Encounter (Signed)
Pt came in the office for someone to review her medications. Please call to discuss.

## 2018-03-12 LAB — URINE CULTURE
Culture: NO GROWTH
Special Requests: NORMAL

## 2018-03-12 NOTE — Telephone Encounter (Signed)
I called and spoke with the patient. She states she came to the wrong office- she meant to go to Dr. Elenor Legato office.  She has no further questions at this time.

## 2018-03-13 DIAGNOSIS — G4733 Obstructive sleep apnea (adult) (pediatric): Secondary | ICD-10-CM | POA: Diagnosis not present

## 2018-03-17 DIAGNOSIS — G4733 Obstructive sleep apnea (adult) (pediatric): Secondary | ICD-10-CM | POA: Diagnosis not present

## 2018-03-17 DIAGNOSIS — J301 Allergic rhinitis due to pollen: Secondary | ICD-10-CM | POA: Diagnosis not present

## 2018-03-17 DIAGNOSIS — Z7902 Long term (current) use of antithrombotics/antiplatelets: Secondary | ICD-10-CM | POA: Diagnosis not present

## 2018-03-17 DIAGNOSIS — R7303 Prediabetes: Secondary | ICD-10-CM | POA: Diagnosis not present

## 2018-03-17 DIAGNOSIS — M170 Bilateral primary osteoarthritis of knee: Secondary | ICD-10-CM | POA: Diagnosis not present

## 2018-03-17 DIAGNOSIS — I482 Chronic atrial fibrillation: Secondary | ICD-10-CM | POA: Diagnosis not present

## 2018-03-17 DIAGNOSIS — Z9989 Dependence on other enabling machines and devices: Secondary | ICD-10-CM | POA: Diagnosis not present

## 2018-03-19 ENCOUNTER — Other Ambulatory Visit: Payer: Self-pay | Admitting: General Surgery

## 2018-03-19 ENCOUNTER — Other Ambulatory Visit (HOSPITAL_COMMUNITY): Payer: Self-pay | Admitting: General Surgery

## 2018-03-23 DIAGNOSIS — J301 Allergic rhinitis due to pollen: Secondary | ICD-10-CM | POA: Diagnosis not present

## 2018-03-24 ENCOUNTER — Encounter: Payer: Self-pay | Admitting: Orthopedic Surgery

## 2018-03-24 MED ORDER — SODIUM CHLORIDE 0.9 % IV SOLN
1000.0000 mg | INTRAVENOUS | Status: DC
  Filled 2018-03-24: qty 10

## 2018-03-24 MED ORDER — CEFAZOLIN SODIUM-DEXTROSE 2-4 GM/100ML-% IV SOLN: 2.0000 g | INTRAVENOUS | Status: DC

## 2018-03-24 NOTE — Discharge Instructions (Signed)
°  Instructions after Total Knee Replacement ° ° Crystal Haas, Jr., M.D.    ° Dept. of Orthopaedics & Sports Medicine ° [facility] ° [address] ° [place], [place]  [address] ° Phone: [phone]   Fax: [phone] ° °  °DIET: °• Drink plenty of non-alcoholic fluids. °• Resume your normal diet. Include foods high in fiber. ° °ACTIVITY:  °• You may use crutches or a walker with weight-bearing as tolerated, unless instructed otherwise. °• You may be weaned off of the walker or crutches by your Physical Therapist.  °• Do NOT place pillows under the knee. Anything placed under the knee could limit your ability to straighten the knee.   °• Continue doing gentle exercises. Exercising will reduce the pain and swelling, increase motion, and prevent muscle weakness.   °• Please continue to use the TED compression stockings for 6 weeks. You may remove the stockings at night, but should reapply them in the morning. °• Do not drive or operate any equipment until instructed. ° °WOUND CARE:  °• Continue to use the PolarCare or ice packs periodically to reduce pain and swelling. °• You may bathe or shower after the staples are removed at the first office visit following surgery. ° °MEDICATIONS: °• You may resume your regular medications. °• Please take the pain medication as prescribed on the medication. °• Do not take pain medication on an empty stomach. °• You have been given a prescription for a blood thinner (Lovenox or Coumadin). Please take the medication as instructed. (NOTE: After completing a 2 week course of Lovenox, take one Enteric-coated aspirin once a day. This along with elevation will help reduce the possibility of phlebitis in your operated leg.) °• Do not drive or drink alcoholic beverages when taking pain medications. ° °CALL THE OFFICE FOR: °• Temperature above 101 degrees °• Excessive bleeding or drainage on the dressing. °• Excessive swelling, coldness, or paleness of the toes. °• Persistent  nausea and vomiting. ° °FOLLOW-UP:  °• You should have an appointment to return to the office in 10-14 days after surgery. °• Arrangements have been made for continuation of Physical Therapy (either home therapy or outpatient therapy). °  °

## 2018-03-24 NOTE — Pre-Procedure Instructions (Signed)
Crystal LollDiane Brendlinger  ECHO COMPLETE WO IMAGING ENHANCING AGENT  Order# [id number]  Reading physician: Iran OuchArida, Muhammad A, MD Ordering physician: Iran OuchArida, Muhammad A, MD Study date: 05/11/15  Result Notes for Echocardiogram   Notes Recorded by Shon BatonSharon H Yow, RN on 05/15/2015 at 8:32 AM Reviewed results with pt and confirmed August appt. ------  Notes Recorded by Iran OuchMuhammad A Arida, MD on 05/13/2015 at 4:02 PM Inform patient that echo was fine. EF is normal. Both atria are mildly dilated likely due to A. fib. Keep follow-up appointment.      Study Result   Result status: Final result                           [facility]*                  [address]                        [place], [place] [address]                            [phone]  ------------------------------------------------------------------- Transthoracic Echocardiography  Patient:    Crystal LollGeary, Crystal MR #:       [id number] Study Date: 05/11/2015 Gender:     F Age:        [age] Height:     165.1 cm Weight:     99.8 kg BSA:        2.18 m^2 Pt. Status: Room:   ATTENDING    Default, Provider [phone]  Lessie DingsDERING     Muhammad Arida, MD  REFERRING    Lorine BearsMuhammad Arida, MD  PERFORMING   [place], [place]  SONOGRAPHER  Quentin OreGary Joseph, RVT, RDCS, RDMS  cc:  ------------------------------------------------------------------- LV EF: 55% -   60%  ------------------------------------------------------------------- History:   PMH:   Murmur.  Atrial fibrillation.  Risk factors: Obstr sleep apnea. Former tobacco use. Obese.  ------------------------------------------------------------------- Study Conclusions  - Left ventricle: The cavity size was normal. Wall thickness was   normal. Systolic function was normal. The estimated ejection   fraction was in the range of 55% to 60%. Wall motion was normal;   there were no regional wall motion abnormalities. - Left atrium: The atrium was mildly dilated. - Right atrium:  The atrium was mildly dilated.  Transthoracic echocardiography.  M-mode, complete 2D, spectral Doppler, and color Doppler.  Birthdate:  Patient birthdate: Jan 06, 1946.  Age:  Patient is 72 yr old.  Sex:  Gender: female. BMI: 36.6 kg/m^2.  Blood pressure:     148/88  Patient status: Outpatient.  Study date:  Study date: 05/11/2015. Study time: 11:26 AM.  -------------------------------------------------------------------  ------------------------------------------------------------------- Left ventricle:  The cavity size was normal. Wall thickness was normal. Systolic function was normal. The estimated ejection fraction was in the range of 55% to 60%. Wall motion was normal; there were no regional wall motion abnormalities. The study was not technically sufficient to allow evaluation of LV diastolic dysfunction due to atrial fibrillation.  ------------------------------------------------------------------- Aortic valve:   Trileaflet; normal thickness leaflets. Mobility was not restricted.  Doppler:  Transvalvular velocity was within the normal range. There was no stenosis. There was no regurgitation.   ------------------------------------------------------------------- Aorta:  Aortic root: The aortic root was normal in size.  ------------------------------------------------------------------- Mitral valve:   Structurally normal valve.   Mobility was not restricted.  Doppler:  Transvalvular velocity was within  the normal range. There was no evidence for stenosis. There was trivial regurgitation.  ------------------------------------------------------------------- Left atrium:  The atrium was mildly dilated.  ------------------------------------------------------------------- Right ventricle:  The cavity size was normal. Wall thickness was normal. Systolic function was normal.  ------------------------------------------------------------------- Pulmonic valve:    Doppler:   Transvalvular velocity was within the normal range. There was no evidence for stenosis.  ------------------------------------------------------------------- Tricuspid valve:   Structurally normal valve.    Doppler: Transvalvular velocity was within the normal range. There was trivial regurgitation.  ------------------------------------------------------------------- Pulmonary artery:   The main pulmonary artery was normal-sized. Systolic pressure could not be accurately estimated.  ------------------------------------------------------------------- Right atrium:  The atrium was mildly dilated.  ------------------------------------------------------------------- Pericardium:  There was no pericardial effusion.  ------------------------------------------------------------------- Systemic veins: Inferior vena cava: The vessel was normal in size.  ------------------------------------------------------------------- Measurements   Left ventricle                         Value        Reference  LV ID, ED, PLAX chordal                43.6  mm     43 - 52  LV PW thickness, ED                    9.88  mm     ---------  IVS/LV PW ratio, ED                    1.02         <=1.3  Stroke volume, 2D                      42    ml     ---------  Stroke volume/bsa, 2D                  19    ml/m^2 ---------    Ventricular septum                     Value        Reference  IVS thickness, ED                      10.1  mm     ---------    LVOT                                   Value        Reference  LVOT ID, S                             18    mm     ---------  LVOT area                              2.54  cm^2   ---------  LVOT ID                                18    mm     ---------  LVOT peak velocity, S                  79.4  cm/s   ---------  LVOT mean velocity, S                  55.3  cm/s   ---------  LVOT VTI, S                            16.5  cm     ---------  LVOT peak  gradient, S                  3     mm Hg  ---------  Stroke volume (SV), LVOT DP            42    ml     ---------  Stroke index (SV/bsa), LVOT DP         19.2  ml/m^2 ---------    Aorta                                  Value        Reference  Aortic root ID, ED                     23    mm     ---------  Ascending aorta ID, A-P, S             24    mm     ---------    Left atrium                            Value        Reference  LA ID, A-P, ES                         44    mm     ---------  LA ID/bsa, A-P                         2.01  cm/m^2 <=2.2  LA volume, S                           82    ml     ---------  LA volume/bsa, S                       37.5  ml/m^2 ---------  LA volume, ES, 1-p A4C                 76    ml     ---------  LA volume/bsa, ES, 1-p A4C             34.8  ml/m^2 ---------  LA volume, ES, 1-p A2C                 88    ml     ---------  LA volume/bsa, ES, 1-p A2C             40.3  ml/m^2 ---------  Legend: (L)  and  (H)  mark values outside specified reference range.  ------------------------------------------------------------------- Prepared and Electronically Authenticated by  Lorine Bears, MD 2016-07-23T14:46:18  [facility] Images   Show images for Echocardiogram  Patient Information   Patient Name Crystal, Haas Sex Female DOB 04/28/1946 SSN [id number]  Reason for  Exam  Priority: Routine  Atrial Fibrillation 427.31 / I48.91  Dx: New onset a-fib [I48.91 (ICD-10-CM)]  Surgical History   Surgical History   No past medical history on file.    Other Surgical History   Procedure Laterality Date Comment Source  BREAST REDUCTION SURGERY Bilateral 08/29/2016 Procedure: BILATERAL MAMMARY REDUCTION (BREAST)WITH LIPOSUCTION; Surgeon: Peggye Form, DO; Location: [facility]; Service: Plastics; Laterality: Bilateral; Provider  CATARACT EXTRACTION W/PHACO Left 05/03/2015 Procedure: CATARACT EXTRACTION PHACO AND INTRAOCULAR LENS  PLACEMENT (IOC); Surgeon: Lockie Mola, MD; Location: [facility]; Service: Ophthalmology; Laterality: Left; CPAP Provider  CATARACT EXTRACTION W/PHACO Right 10/09/2016 Procedure: CATARACT EXTRACTION PHACO AND INTRAOCULAR LENS PLACEMENT (IOC); Surgeon: Lockie Mola, MD; Location: [facility]; Service: Ophthalmology; Laterality: Right; sleep apnea Provider  CHONDROPLASTY Right 04/29/2016 Procedure: CHONDROPLASTY; Surgeon: Donato Heinz, MD; Location: [facility]; Service: Orthopedics; Laterality: Right; Provider  EYE SURGERY    Provider  KNEE ARTHROSCOPY WITH LATERAL MENISECTOMY  04/29/2016 Procedure: KNEE ARTHROSCOPY WITH LATERAL MENISECTOMY; Surgeon: Donato Heinz, MD; Location: [facility]; Service: Orthopedics;; Provider  KNEE ARTHROSCOPY WITH MEDIAL MENISECTOMY  04/29/2016 Procedure: KNEE ARTHROSCOPY WITH MEDIAL MENISECTOMY; Surgeon: Donato Heinz, MD; Location: [facility]; Service: Orthopedics;; Provider  REDUCTION MAMMAPLASTY Bilateral 09/2016  Provider  RETINAL DETACHMENT SURGERY Left May 03, 2015 Dr. Inez Pilgrim, [facility] Provider  TUBAL LIGATION    Provider    Performing Technologist/Nurse   Performing Technologist/Nurse: Quentin Ore T                    Implants    No active implants to display in this view.  Order-Level Documents:   There are no order-level documents.  Encounter-Level Documents - 05/11/2015:   Electronic signature on 05/11/2015 11:18 AM - Signed      Signed   Electronically signed by Iran Ouch, MD on 05/13/15 at 1446 EDT  Printable Result Report   Result Report   External Result Report   External Result Report

## 2018-03-24 NOTE — Pre-Procedure Instructions (Signed)
Sondra Barges, PA-C  Physician Assistant Certified  Cardiology  Progress Notes    Signed  Encounter Date:  03/09/2018          Signed      Expand All Collapse All       Show:Clear all [x] Manual[x] Template[] Copied  Added by: [x] Sondra Barges, PA-C   [] Hover for details      Cardiology Office Note Date:  03/09/2018  Patient ID:  Crystal Haas, Crystal Haas Jul 19, 1946, MRN [id number] PCP:  Glori Luis, MD           Cardiologist:  Dr. Kirke Corin, MD    Chief Complaint: Preoperative cardiac evaluation  History of Present Illness: Crystal Haas is a 72 y.o. female with history of chronic Afib on Eliquis, OSA on CPAP, and insomnia who presents for preoperative cardiac evaluation.   TTE from 04/2015 showed an EF of 55-60%, normal wall motion, mild biatrial enlargement. She was last seen by Dr. Kirke Corin in 03/2017 for routine follow up and was doing well. She was tolerating Eliquis without issues.   Most recent labs from 10/2017 show a K+ 4.7, SCr 0.90, glucose 100, A1c 6.0, LDL 69.    She comes in doing well from a cardiac perspective.  She has been dealing with chronic right knee pain for the past approximate 5 years which has progressively been getting worse.  She is scheduled for a right knee arthroplasty on 03/25/2018.  She has not had any symptoms concerning for chest pain, dyspnea, dizziness, presyncope, or syncope.  No recent falls.  No BRBPR or melena.  She is able to achieve greater than 4 METS without issues.  Secondly, she is going to be evaluated for gastric sleeve later this year and is hoping to have this surgery within the next 12 months.  She reports continued compliance with her CPAP.  She is having issues with the seal of her mask.   Past Medical History:  Diagnosis Date  . Anemia    distant past  . Anxiety   . Arthritis    "everywhere" - big toes worst  . Chronic atrial fibrillation (HCC)    a. on eliquis; b. CHADS2VASc at least 2 (age x 1, female)  .  Depression   . Heart murmur    mild - followed by PCP  . Knee pain   . Motion sickness    back seat of car  . OSA on CPAP   . Seasonal allergies    takes allergy weekly         Past Surgical History:  Procedure Laterality Date  . BREAST REDUCTION SURGERY Bilateral 08/29/2016   Procedure: BILATERAL MAMMARY REDUCTION  (BREAST)WITH LIPOSUCTION;  Surgeon: Peggye Form, DO;  Location: [facility];  Service: Plastics;  Laterality: Bilateral;  . CATARACT EXTRACTION W/PHACO Left 05/03/2015   Procedure: CATARACT EXTRACTION PHACO AND INTRAOCULAR LENS PLACEMENT (IOC);  Surgeon: Lockie Mola, MD;  Location: [facility];  Service: Ophthalmology;  Laterality: Left;  CPAP  . CATARACT EXTRACTION W/PHACO Right 10/09/2016   Procedure: CATARACT EXTRACTION PHACO AND INTRAOCULAR LENS PLACEMENT (IOC);  Surgeon: Lockie Mola, MD;  Location: [facility];  Service: Ophthalmology;  Laterality: Right;  sleep apnea  . CHONDROPLASTY Right 04/29/2016   Procedure: CHONDROPLASTY;  Surgeon: Donato Heinz, MD;  Location: [facility];  Service: Orthopedics;  Laterality: Right;  . EYE SURGERY    . KNEE ARTHROSCOPY WITH LATERAL MENISECTOMY  04/29/2016   Procedure: KNEE ARTHROSCOPY WITH LATERAL MENISECTOMY;  Surgeon:  Donato HeinzJames P Hooten, MD;  Location: [facility];  Service: Orthopedics;;  . KNEE ARTHROSCOPY WITH MEDIAL MENISECTOMY  04/29/2016   Procedure: KNEE ARTHROSCOPY WITH MEDIAL MENISECTOMY;  Surgeon: Donato HeinzJames P Hooten, MD;  Location: [facility];  Service: Orthopedics;;  . REDUCTION MAMMAPLASTY Bilateral 09/2016  . RETINAL DETACHMENT SURGERY Left May 03, 2015   Dr. Inez Pil[facility]imBrasington, [facility]  . TUBAL LIGATION      ActiveMedications      Current Meds  Medication Sig  . acetaminophen (TYLENOL) 500 MG tablet Take 1,000 mg by mouth every 6 (six) hours as needed (for pain.).  Marland Kitchen. apixaban (ELIQUIS) 5 MG TABS tablet Take 1 tablet (5 mg total) by mouth 2 (two) times  daily.  Marland Kitchen. atorvastatin (LIPITOR) 20 MG tablet TAKE 1 TABLET(20 MG) BY MOUTH DAILY (Patient taking differently: Take 20 mg by mouth daily. )  . B Complex-C (B-COMPLEX WITH VITAMIN C) tablet Take 1 tablet by mouth daily.  . celecoxib (CELEBREX) 200 MG capsule Take 200 mg by mouth 2 (two) times daily as needed for pain.  . Cholecalciferol (VITAMIN D3) 2000 units TABS Take 2,000 Units by mouth daily.  . diphenhydrAMINE (BENADRYL) 25 MG tablet Take 25 mg by mouth daily.  Marland Kitchen. EPIPEN 2-PAK 0.3 MG/0.3ML SOAJ injection Inject 0.3 mg as directed as directed. AS NEEDED FOR ANAPHYLAXIS   . fluticasone (FLONASE) 50 MCG/ACT nasal spray Place 2 sprays into both nostrils daily.   . Melatonin 10 MG TABS Take 30 mg by mouth at bedtime.  . montelukast (SINGULAIR) 10 MG tablet Take 10 mg by mouth at bedtime.  . Multiple Vitamin (MULTIVITAMIN WITH MINERALS) TABS tablet Take 1 tablet by mouth daily. One-A-Day Active 65+  . NON FORMULARY 10 each by Other route daily. GIN SOAKED RAISINS FOR PAIN RELIEF  . Olopatadine HCl (PATADAY) 0.2 % SOLN Place 1 drop into both eyes daily.  Marland Kitchen. PARoxetine (PAXIL) 20 MG tablet Take 1 tablet (20 mg total) by mouth daily.  Marland Kitchen. zolpidem (AMBIEN) 5 MG tablet TAKE 1 TABLET BY MOUTH AT BEDTIME AS NEEDED FOR SLEEP (Patient taking differently: TAKE 1 TABLET BY MOUTH AT BEDTIME)      Allergies:   Apple; Daucus carota; Ivp dye [iodinated diagnostic agents]; Other; Strawberry (diagnostic); Strawberry extract; Iodine; and Tape   Social History:  The patient  reports that she quit smoking about 48 years ago. Her smoking use included cigarettes. She smoked 0.25 packs per day. She has never used smokeless tobacco. She reports that she drinks about 4.2 oz of alcohol per week. She reports that she does not use drugs.   Family History:  The patient's family history includes Alcoholism in her unknown relative; Heart disease in her unknown relative.  ROS:   Review of Systems  Constitutional:  Negative for chills, diaphoresis, fever, malaise/fatigue and weight loss.  HENT: Negative for congestion.   Eyes: Negative for discharge and redness.  Respiratory: Negative for cough, hemoptysis, sputum production, shortness of breath and wheezing.   Cardiovascular: Negative for chest pain, palpitations, orthopnea, claudication, leg swelling and PND.  Gastrointestinal: Negative for abdominal pain, blood in stool, heartburn, melena, nausea and vomiting.  Genitourinary: Negative for hematuria.  Musculoskeletal: Positive for joint pain. Negative for falls and myalgias.  Skin: Negative for rash.  Neurological: Negative for dizziness, tingling, tremors, sensory change, speech change, focal weakness, loss of consciousness and weakness.  Endo/Heme/Allergies: Does not bruise/bleed easily.  Psychiatric/Behavioral: Negative for substance abuse. The patient is not nervous/anxious.   All other systems reviewed and are  negative.    PHYSICAL EXAM:  VS:  BP 140/80 (BP Location: Left Arm, Patient Position: Sitting, Cuff Size: Large)   Pulse 73   Ht 5' 4.5" (1.638 m)   Wt 241 lb 8 oz (109.5 kg)   BMI 40.81 kg/m  BMI: Body mass index is 40.81 kg/m.  Physical Exam  Constitutional: She is oriented to person, place, and time. She appears well-developed and well-nourished.  HENT:  Head: Normocephalic and atraumatic.  Eyes: Right eye exhibits no discharge. Left eye exhibits no discharge.  Neck: Normal range of motion. No JVD present.  Cardiovascular: Normal rate, S1 normal, S2 normal and normal heart sounds. An irregularly irregular rhythm present. Exam reveals no distant heart sounds, no friction rub, no midsystolic click and no opening snap.  No murmur heard. Pulses:      Posterior tibial pulses are 2+ on the right side, and 2+ on the left side.  Pulmonary/Chest: Effort normal and breath sounds normal. No respiratory distress. She has no decreased breath sounds. She has no wheezes. She has no rales.  She exhibits no tenderness.  Abdominal: Soft. She exhibits no distension. There is no tenderness.  Musculoskeletal: She exhibits no edema.  Neurological: She is alert and oriented to person, place, and time.  Skin: Skin is warm and dry. No cyanosis. Nails show no clubbing.  Psychiatric: She has a normal mood and affect. Her speech is normal and behavior is normal. Judgment and thought content normal.     EKG:  Was ordered and interpreted by me today. Shows A. fib, 73 bpm, no acute ST-T changes  Recent Labs: 04/03/2017: Hemoglobin 13.4; Platelets 186 10/24/2017: ALT 15; BUN 14; Creatinine, Ser 0.90; Potassium 4.7; Sodium 142  10/24/2017: Cholesterol 147; HDL 59.60; LDL Cholesterol 69; Total CHOL/HDL Ratio 2; Triglycerides 95.0; VLDL 19.0   CrCl cannot be calculated (Patient's most recent lab result is older than the maximum 21 days allowed.).      Wt Readings from Last 3 Encounters:  03/09/18 241 lb 8 oz (109.5 kg)  10/27/17 236 lb (107 kg)  10/10/17 238 lb (108 kg)     Other studies reviewed: Additional studies/records reviewed today include: summarized above  ASSESSMENT AND PLAN:  1. Chronic A. Fib: Ventricular rates remain well controlled not requiring rate limiting medications.  Continue Eliquis 5 mg twice daily.  Recent labs as above.  She will hold Eliquis for 2 days prior to her right knee arthroplasty with resumption ideally being 24 hours after her surgery however this will be deferred to the physician performing her surgery.  2. Preoperative cardiac evaluation: She is able to achieve greater than 4 metabolic equivalents without issue.  She is low risk for a low risk knee arthroplasty.  However, she will need a nuclear stress test prior to any potential bariatric surgery.  She will call when she is ready to have this scheduled.  3. OSA: Reports issues with the seal of her CPAP mask.  Advised patient to bring this to the medical supply office for further evaluation.   Perhaps she could discuss this further with the prescribing provider.  Disposition: F/u with Dr. Kirke Corin in 6 months.  Current medicines are reviewed at length with the patient today.  The patient did not have any concerns regarding medicines.  Signed, Eula Listen, PA-C 03/09/2018 2:38 PM     [facility] [place] [address] [place], [place] [address] [phone]          Electronically  signed by Sondra Barges, PA-C at 03/09/2018 3:02 PM     Office Visit on 03/09/2018        Detailed Report       Note shared with patient

## 2018-03-25 ENCOUNTER — Encounter
Admission: RE | Disposition: A | Discharge status: Skilled Nursing Facility | Payer: Self-pay | Source: Ambulatory Visit | Attending: Orthopedic Surgery

## 2018-03-25 ENCOUNTER — Inpatient Hospital Stay: Payer: Medicare HMO

## 2018-03-25 ENCOUNTER — Inpatient Hospital Stay: Payer: Medicare HMO | Admitting: Anesthesiology

## 2018-03-25 ENCOUNTER — Inpatient Hospital Stay
Admission: RE | Admit: 2018-03-25 | Discharge: 2018-03-27 | DRG: 470 | Disposition: A | Discharge status: Skilled Nursing Facility | Payer: Medicare HMO | Source: Ambulatory Visit | Attending: Orthopedic Surgery | Admitting: Orthopedic Surgery

## 2018-03-25 ENCOUNTER — Encounter: Payer: Self-pay | Admitting: *Deleted

## 2018-03-25 ENCOUNTER — Other Ambulatory Visit: Payer: Self-pay

## 2018-03-25 DIAGNOSIS — Z7901 Long term (current) use of anticoagulants: Secondary | ICD-10-CM

## 2018-03-25 DIAGNOSIS — Z96659 Presence of unspecified artificial knee joint: Secondary | ICD-10-CM

## 2018-03-25 DIAGNOSIS — Z87891 Personal history of nicotine dependence: Secondary | ICD-10-CM | POA: Diagnosis not present

## 2018-03-25 DIAGNOSIS — H4020X0 Unspecified primary angle-closure glaucoma, stage unspecified: Secondary | ICD-10-CM | POA: Diagnosis present

## 2018-03-25 DIAGNOSIS — Z96651 Presence of right artificial knee joint: Secondary | ICD-10-CM | POA: Diagnosis not present

## 2018-03-25 DIAGNOSIS — I482 Chronic atrial fibrillation: Secondary | ICD-10-CM | POA: Diagnosis present

## 2018-03-25 DIAGNOSIS — Z91048 Other nonmedicinal substance allergy status: Secondary | ICD-10-CM

## 2018-03-25 DIAGNOSIS — F329 Major depressive disorder, single episode, unspecified: Secondary | ICD-10-CM | POA: Diagnosis present

## 2018-03-25 DIAGNOSIS — Z6841 Body Mass Index (BMI) 40.0 and over, adult: Secondary | ICD-10-CM

## 2018-03-25 DIAGNOSIS — H269 Unspecified cataract: Secondary | ICD-10-CM | POA: Diagnosis not present

## 2018-03-25 DIAGNOSIS — Z8679 Personal history of other diseases of the circulatory system: Secondary | ICD-10-CM | POA: Diagnosis not present

## 2018-03-25 DIAGNOSIS — G4733 Obstructive sleep apnea (adult) (pediatric): Secondary | ICD-10-CM | POA: Diagnosis present

## 2018-03-25 DIAGNOSIS — R262 Difficulty in walking, not elsewhere classified: Secondary | ICD-10-CM | POA: Diagnosis not present

## 2018-03-25 DIAGNOSIS — G4700 Insomnia, unspecified: Secondary | ICD-10-CM | POA: Diagnosis not present

## 2018-03-25 DIAGNOSIS — Z91041 Radiographic dye allergy status: Secondary | ICD-10-CM | POA: Diagnosis not present

## 2018-03-25 DIAGNOSIS — Z471 Aftercare following joint replacement surgery: Secondary | ICD-10-CM | POA: Diagnosis not present

## 2018-03-25 DIAGNOSIS — J302 Other seasonal allergic rhinitis: Secondary | ICD-10-CM | POA: Diagnosis present

## 2018-03-25 DIAGNOSIS — F419 Anxiety disorder, unspecified: Secondary | ICD-10-CM | POA: Diagnosis present

## 2018-03-25 DIAGNOSIS — K219 Gastro-esophageal reflux disease without esophagitis: Secondary | ICD-10-CM | POA: Diagnosis present

## 2018-03-25 DIAGNOSIS — Z791 Long term (current) use of non-steroidal anti-inflammatories (NSAID): Secondary | ICD-10-CM

## 2018-03-25 DIAGNOSIS — Z91018 Allergy to other foods: Secondary | ICD-10-CM

## 2018-03-25 DIAGNOSIS — D649 Anemia, unspecified: Secondary | ICD-10-CM | POA: Diagnosis present

## 2018-03-25 DIAGNOSIS — Z9989 Dependence on other enabling machines and devices: Secondary | ICD-10-CM

## 2018-03-25 DIAGNOSIS — Z888 Allergy status to other drugs, medicaments and biological substances status: Secondary | ICD-10-CM | POA: Diagnosis not present

## 2018-03-25 DIAGNOSIS — Z79899 Other long term (current) drug therapy: Secondary | ICD-10-CM

## 2018-03-25 DIAGNOSIS — Z9189 Other specified personal risk factors, not elsewhere classified: Secondary | ICD-10-CM | POA: Diagnosis not present

## 2018-03-25 DIAGNOSIS — Z4789 Encounter for other orthopedic aftercare: Secondary | ICD-10-CM | POA: Diagnosis not present

## 2018-03-25 DIAGNOSIS — R69 Illness, unspecified: Secondary | ICD-10-CM | POA: Diagnosis not present

## 2018-03-25 DIAGNOSIS — M1711 Unilateral primary osteoarthritis, right knee: Principal | ICD-10-CM | POA: Diagnosis present

## 2018-03-25 DIAGNOSIS — E569 Vitamin deficiency, unspecified: Secondary | ICD-10-CM | POA: Diagnosis not present

## 2018-03-25 DIAGNOSIS — E785 Hyperlipidemia, unspecified: Secondary | ICD-10-CM | POA: Diagnosis not present

## 2018-03-25 DIAGNOSIS — I10 Essential (primary) hypertension: Secondary | ICD-10-CM | POA: Diagnosis not present

## 2018-03-25 HISTORY — PX: KNEE ARTHROPLASTY: SHX992

## 2018-03-25 HISTORY — DX: Presence of unspecified artificial knee joint: Z96.659

## 2018-03-25 LAB — ABO/RH: ABO/RH(D): A POS

## 2018-03-25 SURGERY — ARTHROPLASTY, KNEE, TOTAL, USING IMAGELESS COMPUTER-ASSISTED NAVIGATION
Anesthesia: Spinal | Site: Knee | Laterality: Right | Wound class: Clean

## 2018-03-25 MED ORDER — FLUTICASONE PROPIONATE 50 MCG/ACT NA SUSP
2.0000 | Freq: Every day | NASAL | Status: DC
  Administered 2018-03-26 – 2018-03-27 (×2): 2 via NASAL
  Filled 2018-03-25: qty 16

## 2018-03-25 MED ORDER — FENTANYL CITRATE (PF) 100 MCG/2ML IJ SOLN: 25.0000 ug | INTRAMUSCULAR | Status: DC | PRN

## 2018-03-25 MED ORDER — TRANEXAMIC ACID 1000 MG/10ML IV SOLN
1000.0000 mg | Freq: Once | INTRAVENOUS | Status: AC
  Administered 2018-03-25: 1000 mg via INTRAVENOUS
  Filled 2018-03-25: qty 1100

## 2018-03-25 MED ORDER — SODIUM CHLORIDE FLUSH 0.9 % IV SOLN
INTRAVENOUS | Status: AC
  Filled 2018-03-25: qty 40

## 2018-03-25 MED ORDER — ZOLPIDEM TARTRATE 5 MG PO TABS
5.0000 mg | ORAL_TABLET | Freq: Every evening | ORAL | Status: DC | PRN
  Administered 2018-03-26: 5 mg via ORAL
  Filled 2018-03-25: qty 1

## 2018-03-25 MED ORDER — LACTATED RINGERS IV SOLN
INTRAVENOUS | Status: DC
  Administered 2018-03-25 (×2): via INTRAVENOUS

## 2018-03-25 MED ORDER — KETOROLAC TROMETHAMINE 0.5 % OP SOLN
1.0000 [drp] | Freq: Three times a day (TID) | OPHTHALMIC | Status: DC | PRN
  Administered 2018-03-25: 1 [drp] via OPHTHALMIC
  Filled 2018-03-25 (×2): qty 3

## 2018-03-25 MED ORDER — ONDANSETRON HCL 4 MG/2ML IJ SOLN: 4.0000 mg | Freq: Four times a day (QID) | INTRAMUSCULAR | Status: DC | PRN

## 2018-03-25 MED ORDER — HYDROMORPHONE HCL 1 MG/ML IJ SOLN: 0.5000 mg | INTRAMUSCULAR | Status: DC | PRN

## 2018-03-25 MED ORDER — PANTOPRAZOLE SODIUM 40 MG PO TBEC
40.0000 mg | DELAYED_RELEASE_TABLET | Freq: Two times a day (BID) | ORAL | Status: DC
  Administered 2018-03-25 – 2018-03-27 (×4): 40 mg via ORAL
  Filled 2018-03-25 (×4): qty 1

## 2018-03-25 MED ORDER — SODIUM CHLORIDE 0.9 % IV SOLN
INTRAVENOUS | Status: DC | PRN
  Administered 2018-03-25: 30 ug/min via INTRAVENOUS

## 2018-03-25 MED ORDER — GLYCOPYRROLATE 0.2 MG/ML IJ SOLN
INTRAMUSCULAR | Status: AC
  Filled 2018-03-25: qty 1

## 2018-03-25 MED ORDER — MONTELUKAST SODIUM 10 MG PO TABS
10.0000 mg | ORAL_TABLET | Freq: Every day | ORAL | Status: DC
  Administered 2018-03-25 – 2018-03-26 (×2): 10 mg via ORAL
  Filled 2018-03-25 (×2): qty 1

## 2018-03-25 MED ORDER — GLYCOPYRROLATE 0.2 MG/ML IJ SOLN
INTRAMUSCULAR | Status: DC | PRN
  Administered 2018-03-25: 0.2 mg via INTRAVENOUS

## 2018-03-25 MED ORDER — MAGNESIUM HYDROXIDE 400 MG/5ML PO SUSP
30.0000 mL | Freq: Every day | ORAL | Status: DC
  Administered 2018-03-26: 30 mL via ORAL
  Filled 2018-03-25 (×2): qty 30

## 2018-03-25 MED ORDER — ACETAMINOPHEN 10 MG/ML IV SOLN
1000.0000 mg | Freq: Four times a day (QID) | INTRAVENOUS | Status: AC
  Administered 2018-03-25 – 2018-03-26 (×3): 1000 mg via INTRAVENOUS
  Filled 2018-03-25 (×4): qty 100

## 2018-03-25 MED ORDER — SODIUM CHLORIDE 0.9 % IJ SOLN
INTRAMUSCULAR | Status: AC
  Filled 2018-03-25: qty 10

## 2018-03-25 MED ORDER — SENNOSIDES-DOCUSATE SODIUM 8.6-50 MG PO TABS
1.0000 | ORAL_TABLET | Freq: Two times a day (BID) | ORAL | Status: DC
  Administered 2018-03-25 – 2018-03-26 (×3): 1 via ORAL
  Filled 2018-03-25 (×4): qty 1

## 2018-03-25 MED ORDER — GABAPENTIN 300 MG PO CAPS
300.0000 mg | ORAL_CAPSULE | Freq: Once | ORAL | Status: AC
  Administered 2018-03-25: 300 mg via ORAL

## 2018-03-25 MED ORDER — PROPOFOL 500 MG/50ML IV EMUL
INTRAVENOUS | Status: DC | PRN
  Administered 2018-03-25: 70 ug/kg/min via INTRAVENOUS

## 2018-03-25 MED ORDER — PHENOL 1.4 % MT LIQD
1.0000 | OROMUCOSAL | Status: DC | PRN
  Filled 2018-03-25: qty 177

## 2018-03-25 MED ORDER — SODIUM CHLORIDE 0.9 % IV SOLN
INTRAVENOUS | Status: DC | PRN
  Administered 2018-03-25: 60 mL

## 2018-03-25 MED ORDER — LIDOCAINE HCL (PF) 2 % IJ SOLN
INTRAMUSCULAR | Status: AC
  Filled 2018-03-25: qty 10

## 2018-03-25 MED ORDER — ADULT MULTIVITAMIN W/MINERALS CH
1.0000 | ORAL_TABLET | Freq: Every day | ORAL | Status: DC
  Administered 2018-03-26 – 2018-03-27 (×2): 1 via ORAL
  Filled 2018-03-25 (×2): qty 1

## 2018-03-25 MED ORDER — OLOPATADINE HCL 0.1 % OP SOLN
1.0000 [drp] | Freq: Two times a day (BID) | OPHTHALMIC | Status: DC
  Administered 2018-03-26 – 2018-03-27 (×2): 1 [drp] via OPHTHALMIC
  Filled 2018-03-25: qty 5

## 2018-03-25 MED ORDER — DEXAMETHASONE SODIUM PHOSPHATE 10 MG/ML IJ SOLN
8.0000 mg | Freq: Once | INTRAMUSCULAR | Status: AC
  Administered 2018-03-25: 8 mg via INTRAVENOUS

## 2018-03-25 MED ORDER — METOCLOPRAMIDE HCL 10 MG PO TABS
5.0000 mg | ORAL_TABLET | Freq: Three times a day (TID) | ORAL | Status: DC | PRN
  Filled 2018-03-25: qty 1

## 2018-03-25 MED ORDER — ACETAMINOPHEN 10 MG/ML IV SOLN
INTRAVENOUS | Status: AC
  Filled 2018-03-25: qty 100

## 2018-03-25 MED ORDER — CEFAZOLIN SODIUM-DEXTROSE 2-3 GM-%(50ML) IV SOLR
INTRAVENOUS | Status: DC | PRN
  Administered 2018-03-25: 2 g via INTRAVENOUS

## 2018-03-25 MED ORDER — CELECOXIB 200 MG PO CAPS
ORAL_CAPSULE | ORAL | Status: AC
  Filled 2018-03-25: qty 2

## 2018-03-25 MED ORDER — VITAMIN D 1000 UNITS PO TABS
2000.0000 [IU] | ORAL_TABLET | Freq: Every day | ORAL | Status: DC
  Administered 2018-03-26 – 2018-03-27 (×2): 2000 [IU] via ORAL
  Filled 2018-03-25 (×3): qty 2

## 2018-03-25 MED ORDER — ATORVASTATIN CALCIUM 20 MG PO TABS
20.0000 mg | ORAL_TABLET | ORAL | Status: DC
  Administered 2018-03-26: 20 mg via ORAL

## 2018-03-25 MED ORDER — PROPOFOL 10 MG/ML IV BOLUS
INTRAVENOUS | Status: DC | PRN
  Administered 2018-03-25: 30 mg via INTRAVENOUS
  Administered 2018-03-25 (×3): 22 mg via INTRAVENOUS

## 2018-03-25 MED ORDER — ACETAMINOPHEN 10 MG/ML IV SOLN
INTRAVENOUS | Status: DC | PRN
  Administered 2018-03-25: 1000 mg via INTRAVENOUS

## 2018-03-25 MED ORDER — FAMOTIDINE 20 MG PO TABS
ORAL_TABLET | ORAL | Status: AC
  Filled 2018-03-25: qty 1

## 2018-03-25 MED ORDER — FAMOTIDINE 20 MG PO TABS
20.0000 mg | ORAL_TABLET | Freq: Once | ORAL | Status: AC
  Administered 2018-03-25: 20 mg via ORAL

## 2018-03-25 MED ORDER — BUPIVACAINE HCL (PF) 0.5 % IJ SOLN
INTRAMUSCULAR | Status: AC
Start: 2018-03-25 — End: ?
  Filled 2018-03-25: qty 10

## 2018-03-25 MED ORDER — OXYCODONE HCL 5 MG PO TABS
10.0000 mg | ORAL_TABLET | ORAL | Status: DC | PRN
  Administered 2018-03-25 – 2018-03-27 (×5): 10 mg via ORAL
  Filled 2018-03-25 (×5): qty 2

## 2018-03-25 MED ORDER — ACETAMINOPHEN 10 MG/ML IV SOLN
INTRAVENOUS | Status: AC
Start: 2018-03-25 — End: ?
  Filled 2018-03-25: qty 100

## 2018-03-25 MED ORDER — CELECOXIB 200 MG PO CAPS
400.0000 mg | ORAL_CAPSULE | Freq: Once | ORAL | Status: AC
  Administered 2018-03-25: 400 mg via ORAL

## 2018-03-25 MED ORDER — NEOMYCIN-POLYMYXIN B GU 40-200000 IR SOLN
Status: AC
  Filled 2018-03-25: qty 20

## 2018-03-25 MED ORDER — DEXAMETHASONE SODIUM PHOSPHATE 10 MG/ML IJ SOLN
INTRAMUSCULAR | Status: AC
  Filled 2018-03-25: qty 1

## 2018-03-25 MED ORDER — FENTANYL CITRATE (PF) 100 MCG/2ML IJ SOLN
INTRAMUSCULAR | Status: AC
  Filled 2018-03-25: qty 2

## 2018-03-25 MED ORDER — NEOMYCIN-POLYMYXIN B GU 40-200000 IR SOLN
Status: DC | PRN
  Administered 2018-03-25: 14 mL

## 2018-03-25 MED ORDER — BUPIVACAINE HCL (PF) 0.25 % IJ SOLN
INTRAMUSCULAR | Status: AC
  Filled 2018-03-25: qty 30

## 2018-03-25 MED ORDER — BUPIVACAINE HCL (PF) 0.25 % IJ SOLN
INTRAMUSCULAR | Status: DC | PRN
  Administered 2018-03-25: 60 mL

## 2018-03-25 MED ORDER — CELECOXIB 200 MG PO CAPS
200.0000 mg | ORAL_CAPSULE | Freq: Two times a day (BID) | ORAL | Status: DC
  Administered 2018-03-25 – 2018-03-27 (×4): 200 mg via ORAL
  Filled 2018-03-25 (×4): qty 1

## 2018-03-25 MED ORDER — TRAMADOL HCL 50 MG PO TABS
50.0000 mg | ORAL_TABLET | ORAL | Status: DC | PRN
  Administered 2018-03-25 – 2018-03-26 (×2): 100 mg via ORAL
  Administered 2018-03-26: 50 mg via ORAL
  Administered 2018-03-27: 100 mg via ORAL
  Filled 2018-03-25 (×5): qty 2

## 2018-03-25 MED ORDER — TRANEXAMIC ACID 1000 MG/10ML IV SOLN
INTRAVENOUS | Status: DC | PRN
  Administered 2018-03-25: 1000 mg via INTRAVENOUS

## 2018-03-25 MED ORDER — PAROXETINE HCL 20 MG PO TABS
20.0000 mg | ORAL_TABLET | Freq: Every day | ORAL | Status: DC
  Administered 2018-03-26 – 2018-03-27 (×2): 20 mg via ORAL
  Filled 2018-03-25 (×2): qty 1

## 2018-03-25 MED ORDER — GABAPENTIN 300 MG PO CAPS
300.0000 mg | ORAL_CAPSULE | Freq: Every day | ORAL | Status: DC
  Administered 2018-03-25 – 2018-03-26 (×2): 300 mg via ORAL
  Filled 2018-03-25 (×2): qty 1

## 2018-03-25 MED ORDER — BSS IO SOLN
15.0000 mL | Freq: Once | INTRAOCULAR | Status: DC
  Filled 2018-03-25: qty 15

## 2018-03-25 MED ORDER — FENTANYL CITRATE (PF) 100 MCG/2ML IJ SOLN
INTRAMUSCULAR | Status: DC | PRN
  Administered 2018-03-25: 100 ug via INTRAVENOUS

## 2018-03-25 MED ORDER — BUPIVACAINE LIPOSOME 1.3 % IJ SUSP
INTRAMUSCULAR | Status: AC
  Filled 2018-03-25: qty 20

## 2018-03-25 MED ORDER — FERROUS SULFATE 325 (65 FE) MG PO TABS
325.0000 mg | ORAL_TABLET | Freq: Two times a day (BID) | ORAL | Status: DC
  Administered 2018-03-25 – 2018-03-27 (×4): 325 mg via ORAL
  Filled 2018-03-25 (×4): qty 1

## 2018-03-25 MED ORDER — OXYCODONE HCL 5 MG PO TABS
5.0000 mg | ORAL_TABLET | ORAL | Status: DC | PRN
  Administered 2018-03-25: 5 mg via ORAL
  Filled 2018-03-25: qty 1

## 2018-03-25 MED ORDER — METOCLOPRAMIDE HCL 10 MG PO TABS
10.0000 mg | ORAL_TABLET | Freq: Three times a day (TID) | ORAL | Status: AC
  Administered 2018-03-25 – 2018-03-27 (×5): 10 mg via ORAL
  Filled 2018-03-25 (×6): qty 1

## 2018-03-25 MED ORDER — PROPOFOL 500 MG/50ML IV EMUL
INTRAVENOUS | Status: AC
  Filled 2018-03-25: qty 50

## 2018-03-25 MED ORDER — ALUM & MAG HYDROXIDE-SIMETH 200-200-20 MG/5ML PO SUSP: 30.0000 mL | ORAL | Status: DC | PRN

## 2018-03-25 MED ORDER — GABAPENTIN 300 MG PO CAPS
ORAL_CAPSULE | ORAL | Status: AC
  Filled 2018-03-25: qty 1

## 2018-03-25 MED ORDER — ONDANSETRON HCL 4 MG PO TABS: 4.0000 mg | ORAL_TABLET | Freq: Four times a day (QID) | ORAL | Status: DC | PRN

## 2018-03-25 MED ORDER — MIDAZOLAM HCL 2 MG/2ML IJ SOLN
INTRAMUSCULAR | Status: AC
  Filled 2018-03-25: qty 2

## 2018-03-25 MED ORDER — PHENYLEPHRINE HCL 10 MG/ML IJ SOLN
INTRAMUSCULAR | Status: AC
  Filled 2018-03-25: qty 1

## 2018-03-25 MED ORDER — METOCLOPRAMIDE HCL 5 MG/ML IJ SOLN: 5.0000 mg | Freq: Three times a day (TID) | INTRAMUSCULAR | Status: DC | PRN

## 2018-03-25 MED ORDER — SODIUM CHLORIDE 0.9 % IV SOLN
INTRAVENOUS | Status: DC
  Administered 2018-03-25 – 2018-03-26 (×2): via INTRAVENOUS

## 2018-03-25 MED ORDER — BUPIVACAINE HCL (PF) 0.5 % IJ SOLN
INTRAMUSCULAR | Status: DC | PRN
  Administered 2018-03-25: 3 mL

## 2018-03-25 MED ORDER — DIPHENHYDRAMINE HCL 12.5 MG/5ML PO ELIX
12.5000 mg | ORAL_SOLUTION | ORAL | Status: DC | PRN
  Administered 2018-03-26: 25 mg via ORAL
  Filled 2018-03-25 (×2): qty 5

## 2018-03-25 MED ORDER — APIXABAN 5 MG PO TABS
5.0000 mg | ORAL_TABLET | Freq: Two times a day (BID) | ORAL | Status: DC
  Administered 2018-03-26 – 2018-03-27 (×3): 5 mg via ORAL
  Filled 2018-03-25 (×3): qty 1

## 2018-03-25 MED ORDER — BISACODYL 10 MG RE SUPP
10.0000 mg | Freq: Every day | RECTAL | Status: DC | PRN
  Administered 2018-03-27: 10 mg via RECTAL
  Filled 2018-03-25: qty 1

## 2018-03-25 MED ORDER — CHLORHEXIDINE GLUCONATE 4 % EX LIQD
60.0000 mL | Freq: Once | CUTANEOUS | Status: AC
  Administered 2018-03-25: 4 via TOPICAL

## 2018-03-25 MED ORDER — MIDAZOLAM HCL 5 MG/5ML IJ SOLN
INTRAMUSCULAR | Status: DC | PRN
  Administered 2018-03-25 (×2): 1 mg via INTRAVENOUS

## 2018-03-25 MED ORDER — ACETAMINOPHEN 325 MG PO TABS
325.0000 mg | ORAL_TABLET | Freq: Four times a day (QID) | ORAL | Status: DC | PRN
  Administered 2018-03-27: 650 mg via ORAL
  Filled 2018-03-25: qty 2

## 2018-03-25 MED ORDER — FLEET ENEMA 7-19 GM/118ML RE ENEM: 1.0000 | ENEMA | Freq: Once | RECTAL | Status: DC | PRN

## 2018-03-25 MED ORDER — MENTHOL 3 MG MT LOZG
1.0000 | LOZENGE | OROMUCOSAL | Status: DC | PRN
  Administered 2018-03-25 (×2): 3 mg via ORAL
  Filled 2018-03-25 (×2): qty 9

## 2018-03-25 MED ORDER — ONDANSETRON HCL 4 MG/2ML IJ SOLN: 4.0000 mg | Freq: Once | INTRAMUSCULAR | Status: DC | PRN

## 2018-03-25 MED ORDER — DIPHENHYDRAMINE HCL 25 MG PO CAPS
25.0000 mg | ORAL_CAPSULE | ORAL | Status: DC
  Administered 2018-03-27: 25 mg via ORAL
  Filled 2018-03-25 (×2): qty 1

## 2018-03-25 MED ORDER — CEFAZOLIN SODIUM-DEXTROSE 2-4 GM/100ML-% IV SOLN
2.0000 g | Freq: Four times a day (QID) | INTRAVENOUS | Status: AC
  Administered 2018-03-25 – 2018-03-26 (×4): 2 g via INTRAVENOUS
  Filled 2018-03-25 (×5): qty 100

## 2018-03-25 MED ORDER — CEFAZOLIN SODIUM-DEXTROSE 2-4 GM/100ML-% IV SOLN
INTRAVENOUS | Status: AC
  Filled 2018-03-25: qty 100

## 2018-03-25 SURGICAL SUPPLY — 68 items
BATTERY INSTRU NAVIGATION (MISCELLANEOUS) ×8 IMPLANT
BLADE CLIPPER SURG (BLADE) ×2 IMPLANT
BLADE SAGITTAL 25.0X1.19X90 (BLADE) ×2 IMPLANT
BLADE SAW 1/2 (BLADE) ×2 IMPLANT
BLADE SAW 70X12.5 (BLADE) IMPLANT
BONE CEMENT GENTAMICIN (Cement) ×4 IMPLANT
CANISTER SUCT 1200ML W/VALVE (MISCELLANEOUS) ×2 IMPLANT
CANISTER SUCT 3000ML PPV (MISCELLANEOUS) ×4 IMPLANT
CAPT KNEE TOTAL 3 ATTUNE ×2 IMPLANT
CEMENT BONE GENTAMICIN 40 (Cement) ×2 IMPLANT
COOLER POLAR GLACIER W/PUMP (MISCELLANEOUS) ×2 IMPLANT
CUFF TOURN 24 STER (MISCELLANEOUS) IMPLANT
CUFF TOURN 30 STER DUAL PORT (MISCELLANEOUS) ×2 IMPLANT
DRAPE SHEET LG 3/4 BI-LAMINATE (DRAPES) ×2 IMPLANT
DRSG DERMACEA 8X12 NADH (GAUZE/BANDAGES/DRESSINGS) ×2 IMPLANT
DRSG OPSITE POSTOP 4X14 (GAUZE/BANDAGES/DRESSINGS) ×2 IMPLANT
DRSG TEGADERM 4X4.75 (GAUZE/BANDAGES/DRESSINGS) ×2 IMPLANT
DURAPREP 26ML APPLICATOR (WOUND CARE) ×4 IMPLANT
ELECT CAUTERY BLADE 6.4 (BLADE) ×2 IMPLANT
ELECT REM PT RETURN 9FT ADLT (ELECTROSURGICAL) ×2
ELECTRODE REM PT RTRN 9FT ADLT (ELECTROSURGICAL) ×1 IMPLANT
EX-PIN ORTHOLOCK NAV 4X150 (PIN) ×4 IMPLANT
GLOVE BIOGEL M STRL SZ7.5 (GLOVE) ×4 IMPLANT
GLOVE BIOGEL PI IND STRL 7.5 (GLOVE) ×4 IMPLANT
GLOVE BIOGEL PI IND STRL 9 (GLOVE) ×1 IMPLANT
GLOVE BIOGEL PI INDICATOR 7.5 (GLOVE) ×4
GLOVE BIOGEL PI INDICATOR 9 (GLOVE) ×1
GLOVE INDICATOR 8.0 STRL GRN (GLOVE) ×4 IMPLANT
GLOVE SURG SYN 9.0  PF PI (GLOVE) ×1
GLOVE SURG SYN 9.0 PF PI (GLOVE) ×1 IMPLANT
GOWN STRL REUS W/ TWL LRG LVL3 (GOWN DISPOSABLE) ×3 IMPLANT
GOWN STRL REUS W/TWL 2XL LVL3 (GOWN DISPOSABLE) ×2 IMPLANT
GOWN STRL REUS W/TWL LRG LVL3 (GOWN DISPOSABLE) ×3
HEMOVAC 400CC 10FR (MISCELLANEOUS) ×2 IMPLANT
HOLDER FOLEY CATH W/STRAP (MISCELLANEOUS) ×2 IMPLANT
HOOD PEEL AWAY FLYTE STAYCOOL (MISCELLANEOUS) ×4 IMPLANT
KIT TURNOVER KIT A (KITS) ×2 IMPLANT
KNIFE SCULPS 14X20 (INSTRUMENTS) ×2 IMPLANT
LABEL OR SOLS (LABEL) ×2 IMPLANT
NDL SAFETY ECLIPSE 18X1.5 (NEEDLE) ×1 IMPLANT
NEEDLE HYPO 18GX1.5 SHARP (NEEDLE) ×1
NEEDLE SPNL 20GX3.5 QUINCKE YW (NEEDLE) ×4 IMPLANT
NS IRRIG 500ML POUR BTL (IV SOLUTION) ×2 IMPLANT
PACK TOTAL KNEE (MISCELLANEOUS) ×2 IMPLANT
PAD WRAPON POLAR KNEE (MISCELLANEOUS) ×1 IMPLANT
PIN DRILL QUICK PACK ×2 IMPLANT
PIN FIXATION 1/8DIA X 3INL (PIN) ×2 IMPLANT
PULSAVAC PLUS IRRIG FAN TIP (DISPOSABLE) ×2
SOL .9 NS 3000ML IRR  AL (IV SOLUTION) ×1
SOL .9 NS 3000ML IRR UROMATIC (IV SOLUTION) ×1 IMPLANT
SOL PREP PVP 2OZ (MISCELLANEOUS) ×2
SOLUTION PREP PVP 2OZ (MISCELLANEOUS) ×1 IMPLANT
SPONGE DRAIN TRACH 4X4 STRL 2S (GAUZE/BANDAGES/DRESSINGS) ×2 IMPLANT
STAPLER SKIN PROX 35W (STAPLE) ×2 IMPLANT
STOCKINETTE IMPERV 14X48 (MISCELLANEOUS) ×2 IMPLANT
STRAP TIBIA SHORT (MISCELLANEOUS) ×2 IMPLANT
SUCTION FRAZIER HANDLE 10FR (MISCELLANEOUS) ×1
SUCTION TUBE FRAZIER 10FR DISP (MISCELLANEOUS) ×1 IMPLANT
SUT VIC AB 0 CT1 36 (SUTURE) ×2 IMPLANT
SUT VIC AB 1 CT1 36 (SUTURE) ×4 IMPLANT
SUT VIC AB 2-0 CT2 27 (SUTURE) ×2 IMPLANT
SYR 20CC LL (SYRINGE) ×2 IMPLANT
SYR 30ML LL (SYRINGE) ×4 IMPLANT
TIP FAN IRRIG PULSAVAC PLUS (DISPOSABLE) ×1 IMPLANT
TOWEL OR 17X26 4PK STRL BLUE (TOWEL DISPOSABLE) ×2 IMPLANT
TOWER CARTRIDGE SMART MIX (DISPOSABLE) ×2 IMPLANT
TRAY FOLEY MTR SLVR 16FR STAT (SET/KITS/TRAYS/PACK) ×2 IMPLANT
WRAPON POLAR PAD KNEE (MISCELLANEOUS) ×2

## 2018-03-25 NOTE — [place] FL2 (Signed)
[place] MEDICAID FL2 LEVEL OF CARE SCREENING TOOL     IDENTIFICATION  Patient Name: Crystal Haas Birthdate: [birth year] Sex: female Admission Date (Current Location): 03/25/2018  [place] and [place] Number:  Chiropodist and Address:  [facility], [address], [place], [place] [address]      Provider Number: [id number]  Attending Physician Name and Address:  Donato Heinz, MD  Relative Name and Phone Number:       Current Level of Care: Hospital Recommended Level of Care: Skilled Nursing Facility Prior Approval Number:    Date Approved/Denied:   PASRR Number: ([id number])  Discharge Plan: SNF    Current Diagnoses: Patient Active Problem List   Diagnosis Date Noted  . 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 Hand tingling 10/11/2017  . Morbid obesity (HCC) 10/11/2017  . Degenerative arthritis of right knee 07/17/2017  . Status post bilateral breast reduction 09/06/2016  . Allergic rhinitis 09/02/2016  . Skin lesion 09/02/2016  . Symptomatic mammary hypertrophy 08/29/2016  . Change of skin color 08/06/2016  . Excessive crying 05/16/2016  . Right knee pain 02/15/2016  . Injury of right rotator cuff 02/15/2016  . Hyperkalemia 09/12/2015  . Chronic atrial fibrillation (HCC) 09/11/2015  . Encounter for general adult medical examination with abnormal findings 09/04/2015  . Sleep apnea 08/30/2015  . Depression 06/21/2015  . Chronic insomnia 05/30/2015  . Blepharitis 07/13/2014  . Anxiety 05/27/2014  . Hypertension 05/27/2014  . Cataracts, bilateral 05/25/2014  . 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  . 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 Macular hole of left eye 05/24/2014  . 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 )  AMBULATORY STATUS COMMUNICATION OF NEEDS Skin   Extensive Assist Verbally Surgical wounds(Incision: Right Knee )                       Personal Care Assistance Level of Assistance  Bathing, Feeding, Dressing Bathing Assistance: Limited assistance Feeding assistance: Independent Dressing Assistance: Limited assistance     Functional Limitations Info  Sight, Hearing, Speech Sight Info: Adequate Hearing Info: Adequate Speech Info: Adequate    SPECIAL CARE FACTORS FREQUENCY  PT (By licensed PT), OT (By licensed OT)     PT Frequency: (5) OT Frequency: (5)            Contractures      Additional Factors Info  Code Status, Allergies Code Status Info: (Full Code. ) Allergies Info: (Apple, Daucus Carota, Ivp Dye Iodinated Diagnostic Agents, Other, Strawberry (Diagnostic), Strawberry Extract, Iodine, Tape)           Current Medications (03/25/2018):  This is the current hospital active medication list Current Facility-Administered Medications  Medication Dose Route Frequency Provider Last Rate Last Dose  . 0.9 %  sodium chloride infusion   Intravenous Continuous Hooten, Illene Labrador, MD 100 mL/hr at 03/25/18 1423    . acetaminophen (OFIRMEV) IV 1,000 mg  1,000 mg Intravenous Q6H Hooten, Illene Labrador, MD      . Melene Muller ON 03/26/2018] acetaminophen (TYLENOL) tablet 325-650 mg  325-650 mg Oral Q6H PRN Hooten, Illene Labrador, MD      .  alum & mag hydroxide-simeth (MAALOX/MYLANTA) 200-200-20 MG/5ML suspension 30 mL  30 mL Oral Q4H PRN Hooten, Illene LabradorJames P, MD      . Melene Muller[START ON 03/26/2018] apixaban (ELIQUIS) tablet 5 mg  5 mg Oral BID Donato HeinzHooten, James P, MD      . Melene Muller[START ON 03/26/2018] atorvastatin (LIPITOR) tablet 20 mg  20 mg Oral BH-q7a Hooten, Illene LabradorJames P, MD      . bisacodyl (DULCOLAX) suppository 10 mg  10 mg Rectal Daily PRN Hooten, Illene LabradorJames P, MD      . ceFAZolin (ANCEF) 2-4 GM/100ML-% IVPB            . ceFAZolin (ANCEF) IVPB 2g/100 mL premix  2 g Intravenous Q6H Hooten, Illene LabradorJames P, MD      . celecoxib (CELEBREX) 200 MG capsule           . celecoxib (CELEBREX) 200 MG capsule           . celecoxib (CELEBREX) capsule 200 mg  200 mg Oral BID Hooten, Illene LabradorJames P, MD      . cholecalciferol (VITAMIN D) tablet 2,000 Units  2,000 Units Oral Daily Hooten, Illene LabradorJames P, MD      . dexamethasone (DECADRON) 10 MG/ML injection           . diphenhydrAMINE (BENADRYL) 12.5 MG/5ML elixir 12.5-25 mg  12.5-25 mg Oral Q4H PRN Donato HeinzHooten, James P, MD      . Melene Muller[START ON 03/26/2018] diphenhydrAMINE (BENADRYL) capsule 25 mg  25 mg Oral BH-q7a Hooten, Illene LabradorJames P, MD      . famotidine (PEPCID) 20 MG tablet           . ferrous sulfate tablet 325 mg  325 mg Oral BID WC Hooten, Illene LabradorJames P, MD      . fluticasone (FLONASE) 50 MCG/ACT nasal spray 2 spray  2 spray Each Nare Daily Hooten, Illene LabradorJames P, MD      . gabapentin (NEURONTIN) 300 MG capsule           . gabapentin (NEURONTIN) capsule 300 mg  300 mg Oral QHS Hooten, Illene LabradorJames P, MD      . HYDROmorphone (DILAUDID) injection 0.5-1 mg  0.5-1 mg Intravenous Q4H PRN Hooten, Illene LabradorJames P, MD      . magnesium hydroxide (MILK OF MAGNESIA) suspension 30 mL  30 mL Oral Daily Hooten, Illene LabradorJames P, MD      . menthol-cetylpyridinium (CEPACOL) lozenge 3 mg  1 lozenge Oral PRN Hooten, Illene LabradorJames P, MD       Or  . phenol (CHLORASEPTIC) mouth spray 1 spray  1 spray Mouth/Throat PRN Hooten, Illene LabradorJames P, MD      . metoCLOPramide (REGLAN) tablet 5-10 mg  5-10 mg Oral Q8H PRN Hooten, Illene LabradorJames P, MD       Or  . metoCLOPramide (REGLAN) injection 5-10 mg  5-10 mg Intravenous Q8H PRN Hooten, Illene LabradorJames P, MD      . metoCLOPramide (REGLAN) tablet 10 mg  10 mg Oral TID AC & HS Hooten, Illene LabradorJames P, MD      . montelukast (SINGULAIR) tablet 10 mg  10 mg Oral QHS Hooten, Illene LabradorJames P, MD      . multivitamin with minerals tablet 1 tablet  1 tablet Oral Daily Hooten, Illene LabradorJames P, MD      . olopatadine (PATANOL) 0.1 % ophthalmic solution 1 drop  1 drop Both Eyes  BID Hooten, Illene LabradorJames P, MD      . ondansetron (ZOFRAN) tablet 4 mg  4 mg Oral Q6H PRN Hooten, Illene LabradorJames P, MD  Or  . ondansetron (ZOFRAN) injection 4 mg  4 mg Intravenous Q6H PRN Hooten, Illene Labrador, MD      . oxyCODONE (Oxy IR/ROXICODONE) immediate release tablet 10 mg  10 mg Oral Q4H PRN Hooten, Illene Labrador, MD      . oxyCODONE (Oxy IR/ROXICODONE) immediate release tablet 5 mg  5 mg Oral Q4H PRN Hooten, Illene Labrador, MD      . pantoprazole (PROTONIX) EC tablet 40 mg  40 mg Oral BID Hooten, Illene Labrador, MD      . Melene Muller ON 03/26/2018] PARoxetine (PAXIL) tablet 20 mg  20 mg Oral Daily Hooten, Illene Labrador, MD      . senna-docusate (Senokot-S) tablet 1 tablet  1 tablet Oral BID Hooten, Illene Labrador, MD      . sodium phosphate (FLEET) 7-19 GM/118ML enema 1 enema  1 enema Rectal Once PRN Hooten, Illene Labrador, MD      . traMADol (ULTRAM) tablet 50-100 mg  50-100 mg Oral Q4H PRN Hooten, Illene Labrador, MD      . zolpidem (AMBIEN) tablet 5 mg  5 mg Oral QHS PRN Hooten, Illene Labrador, MD         Discharge Medications: Please see discharge summary for a list of discharge medications.  Relevant Imaging Results:  Relevant Lab Results:   Additional Information (SSN: [id number])  Papa [place], Darleen Crocker, LCSW

## 2018-03-25 NOTE — Evaluation (Signed)
Physical Therapy Evaluation Patient Details Name: Crystal Haas MRN: [id number] DOB: 07/06/1946 Today's Date: 03/25/2018   History of Present Illness  Pt admitted for R TKR.  Clinical Impression  Pt is a pleasant 72 year old female who was admitted for R TKR. Pt performs bed mobility with min assist, transfers with mod assist +2, and ambulation with min assist +2 and RW. Pt demonstrates deficits with strength/mobility/pain. Pt eager to work with therapy. Good endurance with ROM. Pt demonstrates ability to perform 10 SLRs with independence, therefore does not require KI for mobility. Currently not at baseline level. Would benefit from skilled PT to address above deficits and promote optimal return to PLOF; recommend transition to STR upon discharge from acute hospitalization.       Follow Up Recommendations SNF    Equipment Recommendations  None recommended by PT    Recommendations for Other Services       Precautions / Restrictions Precautions Precautions: Fall;Knee Precaution Booklet Issued: No Restrictions Weight Bearing Restrictions: Yes RLE Weight Bearing: Weight bearing as tolerated      Mobility  Bed Mobility Overal bed mobility: Needs Assistance Bed Mobility: Supine to Sit     Supine to sit: Min assist     General bed mobility comments: needs cues for sequencing. ONce seated at EOB, able to sit with upright posture.  Transfers Overall transfer level: Needs assistance Equipment used: Rolling walker (2 wheeled) Transfers: Sit to/from Stand Sit to Stand: +2 physical assistance;Mod assist         General transfer comment: transfers performed with RW and mod assist +2. ROcking performed for added momentum prior to transfer. Once standing, upright posture noted. NO dizziness noted  Ambulation/Gait Ambulation/Gait assistance: Min assist;+2 physical assistance Ambulation Distance (Feet): 3 Feet Assistive device: Rolling walker (2 wheeled) Gait Pattern/deviations:  Step-to pattern     General Gait Details: slow step to gait pattern performed with RW. INcreased pain with mobility, limited further exertion. Demonstrated proper use of RW prior to mobility efforts  Stairs            Wheelchair Mobility    Modified Rankin (Stroke Patients Only)       Balance Overall balance assessment: Needs assistance Sitting-balance support: Feet supported Sitting balance-Leahy Scale: Good     Standing balance support: Bilateral upper extremity supported Standing balance-Leahy Scale: Good                               Pertinent Vitals/Pain Pain Assessment: 0-10 Pain Score: 5  Pain Location: R knee Pain Descriptors / Indicators: Operative site guarding Pain Intervention(s): Limited activity within patient's tolerance;Ice applied;Repositioned;Patient requesting pain meds-RN notified;RN gave pain meds during session    Home Living Family/patient expects to be discharged to:: Private residence Living Arrangements: Alone   Type of Home: House Home Access: Stairs to enter Entrance Stairs-Rails: Left Entrance Stairs-Number of Steps: 4 Home Layout: One level Home Equipment: Walker - 2 wheels;Cane - single point      Prior Function Level of Independence: Independent         Comments: was indep prior to admission     Hand Dominance        Extremity/Trunk Assessment   Upper Extremity Assessment Upper Extremity Assessment: Overall WFL for tasks assessed    Lower Extremity Assessment Lower Extremity Assessment: Generalized weakness(R LE grossly 3/5 L LE grossly 4+/5)       Communication   Communication: No difficulties  Cognition Arousal/Alertness: Awake/alert Behavior During Therapy: WFL for tasks assessed/performed Overall Cognitive Status: Within Functional Limits for tasks assessed                                        General Comments      Exercises Total Joint Exercises Goniometric ROM: R  LE AAROM: 4-70 degrees Other Exercises Other Exercises: supine ther-ex performed on R LE including ankle pumps, quad sets, SLR, hip abd/add, and knee flexion stretches. ALl ther-ex performed x 10 reps with min assist and cues for sequencing.   Assessment/Plan    PT Assessment Patient needs continued PT services  PT Problem List Decreased strength;Decreased range of motion;Decreased balance;Decreased mobility;Decreased knowledge of use of DME;Pain       PT Treatment Interventions DME instruction;Gait training;Stair training    PT Goals (Current goals can be found in the Care Plan section)  Acute Rehab PT Goals Patient Stated Goal: to get stronger PT Goal Formulation: With patient Time For Goal Achievement: 04/08/18 Potential to Achieve Goals: Good    Frequency BID   Barriers to discharge        Co-evaluation               AM-PAC PT "6 Clicks" Daily Activity  Outcome Measure Difficulty turning over in bed (including adjusting bedclothes, sheets and blankets)?: Unable Difficulty moving from lying on back to sitting on the side of the bed? : Unable Difficulty sitting down on and standing up from a chair with arms (e.g., wheelchair, bedside commode, etc,.)?: Unable Help needed moving to and from a bed to chair (including a wheelchair)?: A Lot Help needed walking in hospital room?: A Lot Help needed climbing 3-5 steps with a railing? : Total 6 Click Score: 8    End of Session Equipment Utilized During Treatment: Gait belt Activity Tolerance: Patient tolerated treatment well Patient left: in chair;with bed alarm set;with SCD's reapplied Nurse Communication: Mobility status PT Visit Diagnosis: Muscle weakness (generalized) (M62.81);Difficulty in walking, not elsewhere classified (R26.2);Pain Pain - Right/Left: Right Pain - part of body: Knee    Time: [id number] PT Time Calculation (min) (ACUTE ONLY): 33 min   Charges:   PT Evaluation $PT Eval Low Complexity: 1  Low PT Treatments $Therapeutic Exercise: 8-22 mins   PT G Codes:        Elizabeth PalauStephanie Recia Sons, PT, DPT [phone]   Derreon Consalvo 03/25/2018, 5:06 PM

## 2018-03-25 NOTE — Transfer of Care (Signed)
Immediate Anesthesia Transfer of Care Note  Patient: Crystal LollDiane Leclere  Procedure(s) Performed: COMPUTER ASSISTED TOTAL KNEE ARTHROPLASTY (Right Knee)  Patient Location: PACU  Anesthesia Type:Spinal  Level of Consciousness: awake, alert , oriented and patient cooperative  Airway & Oxygen Therapy: Patient Spontanous Breathing and Patient connected to nasal cannula oxygen  Post-op Assessment: Report given to RN and Post -op Vital signs reviewed and stable  Post vital signs: Reviewed and stable  Last Vitals:  Vitals Value Taken Time  BP    Temp    Pulse 83 03/25/2018 11:02 AM  Resp 16 03/25/2018 11:02 AM  SpO2 99 % 03/25/2018 11:02 AM  Vitals shown include unvalidated device data.  Last Pain:  Vitals:   03/25/18 0616  TempSrc: Tympanic  PainSc: 0-No pain         Complications: No apparent anesthesia complications

## 2018-03-25 NOTE — H&P (Signed)
The patient has been re-examined, and the chart reviewed, and there have been no interval changes to the documented history and physical.    The risks, benefits, and alternatives have been discussed at length. The patient expressed understanding of the risks benefits and agreed with plans for surgical intervention.  James P. Hooten, Jr. M.D.    

## 2018-03-25 NOTE — Op Note (Signed)
OPERATIVE NOTE  DATE OF SURGERY:  03/25/2018  PATIENT NAME:  Crystal Haas   DOB: 06-Apr-1946  MRN: [id number]  PRE-OPERATIVE DIAGNOSIS: Degenerative arthrosis of the right knee, primary  POST-OPERATIVE DIAGNOSIS:  Same  PROCEDURE:  Right total knee arthroplasty using computer-assisted navigation  SURGEON:  Jena Gauss. M.D.  ANESTHESIA: spinal  ESTIMATED BLOOD LOSS: 50 mL  FLUIDS REPLACED: 1150 mL of crystalloid  TOURNIQUET TIME: 102 minutes  DRAINS: 2 medium Hemovac drains  SOFT TISSUE RELEASES: Anterior cruciate ligament, posterior cruciate ligament, deep medial collateral ligament, patellofemoral ligament  IMPLANTS UTILIZED: DePuy Attune size 4N posterior stabilized femoral component (cemented), size 3 rotating platform tibial component (cemented), 35 mm medialized dome patella (cemented), and a 5 mm stabilized rotating platform polyethylene insert.  INDICATIONS FOR SURGERY: Crystal Haas is a 72 y.o. year old female with a long history of progressive knee pain. X-rays demonstrated severe degenerative changes in tricompartmental fashion. The patient had not seen any significant improvement despite conservative nonsurgical intervention. After discussion of the risks and benefits of surgical intervention, the patient expressed understanding of the risks benefits and agree with plans for total knee arthroplasty.   The risks, benefits, and alternatives were discussed at length including but not limited to the risks of infection, bleeding, nerve injury, stiffness, blood clots, the need for revision surgery, cardiopulmonary complications, among others, and they were willing to proceed.  PROCEDURE IN DETAIL: The patient was brought into the operating room and, after adequate spinal anesthesia was achieved, a tourniquet was placed on the patient's upper thigh. The patient's knee and leg were cleaned and prepped with alcohol and DuraPrep and draped in the usual sterile fashion. A "timeout"  was performed as per usual protocol. The lower extremity was exsanguinated using an Esmarch, and the tourniquet was inflated to 300 mmHg. An anterior longitudinal incision was made followed by a standard mid vastus approach. The deep fibers of the medial collateral ligament were elevated in a subperiosteal fashion off of the medial flare of the tibia so as to maintain a continuous soft tissue sleeve. The patella was subluxed laterally and the patellofemoral ligament was incised. Inspection of the knee demonstrated severe degenerative changes with full-thickness loss of articular cartilage. Osteophytes were debrided using a rongeur. Anterior and posterior cruciate ligaments were excised. Two 4.0 mm Schanz pins were inserted in the femur and into the tibia for attachment of the array of trackers used for computer-assisted navigation. Hip center was identified using a circumduction technique. Distal landmarks were mapped using the computer. The distal femur and proximal tibia were mapped using the computer. The distal femoral cutting guide was positioned using computer-assisted navigation so as to achieve a 5 distal valgus cut. The femur was sized and it was felt that a size 4N femoral component was appropriate. A size 4 femoral cutting guide was positioned and the anterior cut was performed and verified using the computer. This was followed by completion of the posterior and chamfer cuts. Femoral cutting guide for the central box was then positioned in the center box cut was performed.  Attention was then directed to the proximal tibia. Medial and lateral menisci were excised. The extramedullary tibial cutting guide was positioned using computer-assisted navigation so as to achieve a 0 varus-valgus alignment and 3 posterior slope. The cut was performed and verified using the computer. The proximal tibia was sized and it was felt that a size 3 tibial tray was appropriate. Tibial and femoral trials were inserted  followed by insertion  of a 5 mm polyethylene insert. This allowed for excellent mediolateral soft tissue balancing both in flexion and in full extension. Finally, the patella was cut and prepared so as to accommodate a 35 mm medialized dome patella. A patella trial was placed and the knee was placed through a range of motion with excellent patellar tracking appreciated. The femoral trial was removed after debridement of posterior osteophytes. The central post-hole for the tibial component was reamed followed by insertion of a keel punch. Tibial trials were then removed. Cut surfaces of bone were irrigated with copious amounts of normal saline with antibiotic solution using pulsatile lavage and then suctioned dry. Polymethylmethacrylate cement with gentamicin was prepared in the usual fashion using a vacuum mixer. Cement was applied to the cut surface of the proximal tibia as well as along the undersurface of a size 3 rotating platform tibial component. Tibial component was positioned and impacted into place. Excess cement was removed using Personal assistantreer elevators. Cement was then applied to the cut surfaces of the femur as well as along the posterior flanges of the size 4N femoral component. The femoral component was positioned and impacted into place. Excess cement was removed using Personal assistantreer elevators. A 5 mm polyethylene trial was inserted and the knee was brought into full extension with steady axial compression applied. Finally, cement was applied to the backside of a 35 mm medialized dome patella and the patellar component was positioned and patellar clamp applied. Excess cement was removed using Personal assistantreer elevators. After adequate curing of the cement, the tourniquet was deflated after a total tourniquet time of 102 minutes. Hemostasis was achieved using electrocautery. The knee was irrigated with copious amounts of normal saline with antibiotic solution using pulsatile lavage and then suctioned dry. 20 mL of 1.3% Exparel  and 60 mL of 0.25% Marcaine in 40 mL of normal saline was injected along the posterior capsule, medial and lateral gutters, and along the arthrotomy site. A 5 mm stabilized rotating platform polyethylene insert was inserted and the knee was placed through a range of motion with excellent mediolateral soft tissue balancing appreciated and excellent patellar tracking noted. 2 medium drains were placed in the wound bed and brought out through separate stab incisions. The medial parapatellar portion of the incision was reapproximated using interrupted sutures of #1 Vicryl. Subcutaneous tissue was approximated in layers using first #0 Vicryl followed #2-0 Vicryl. The skin was approximated with skin staples. A sterile dressing was applied.  The patient tolerated the procedure well and was transported to the recovery room in stable condition.    Jaelen Soth P. Angie FavaHooten, Jr., M.D.

## 2018-03-25 NOTE — Anesthesia Preprocedure Evaluation (Signed)
Anesthesia Evaluation  Patient identified by MRN, date of birth, ID band Patient awake    Reviewed: Allergy & Precautions, NPO status , Patient's Chart, lab work & pertinent test results  History of Anesthesia Complications Negative for: history of anesthetic complications  Airway Mallampati: III       Dental   Pulmonary sleep apnea and Continuous Positive Airway Pressure Ventilation , former smoker,           Cardiovascular hypertension (off meds since quitting job), (-) Past MI and (-) CHF + dysrhythmias Atrial Fibrillation + Valvular Problems/Murmurs      Neuro/Psych neg Seizures Anxiety Depression    GI/Hepatic Neg liver ROS, GERD  ,  Endo/Other  neg diabetes  Renal/GU negative Renal ROS     Musculoskeletal   Abdominal   Peds  Hematology  (+) anemia ,   Anesthesia Other Findings   Reproductive/Obstetrics                             Anesthesia Physical Anesthesia Plan  ASA: III  Anesthesia Plan: Spinal   Post-op Pain Management:    Induction:   PONV Risk Score and Plan:   Airway Management Planned:   Additional Equipment:   Intra-op Plan:   Post-operative Plan:   Informed Consent: I have reviewed the patients History and Physical, chart, labs and discussed the procedure including the risks, benefits and alternatives for the proposed anesthesia with the patient or authorized representative who has indicated his/her understanding and acceptance.     Plan Discussed with:   Anesthesia Plan Comments:         Anesthesia Quick Evaluation

## 2018-03-25 NOTE — Anesthesia Procedure Notes (Signed)
Spinal  Patient location during procedure: OR Start time: 03/25/2018 7:23 AM End time: 03/25/2018 7:28 AM Staffing Resident/CRNA: Bernardo Heater, CRNA Performed: resident/CRNA  Preanesthetic Checklist Completed: patient identified, site marked, surgical consent, pre-op evaluation, timeout performed, IV checked, risks and benefits discussed and monitors and equipment checked Spinal Block Patient position: sitting Prep: ChloraPrep Patient monitoring: heart rate, continuous pulse ox, blood pressure and cardiac monitor Approach: midline Location: L4-5 Injection technique: single-shot Needle Needle type: Introducer and Pencan  Needle gauge: 24 G Needle length: 9 cm Additional Notes Negative paresthesia. Negative blood return. Positive free-flowing CSF. Expiration date of kit checked and confirmed. Patient tolerated procedure well, without complications.

## 2018-03-25 NOTE — Progress Notes (Signed)
ADMISSION NOTE:  Pt admitted to room 147 from PACU. Pt alert and oriented X4. Surgical dressing clean dry and intact. Hemovac  and foley in place. Polar care foot pump and TEDS on. Friend at the bedside. Pts home CPAP at the bedside. Bed in lowest position call bell in reach and bed alarm on.

## 2018-03-25 NOTE — Anesthesia Post-op Follow-up Note (Signed)
Anesthesia QCDR form completed.        

## 2018-03-26 NOTE — Clinical Social Work Placement (Signed)
   CLINICAL SOCIAL WORK PLACEMENT  NOTE  Date:  03/26/2018  Patient Details  Name: Crystal Haas MRN: [id number] Date of Birth: 03/31/1946  Clinical Social Work is seeking post-discharge placement for this patient at the Skilled  Nursing Facility level of care (*CSW will initial, date and re-position this form in  chart as items are completed):  Yes   Patient/family provided with [facility] Clinical Social Work Department's list of facilities offering this level of care within the geographic area requested by the patient (or if unable, by the patient's family).  Yes   Patient/family informed of their freedom to choose among providers that offer the needed level of care, that participate in Medicare, Medicaid or managed care program needed by the patient, have an available bed and are willing to accept the patient.  Yes   Patient/family informed of 's ownership interest in [facility] and [facility], as well as of the fact that they are under no obligation to receive care at these facilities.  PASRR submitted to EDS on 03/25/18     PASRR number received on 03/25/18     Existing PASRR number confirmed on       FL2 transmitted to all facilities in geographic area requested by pt/family on 03/26/18     FL2 transmitted to all facilities within larger geographic area on       Patient informed that his/her managed care company has contracts with or will negotiate with certain facilities, including the following:        Yes   Patient/family informed of bed offers received.  Patient chooses bed at ([facility] )     Physician recommends and patient chooses bed at      Patient to be transferred to   on  .  Patient to be transferred to facility by       Patient family notified on   of transfer.  Name of family member notified:        PHYSICIAN       Additional Comment:    _______________________________________________ Meliah Appleman, Darleen CrockerBailey M, LCSW 03/26/2018, 11:36 AM

## 2018-03-26 NOTE — Progress Notes (Addendum)
Physical Therapy Treatment Patient Details Name: Crystal LollDiane Haas MRN: [id number] DOB: 04/29/1946 Today's Date: 03/26/2018    History of Present Illness Pt admitted for R TKR.    PT Comments    Pt continues to progress towards her goals. Pt able to perform ambulation and transfers with min assist and continued verbal cuing for safety. Pt demonstrates improvements through increased ambulation distance, less attempts needed to complete transfers. Pt however still becomes light headed and dizzy after transfers possibly due to holding her breath during. Pt demonstrating improvements with HEP however could benefit from continued skilled therapy due to the continued need for guarding and cuing for safety. PT will continue to see pt BID until discharge. SNF continues to be PTs recommendation for discharge at this time.     Follow Up Recommendations  SNF     Equipment Recommendations  None recommended by PT    Recommendations for Other Services       Precautions / Restrictions Precautions Precautions: Fall;Knee Precaution Booklet Issued: Yes (comment) Restrictions Weight Bearing Restrictions: Yes RLE Weight Bearing: Weight bearing as tolerated    Mobility  Bed Mobility Overal bed mobility: Needs Assistance Bed Mobility: Sit to Supine;Supine to Sit     Supine to sit: Min assist Sit to supine: Min assist   General bed mobility comments: Min A for RLE mgt  Transfers Overall transfer level: Needs assistance Equipment used: Rolling walker (2 wheeled) Transfers: Sit to/from Stand Sit to Stand: Min assist         General transfer comment: pt required verbal cuing and min assist during transfers for safety. pt able to sit<>stand on first attempt indicicating improving performace.  Ambulation/Gait Ambulation/Gait assistance: Min assist Ambulation Distance (Feet): 20 Feet Assistive device: Rolling walker (2 wheeled) Gait Pattern/deviations: Step-to pattern     General Gait  Details: pt demonstrates step to gait pattern with a slowed gait speed. pt subjectively reports being fearful to accept weight on R knee during stance phase. required min assist and cuing to assist RW in hospital room   Stairs             Wheelchair Mobility    Modified Rankin (Stroke Patients Only)       Balance Overall balance assessment: Needs assistance Sitting-balance support: Feet supported Sitting balance-Leahy Scale: Good     Standing balance support: Bilateral upper extremity supported Standing balance-Leahy Scale: Good                              Cognition Arousal/Alertness: Awake/alert Behavior During Therapy: WFL for tasks assessed/performed Overall Cognitive Status: Within Functional Limits for tasks assessed                                        Exercises Other Exercises Other Exercises: Seated and supine ther ex to R LE X10 reps heel slides sitting EOB, supine ankle pumps, quad sets, glut set, SLR, LAQ Other Exercises: Pt assisted in toileting at beginning of session, required min assist and verbal/tactile cuing to stand pivot transfer from chair to [facility].     General Comments        Pertinent Vitals/Pain Pain Assessment: 0-10 Pain Score: 2  Pain Location: R knee Pain Descriptors / Indicators: Aching Pain Intervention(s): Limited activity within patient's tolerance;Monitored during session;Ice applied;Premedicated before session    Home Living Family/patient expects to  be discharged to:: Private residence Living Arrangements: Alone Available Help at Discharge: Family;Available PRN/intermittently Type of Home: House Home Access: Stairs to enter Entrance Stairs-Rails: Left Home Layout: One level Home Equipment: Walker - 2 wheels;Cane - single point;Grab bars - toilet;Grab bars - tub/shower;Adaptive equipment      Prior Function Level of Independence: Independent      Comments: Pt was indep prior to admission,  however knee pain significantly limited pt'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 : Unable Difficulty sitting down on and standing up from a chair with arms (e.g., wheelchair, bedside commode, etc,.)?: Unable Help needed moving to and from a bed to chair (including a wheelchair)?: A Little Help needed walking in hospital room?: A Lot Help needed climbing 3-5 steps with a railing? : Total 6 Click Score: 10    End of Session Equipment Utilized During Treatment: Gait belt Activity Tolerance: Patient tolerated treatment well Patient left: in bed;with call bell/phone within reach;with bed alarm set;with SCD's reapplied Nurse Communication: Other (comment)(pt requesting pain meds) PT Visit Diagnosis: Muscle weakness (generalized) (M62.81);Difficulty in walking, not elsewhere classified (R26.2);Pain Pain - Right/Left: Right Pain - part of body: Knee     Time: 1319-1350 PT Time Calculation (min) (ACUTE ONLY): 31 min  Charges:  $Gait Training: 8-22 mins $Therapeutic Exercise: 8-22 mins                    G Codes:      [employer], SPT    Kule Gascoigne 03/26/2018, 4:49 PM

## 2018-03-26 NOTE — Evaluation (Signed)
Occupational Therapy Evaluation Patient Details Name: Crystal Haas MRN: [id number] DOB: September 06, 1946 Today's Date: 03/26/2018    History of Present Illness Pt admitted for R TKR.   Clinical Impression   Pt seen for OT evaluation this date, POD#1 from above surgery. Pt was independent in all ADLs prior to surgery, however states "I had to give up life in general" secondary to knee pain. Pt really enjoys ballroom dancing and has been unable to participate in this meaningful occupation for over 1 yr due to knee pain. Pt is eager to return to PLOF with less pain and improved safety and independence so she can return to participating in meaningful occupations. Pt currently requires minimal assist for all mobility and for LB bathing and dressing while in seated position due to pain and limited AROM of R knee. >23 minutes aside from evaluation spent providing pt with instruction/treaining in polar care mgt, falls prevention strategies, home/routines modifications, bladder mgt/toileting schedule, DME/AE for LB bathing and dressing tasks, compression stocking mgt, and fxl mobility training within kitchen environment. Pt would benefit from skilled OT services including additional instruction in dressing techniques with or without assistive devices for dressing and bathing skills to support recall and carryover prior to discharge and ultimately to maximize safety, independence, and minimize falls risk and caregiver burden. Recommend STR following discharge from the hospital.      Follow Up Recommendations  SNF    Equipment Recommendations  None recommended by OT(pt plans to borrow [facility] from friend)    Recommendations for Other Services       Precautions / Restrictions Precautions Precautions: Fall;Knee Precaution Booklet Issued: Yes (comment) Restrictions Weight Bearing Restrictions: Yes RLE Weight Bearing: Weight bearing as tolerated      Mobility Bed Mobility Overal bed mobility: Needs  Assistance Bed Mobility: Sit to Supine;Supine to Sit     Supine to sit: Min assist Sit to supine: Min assist   General bed mobility comments: Min A for RLE mgt  Transfers Overall transfer level: Needs assistance Equipment used: Rolling walker (2 wheeled) Transfers: Sit to/from Stand Sit to Stand: Min assist         General transfer comment: pt required verbal cuing and CGA assist during transfers for safety. pt able to sit<>stand on first attempt indicicating improving performace.    Balance Overall balance assessment: Needs assistance Sitting-balance support: Feet supported Sitting balance-Leahy Scale: Good     Standing balance support: Bilateral upper extremity supported Standing balance-Leahy Scale: Good                             ADL either performed or assessed with clinical judgement   ADL Overall ADL's : Needs assistance/impaired Eating/Feeding: Sitting;Independent   Grooming: Sitting;Independent   Upper Body Bathing: Sitting;Supervision/ safety   Lower Body Bathing: Sit to/from stand;Minimal assistance;Moderate assistance   Upper Body Dressing : Sitting;Independent   Lower Body Dressing: Sit to/from stand;Moderate assistance;Minimal assistance Lower Body Dressing Details (indicate cue type and reason): instructed in use of AE for LB dressing tasks and compression stocking mgt strategies Toilet Transfer: RW;Minimal assistance;Ambulation;BSC           Functional mobility during ADLs: Minimal assistance;Rolling walker       Vision Baseline Vision/History: Wears glasses Wears Glasses: Reading only Patient Visual Report: No change from baseline       Perception     Praxis      Pertinent Vitals/Pain Pain Assessment: 0-10 Pain Score: 2  Pain Location: R knee Pain Descriptors / Indicators: Aching Pain Intervention(s): Limited activity within patient's tolerance;Monitored during session;Ice applied;Premedicated before session      Hand Dominance Right   Extremity/Trunk Assessment Upper Extremity Assessment Upper Extremity Assessment: Overall WFL for tasks assessed   Lower Extremity Assessment Lower Extremity Assessment: Defer to PT evaluation;Generalized weakness   Cervical / Trunk Assessment Cervical / Trunk Assessment: Normal   Communication Communication Communication: No difficulties   Cognition Arousal/Alertness: Awake/alert Behavior During Therapy: WFL for tasks assessed/performed Overall Cognitive Status: Within Functional Limits for tasks assessed                                     General Comments       Exercises Other Exercises Other Exercises: Pt educated in home/routines modifications, falls prevention strategies, and polar care mgt Other Exercises: Pt educated in functional mobility w/ RW w/in context of kitchen activities Other Exercises: Pt educated in toileting schedule and strategies to support bladder mgt while preventing need to rush and increase falls risk   Shoulder Instructions      Home Living Family/patient expects to be discharged to:: Private residence Living Arrangements: Alone Available Help at Discharge: Family;Available PRN/intermittently Type of Home: House Home Access: Stairs to enter [employer] CorporationEntrance Stairs-Number of Steps: 4 Entrance Stairs-Rails: Left Home Layout: One level     Bathroom Shower/Tub: Chief Strategy OfficerTub/shower unit   Bathroom Toilet: Handicapped height     Home Equipment: Environmental consultantWalker - 2 wheels;Cane - single point;Grab bars - toilet;Grab bars - tub/shower;Adaptive equipment Adaptive Equipment: Reacher        Prior Functioning/Environment Level of Independence: Independent        Comments: Pt was indep prior to admission, however knee pain significantly limited pt's ability to participate in meaningful occupations including ballroom dancing (hasn't been able to in >5656yr). 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)         OT Problem List: Decreased strength;Decreased knowledge of use of DME or AE;Decreased range of motion;Pain      OT Treatment/Interventions: Self-care/ADL training;Therapeutic exercise;Therapeutic activities;DME and/or AE instruction;Patient/family education    OT Goals(Current goals can be found in the care plan section) Acute Rehab OT Goals Patient Stated Goal: to get stronger OT Goal Formulation: With patient Time For Goal Achievement: 04/09/18 Potential to Achieve Goals: Good ADL Goals Pt Will Perform Lower Body Dressing: with adaptive equipment;with min guard assist;sit to/from stand(CGA in standing to complete dressing over hips) Pt Will Transfer to Toilet: with min guard assist;ambulating(BSC frame over commode, RW for amb)  OT Frequency: Min 1X/week   Barriers to D/C:            Co-evaluation              AM-PAC PT "6 Clicks" Daily Activity     Outcome Measure Help from another person eating meals?: None Help from another person taking care of personal grooming?: None Help from another person toileting, which includes using toliet, bedpan, or urinal?: A Little Help from another person bathing (including washing, rinsing, drying)?: A Little Help from another person to put on and taking off regular upper body clothing?: A Little Help from another person to put on and taking off regular lower body clothing?: A Little 6 Click Score: 20   End of Session    Activity Tolerance: Patient tolerated treatment well Patient left: in bed;with call bell/phone within reach;with  bed alarm set;with SCD's reapplied;Other (comment)(polar care in place)  OT Visit Diagnosis: Other abnormalities of gait and mobility (R26.89);Muscle weakness (generalized) (M62.81);Pain Pain - Right/Left: Right Pain - part of body: Knee                Time: 1610-9604 OT Time Calculation (min): 32 min Charges:  OT General Charges $OT Visit: 1 Visit OT Evaluation $OT Eval Low Complexity: 1  Low OT Treatments $Self Care/Home Management : 23-37 mins  Richrd Prime, MPH, MS, OTR/L ascom [phone] 03/26/18, 3:57 PM

## 2018-03-26 NOTE — Progress Notes (Signed)
Physical Therapy Treatment Patient Details Name: Crystal LollDiane Huneke MRN: [id number] DOB: 05/16/1946 Today's Date: 03/26/2018    History of Present Illness Pt admitted for R TKR.    PT Comments    Pt continues to progress towards her goals. She required min +1 assistance and RW for transfers and ambulation of 10'. Pt requires continuous cuing during HEP for breathing and technique. Pt is eager to continue therapy. AAROM improving 2-76. Pt pain continues to be low when controlled by meds. Pt fatigued quickly during session especially during ambulation. Pt could benefit from continued skilled therapy to maintain safety while increasing independence toward PLOF. PT will continue to work with pt while admitted. D/c recommendation continues to be SNF.   Follow Up Recommendations  SNF     Equipment Recommendations  None recommended by PT    Recommendations for Other Services       Precautions / Restrictions Precautions Precautions: Fall;Knee Precaution Booklet Issued: Yes (comment) Restrictions Weight Bearing Restrictions: Yes RLE Weight Bearing: Weight bearing as tolerated    Mobility  Bed Mobility Overal bed mobility: Needs Assistance Bed Mobility: Supine to Sit     Supine to sit: Min assist     General bed mobility comments: patient required cues for breathing throughout movements, complained of slight lightheadedness after movements O2 sats WNL, dizziness resolved quickly once at EOB  Transfers Overall transfer level: Needs assistance Equipment used: Rolling walker (2 wheeled) Transfers: Sit to/from Stand Sit to Stand: Min assist         General transfer comment: verbal cuing prior to transfer for safety and sequencing  Ambulation/Gait Ambulation/Gait assistance: Min assist Ambulation Distance (Feet): 10 Feet Assistive device: Rolling walker (2 wheeled) Gait Pattern/deviations: Step-to pattern     General Gait Details: pt demonstrates slow step to gait pattern however  states that she does not have pain in operative knee while bearing weight for the first time in 2 years. pt became quickly fatigued during ambulation. required cuing for safety of RW.   Stairs             Wheelchair Mobility    Modified Rankin (Stroke Patients Only)       Balance                                            Cognition Arousal/Alertness: Awake/alert Behavior During Therapy: WFL for tasks assessed/performed Overall Cognitive Status: Within Functional Limits for tasks assessed                                        Exercises Total Joint Exercises Goniometric ROM: R knee AAROM 2-76 Other Exercises Other Exercises: supine ther-ex to R LE: ankle pumps, quad sets, SLR, glut sets, abduction x10 with guarding by PT.  once seated x10 heel slides    General Comments        Pertinent Vitals/Pain Pain Assessment: 0-10 Pain Score: 4  Pain Location: R knee Pain Descriptors / Indicators: Operative site guarding(posterior knee) Pain Intervention(s): Limited activity within patient's tolerance;Monitored during session;Patient requesting pain meds-RN notified;Ice applied    Home Living                      Prior Function  PT Goals (current goals can now be found in the care plan section) Acute Rehab PT Goals Patient Stated Goal: to get stronger PT Goal Formulation: With patient Time For Goal Achievement: 04/08/18 Potential to Achieve Goals: Good Progress towards PT goals: Progressing toward goals    Frequency    BID      PT Plan Current plan remains appropriate    Co-evaluation              AM-PAC PT "6 Clicks" Daily Activity  Outcome Measure  Difficulty turning over in bed (including adjusting bedclothes, sheets and blankets)?: Unable Difficulty moving from lying on back to sitting on the side of the bed? : Unable Difficulty sitting down on and standing up from a chair with arms (e.g.,  wheelchair, bedside commode, etc,.)?: Unable Help needed moving to and from a bed to chair (including a wheelchair)?: A Little Help needed walking in hospital room?: A Lot Help needed climbing 3-5 steps with a railing? : Total 6 Click Score: 9    End of Session Equipment Utilized During Treatment: Gait belt Activity Tolerance: Patient tolerated treatment well Patient left: in chair;with chair alarm set;with SCD's reapplied Nurse Communication: Other (comment)(pt request pain meds) PT Visit Diagnosis: Muscle weakness (generalized) (M62.81);Difficulty in walking, not elsewhere classified (R26.2);Pain Pain - Right/Left: Right Pain - part of body: Knee     Time: 8119-1478 PT Time Calculation (min) (ACUTE ONLY): 43 min  Charges:  $Gait Training: 8-22 mins $Therapeutic Exercise: 23-37 mins                    G Codes:       Brevyn Ring, SPT    Kasem Mozer 03/26/2018, 11:49 AM

## 2018-03-26 NOTE — Clinical Social Work Note (Signed)
Clinical Social Work Assessment  Patient Details  Name: Crystal Haas MRN: [id number] Date of Birth: [birth year]  Date of referral:  03/26/18               Reason for consult:  Facility Placement                Permission sought to share information with:  Chartered certified accountant granted to share information::  Yes, Verbal Permission Granted  Name::      [place]::   [place]   Relationship::     Contact Information:     Housing/Transportation Living arrangements for the past 2 months:  [place] of Information:  Patient Patient Interpreter Needed:  None Criminal Activity/Legal Involvement Pertinent to Current Situation/Hospitalization:  No - Comment as needed Significant Relationships:  Adult Children Lives with:  Self Do you feel safe going back to the place where you live?  Yes Need for family participation in patient care:  Yes (Comment)  Care giving concerns: Patient lives alone in [place] alone.    Social Worker assessment / plan: Holiday representative (CSW) received SNF consult. PT is recommending SNF. CSW met with patient alone at bedside to discuss D/C plan. Patient was alert and oriented X4 and was laying in the bed. CSW introduced self and explained role of CSW department. Patient reported that she lives alone in [place] and her son Legrand Como is her HPOA. CSW explained SNF process and that [facility] will have to approve it. Patient is agreeable to SNF search in [facility] and prefers [facility]. Per patient she went to [facility] prior to surgery and toured the facility. FL2 complete and faxed out.   CSW presented bed offers to patient and she chose [facility]. Per Alger Simons liaison he will start [facility] authorization today. CSW faxed H&P to [facility]. Patient has her own c-pap at bedside that will go with her to SNF. CSW will continue to follow and assist as needed.   Employment status:  Retired Programmer, applications PT Recommendations:  [employer] / Referral to community resources:  [place]  Patient/Family's Response to care:  Patient accepted bed offer from [facility].   Patient/Family's Understanding of and Emotional Response to Diagnosis, Current Treatment, and Prognosis:  Patient was very pleasant and thanked CSW for assistance.   Emotional Assessment Appearance:  Appears stated age Attitude/Demeanor/Rapport:    Affect (typically observed):  Accepting, Adaptable, Pleasant Orientation:  Oriented to Self, Oriented to Place, Oriented to  Time, Oriented to Situation Alcohol / Substance use:  Not Applicable Psych involvement (Current and /or in the community):  No (Comment)  Discharge Needs  Concerns to be addressed:  Discharge Planning Concerns Readmission within the last 30 days:  No Current discharge risk:  Dependent with Mobility Barriers to Discharge:  Continued Medical Work up   [employer], [name], LCSW 03/26/2018, 11:38 AM

## 2018-03-26 NOTE — Progress Notes (Signed)
OT Cancellation Note  Patient Details Name: Crystal Haas MRN: [id number] DOB: 09/09/1946   Cancelled Treatment:    Reason Eval/Treat Not Completed: Other (comment). Order received, chart reviewed. Pt working with PT. Will re-attempt OT evaluation later this date.   Richrd PrimeJamie Stiller, MPH, MS, OTR/L ascom [phone] 03/26/18, 1:31 PM

## 2018-03-26 NOTE — Anesthesia Postprocedure Evaluation (Signed)
Anesthesia Post Note  Patient: [facility]  Procedure(s) Performed: COMPUTER ASSISTED TOTAL KNEE ARTHROPLASTY (Right Knee)  Patient location during evaluation: Nursing Unit Anesthesia Type: Spinal Level of consciousness: awake, awake and alert and oriented Pain management: pain level controlled Vital Signs Assessment: post-procedure vital signs reviewed and stable Respiratory status: spontaneous breathing, nonlabored ventilation and respiratory function stable Cardiovascular status: stable Anesthetic complications: no     Last Vitals:  Vitals:   03/25/18 2345 03/26/18 0629  BP: (!) 162/81 (!) 160/78  Pulse: 79 72  Resp: 16 18  Temp: 37.1 C 36.5 C  SpO2: 93% 98%    Last Pain:  Vitals:   03/26/18 0641  TempSrc:   PainSc: 7                  Microsofthuy Tamesha Ellerbrock

## 2018-03-26 NOTE — Progress Notes (Signed)
  Subjective: 1 Day Post-Op Procedure(s) (LRB): COMPUTER ASSISTED TOTAL KNEE ARTHROPLASTY (Right) Patient reports pain as mild.   Patient seen in rounds with Dr. Ernest PineHooten. Patient is well, and has had no acute complaints or problems Plan is to go Rehab after hospital stay. Negative for chest pain and shortness of breath Fever: no Gastrointestinal: Negative for nausea and vomiting  Objective: Vital signs in last 24 hours: Temp:  [96 F (35.6 C)-98.7 F (37.1 C)] 97.7 F (36.5 C) (06/06 0629) Pulse Rate:  [66-97] 72 (06/06 0629) Resp:  [12-18] 18 (06/06 0629) BP: (114-169)/(71-103) 160/78 (06/06 0629) SpO2:  [93 %-100 %] 98 % (06/06 0629)  Intake/Output from previous day:  Intake/Output Summary (Last 24 hours) at 03/26/2018 0643 Last data filed at 03/26/2018 0630 Gross per 24 hour  Intake 2008.33 ml  Output 4755 ml  Net -2746.67 ml    Intake/Output this shift: Total I/O In: -  Out: 1600 [Urine:1450; Drains:150]  Labs: No results for input(s): HGB in the last 72 hours. No results for input(s): WBC, RBC, HCT, PLT in the last 72 hours. No results for input(s): NA, K, CL, CO2, BUN, CREATININE, GLUCOSE, CALCIUM in the last 72 hours. No results for input(s): LABPT, INR in the last 72 hours.   EXAM General - Patient is Alert and Oriented Extremity - Neurovascular intact Dorsiflexion/Plantar flexion intact Compartment soft Dressing/Incision - clean, dry, no drainage Motor Function - intact, moving foot and toes well on exam.  Able to do a straight leg raise.  Past Medical History:  Diagnosis Date  . Anemia    distant past  . Anxiety   . Arthritis    "everywhere" - big toes worst  . Chronic atrial fibrillation (HCC)    a. on eliquis; b. CHADS2VASc at least 2 (age x 1, female)  . Depression   . GERD (gastroesophageal reflux disease)    RARE  . Heart murmur    mild - followed by PCP  . Knee pain   . Motion sickness    back seat of car  . OSA on CPAP    CPAP-4 PSI  .  Seasonal allergies    takes allergy weekly    Assessment/Plan: 1 Day Post-Op Procedure(s) (LRB): COMPUTER ASSISTED TOTAL KNEE ARTHROPLASTY (Right) Active Problems:   S/P total knee arthroplasty  Estimated body mass index is 41.07 kg/m as calculated from the following:   Height as of this encounter: 5' 4.5" (1.638 m).   Weight as of this encounter: 110.2 kg (243 lb). Advance diet Up with therapy D/C IV fluids Plan for [facility]  DVT Prophylaxis - Foot Pumps, TED hose and Eliquis Weight-Bearing as tolerated to right leg  Dedra Skeensodd Jazelle Achey, PA-C Orthopaedic Surgery 03/26/2018, 6:43 AM

## 2018-03-27 DIAGNOSIS — Z96651 Presence of right artificial knee joint: Secondary | ICD-10-CM | POA: Diagnosis not present

## 2018-03-27 DIAGNOSIS — M1990 Unspecified osteoarthritis, unspecified site: Secondary | ICD-10-CM | POA: Diagnosis not present

## 2018-03-27 DIAGNOSIS — R69 Illness, unspecified: Secondary | ICD-10-CM | POA: Diagnosis not present

## 2018-03-27 DIAGNOSIS — J302 Other seasonal allergic rhinitis: Secondary | ICD-10-CM | POA: Diagnosis not present

## 2018-03-27 DIAGNOSIS — G4700 Insomnia, unspecified: Secondary | ICD-10-CM | POA: Diagnosis not present

## 2018-03-27 DIAGNOSIS — E785 Hyperlipidemia, unspecified: Secondary | ICD-10-CM | POA: Diagnosis not present

## 2018-03-27 DIAGNOSIS — Z4789 Encounter for other orthopedic aftercare: Secondary | ICD-10-CM | POA: Diagnosis not present

## 2018-03-27 DIAGNOSIS — R262 Difficulty in walking, not elsewhere classified: Secondary | ICD-10-CM | POA: Diagnosis not present

## 2018-03-27 DIAGNOSIS — I10 Essential (primary) hypertension: Secondary | ICD-10-CM | POA: Diagnosis not present

## 2018-03-27 DIAGNOSIS — E569 Vitamin deficiency, unspecified: Secondary | ICD-10-CM | POA: Diagnosis not present

## 2018-03-27 MED ORDER — TRAMADOL HCL 50 MG PO TABS: 50.0000 mg | ORAL_TABLET | ORAL | 1 refills | Status: DC | PRN

## 2018-03-27 MED ORDER — OXYCODONE HCL 5 MG PO TABS: 5.0000 mg | ORAL_TABLET | ORAL | 0 refills | Status: DC | PRN

## 2018-03-27 NOTE — Progress Notes (Addendum)
Physical Therapy Treatment Patient Details Name: Crystal LollDiane Haas MRN: [id number] DOB: 08/19/1946 Today's Date: 03/27/2018    History of Present Illness Pt admitted for R TKR.    PT Comments    Pt continues to progress towards her goals. Pt able to perform ambulation and transfers with min assist and continued verbal cuing for safety. Pt demonstrates improvements through increased ambulation distance, less attempts needed to complete transfers. Pt however still becomes light headed and dizzy after transfers possibly due to holding her breath during. Pt demonstrating improvements with HEP however could benefit from continued skilled therapy due to the continued need for guarding and cuing for safety. At this time patient is still extremely limited due to fatigue. Pt needs continued assistance with transfers and mobility for safety. Pt very fearful to accept weight on R LE during amb and could benefit from more extensive gait training. PT will continue to see pt BID until discharge. SNF continues to be PTs recommendation for discharge at this time.   Follow Up Recommendations  SNF     Equipment Recommendations  None recommended by PT    Recommendations for Other Services       Precautions / Restrictions Precautions Precautions: Fall;Knee Precaution Booklet Issued: Yes (comment) Restrictions Weight Bearing Restrictions: Yes RLE Weight Bearing: Weight bearing as tolerated    Mobility  Bed Mobility Overal bed mobility: Needs Assistance Bed Mobility: Supine to Sit     Supine to sit: Min assist     General bed mobility comments: Min A for RLE management, patient states that she becomes light headed during movements possibly due to holding her breath however quickly subsides with rest. pt requires cuing for sequencing to complete movement safely  Transfers Overall transfer level: Needs assistance Equipment used: Rolling walker (2 wheeled) Transfers: Sit to/from Stand Sit to Stand: Min  assist         General transfer comment: pt requires min assist +1 and cuing for sequencing during transfers  Ambulation/Gait Ambulation/Gait assistance: Min assist(chair follow) Ambulation Distance (Feet): 60 Feet Assistive device: Rolling walker (2 wheeled) Gait Pattern/deviations: Decreased step length - right;Decreased step length - left;Shuffle;Step-to pattern     General Gait Details: patient demonstrates decreased stride length and a step to gait pattern. patient states that she is fearful to accept weight on R leg. patients requires cuing for safe DME use in enviornment and cuing to look ahead instead of at feet during gait. pt requires seated rest break and chair follow throughout ambulation due to fatiguing quickly and for safety.   Stairs             Wheelchair Mobility    Modified Rankin (Stroke Patients Only)       Balance                                            Cognition Arousal/Alertness: Awake/alert Behavior During Therapy: WFL for tasks assessed/performed Overall Cognitive Status: Within Functional Limits for tasks assessed                                        Exercises Total Joint Exercises Goniometric ROM: R knee AAROM 0-94 Other Exercises Other Exercises: Seated and supine ther ex to R LE X15 reps heel slides, hip abd, sitting EOB, supine ankle  pumps, quad sets, glut set, SLR and X10 reps LAQ. Requires min assist and cuing for safety.    General Comments        Pertinent Vitals/Pain Pain Assessment: 0-10 Pain Score: 7  Pain Location: R posterior knee Pain Descriptors / Indicators: Aching;Operative site guarding Pain Intervention(s): Limited activity within patient's tolerance;Monitored during session;Ice applied;Premedicated before session    Home Living                      Prior Function            PT Goals (current goals can now be found in the care plan section) Acute Rehab PT  Goals Patient Stated Goal: to get stronger PT Goal Formulation: With patient Time For Goal Achievement: 04/08/18 Potential to Achieve Goals: Good    Frequency    BID      PT Plan Current plan remains appropriate    Co-evaluation              AM-PAC PT "6 Clicks" Daily Activity  Outcome Measure  Difficulty turning over in bed (including adjusting bedclothes, sheets and blankets)?: A Little Difficulty moving from lying on back to sitting on the side of the bed? : Unable Difficulty sitting down on and standing up from a chair with arms (e.g., wheelchair, bedside commode, etc,.)?: Unable Help needed moving to and from a bed to chair (including a wheelchair)?: A Little Help needed walking in hospital room?: A Lot Help needed climbing 3-5 steps with a railing? : Total 6 Click Score: 11    End of Session Equipment Utilized During Treatment: Gait belt Activity Tolerance: Patient tolerated treatment well Patient left: in chair;with call bell/phone within reach;with chair alarm set Nurse Communication: Other (comment)(discharge planning, in chair) PT Visit Diagnosis: Muscle weakness (generalized) (M62.81);Difficulty in walking, not elsewhere classified (R26.2);Pain Pain - Right/Left: Right Pain - part of body: Knee     Time: 4098-1191 PT Time Calculation (min) (ACUTE ONLY): 43 min  Charges:  $Gait Training: 23-37 mins $Therapeutic Exercise: 8-22 mins                    G Codes:       Nyeema Want, SPT    Greenly Rarick 03/27/2018, 10:37 AM

## 2018-03-27 NOTE — Progress Notes (Signed)
  Subjective: 2 Days Post-Op Procedure(s) (LRB): COMPUTER ASSISTED TOTAL KNEE ARTHROPLASTY (Right) Patient reports pain as mild.   Patient seen in rounds with Dr. Ernest PineHooten. Patient is well, and has had no acute complaints or problems Plan is to go Rehab after hospital stay.  [facility] today. Negative for chest pain and shortness of breath Fever: no Gastrointestinal: Negative for nausea and vomiting  Objective: Vital signs in last 24 hours: Temp:  [97.6 F (36.4 C)-97.9 F (36.6 C)] 97.9 F (36.6 C) (06/06 2310) Pulse Rate:  [76-95] 95 (06/06 2310) Resp:  [18-19] 19 (06/06 2310) BP: (143-183)/(78-91) 145/82 (06/06 2310) SpO2:  [96 %-99 %] 96 % (06/06 2310)  Intake/Output from previous day:  Intake/Output Summary (Last 24 hours) at 03/27/2018 0643 Last data filed at 03/27/2018 0553 Gross per 24 hour  Intake 960 ml  Output 450 ml  Net 510 ml    Intake/Output this shift: Total I/O In: -  Out: 50 [Drains:50]  Labs: No results for input(s): HGB in the last 72 hours. No results for input(s): WBC, RBC, HCT, PLT in the last 72 hours. No results for input(s): NA, K, CL, CO2, BUN, CREATININE, GLUCOSE, CALCIUM in the last 72 hours. No results for input(s): LABPT, INR in the last 72 hours.   EXAM General - Patient is Alert and Oriented Extremity - Neurovascular intact Dorsiflexion/Plantar flexion intact Compartment soft Dressing/Incision - clean, dry, no drainage.  The Hemovac was removed and the bandage was removed. Motor Function - intact, moving foot and toes well on exam.  Able to do a straight leg raise.  Past Medical History:  Diagnosis Date  . Anemia    distant past  . Anxiety   . Arthritis    "everywhere" - big toes worst  . Chronic atrial fibrillation (HCC)    a. on eliquis; b. CHADS2VASc at least 2 (age x 1, female)  . Depression   . GERD (gastroesophageal reflux disease)    RARE  . Heart murmur    mild - followed by PCP  . Knee pain   . Motion sickness     back seat of car  . OSA on CPAP    CPAP-4 PSI  . Seasonal allergies    takes allergy weekly    Assessment/Plan: 2 Days Post-Op Procedure(s) (LRB): COMPUTER ASSISTED TOTAL KNEE ARTHROPLASTY (Right) Active Problems:   S/P total knee arthroplasty  Estimated body mass index is 41.07 kg/m as calculated from the following:   Height as of this encounter: 5' 4.5" (1.638 m).   Weight as of this encounter: 110.2 kg (243 lb). Advance diet. Continue physical therapy. [facility]charge [facility] to [facility]  DVT Prophylaxis - Foot Pumps, TED hose and Eliquis Weight-Bearing as tolerated to right leg  Dedra Skeensodd Clotine Heiner, PA-C Orthopaedic Surgery 03/27/2018, 6:43 AM

## 2018-03-27 NOTE — Progress Notes (Signed)
Clinical Social Worker (CSW) received a call from [employer] nurse case manager stating that the medical director Dr. Sharma CovertNorman has denied SNF and offered a peer to peer. Dr. Ernest [facility]Hooten [facility] Per [facility] liaison patient can come today.   Patient is medically stable for D/C to [facility] today. Per [facility] patient can come today to room 806. RN will call report and patient's friend Crystal [facility] will transport. CSW sent D/C orders to [facility] via [facility]. Patient is aware of above. CSW left patient's friend Crystal [facility] a Engineer, technical salesvoicemail. Please reconsult if future social work needs arise. CSW signing off.   Crystal Hughes IncorporatedBailey Angalina Ante, LCSW [phone]

## 2018-03-27 NOTE — Progress Notes (Signed)
Clinical Child psychotherapistocial Worker (CSW) contacted [employer] for a third time to try and get SNF authorization. Per [employer] representative Dava the [employer] nurse has made a determination about SNF and will call today. Dava did not know the time frame in which the call will be made. CSW again emphasized to Dava that a decision has to be made today and she stated that [facility] should get a call today. [facility] liaison is aware of above.   Baker [employer]Bailey Hera Celaya, LCSW [phone]

## 2018-03-27 NOTE — Clinical Social Work Placement (Signed)
   CLINICAL SOCIAL WORK PLACEMENT  NOTE  Date:  03/27/2018  Patient Details  Name: Crystal [facility] MRN: [id number] Date of Birth: 11/29/1945  Clinical Social Work is seeking post-discharge placement for this patient at the Skilled  Nursing Facility level of care (*CSW will initial, date and re-position this form in  chart as items are completed):  Yes   Patient/family provided with [facility] Clinical Social Work Department's list of facilities offering this level of care within the geographic area requested by the patient (or if unable, by the patient's family).  Yes   Patient/family informed of their freedom to choose among providers that offer the needed level of care, that participate in Medicare, Medicaid or managed care program needed by the patient, have an available bed and are willing to accept the patient.  Yes   Patient/family informed of [facility] ownership interest in [facility] and [facility], as well as of the fact that they are under no obligation to receive care at these facilities.  PASRR submitted to EDS on 03/25/18     PASRR number received on 03/25/18     Existing PASRR number confirmed on       FL2 transmitted to all facilities in geographic area requested by pt/family on 03/26/18     FL2 transmitted to all facilities within larger geographic area on       Patient informed that his/her managed care company has contracts with or will negotiate with certain facilities, including the following:        Yes   Patient/family informed of bed offers received.  [facility]     Physician recommends and patient chooses bed at      Patient to be transferred to ([facility] on 03/27/18.  Patient to be transferred to facility by (Patient's friend Corrie DandyMary will transport. )     Patient family notified on 03/27/18 of transfer.  Name of family member notified:  (CSW left patient's friend Corrie DandyMary a Engineer, technical salesvoicemail. )     PHYSICIAN       Additional Comment:     _______________________________________________ Katoya Amato, Darleen CrockerBailey M, LCSW 03/27/2018, 4:27 PM

## 2018-03-27 NOTE — Progress Notes (Signed)
  Progress Note   Date: 03/27/2018  Patient Name: Crystal LollDiane Frechette        MRN#: [id number]   Clarification of the diagnosis of obesity:   morbid obesity    Donato HeinzJames P Bunnie Rehberg, MD

## 2018-03-27 NOTE — Progress Notes (Signed)
Occupational Therapy Treatment Patient Details Name: Crystal [facility] MRN: [id number] DOB: 1946/03/04 Today's Date: 03/27/2018    History of present illness Pt admitted for R TKR.   OT comments  Pt seen for OT tx this date focused on education/training in AE for LB dressing, specifically compression stockings. Pt educated in various sock aids available and with more flexibility to improve use for compression stockings. Educated in juzo pad to assist in sliding stocking into place on foot. Pt verbalized understanding. Pt sat to perform grooming tasks to improve her activity tolerance for ADL.  Pt very fatigued, just completed PT session, stating "I really pushed myself." Pt requesting to rest after session. Will continue to progress. Pt remains appropriate for STR.   Follow Up Recommendations  SNF    Equipment Recommendations  Other (comment)(reacher, flexible sock aid and possibly a juzo pad for compression stockings)    Recommendations for Other Services      Precautions / Restrictions Precautions Precautions: Fall;Knee Precaution Booklet Issued: Yes (comment) Restrictions Weight Bearing Restrictions: Yes RLE Weight Bearing: Weight bearing as tolerated       Mobility Bed Mobility Overal bed mobility: Needs Assistance           General bed mobility comments: deferred, up in recliner  Transfers Overall transfer level: Needs assistance Equipment used: Rolling walker (2 wheeled) Transfers: Sit to/from Stand Sit to Stand: Min assist         General transfer comment: pt requires min assist +1 and cuing for sequencing during transfers    Balance Overall balance assessment: Needs assistance Sitting-balance support: Feet supported Sitting balance-Leahy Scale: Good     Standing balance support: Bilateral upper extremity supported Standing balance-Leahy Scale: Good                             ADL either performed or assessed with clinical judgement   ADL  Overall ADL's : Needs assistance/impaired             Lower Body Bathing: Minimal assistance;Sit to/from stand;With adaptive equipment       Lower Body Dressing: Minimal assistance;With adaptive equipment;Sit to/from stand   Toilet Transfer: RW;Minimal assistance;Ambulation;BSC                   Vision Patient Visual Report: No change from baseline     Perception     Praxis      Cognition Arousal/Alertness: Awake/alert Behavior During Therapy: WFL for tasks assessed/performed Overall Cognitive Status: Within Functional Limits for tasks assessed                                 General Comments: pt reports feeling very fatigued        Exercises Other Exercises Other Exercises: Pt educated in types of sock aids that could be used for donning compression stockings more independently with visual aids to show pros/cons of each. Currently, pt unable to don herself without AE   Shoulder Instructions       General Comments      Pertinent Vitals/ Pain       Pain Assessment: 0-10 Pain Score: 5  Pain Location: R posterior knee Pain Descriptors / Indicators: Aching;Operative site guarding Pain Intervention(s): Limited activity within patient's tolerance;Monitored during session;Ice applied  Home Living  Prior Functioning/Environment              Frequency  Min 1X/week        Progress Toward Goals  OT Goals(current goals can now be found in the care plan section)  Progress towards OT goals: Progressing toward goals  Acute Rehab OT Goals Patient Stated Goal: to get stronger OT Goal Formulation: With patient Time For Goal Achievement: 04/09/18 Potential to Achieve Goals: Good  Plan Discharge plan remains appropriate;Frequency remains appropriate    Co-evaluation                 AM-PAC PT "6 Clicks" Daily Activity     Outcome Measure   Help from another person  eating meals?: None Help from another person taking care of personal grooming?: None Help from another person toileting, which includes using toliet, bedpan, or urinal?: A Little Help from another person bathing (including washing, rinsing, drying)?: A Little Help from another person to put on and taking off regular upper body clothing?: A Little Help from another person to put on and taking off regular lower body clothing?: A Little 6 Click Score: 20    End of Session    OT Visit Diagnosis: Other abnormalities of gait and mobility (R26.89);Muscle weakness (generalized) (M62.81);Pain Pain - part of body: Knee   Activity Tolerance Patient tolerated treatment well;Patient limited by fatigue   Patient Left in chair;with call bell/phone within reach;with chair alarm set;with SCD's reapplied;Other (comment)(polar care in place)   Nurse Communication          Time: [phone] OT Time Calculation (min): 10 min  Charges: OT General Charges $OT Visit: 1 Visit OT Treatments $Self Care/Home Management : 8-22 mins   Richrd PrimeJamie Stiller, MPH, MS, OTR/L ascom [phone] 03/27/18, 11:57 AM

## 2018-03-27 NOTE — Progress Notes (Signed)
Clinical Social Worker (CSW) met with patient and made her aware that [facility] authorization is pending. Per patient she can't pay 5 days up front to [facility]. CSW asked patient to also call [employer] to try to help get authorization. Per patient she will call [employer]. Per [facility] liaison they can't accept a 5 day LOG. Per Claiborne Billings admissions coordinator at [facility] they are not in network with [employer] and won't accept 5 LOGs for [employer].   [employer], LCSW [phone]

## 2018-03-27 NOTE — Discharge Summary (Signed)
Physician Discharge Summary  Subjective: 2 Days Post-Op Procedure(s) (LRB): COMPUTER ASSISTED TOTAL KNEE ARTHROPLASTY (Right) Patient reports pain as moderate.   Patient seen in rounds with Dr. Ernest Pine. Patient is well, and has had no acute complaints or problems Patient is ready to go to rehab for physical therapy  Physician Discharge Summary  Patient ID: Crystal Haas MRN: [id number] DOB/AGE: 71-Feb-1947 71 y.o.  Admit date: 03/25/2018 Discharge date: 03/27/2018  Admission Diagnoses:  Discharge Diagnoses:  Active Problems:   S/P total knee arthroplasty   Discharged Condition: fair  Hospital Course: The patient is postop day 2 from a right total knee replacement.  She ambulated 40 feet with physical therapy.  She has not had a bowel movement.  The patient is doing better with pain control.  She has had stable vitals.  She is ready to go to rehab  Treatments: surgery:  Right total knee arthroplasty using computer-assisted navigation  SURGEON:  Jena Gauss. M.D.  ANESTHESIA: spinal  ESTIMATED BLOOD LOSS: 50 mL  FLUIDS REPLACED: 1150 mL of crystalloid  TOURNIQUET TIME: 102 minutes  DRAINS: 2 medium Hemovac drains  SOFT TISSUE RELEASES: Anterior cruciate ligament, posterior cruciate ligament, deep medial collateral ligament, patellofemoral ligament  IMPLANTS UTILIZED: DePuy Attune size 4N posterior stabilized femoral component (cemented), size 3 rotating platform tibial component (cemented), 35 mm medialized dome patella (cemented), and a 5 mm stabilized rotating platform polyethylene insert.    Discharge Exam: Blood pressure (!) 145/82, pulse 95, temperature 97.9 F (36.6 C), temperature source Oral, resp. rate 19, height 5' 4.5" (1.638 m), weight 110.2 kg (243 lb), SpO2 96 %.   Disposition: Discharge disposition: 03-Skilled Nursing Facility        Allergies as of 03/27/2018      Reactions   Apple Anaphylaxis   Throat swells but subsides with po  benadry   Daucus Carota Anaphylaxis   Peeling of carrot only but subsides with po benadry   Ivp Dye [iodinated Diagnostic Agents] Shortness Of Breath   Also swelling.  Topical betadine is OK.   Other Swelling   Raw fruits cause throat to swell. Topical contact with carrots cause hands to swell, ok to eat. Pt can eat citrus and blueberries with no problems   Strawberry (diagnostic) Anaphylaxis   Throat swells but subsides with benadryl   Strawberry Extract Anaphylaxis   Throat swells but subsides with benadryl   Iodine Swelling   IV    Tape Other (See Comments)   Most tapes case raw skin.  Paper tape is OK.      Medication List    TAKE these medications   acetaminophen 500 MG tablet Commonly known as:  TYLENOL Take 1,000 mg by mouth every 6 (six) hours as needed (for pain.).   apixaban 5 MG Tabs tablet Commonly known as:  ELIQUIS Take 1 tablet (5 mg total) by mouth 2 (two) times daily.   atorvastatin 20 MG tablet Commonly known as:  LIPITOR TAKE 1 TABLET(20 MG) BY MOUTH DAILY What changed:    how much to take  how to take this  when to take this  additional instructions   B-complex with vitamin C tablet Take 1 tablet by mouth daily.   celecoxib 200 MG capsule Commonly known as:  CELEBREX Take 200 mg by mouth 2 (two) times daily as needed for pain.   diphenhydrAMINE 25 MG tablet Commonly known as:  BENADRYL Take 25 mg by mouth every morning.   EPIPEN 2-PAK 0.3 mg/0.3 mL Soaj  injection Generic drug:  EPINEPHrine Inject 0.3 mg as directed as directed. AS NEEDED FOR ANAPHYLAXIS   fluticasone 50 MCG/ACT nasal spray Commonly known as:  FLONASE Place 2 sprays into both nostrils daily.   Melatonin 10 MG Tabs Take 30 mg by mouth at bedtime.   montelukast 10 MG tablet Commonly known as:  SINGULAIR Take 10 mg by mouth at bedtime.   multivitamin with minerals Tabs tablet Take 1 tablet by mouth daily. One-A-Day Active 65+   NON FORMULARY 10 each by Other route  daily. GIN SOAKED RAISINS FOR PAIN RELIEF   oxyCODONE 5 MG immediate release tablet Commonly known as:  Oxy IR/ROXICODONE Take 1 tablet (5 mg total) by mouth every 4 (four) hours as needed for moderate pain (pain score 4-6).   Papaya Chew Chew 3-4 each by mouth as needed.   PARoxetine 20 MG tablet Commonly known as:  PAXIL Take 1 tablet (20 mg total) by mouth daily. What changed:  when to take this   PATADAY 0.2 % Soln Generic drug:  Olopatadine HCl Place 1 drop into both eyes daily.   traMADol 50 MG tablet Commonly known as:  ULTRAM Take 1-2 tablets (50-100 mg total) by mouth every 4 (four) hours as needed for moderate pain.   Vitamin D3 2000 units Tabs Take 2,000 Units by mouth daily.   zolpidem 5 MG tablet Commonly known as:  AMBIEN TAKE 1 TABLET BY MOUTH AT BEDTIME AS NEEDED FOR SLEEP What changed:    how much to take  how to take this  when to take this  additional instructions            Durable Medical Equipment  (From admission, onward)        Start     Ordered   03/25/18 1310  DME Walker rolling  Once    Question:  Patient needs a walker to treat with the following condition  Answer:  Total knee replacement status   03/25/18 1309   03/25/18 1310  DME Bedside commode  Once    Question:  Patient needs a bedside commode to treat with the following condition  Answer:  Total knee replacement status   03/25/18 1309      Contact information for follow-up providers    Evon Slack, PA-C On 04/10/2018.   Specialties:  Orthopedic Surgery, Emergency Medicine Why:  at 2:15p, Contact information: [address] [place] [address] [phone]        Donato Heinz, MD On 05/12/2018.   Specialty:  Orthopedic Surgery Why:  at 11:00am Contact information: [address] [facility] [place] [address] [phone]            Contact information for after-discharge care    Destination    [facility] .   Service:  Skilled Nursing Contact information: [address] [place] [address] [phone]                  Signed: Lenard Forth, Raymie Trani 03/27/2018, 6:47 AM   Objective: Vital signs in last 24 hours: Temp:  [97.6 F (36.4 C)-97.9 F (36.6 C)] 97.9 F (36.6 C) (06/06 2310) Pulse Rate:  [76-95] 95 (06/06 2310) Resp:  [18-19] 19 (06/06 2310) BP: (143-183)/(78-91) 145/82 (06/06 2310) SpO2:  [96 %-99 %] 96 % (06/06 2310)  Intake/Output from previous day:  Intake/Output Summary (Last 24 hours) at 03/27/2018 0647 Last data filed at 03/27/2018 0553 Gross per 24 hour  Intake  960 ml  Output 450 ml  Net 510 ml    Intake/Output this shift: Total I/O In: -  Out: 50 [Drains:50]  Labs: No results for input(s): HGB in the last 72 hours. No results for input(s): WBC, RBC, HCT, PLT in the last 72 hours. No results for input(s): NA, K, CL, CO2, BUN, CREATININE, GLUCOSE, CALCIUM in the last 72 hours. No results for input(s): LABPT, INR in the last 72 hours.  EXAM: General - Patient is Alert and Oriented Extremity - Neurovascular intact Dorsiflexion/Plantar flexion intact No cellulitis present Compartment soft Incision - clean, dry, no drainage, with a Hemovac removed. Motor Function -plantar flexion and dorsiflexion intact.  Able to do a straight leg raise.  Assessment/Plan: 2 Days Post-Op Procedure(s) (LRB): COMPUTER ASSISTED TOTAL KNEE ARTHROPLASTY (Right) Procedure(s) (LRB): COMPUTER ASSISTED TOTAL KNEE ARTHROPLASTY (Right) Past Medical History:  Diagnosis Date  . Anemia    distant past  . Anxiety   . Arthritis    "everywhere" - big toes worst  . Chronic atrial fibrillation (HCC)    a. on eliquis; b. CHADS2VASc at least 2 (age x 1, female)  . Depression   . GERD (gastroesophageal reflux disease)    RARE  . Heart murmur    mild - followed by PCP  . Knee pain   . Motion sickness    back seat of car  . OSA on CPAP    CPAP-4 PSI  . Seasonal  allergies    takes allergy weekly   Active Problems:   S/P total knee arthroplasty  Estimated body mass index is 41.07 kg/m as calculated from the following:   Height as of this encounter: 5' 4.5" (1.638 m).   Weight as of this encounter: 110.2 kg (243 lb). Advance diet Up with therapy Discharge to SNF Diet - Regular diet Follow up - in 2 weeks Activity - WBAT Disposition - Rehab Condition Upon Discharge - Stable DVT Prophylaxis - TED hose and Eliquis  Dedra Skeensodd Robinson Brinkley, PA-C Orthopaedic Surgery 03/27/2018, 6:47 AM

## 2018-03-27 NOTE — Care Management Important Message (Signed)
Important Message  Patient Details  Name: Crystal LollDiane Smoker MRN: [id number] Date of Birth: 05/06/1946   Medicare Important Message Given:  Yes    Olegario MessierKathy A Riordan Walle 03/27/2018, 9:58 AM

## 2018-03-27 NOTE — Progress Notes (Signed)
Physical Therapy Treatment Patient Details Name: Crystal Haas MRN: [id number] DOB: 1946-03-24 Today's Date: 03/27/2018    History of Present Illness Pt admitted for R TKR.    PT Comments    Pt continues to progress towards her goals. Pt able to perform ambulation and transfers with min assist and continued verbal cuing for safety. Pt still becomes light headed and dizzy after transfers possibly due to holding her breath during. Pt demonstrating improvements with HEP however could benefit from continued skilled therapy due to the continued need for guarding and cuing for safety. At this time patient appears very fatigued and states that it could be because she did not sleep well last night. Pt needs continued assistance with transfers and mobility for safety. Pt becoming less fearful to accept weight on R LE during amb and could benefit from more extensive gait training. PT will continue to see pt BID until discharge. SNF continues to be PTs recommendation for discharge at this time.    Follow Up Recommendations  SNF     Equipment Recommendations  None recommended by PT    Recommendations for Other Services       Precautions / Restrictions Precautions Precautions: Fall;Knee Precaution Booklet Issued: Yes (comment) Restrictions Weight Bearing Restrictions: Yes RLE Weight Bearing: Weight bearing as tolerated    Mobility  Bed Mobility Overal bed mobility: Needs Assistance Bed Mobility: Supine to Sit     Supine to sit: Min assist Sit to supine: Min assist      Transfers Overall transfer level: Needs assistance Equipment used: Rolling walker (2 wheeled) Transfers: Sit to/from Stand Sit to Stand: Min assist         General transfer comment: pt requires min assist +1 and cuing for sequencing during transfers  Ambulation/Gait Ambulation/Gait assistance: Min assist Ambulation Distance (Feet): 30 Feet Assistive device: Rolling walker (2 wheeled) Gait Pattern/deviations:  Decreased step length - right;Decreased step length - left;Shuffle;Step-to pattern     General Gait Details: pt demonstrates step to shuffling gait pattern with min assist and gait pattern. pt states that she is becoming less fearful to put weight on R leg. pt requires assistance with safe DME use and cuing for upright posture.   Stairs             Wheelchair Mobility    Modified Rankin (Stroke Patients Only)       Balance                                            Cognition Arousal/Alertness: Awake/alert Behavior During Therapy: WFL for tasks assessed/performed Overall Cognitive Status: Within Functional Limits for tasks assessed                                 General Comments: pt reports feeling very fatigued      Exercises Other Exercises Other Exercises: Pt instructed in HEP requiring min assist for safety and cuing for technique x 15 reps each: ankle pumps, quad sets, LAQ, glut sets, SLR, and abd. Pt min assist on and off toilet.    General Comments        Pertinent Vitals/Pain Pain Assessment: 0-10 Pain Score: 7  Pain Location: R posterior knee Pain Descriptors / Indicators: Aching;Operative site guarding Pain Intervention(s): Limited activity within patient's tolerance;Monitored during session;Repositioned;Ice applied  Home Living                      Prior Function            PT Goals (current goals can now be found in the care plan section) Acute Rehab PT Goals Patient Stated Goal: to get stronger PT Goal Formulation: With patient Time For Goal Achievement: 04/08/18 Potential to Achieve Goals: Good Progress towards PT goals: Progressing toward goals    Frequency    BID      PT Plan Current plan remains appropriate    Co-evaluation              AM-PAC PT "6 Clicks" Daily Activity  Outcome Measure  Difficulty turning over in bed (including adjusting bedclothes, sheets and blankets)?:  A Little Difficulty moving from lying on back to sitting on the side of the bed? : Unable Difficulty sitting down on and standing up from a chair with arms (e.g., wheelchair, bedside commode, etc,.)?: Unable Help needed moving to and from a bed to chair (including a wheelchair)?: A Little Help needed walking in hospital room?: A Lot Help needed climbing 3-5 steps with a railing? : Total 6 Click Score: 11    End of Session Equipment Utilized During Treatment: Gait belt Activity Tolerance: Patient tolerated treatment well Patient left: in chair;with call bell/phone within reach;with chair alarm set;with SCD's reapplied Nurse Communication: Other (comment) PT Visit Diagnosis: Muscle weakness (generalized) (M62.81);Difficulty in walking, not elsewhere classified (R26.2);Pain Pain - Right/Left: Right Pain - part of body: Knee     Time: [id number] PT Time Calculation (min) (ACUTE ONLY): 33 min  Charges:                       G Codes:       Zamyia Gowell Elnora Morrisonhaffin, SPT    Shineka Auble 03/27/2018, 4:45 PM

## 2018-03-27 NOTE — Progress Notes (Signed)
Clinical Child psychotherapistocial Worker (CSW) contacted [employer] to check on SNF authorization status. CSW spoke to [place] who reported that a nurse is reviewing the case and it is pending. Per Maxine GlennMonica the nurse requested PT and OT notes 48 hours after surgery. CS[facility] and [facility] to [facility] that patient is medically stable for D/C today and we need a decision from [place]. Maxine GlennMonica reported that she would put a note in the chart stating that.    Baker Hughes IncorporatedBailey Lugene Beougher, LCSW [phone]

## 2018-03-27 NOTE — Progress Notes (Signed)
Patient is being discharged to [facility] room 807. Report called to Yahoo! IncKim RN. Patient's friend, Corrie DandyMary, will transport to rehab. IV removed, belongings packed. DC packet printed.

## 2018-03-27 NOTE — Progress Notes (Signed)
Clinical Child psychotherapistocial Worker (CSW) contacted [employer] for a second time and spoke to [place]. Per Conard NovakEugena the additional clinicals have been received and the nurse has to review it. Per Conard NovakEugena the nurse has 24 hours to review the clinicals. CSW emphasized to [place] that patient is stable for D/C today and [employer] needs to make a determination about SNF today. Per Conard NovakEugena she will send a message to the nurse making her aware of above.   B[employer] IncorporatedBailey Alfard Cochrane, LCSW [phone]) [phone]

## 2018-03-27 NOTE — Progress Notes (Signed)
Friend p/u patient and is transferring to [facility]

## 2018-03-31 DIAGNOSIS — M1990 Unspecified osteoarthritis, unspecified site: Secondary | ICD-10-CM | POA: Diagnosis not present

## 2018-03-31 DIAGNOSIS — R69 Illness, unspecified: Secondary | ICD-10-CM | POA: Diagnosis not present

## 2018-03-31 DIAGNOSIS — J302 Other seasonal allergic rhinitis: Secondary | ICD-10-CM | POA: Diagnosis not present

## 2018-03-31 DIAGNOSIS — Z96651 Presence of right artificial knee joint: Secondary | ICD-10-CM | POA: Diagnosis not present

## 2018-03-31 DIAGNOSIS — E785 Hyperlipidemia, unspecified: Secondary | ICD-10-CM | POA: Diagnosis not present

## 2018-04-03 ENCOUNTER — Ambulatory Visit: Payer: Medicare HMO | Admitting: Cardiovascular Disease

## 2018-04-05 DIAGNOSIS — I482 Chronic atrial fibrillation: Secondary | ICD-10-CM | POA: Diagnosis not present

## 2018-04-05 DIAGNOSIS — Z471 Aftercare following joint replacement surgery: Secondary | ICD-10-CM | POA: Diagnosis not present

## 2018-04-05 DIAGNOSIS — H04123 Dry eye syndrome of bilateral lacrimal glands: Secondary | ICD-10-CM | POA: Diagnosis not present

## 2018-04-05 DIAGNOSIS — Z96651 Presence of right artificial knee joint: Secondary | ICD-10-CM | POA: Diagnosis not present

## 2018-04-05 DIAGNOSIS — Z9181 History of falling: Secondary | ICD-10-CM | POA: Diagnosis not present

## 2018-04-05 DIAGNOSIS — E785 Hyperlipidemia, unspecified: Secondary | ICD-10-CM | POA: Diagnosis not present

## 2018-04-05 DIAGNOSIS — R69 Illness, unspecified: Secondary | ICD-10-CM | POA: Diagnosis not present

## 2018-04-05 DIAGNOSIS — G4733 Obstructive sleep apnea (adult) (pediatric): Secondary | ICD-10-CM | POA: Diagnosis not present

## 2018-04-07 DIAGNOSIS — G4733 Obstructive sleep apnea (adult) (pediatric): Secondary | ICD-10-CM | POA: Diagnosis not present

## 2018-04-07 DIAGNOSIS — H04123 Dry eye syndrome of bilateral lacrimal glands: Secondary | ICD-10-CM | POA: Diagnosis not present

## 2018-04-07 DIAGNOSIS — Z9181 History of falling: Secondary | ICD-10-CM | POA: Diagnosis not present

## 2018-04-07 DIAGNOSIS — Z471 Aftercare following joint replacement surgery: Secondary | ICD-10-CM | POA: Diagnosis not present

## 2018-04-07 DIAGNOSIS — I482 Chronic atrial fibrillation: Secondary | ICD-10-CM | POA: Diagnosis not present

## 2018-04-07 DIAGNOSIS — E785 Hyperlipidemia, unspecified: Secondary | ICD-10-CM | POA: Diagnosis not present

## 2018-04-07 DIAGNOSIS — R69 Illness, unspecified: Secondary | ICD-10-CM | POA: Diagnosis not present

## 2018-04-07 DIAGNOSIS — Z96651 Presence of right artificial knee joint: Secondary | ICD-10-CM | POA: Diagnosis not present

## 2018-04-08 DIAGNOSIS — R69 Illness, unspecified: Secondary | ICD-10-CM | POA: Diagnosis not present

## 2018-04-08 DIAGNOSIS — Z96651 Presence of right artificial knee joint: Secondary | ICD-10-CM | POA: Diagnosis not present

## 2018-04-08 DIAGNOSIS — Z471 Aftercare following joint replacement surgery: Secondary | ICD-10-CM | POA: Diagnosis not present

## 2018-04-08 DIAGNOSIS — E785 Hyperlipidemia, unspecified: Secondary | ICD-10-CM | POA: Diagnosis not present

## 2018-04-08 DIAGNOSIS — I482 Chronic atrial fibrillation: Secondary | ICD-10-CM | POA: Diagnosis not present

## 2018-04-08 DIAGNOSIS — G4733 Obstructive sleep apnea (adult) (pediatric): Secondary | ICD-10-CM | POA: Diagnosis not present

## 2018-04-08 DIAGNOSIS — Z9181 History of falling: Secondary | ICD-10-CM | POA: Diagnosis not present

## 2018-04-08 DIAGNOSIS — H04123 Dry eye syndrome of bilateral lacrimal glands: Secondary | ICD-10-CM | POA: Diagnosis not present

## 2018-04-09 DIAGNOSIS — E785 Hyperlipidemia, unspecified: Secondary | ICD-10-CM | POA: Diagnosis not present

## 2018-04-09 DIAGNOSIS — G4733 Obstructive sleep apnea (adult) (pediatric): Secondary | ICD-10-CM | POA: Diagnosis not present

## 2018-04-09 DIAGNOSIS — H04123 Dry eye syndrome of bilateral lacrimal glands: Secondary | ICD-10-CM | POA: Diagnosis not present

## 2018-04-09 DIAGNOSIS — Z9181 History of falling: Secondary | ICD-10-CM | POA: Diagnosis not present

## 2018-04-09 DIAGNOSIS — Z96651 Presence of right artificial knee joint: Secondary | ICD-10-CM | POA: Diagnosis not present

## 2018-04-09 DIAGNOSIS — R69 Illness, unspecified: Secondary | ICD-10-CM | POA: Diagnosis not present

## 2018-04-09 DIAGNOSIS — Z471 Aftercare following joint replacement surgery: Secondary | ICD-10-CM | POA: Diagnosis not present

## 2018-04-09 DIAGNOSIS — I482 Chronic atrial fibrillation: Secondary | ICD-10-CM | POA: Diagnosis not present

## 2018-04-10 ENCOUNTER — Ambulatory Visit: Payer: Medicare HMO | Admitting: Family Medicine

## 2018-04-10 DIAGNOSIS — Z9181 History of falling: Secondary | ICD-10-CM | POA: Diagnosis not present

## 2018-04-10 DIAGNOSIS — H04123 Dry eye syndrome of bilateral lacrimal glands: Secondary | ICD-10-CM | POA: Diagnosis not present

## 2018-04-10 DIAGNOSIS — R69 Illness, unspecified: Secondary | ICD-10-CM | POA: Diagnosis not present

## 2018-04-10 DIAGNOSIS — E785 Hyperlipidemia, unspecified: Secondary | ICD-10-CM | POA: Diagnosis not present

## 2018-04-10 DIAGNOSIS — Z96651 Presence of right artificial knee joint: Secondary | ICD-10-CM | POA: Diagnosis not present

## 2018-04-10 DIAGNOSIS — I482 Chronic atrial fibrillation: Secondary | ICD-10-CM | POA: Diagnosis not present

## 2018-04-10 DIAGNOSIS — Z471 Aftercare following joint replacement surgery: Secondary | ICD-10-CM | POA: Diagnosis not present

## 2018-04-10 DIAGNOSIS — G4733 Obstructive sleep apnea (adult) (pediatric): Secondary | ICD-10-CM | POA: Diagnosis not present

## 2018-04-13 DIAGNOSIS — H04123 Dry eye syndrome of bilateral lacrimal glands: Secondary | ICD-10-CM | POA: Diagnosis not present

## 2018-04-13 DIAGNOSIS — G4733 Obstructive sleep apnea (adult) (pediatric): Secondary | ICD-10-CM | POA: Diagnosis not present

## 2018-04-13 DIAGNOSIS — I482 Chronic atrial fibrillation: Secondary | ICD-10-CM | POA: Diagnosis not present

## 2018-04-13 DIAGNOSIS — Z9181 History of falling: Secondary | ICD-10-CM | POA: Diagnosis not present

## 2018-04-13 DIAGNOSIS — Z471 Aftercare following joint replacement surgery: Secondary | ICD-10-CM | POA: Diagnosis not present

## 2018-04-13 DIAGNOSIS — J301 Allergic rhinitis due to pollen: Secondary | ICD-10-CM | POA: Diagnosis not present

## 2018-04-13 DIAGNOSIS — E785 Hyperlipidemia, unspecified: Secondary | ICD-10-CM | POA: Diagnosis not present

## 2018-04-13 DIAGNOSIS — Z96651 Presence of right artificial knee joint: Secondary | ICD-10-CM | POA: Diagnosis not present

## 2018-04-13 DIAGNOSIS — R69 Illness, unspecified: Secondary | ICD-10-CM | POA: Diagnosis not present

## 2018-04-14 ENCOUNTER — Telehealth: Payer: Self-pay | Admitting: Cardiovascular Disease

## 2018-04-14 DIAGNOSIS — G4733 Obstructive sleep apnea (adult) (pediatric): Secondary | ICD-10-CM | POA: Diagnosis not present

## 2018-04-14 DIAGNOSIS — Z471 Aftercare following joint replacement surgery: Secondary | ICD-10-CM | POA: Diagnosis not present

## 2018-04-14 DIAGNOSIS — H04123 Dry eye syndrome of bilateral lacrimal glands: Secondary | ICD-10-CM | POA: Diagnosis not present

## 2018-04-14 DIAGNOSIS — Z9181 History of falling: Secondary | ICD-10-CM | POA: Diagnosis not present

## 2018-04-14 DIAGNOSIS — Z96651 Presence of right artificial knee joint: Secondary | ICD-10-CM | POA: Diagnosis not present

## 2018-04-14 DIAGNOSIS — R69 Illness, unspecified: Secondary | ICD-10-CM | POA: Diagnosis not present

## 2018-04-14 DIAGNOSIS — I482 Chronic atrial fibrillation: Secondary | ICD-10-CM | POA: Diagnosis not present

## 2018-04-14 DIAGNOSIS — E785 Hyperlipidemia, unspecified: Secondary | ICD-10-CM | POA: Diagnosis not present

## 2018-04-14 NOTE — Telephone Encounter (Signed)
A fax was pulled from Dr. Jari SportsmanArida's out box in his office. He had reviewed a note from Dr. Gaynelle AduEric Wilson stating the patient would need to have a stress test piror to weight loss sx/ sleeve. Per Dr. Kirke CorinArida, can schedule a lexiscan myoview for pre-op clearance. Reviewed Eula Listenyan Dunn, PA's office note from 03/09/18 stating that the patient was at the time cleared for knee surgery, but would call back to schedule a stress test prior to weight loss surgery.  I called her today to confirm that the plan is still for her to call us when she is ready to schedule a stress test.  Per the patient, she is still recovering from knee surgery and will call back when she is ready to pursue a stress test for pre-op clearance for bariatric surgery.    Surgeon: Dr. Gaynelle AduEric Wilson [facility] [address] [place] [phone]- phone/ [phone]- fax

## 2018-04-16 DIAGNOSIS — Z9181 History of falling: Secondary | ICD-10-CM | POA: Diagnosis not present

## 2018-04-16 DIAGNOSIS — R69 Illness, unspecified: Secondary | ICD-10-CM | POA: Diagnosis not present

## 2018-04-16 DIAGNOSIS — Z471 Aftercare following joint replacement surgery: Secondary | ICD-10-CM | POA: Diagnosis not present

## 2018-04-16 DIAGNOSIS — Z96651 Presence of right artificial knee joint: Secondary | ICD-10-CM | POA: Diagnosis not present

## 2018-04-16 DIAGNOSIS — E785 Hyperlipidemia, unspecified: Secondary | ICD-10-CM | POA: Diagnosis not present

## 2018-04-16 DIAGNOSIS — G4733 Obstructive sleep apnea (adult) (pediatric): Secondary | ICD-10-CM | POA: Diagnosis not present

## 2018-04-16 DIAGNOSIS — H04123 Dry eye syndrome of bilateral lacrimal glands: Secondary | ICD-10-CM | POA: Diagnosis not present

## 2018-04-16 DIAGNOSIS — I482 Chronic atrial fibrillation: Secondary | ICD-10-CM | POA: Diagnosis not present

## 2018-04-17 DIAGNOSIS — G4733 Obstructive sleep apnea (adult) (pediatric): Secondary | ICD-10-CM | POA: Diagnosis not present

## 2018-04-17 DIAGNOSIS — Z9181 History of falling: Secondary | ICD-10-CM | POA: Diagnosis not present

## 2018-04-17 DIAGNOSIS — E785 Hyperlipidemia, unspecified: Secondary | ICD-10-CM | POA: Diagnosis not present

## 2018-04-17 DIAGNOSIS — Z471 Aftercare following joint replacement surgery: Secondary | ICD-10-CM | POA: Diagnosis not present

## 2018-04-17 DIAGNOSIS — H04123 Dry eye syndrome of bilateral lacrimal glands: Secondary | ICD-10-CM | POA: Diagnosis not present

## 2018-04-17 DIAGNOSIS — Z96651 Presence of right artificial knee joint: Secondary | ICD-10-CM | POA: Diagnosis not present

## 2018-04-17 DIAGNOSIS — I482 Chronic atrial fibrillation: Secondary | ICD-10-CM | POA: Diagnosis not present

## 2018-04-17 DIAGNOSIS — R69 Illness, unspecified: Secondary | ICD-10-CM | POA: Diagnosis not present

## 2018-04-20 ENCOUNTER — Encounter: Payer: Self-pay | Admitting: Family Medicine

## 2018-04-20 ENCOUNTER — Ambulatory Visit (INDEPENDENT_AMBULATORY_CARE_PROVIDER_SITE_OTHER): Payer: Medicare HMO | Admitting: Family Medicine

## 2018-04-20 VITALS — BP 154/84 | HR 59 | Temp 97.6°F | Ht 65.0 in | Wt 237.6 lb

## 2018-04-20 DIAGNOSIS — J301 Allergic rhinitis due to pollen: Secondary | ICD-10-CM | POA: Diagnosis not present

## 2018-04-20 DIAGNOSIS — I482 Chronic atrial fibrillation, unspecified: Secondary | ICD-10-CM

## 2018-04-20 DIAGNOSIS — I10 Essential (primary) hypertension: Secondary | ICD-10-CM

## 2018-04-20 DIAGNOSIS — Z96651 Presence of right artificial knee joint: Secondary | ICD-10-CM | POA: Diagnosis not present

## 2018-04-20 DIAGNOSIS — R61 Generalized hyperhidrosis: Secondary | ICD-10-CM | POA: Diagnosis not present

## 2018-04-20 LAB — CBC WITH DIFFERENTIAL/PLATELET
Basophils Absolute: 0.1 10*3/uL (ref 0.0–0.1)
Basophils Relative: 1.1 % (ref 0.0–3.0)
EOS PCT: 6.1 % — AB (ref 0.0–5.0)
Eosinophils Absolute: 0.5 10*3/uL (ref 0.0–0.7)
HCT: 38.4 % (ref 36.0–46.0)
HEMOGLOBIN: 12.5 g/dL (ref 12.0–15.0)
Lymphocytes Relative: 23.2 % (ref 12.0–46.0)
Lymphs Abs: 1.8 10*3/uL (ref 0.7–4.0)
MCHC: 32.5 g/dL (ref 30.0–36.0)
MCV: 91.4 fl (ref 78.0–100.0)
MONO ABS: 1 10*3/uL (ref 0.1–1.0)
Monocytes Relative: 12.3 % — ABNORMAL HIGH (ref 3.0–12.0)
Neutro Abs: 4.5 10*3/uL (ref 1.4–7.7)
Neutrophils Relative %: 57.3 % (ref 43.0–77.0)
Platelets: 286 10*3/uL (ref 150.0–400.0)
RBC: 4.2 Mil/uL (ref 3.87–5.11)
RDW: 13.2 % (ref 11.5–15.5)
WBC: 7.8 10*3/uL (ref 4.0–10.5)

## 2018-04-20 LAB — COMPREHENSIVE METABOLIC PANEL
ALBUMIN: 4.2 g/dL (ref 3.5–5.2)
ALK PHOS: 62 U/L (ref 39–117)
ALT: 11 U/L (ref 0–35)
AST: 13 U/L (ref 0–37)
BILIRUBIN TOTAL: 0.4 mg/dL (ref 0.2–1.2)
BUN: 19 mg/dL (ref 6–23)
CO2: 29 mEq/L (ref 19–32)
Calcium: 9.5 mg/dL (ref 8.4–10.5)
Chloride: 106 mEq/L (ref 96–112)
Creatinine, Ser: 0.81 mg/dL (ref 0.40–1.20)
GFR: 74.14 mL/min (ref >60.00)
Glucose, Bld: 105 mg/dL — ABNORMAL HIGH (ref 70–99)
POTASSIUM: 4.8 meq/L (ref 3.5–5.1)
SODIUM: 142 meq/L (ref 135–145)
TOTAL PROTEIN: 7 g/dL (ref 6.0–8.3)

## 2018-04-20 LAB — POCT URINALYSIS DIPSTICK
Bilirubin, UA: NEGATIVE
GLUCOSE UA: NEGATIVE
Ketones, UA: NEGATIVE
LEUKOCYTES UA: NEGATIVE
Nitrite, UA: NEGATIVE
Protein, UA: NEGATIVE
RBC UA: NEGATIVE
Spec Grav, UA: <=1.005 — AB (ref 1.010–1.025)
Urobilinogen, UA: 0.2 E.U./dL
pH, UA: 5 (ref 5.0–8.0)

## 2018-04-20 LAB — TSH: TSH: 2.63 u[IU]/mL (ref 0.35–4.50)

## 2018-04-20 LAB — SEDIMENTATION RATE: SED RATE: 12 mm/h (ref 0–30)

## 2018-04-20 NOTE — Assessment & Plan Note (Addendum)
Patient with onset of night sweats after getting home from rehab.  No other significant symptoms noted.  No lymphadenopathy noted.  No signs of infection.  Does not appear to be medication related.  We will check lab work to evaluate for potential causes.  She will monitor for now and if it continues would consider further evaluation.

## 2018-04-20 NOTE — Patient Instructions (Addendum)
Nice to see you. We will check lab work today and contact you with the results. We will get you to see your cardiologist given the difference in blood pressures between your arms.

## 2018-04-20 NOTE — Assessment & Plan Note (Signed)
Stable.  She will continue to see cardiology.

## 2018-04-20 NOTE — Progress Notes (Signed)
Tommi Rumps, MD Phone: [phone]  Crystal Haas is a 72 y.o. female who presents today for f.u.  CC: right knee replacement, elevated BP, night sweats  Patient status post total right knee replacement.  She went to [facility] and did not have a great experience there.  Physical therapy did not do much with her there.  She has been doing well with home physical therapy.  She starts outpatient physical therapy tomorrow.  She is no longer on pain medication.  She is icing it.  She notes a little swelling.  A little achiness though not much pain.  She continues to see orthopedics in follow-up.  She reports her blood pressure was up-and-down at rehab.  They did not start her on medication.  She reports the home health nurse checked her blood pressure at one point and noted a difference between her right and left arm.  She has had no chest pain or shortness of breath.  No fatigue in either arm.  The patient reports after she got home from rehab she started to have night sweats.  Notes they are drenching at times.  No sweats during the day.  Last night was the first night they did not occur.  She has had no fevers.  No weight loss.  No itching.  No cough or congestion.  No nausea, vomiting, diarrhea, or abdominal pain.  No blood in her stool.  She is up-to-date on mammography.  Colonoscopy in 2014.  A. fib: She continues on Eliquis.  She notes no bleeding issues.  No palpitations.  Social History   Tobacco Use  Smoking Status Former Smoker  . Packs/day: 0.25  . Years: 2.00  . Pack years: 0.50  . Types: Cigarettes  . Last attempt to quit: 10/21/1969  . Years since quitting: 48.5  Smokeless Tobacco Never Used     ROS see history of present illness  Objective  Physical Exam Vitals:   04/20/18 1116  BP: (!) 154/84  Pulse: (!) 59  Temp: 97.6 F (36.4 C)  SpO2: 97%    BP Readings from Last 3 Encounters:  04/20/18 (!) 154/84  03/27/18 (!) 150/108  03/09/18 140/80   Wt  Readings from Last 3 Encounters:  04/20/18 237 lb 9.6 oz (107.8 kg)  03/25/18 243 lb (110.2 kg)  03/09/18 241 lb 8 oz (109.5 kg)    Physical Exam  Constitutional: No distress.  Cardiovascular: Normal rate and normal heart sounds. An irregularly irregular rhythm present.  2+ radial pulses, bilateral hands are warm and well-perfused  Pulmonary/Chest: Effort normal and breath sounds normal.  Musculoskeletal: She exhibits no edema.  Right knee is nontender with no significant swelling, no warmth or erythema, well-healing surgical incision  Lymphadenopathy:    She has no cervical adenopathy.    She has no axillary adenopathy.       Right: No inguinal and no supraclavicular adenopathy present.       Left: No inguinal and no supraclavicular adenopathy present.  Neurological: She is alert.  Skin: Skin is warm and dry. She is not diaphoretic.     Assessment/Plan: Please see individual problem list.  S/P total knee arthroplasty Patient seems to be doing fairly well following knee replacement surgery.  She will continue with physical therapy.  She will continue to see orthopedics.  Hypertension Blood pressures have been up and down to some degree.  There is a difference between the 2 arms with her left measuring 170/90 and her right measuring 140/90.  She  has good pulses in her wrists and her hands feel warm and well-perfused.  She notes no claudication symptoms.  We will have her follow-up with her cardiologist for the blood pressure differences and will help arrange this.  Night sweats Patient with onset of night sweats after getting home from rehab.  No other significant symptoms noted.  No lymphadenopathy noted.  No signs of infection.  Does not appear to be medication related.  We will check lab work to evaluate for potential causes.  She will monitor for now and if it continues would consider further evaluation.  Chronic atrial fibrillation (HCC) Stable.  She will continue to see  cardiology.   Orders Placed This Encounter  Procedures  . TSH  . CBC w/Diff  . Comp Met (CMET)  . Sedimentation rate  . POCT Urinalysis Dipstick    No orders of the defined types were placed in this encounter.    Tommi Rumps, MD [place]

## 2018-04-20 NOTE — Assessment & Plan Note (Signed)
Blood pressures have been up and down to some degree.  There is a difference between the 2 arms with her left measuring 170/90 and her right measuring 140/90.  She has good pulses in her wrists and her hands feel warm and well-perfused.  She notes no claudication symptoms.  We will have her follow-up with her cardiologist for the blood pressure differences and will help arrange this.

## 2018-04-20 NOTE — Assessment & Plan Note (Signed)
Patient seems to be doing fairly well following knee replacement surgery.  She will continue with physical therapy.  She will continue to see orthopedics.

## 2018-04-21 ENCOUNTER — Telehealth: Payer: Self-pay | Admitting: Family Medicine

## 2018-04-21 DIAGNOSIS — Z96651 Presence of right artificial knee joint: Secondary | ICD-10-CM | POA: Diagnosis not present

## 2018-04-21 DIAGNOSIS — M25561 Pain in right knee: Secondary | ICD-10-CM | POA: Diagnosis not present

## 2018-04-21 DIAGNOSIS — R29898 Other symptoms and signs involving the musculoskeletal system: Secondary | ICD-10-CM | POA: Diagnosis not present

## 2018-04-21 DIAGNOSIS — M25661 Stiffness of right knee, not elsewhere classified: Secondary | ICD-10-CM | POA: Diagnosis not present

## 2018-04-21 NOTE — Telephone Encounter (Signed)
Pt given results and documented in result note 

## 2018-04-21 NOTE — Telephone Encounter (Unsigned)
Copied from CRM [phone]. Topic: Quick Communication - Lab Results >> Apr 21, 2018  4:11 PM Inetta FermoHendricks, Jessica S, CMA wrote: Left message to return call, ok for [facility] to give results and speak to patient and schedule lab appointment

## 2018-04-27 DIAGNOSIS — M25561 Pain in right knee: Secondary | ICD-10-CM | POA: Diagnosis not present

## 2018-04-27 DIAGNOSIS — J301 Allergic rhinitis due to pollen: Secondary | ICD-10-CM | POA: Diagnosis not present

## 2018-04-27 DIAGNOSIS — Z96651 Presence of right artificial knee joint: Secondary | ICD-10-CM | POA: Diagnosis not present

## 2018-04-29 ENCOUNTER — Telehealth: Payer: Self-pay | Admitting: Cardiovascular Disease

## 2018-04-29 NOTE — Telephone Encounter (Signed)
Called patient to schedule appt per dr. Birdie SonsSonnenberg htn afib   Patient concerned about wait for 8/21 next available that she may die before the appt .    Added to waitlst

## 2018-04-29 NOTE — Telephone Encounter (Signed)
Call returned to the patient. She stated that at her last appointment with Dr. Birdie SonsSonnenberg that her blood pressures were reading differently in her left and right arm. Per Dr. Purvis SheffieldSonnenberg's notes, right arm was 140/90 and her left arm was 170/90. He would like for her to follow up with Dr. Kirke CorinArida.   An appointment has been made for 7/23 with Dr. Kirke CorinArida. The patient is concerned that this may not be soon enough. Message has been routed to the provider for further recommendation.

## 2018-04-30 DIAGNOSIS — Z96651 Presence of right artificial knee joint: Secondary | ICD-10-CM | POA: Diagnosis not present

## 2018-04-30 DIAGNOSIS — M25561 Pain in right knee: Secondary | ICD-10-CM | POA: Diagnosis not present

## 2018-04-30 NOTE — Telephone Encounter (Signed)
We will have to recheck her pressure when she comes to see us.  That is not a serious situation.

## 2018-04-30 NOTE — Telephone Encounter (Signed)
Patient made aware and will keep her appointment with Dr. Kirke CorinArida on 05/12/18

## 2018-05-04 DIAGNOSIS — J301 Allergic rhinitis due to pollen: Secondary | ICD-10-CM | POA: Diagnosis not present

## 2018-05-05 DIAGNOSIS — Z96651 Presence of right artificial knee joint: Secondary | ICD-10-CM | POA: Diagnosis not present

## 2018-05-07 DIAGNOSIS — M25561 Pain in right knee: Secondary | ICD-10-CM | POA: Diagnosis not present

## 2018-05-07 DIAGNOSIS — Z96651 Presence of right artificial knee joint: Secondary | ICD-10-CM | POA: Diagnosis not present

## 2018-05-11 DIAGNOSIS — M25561 Pain in right knee: Secondary | ICD-10-CM | POA: Diagnosis not present

## 2018-05-11 DIAGNOSIS — Z96651 Presence of right artificial knee joint: Secondary | ICD-10-CM | POA: Diagnosis not present

## 2018-05-12 ENCOUNTER — Encounter: Payer: Self-pay | Admitting: Cardiovascular Disease

## 2018-05-12 ENCOUNTER — Ambulatory Visit: Payer: Medicare HMO | Admitting: Cardiovascular Disease

## 2018-05-12 VITALS — BP 146/84 | HR 86 | Ht 65.0 in | Wt 232.5 lb

## 2018-05-12 DIAGNOSIS — Z96651 Presence of right artificial knee joint: Secondary | ICD-10-CM | POA: Diagnosis not present

## 2018-05-12 DIAGNOSIS — I482 Chronic atrial fibrillation, unspecified: Secondary | ICD-10-CM

## 2018-05-12 DIAGNOSIS — Z79899 Other long term (current) drug therapy: Secondary | ICD-10-CM | POA: Diagnosis not present

## 2018-05-12 DIAGNOSIS — I10 Essential (primary) hypertension: Secondary | ICD-10-CM | POA: Diagnosis not present

## 2018-05-12 MED ORDER — LOSARTAN POTASSIUM 25 MG PO TABS: 25.0000 mg | ORAL_TABLET | Freq: Every day | ORAL | 1 refills | Status: DC

## 2018-05-12 NOTE — Progress Notes (Signed)
Cardiology Office Note   Date:  05/12/2018   ID:  Crystal Haas, DOB [birth year], MRN [id number]  PCP:  Glori Luis, MD  Cardiologist:   Lorine Bears, MD   Chief Complaint  Patient presents with  . OTHER    Hypertension. Meds reviewed verbally with pt. "doing well."       History of Present Illness: Crystal Haas is a 72 y.o. female who presents for a follow-up visit regarding  chronic atrial fibrillation and hypertension.  Echocardiogram in July, 2016 showed normal LV systolic function with mildly dilated right and left atrium.  She is tolerating anticoagulation with Eliquis.   She has known history of sleep apnea on CPAP.  She was seen most recently by Eula Listen in May for preoperative cardiovascular evaluation before knee surgery.  She underwent total knee replacement in June without complications.  The patient is requesting preoperative cardiovascular evaluation before bariatric gastric sleeve surgery.  The patient was also found to have discrepancy in blood pressure between 2 arms when she was seen most recently by Dr. Birdie Sons.  Blood pressure was 140/90 in the right arm and 170/90 in the left arm.  She denies any arm claudications.  She actually has been doing well and denies any chest pain or shortness of breath.  Her blood pressure has been running high.  Past Medical History:  Diagnosis Date  . Anemia    distant past  . Anxiety   . Arthritis    "everywhere" - big toes worst  . Chronic atrial fibrillation (HCC)    a. on eliquis; b. CHADS2VASc at least 2 (age x 1, female)  . Depression   . GERD (gastroesophageal reflux disease)    RARE  . Heart murmur    mild - followed by PCP  . Knee pain   . Motion sickness    back seat of car  . OSA on CPAP    CPAP-4 PSI  . Seasonal allergies    takes allergy weekly    Past Surgical History:  Procedure Laterality Date  . BREAST REDUCTION SURGERY Bilateral 08/29/2016   Procedure: BILATERAL MAMMARY REDUCTION   (BREAST)WITH LIPOSUCTION;  Surgeon: Peggye Form, DO;  Location: [facility];  Service: Plastics;  Laterality: Bilateral;  . CATARACT EXTRACTION W/PHACO Left 05/03/2015   Procedure: CATARACT EXTRACTION PHACO AND INTRAOCULAR LENS PLACEMENT (IOC);  Surgeon: Lockie Mola, MD;  Location: [facility];  Service: Ophthalmology;  Laterality: Left;  CPAP  . CATARACT EXTRACTION W/PHACO Right 10/09/2016   Procedure: CATARACT EXTRACTION PHACO AND INTRAOCULAR LENS PLACEMENT (IOC);  Surgeon: Lockie Mola, MD;  Location[facility];  Service: Ophthalmology;  Laterality: Right;  sleep apnea  . CHONDROPLASTY Right 04/29/2016   Procedure: CHONDROPLASTY;  Surgeon: Donato Heinz, MD;  Location: [facility];  Service: Orthopedics;  Laterality: Right;  . EYE SURGERY    . KNEE ARTHROPLASTY Right 03/25/2018   Procedure: COMPUTER ASSISTED TOTAL KNEE ARTHROPLASTY;  Surgeon: Donato Heinz, MD;  Location: [facility];  Service: Orthopedics;  Laterality: Right;  . KNEE ARTHROSCOPY WITH LATERAL MENISECTOMY  04/29/2016   Procedure: KNEE ARTHROSCOPY WITH LATERAL MENISECTOMY;  Surgeon: Donato Heinz, MD;  Location: [facility];  Service: Orthopedics;;  . KNEE ARTHROSCOPY WITH MEDIAL MENISECTOMY  04/29/2016   Procedure: KNEE ARTHROSCOPY WITH MEDIAL MENISECTOMY;  Surgeon: Donato Heinz, MD;  Location: [facility];  Service: Orthopedics;;  . REDUCTION MAMMAPLASTY Bilateral 09/2016  . RETINAL DETACHMENT SURGERY Left May 03, 2015  Dr. In[facility]Br[facility]ton, [facility]  . TUBAL LIGATION       Current Outpatient Medications  Medication Sig Dispense Refill  . acetaminophen (TYLENOL) 500 MG tablet Take 1,000 mg by mouth every 6 (six) hours as needed (for pain.).    Marland Kitchen. apixaban (ELIQUIS) 5 MG TABS tablet Take 1 tablet (5 mg total) by mouth 2 (two) times daily. 180 tablet 3  . atorvastatin (LIPITOR) 20 MG tablet TAKE 1 TABLET(20 MG) BY MOUTH DAILY (Patient taking differently: Take 20 mg by mouth every morning. )  90 tablet 2  . B Complex-C (B-COMPLEX WITH VITAMIN C) tablet Take 1 tablet by mouth daily.    . Cholecalciferol (VITAMIN D3) 2000 units TABS Take 2,000 Units by mouth daily.    . diphenhydrAMINE (BENADRYL) 25 MG tablet Take 25 mg by mouth every morning.     Marland Kitchen. EPIPEN 2-PAK 0.3 MG/0.3ML SOAJ injection Inject 0.3 mg as directed as directed. AS NEEDED FOR ANAPHYLAXIS     . fluticasone (FLONASE) 50 MCG/ACT nasal spray Place 2 sprays into both nostrils daily.     . Melatonin 10 MG TABS Take 30 mg by mouth at bedtime.    . montelukast (SINGULAIR) 10 MG tablet Take 10 mg by mouth at bedtime.    . Multiple Vitamin (MULTIVITAMIN WITH MINERALS) TABS tablet Take 1 tablet by mouth daily. One-A-Day Active 65+    . NON FORMULARY 10 each by Other route daily. GIN SOAKED RAISINS FOR PAIN RELIEF    . Olopatadine HCl (PATADAY) 0.2 % SOLN Place 1 drop into both eyes daily.    Trellis Moment. Papaya CHEW Chew 3-4 each by mouth as needed.    Marland Kitchen. PARoxetine (PAXIL) 20 MG tablet Take 1 tablet (20 mg total) by mouth daily. (Patient taking differently: Take 20 mg by mouth every morning. ) 90 tablet 2  . zolpidem (AMBIEN) 5 MG tablet TAKE 1 TABLET BY MOUTH AT BEDTIME AS NEEDED FOR SLEEP (Patient taking differently: TAKE 1 TABLET BY MOUTH AT BEDTIME) 90 tablet 0   No current facility-administered medications for this visit.     Allergies:   Apple; Daucus carota; Ivp dye [iodinated diagnostic agents]; Other; Strawberry (diagnostic); Strawberry extract; Iodine; and Tape    Social History:  The patient  reports that she quit smoking about 48 years ago. Her smoking use included cigarettes. She has a 0.50 pack-year smoking history. She has never used smokeless tobacco. She reports that she drinks about 4.2 oz of alcohol per week. She reports that she does not use drugs.      ROS:  Please see the history of present illness.   Otherwise, review of systems are positive for none.   All other systems are reviewed and negative.    PHYSICAL  EXAM: VS:  BP (!) 146/84 (BP Location: Right Arm, Patient Position: Sitting, Cuff Size: Large)   Pulse 86   Ht 5\' 5"  (1.651 m)   Wt 232 lb 8 oz (105.5 kg)   BMI 38.69 kg/m  , BMI Body mass index is 38.69 kg/m. GEN: Well nourished, well developed, in no acute distress  HEENT: normal  Neck: no JVD, carotid bruits, or masses Cardiac: Irregularly irregular; no murmurs, rubs, or gallops,no edema  Respiratory:  clear to auscultation bilaterally, normal work of breathing GI: soft, nontender, nondistended, + BS MS: no deformity or atrophy  Skin: warm and dry, no rash Neuro:  Strength and sensation are intact Psych: euthymic mood, full affect Radial and brachial pulses are normal and equal bilaterally.  EKG:  EKG  ordered today. EKG showed atrial fibrillation with ventricular rate of 86 bpm.  Nonspecific ST and T wave changes.  Low voltage.   Recent Labs: 04/20/2018: ALT 11; BUN 19; Creatinine, Ser 0.81; Hemoglobin 12.5; Platelets 286.0; Potassium 4.8; Sodium 142; TSH 2.63    Lipid Panel    Component Value Date/Time   CHOL 147 10/24/2017 1230   CHOL 186 09/04/2015 1051   TRIG 95.0 10/24/2017 1230   HDL 59.60 10/24/2017 1230   HDL 66 09/04/2015 1051   CHOLHDL 2 10/24/2017 1230   VLDL 19.0 10/24/2017 1230   LDLCALC 69 10/24/2017 1230   LDLCALC 99 09/04/2015 1051   LDLDIRECT 60.0 09/30/2016 1129      Wt Readings from Last 3 Encounters:  05/12/18 232 lb 8 oz (105.5 kg)  04/20/18 237 lb 9.6 oz (107.8 kg)  03/25/18 243 lb (110.2 kg)        ASSESSMENT AND PLAN:  1. Chronic atrial fibrillation: Ventricular rate is controlled without any medication. She is tolerating anticoagulation with no side effects.  2.  Essential hypertension: Her blood pressure has been running high recently on multiple occasions.  I elected to add losartan 25 mg once daily. We checked her blood pressure today in our office in both arms and it was 150/82 in the left arm and 146/84 in the right arm.  There  is no significant discrepancy between the 2.  Also she has no arm claudication and her pulses are normal and equal bilaterally.  Thus, I doubt that she has significant subclavian stenosis.  3.  Preop cardiovascular evaluation: She might need to have another knee surgery and no further work-up is needed before that.  If in the other hand she undergoes bariatric surgery, I recommend evaluation with a pharmacologic nuclear stress test.   Disposition:   FU with me in 6 months.  Signed,  Lorine Bears, MD  05/12/2018 4:13 PM    [facility]

## 2018-05-12 NOTE — Patient Instructions (Signed)
Medication Instructions: START Losartan 25 mg daily  If you need a refill on your cardiac medications before your next appointment, please call your pharmacy.   Labwork: Your provider would like for you to return in one week to have the following labs drawn: BMET. Please go to the [facility] entrance and check in at the front desk. You do not need an appointment.   Follow-Up: Your physician wants you to follow-up in 6 months with Dr. Arida. You will receive a reminder letter in the mail two months in advance. If you don't receive a letter, please call our office at [phone] to schedule this follow-up appointment.  Thank you for choosing [facility]!    

## 2018-05-14 DIAGNOSIS — M25561 Pain in right knee: Secondary | ICD-10-CM | POA: Diagnosis not present

## 2018-05-14 DIAGNOSIS — Z96651 Presence of right artificial knee joint: Secondary | ICD-10-CM | POA: Diagnosis not present

## 2018-05-18 DIAGNOSIS — J301 Allergic rhinitis due to pollen: Secondary | ICD-10-CM | POA: Diagnosis not present

## 2018-05-20 DIAGNOSIS — Z96651 Presence of right artificial knee joint: Secondary | ICD-10-CM | POA: Diagnosis not present

## 2018-05-26 ENCOUNTER — Ambulatory Visit: Payer: Medicare HMO

## 2018-05-27 ENCOUNTER — Ambulatory Visit: Payer: Medicare HMO | Admitting: Dietician

## 2018-06-01 DIAGNOSIS — J301 Allergic rhinitis due to pollen: Secondary | ICD-10-CM | POA: Diagnosis not present

## 2018-06-02 DIAGNOSIS — M25561 Pain in right knee: Secondary | ICD-10-CM | POA: Diagnosis not present

## 2018-06-02 DIAGNOSIS — Z96651 Presence of right artificial knee joint: Secondary | ICD-10-CM | POA: Diagnosis not present

## 2018-06-03 ENCOUNTER — Other Ambulatory Visit: Payer: Self-pay | Admitting: Family Medicine

## 2018-06-04 NOTE — Telephone Encounter (Signed)
Sent to pharmacy.  Controlled substance database reviewed. 

## 2018-06-04 NOTE — Telephone Encounter (Signed)
Last OV 04/20/18 last filled 02/24/18 90 0rf

## 2018-06-08 DIAGNOSIS — J301 Allergic rhinitis due to pollen: Secondary | ICD-10-CM | POA: Diagnosis not present

## 2018-06-10 ENCOUNTER — Ambulatory Visit: Payer: Medicare HMO | Admitting: Nurse Practitioner

## 2018-06-10 ENCOUNTER — Encounter

## 2018-06-15 DIAGNOSIS — J301 Allergic rhinitis due to pollen: Secondary | ICD-10-CM | POA: Diagnosis not present

## 2018-06-16 DIAGNOSIS — G4733 Obstructive sleep apnea (adult) (pediatric): Secondary | ICD-10-CM | POA: Diagnosis not present

## 2018-06-17 ENCOUNTER — Ambulatory Visit: Payer: Medicare HMO | Admitting: Dietician

## 2018-06-25 DIAGNOSIS — G4733 Obstructive sleep apnea (adult) (pediatric): Secondary | ICD-10-CM | POA: Diagnosis not present

## 2018-06-29 DIAGNOSIS — J301 Allergic rhinitis due to pollen: Secondary | ICD-10-CM | POA: Diagnosis not present

## 2018-07-08 DIAGNOSIS — J301 Allergic rhinitis due to pollen: Secondary | ICD-10-CM | POA: Diagnosis not present

## 2018-07-13 DIAGNOSIS — J301 Allergic rhinitis due to pollen: Secondary | ICD-10-CM | POA: Diagnosis not present

## 2018-07-17 ENCOUNTER — Telehealth: Payer: Self-pay | Admitting: *Deleted

## 2018-07-17 DIAGNOSIS — G4733 Obstructive sleep apnea (adult) (pediatric): Secondary | ICD-10-CM | POA: Diagnosis not present

## 2018-07-17 NOTE — Telephone Encounter (Signed)
Patient called and reminded to get lab work completed. She stated that she will go next week to [facility]

## 2018-07-20 DIAGNOSIS — J301 Allergic rhinitis due to pollen: Secondary | ICD-10-CM | POA: Diagnosis not present

## 2018-07-21 ENCOUNTER — Other Ambulatory Visit
Admission: RE | Admit: 2018-07-21 | Discharge: 2018-07-21 | Disposition: A | Discharge status: Home / Self Care | Payer: Medicare HMO | Source: Ambulatory Visit | Attending: Cardiovascular Disease | Admitting: Cardiovascular Disease

## 2018-07-21 DIAGNOSIS — M1712 Unilateral primary osteoarthritis, left knee: Secondary | ICD-10-CM | POA: Diagnosis not present

## 2018-07-21 DIAGNOSIS — Z79899 Other long term (current) drug therapy: Secondary | ICD-10-CM | POA: Diagnosis not present

## 2018-07-21 DIAGNOSIS — Z96651 Presence of right artificial knee joint: Secondary | ICD-10-CM | POA: Diagnosis not present

## 2018-07-21 DIAGNOSIS — I482 Chronic atrial fibrillation, unspecified: Secondary | ICD-10-CM | POA: Insufficient documentation

## 2018-07-21 LAB — BASIC METABOLIC PANEL
Anion gap: 7 (ref 5–15)
BUN: 20 mg/dL (ref 8–23)
CHLORIDE: 105 mmol/L (ref 98–111)
CO2: 29 mmol/L (ref 22–32)
CREATININE: 0.92 mg/dL (ref 0.44–1.00)
Calcium: 9 mg/dL (ref 8.9–10.3)
GFR calc Af Amer: >60 mL/min (ref >60)
Glucose, Bld: 101 mg/dL — ABNORMAL HIGH (ref 70–99)
POTASSIUM: 4.4 mmol/L (ref 3.5–5.1)
SODIUM: 141 mmol/L (ref 135–145)

## 2018-07-23 ENCOUNTER — Telehealth: Payer: Self-pay | Admitting: *Deleted

## 2018-07-23 NOTE — Telephone Encounter (Signed)
Patient made aware of results and verbalized understanding.  

## 2018-07-23 NOTE — Telephone Encounter (Signed)
Left a message for the patient to call back.  

## 2018-07-23 NOTE — Telephone Encounter (Signed)
-----   Message from Iran Ouch, MD sent at 07/22/2018  5:29 PM EDT ----- Inform patient that labs were normal.

## 2018-07-27 DIAGNOSIS — J301 Allergic rhinitis due to pollen: Secondary | ICD-10-CM | POA: Diagnosis not present

## 2018-08-03 DIAGNOSIS — J301 Allergic rhinitis due to pollen: Secondary | ICD-10-CM | POA: Diagnosis not present

## 2018-08-10 DIAGNOSIS — J301 Allergic rhinitis due to pollen: Secondary | ICD-10-CM | POA: Diagnosis not present

## 2018-08-24 DIAGNOSIS — J301 Allergic rhinitis due to pollen: Secondary | ICD-10-CM | POA: Diagnosis not present

## 2018-08-31 DIAGNOSIS — J301 Allergic rhinitis due to pollen: Secondary | ICD-10-CM | POA: Diagnosis not present

## 2018-09-03 ENCOUNTER — Other Ambulatory Visit: Payer: Self-pay | Admitting: Family Medicine

## 2018-09-04 NOTE — Telephone Encounter (Signed)
Controlled substance database reviewed. Sent to pharmacy.   

## 2018-09-07 DIAGNOSIS — J301 Allergic rhinitis due to pollen: Secondary | ICD-10-CM | POA: Diagnosis not present

## 2018-09-14 DIAGNOSIS — J301 Allergic rhinitis due to pollen: Secondary | ICD-10-CM | POA: Diagnosis not present

## 2018-09-21 DIAGNOSIS — J301 Allergic rhinitis due to pollen: Secondary | ICD-10-CM | POA: Diagnosis not present

## 2018-09-28 DIAGNOSIS — J301 Allergic rhinitis due to pollen: Secondary | ICD-10-CM | POA: Diagnosis not present

## 2018-09-30 DIAGNOSIS — J301 Allergic rhinitis due to pollen: Secondary | ICD-10-CM | POA: Diagnosis not present

## 2018-10-05 DIAGNOSIS — J301 Allergic rhinitis due to pollen: Secondary | ICD-10-CM | POA: Diagnosis not present

## 2018-10-12 DIAGNOSIS — J301 Allergic rhinitis due to pollen: Secondary | ICD-10-CM | POA: Diagnosis not present

## 2018-10-19 DIAGNOSIS — M1712 Unilateral primary osteoarthritis, left knee: Secondary | ICD-10-CM | POA: Diagnosis not present

## 2018-10-19 DIAGNOSIS — J301 Allergic rhinitis due to pollen: Secondary | ICD-10-CM | POA: Diagnosis not present

## 2018-10-22 ENCOUNTER — Other Ambulatory Visit: Payer: Self-pay

## 2018-10-22 ENCOUNTER — Encounter: Payer: Self-pay | Admitting: Emergency Medicine

## 2018-10-22 DIAGNOSIS — K648 Other hemorrhoids: Secondary | ICD-10-CM | POA: Diagnosis not present

## 2018-10-22 DIAGNOSIS — G4733 Obstructive sleep apnea (adult) (pediatric): Secondary | ICD-10-CM | POA: Insufficient documentation

## 2018-10-22 DIAGNOSIS — M1990 Unspecified osteoarthritis, unspecified site: Secondary | ICD-10-CM | POA: Diagnosis not present

## 2018-10-22 DIAGNOSIS — Z6839 Body mass index (BMI) 39.0-39.9, adult: Secondary | ICD-10-CM | POA: Insufficient documentation

## 2018-10-22 DIAGNOSIS — K922 Gastrointestinal hemorrhage, unspecified: Secondary | ICD-10-CM | POA: Diagnosis not present

## 2018-10-22 DIAGNOSIS — Z91018 Allergy to other foods: Secondary | ICD-10-CM | POA: Insufficient documentation

## 2018-10-22 DIAGNOSIS — Z9841 Cataract extraction status, right eye: Secondary | ICD-10-CM | POA: Diagnosis not present

## 2018-10-22 DIAGNOSIS — Z87891 Personal history of nicotine dependence: Secondary | ICD-10-CM | POA: Insufficient documentation

## 2018-10-22 DIAGNOSIS — F329 Major depressive disorder, single episode, unspecified: Secondary | ICD-10-CM | POA: Diagnosis not present

## 2018-10-22 DIAGNOSIS — F419 Anxiety disorder, unspecified: Secondary | ICD-10-CM | POA: Insufficient documentation

## 2018-10-22 DIAGNOSIS — Z961 Presence of intraocular lens: Secondary | ICD-10-CM | POA: Diagnosis not present

## 2018-10-22 DIAGNOSIS — Z96651 Presence of right artificial knee joint: Secondary | ICD-10-CM | POA: Insufficient documentation

## 2018-10-22 DIAGNOSIS — I10 Essential (primary) hypertension: Secondary | ICD-10-CM | POA: Insufficient documentation

## 2018-10-22 DIAGNOSIS — I482 Chronic atrial fibrillation, unspecified: Secondary | ICD-10-CM | POA: Insufficient documentation

## 2018-10-22 DIAGNOSIS — Z87892 Personal history of anaphylaxis: Secondary | ICD-10-CM | POA: Insufficient documentation

## 2018-10-22 DIAGNOSIS — Z888 Allergy status to other drugs, medicaments and biological substances status: Secondary | ICD-10-CM | POA: Diagnosis not present

## 2018-10-22 DIAGNOSIS — K921 Melena: Secondary | ICD-10-CM | POA: Diagnosis not present

## 2018-10-22 DIAGNOSIS — Z7901 Long term (current) use of anticoagulants: Secondary | ICD-10-CM | POA: Insufficient documentation

## 2018-10-22 DIAGNOSIS — Z91041 Radiographic dye allergy status: Secondary | ICD-10-CM | POA: Diagnosis not present

## 2018-10-22 DIAGNOSIS — Z79899 Other long term (current) drug therapy: Secondary | ICD-10-CM | POA: Diagnosis not present

## 2018-10-22 DIAGNOSIS — Z9842 Cataract extraction status, left eye: Secondary | ICD-10-CM | POA: Diagnosis not present

## 2018-10-22 DIAGNOSIS — K625 Hemorrhage of anus and rectum: Secondary | ICD-10-CM | POA: Diagnosis not present

## 2018-10-22 DIAGNOSIS — K5730 Diverticulosis of large intestine without perforation or abscess without bleeding: Secondary | ICD-10-CM | POA: Diagnosis not present

## 2018-10-22 LAB — CBC WITH DIFFERENTIAL/PLATELET
Abs Immature Granulocytes: 0.18 10*3/uL — ABNORMAL HIGH (ref 0.00–0.07)
BASOS ABS: 0.1 10*3/uL (ref 0.0–0.1)
Basophils Relative: 1 %
EOS PCT: 3 %
Eosinophils Absolute: 0.5 10*3/uL (ref 0.0–0.5)
HCT: 43.3 % (ref 36.0–46.0)
Hemoglobin: 13.9 g/dL (ref 12.0–15.0)
IMMATURE GRANULOCYTES: 1 %
Lymphocytes Relative: 17 %
Lymphs Abs: 2.8 10*3/uL (ref 0.7–4.0)
MCH: 29.6 pg (ref 26.0–34.0)
MCHC: 32.1 g/dL (ref 30.0–36.0)
MCV: 92.3 fL (ref 80.0–100.0)
Monocytes Absolute: 1.9 10*3/uL — ABNORMAL HIGH (ref 0.1–1.0)
Monocytes Relative: 12 %
NRBC: 0 % (ref 0.0–0.2)
Neutro Abs: 10.8 10*3/uL — ABNORMAL HIGH (ref 1.7–7.7)
Neutrophils Relative %: 66 %
Platelets: 275 10*3/uL (ref 150–400)
RBC: 4.69 MIL/uL (ref 3.87–5.11)
RDW: 14 % (ref 11.5–15.5)
WBC: 16.3 10*3/uL — AB (ref 4.0–10.5)

## 2018-10-22 LAB — COMPREHENSIVE METABOLIC PANEL
ALBUMIN: 4.2 g/dL (ref 3.5–5.0)
ALT: 25 U/L (ref 0–44)
AST: 20 U/L (ref 15–41)
Alkaline Phosphatase: 69 U/L (ref 38–126)
Anion gap: 9 (ref 5–15)
BILIRUBIN TOTAL: 1 mg/dL (ref 0.3–1.2)
BUN: 18 mg/dL (ref 8–23)
CHLORIDE: 105 mmol/L (ref 98–111)
CO2: 27 mmol/L (ref 22–32)
Calcium: 9.1 mg/dL (ref 8.9–10.3)
Creatinine, Ser: 0.97 mg/dL (ref 0.44–1.00)
GFR calc Af Amer: >60 mL/min (ref >60)
GFR calc non Af Amer: 59 mL/min — ABNORMAL LOW (ref >60)
GLUCOSE: 133 mg/dL — AB (ref 70–99)
POTASSIUM: 4 mmol/L (ref 3.5–5.1)
Sodium: 141 mmol/L (ref 135–145)
TOTAL PROTEIN: 7.3 g/dL (ref 6.5–8.1)

## 2018-10-22 LAB — TYPE AND SCREEN
ABO/RH(D): A POS
ANTIBODY SCREEN: NEGATIVE

## 2018-10-22 LAB — LIPASE, BLOOD: Lipase: 30 U/L (ref 11–51)

## 2018-10-22 NOTE — ED Notes (Signed)
Pt states she "filled the toilet with blood" just now. Was encouraged to let this RN know if it happens again so we can chart it. In NAD.

## 2018-10-22 NOTE — ED Triage Notes (Signed)
Patient ambulatory to triage with steady gait, without difficulty or distress noted; pt reports rectal bleeding x 2; denies hx, denies pain or accomp symptoms

## 2018-10-23 ENCOUNTER — Observation Stay
Admission: EM | Admit: 2018-10-23 | Discharge: 2018-10-24 | Disposition: A | Discharge status: Home / Self Care | Payer: PPO | Attending: Internal Medicine | Admitting: Internal Medicine

## 2018-10-23 ENCOUNTER — Other Ambulatory Visit: Payer: Self-pay

## 2018-10-23 DIAGNOSIS — R739 Hyperglycemia, unspecified: Secondary | ICD-10-CM | POA: Diagnosis not present

## 2018-10-23 DIAGNOSIS — K5730 Diverticulosis of large intestine without perforation or abscess without bleeding: Secondary | ICD-10-CM

## 2018-10-23 DIAGNOSIS — K625 Hemorrhage of anus and rectum: Secondary | ICD-10-CM

## 2018-10-23 DIAGNOSIS — K922 Gastrointestinal hemorrhage, unspecified: Secondary | ICD-10-CM

## 2018-10-23 DIAGNOSIS — K921 Melena: Secondary | ICD-10-CM | POA: Diagnosis not present

## 2018-10-23 DIAGNOSIS — Z7901 Long term (current) use of anticoagulants: Secondary | ICD-10-CM | POA: Diagnosis not present

## 2018-10-23 LAB — URINALYSIS, COMPLETE (UACMP) WITH MICROSCOPIC
BACTERIA UA: NONE SEEN
BILIRUBIN URINE: NEGATIVE
Glucose, UA: NEGATIVE mg/dL
HGB URINE DIPSTICK: NEGATIVE
KETONES UR: NEGATIVE mg/dL
LEUKOCYTES UA: NEGATIVE
NITRITE: NEGATIVE
PROTEIN: NEGATIVE mg/dL
Specific Gravity, Urine: 1.01 (ref 1.005–1.030)
pH: 7 (ref 5.0–8.0)

## 2018-10-23 LAB — MAGNESIUM: Magnesium: 2.4 mg/dL (ref 1.7–2.4)

## 2018-10-23 LAB — HEMOGLOBIN AND HEMATOCRIT, BLOOD
HCT: 40 % (ref 36.0–46.0)
HEMATOCRIT: 36.9 % (ref 36.0–46.0)
Hemoglobin: 11.9 g/dL — ABNORMAL LOW (ref 12.0–15.0)
Hemoglobin: 12.8 g/dL (ref 12.0–15.0)

## 2018-10-23 LAB — HEMOGLOBIN: Hemoglobin: 12.1 g/dL (ref 12.0–15.0)

## 2018-10-23 LAB — PHOSPHORUS: Phosphorus: 3 mg/dL (ref 2.5–4.6)

## 2018-10-23 MED ORDER — LOSARTAN POTASSIUM 25 MG PO TABS
25.0000 mg | ORAL_TABLET | Freq: Every day | ORAL | Status: DC
  Administered 2018-10-23 – 2018-10-24 (×2): 25 mg via ORAL
  Filled 2018-10-23 (×2): qty 1

## 2018-10-23 MED ORDER — SODIUM CHLORIDE 0.9 % IV BOLUS
1000.0000 mL | Freq: Once | INTRAVENOUS | Status: AC
  Administered 2018-10-23: 1000 mL via INTRAVENOUS

## 2018-10-23 MED ORDER — SODIUM CHLORIDE 0.9% FLUSH
10.0000 mL | Freq: Two times a day (BID) | INTRAVENOUS | Status: DC
  Administered 2018-10-23: 10 mL via INTRAVENOUS

## 2018-10-23 MED ORDER — ACETAMINOPHEN 325 MG PO TABS: 650.0000 mg | ORAL_TABLET | Freq: Four times a day (QID) | ORAL | Status: DC | PRN

## 2018-10-23 MED ORDER — ONDANSETRON HCL 4 MG/2ML IJ SOLN: 4.0000 mg | Freq: Four times a day (QID) | INTRAMUSCULAR | Status: DC | PRN

## 2018-10-23 MED ORDER — SENNOSIDES-DOCUSATE SODIUM 8.6-50 MG PO TABS: 1.0000 | ORAL_TABLET | Freq: Every evening | ORAL | Status: DC | PRN

## 2018-10-23 MED ORDER — LACTATED RINGERS IV SOLN
INTRAVENOUS | Status: AC
  Administered 2018-10-23: 03:00:00 via INTRAVENOUS

## 2018-10-23 MED ORDER — BISACODYL 5 MG PO TBEC
5.0000 mg | DELAYED_RELEASE_TABLET | Freq: Every day | ORAL | Status: DC | PRN
  Filled 2018-10-23: qty 1

## 2018-10-23 MED ORDER — MONTELUKAST SODIUM 10 MG PO TABS
10.0000 mg | ORAL_TABLET | Freq: Every day | ORAL | Status: DC
  Administered 2018-10-23: 10 mg via ORAL
  Filled 2018-10-23 (×2): qty 1

## 2018-10-23 MED ORDER — ONDANSETRON HCL 4 MG PO TABS: 4.0000 mg | ORAL_TABLET | Freq: Four times a day (QID) | ORAL | Status: DC | PRN

## 2018-10-23 MED ORDER — ZOLPIDEM TARTRATE 5 MG PO TABS
5.0000 mg | ORAL_TABLET | Freq: Every day | ORAL | Status: DC
  Administered 2018-10-23: 5 mg via ORAL
  Filled 2018-10-23: qty 1

## 2018-10-23 MED ORDER — ATORVASTATIN CALCIUM 20 MG PO TABS
20.0000 mg | ORAL_TABLET | ORAL | Status: DC
  Administered 2018-10-23: 20 mg via ORAL
  Filled 2018-10-23: qty 1

## 2018-10-23 MED ORDER — ACETAMINOPHEN 650 MG RE SUPP: 650.0000 mg | Freq: Four times a day (QID) | RECTAL | Status: DC | PRN

## 2018-10-23 MED ORDER — PAROXETINE HCL 20 MG PO TABS
20.0000 mg | ORAL_TABLET | ORAL | Status: DC
  Administered 2018-10-23: 20 mg via ORAL
  Filled 2018-10-23 (×3): qty 1

## 2018-10-23 MED ORDER — POLYETHYLENE GLYCOL 3350 17 GM/SCOOP PO POWD
1.0000 | Freq: Once | ORAL | Status: AC
  Administered 2018-10-23: 255 g via ORAL
  Filled 2018-10-23 (×2): qty 255

## 2018-10-23 NOTE — H&P (Signed)
[facility]   PATIENT NAME: Crystal LollDiane Avellino    MR#:  [id number]  DATE OF BIRTH:  05/02/1946  DATE OF ADMISSION:  10/23/2018  PRIMARY CARE PHYSICIAN: Glori LuisSonnenberg, Eric G, MD   REQUESTING/REFERRING PHYSICIAN: Irean HongSung, Jade J, MD  CHIEF COMPLAINT:   Chief Complaint  Patient presents with  . Rectal Bleeding    HISTORY OF PRESENT ILLNESS:  Crystal Haas  is a 73 y.o. female with a known history of Afib (Eliquis) p/w 1d Hx BRBPR/hematochezia. Pt states that she went out to dinner. She notes having early satiety, which she states is not normal. She states she went home, and felt as though she was going to have diarrhea. She sat down on the toilet, and passed BRBPR + clots. She characterizes this as large-volume hematochezia. She endorses two episodes at home, after which she came to the ED. She states she lives alone, and, "didn't want to bleed to death". She had a third (unwitnessed) episode of hematochezia in the ED restroom, and a fourth witnessed episode, seen by nursing staff. This was quantified as ~250-500cc bright blood, no stool. Pt states she had a colonoscopy in 2014 (@ [facility], while she lived in [place] (+) polyps (removed) + early diverticulosis. She denies AP/N/V. She is on Eliquis. She is well-appearing and in no distress. She is not pale. Initial Hgb 13.9. She denies SOB, fatigue/malaise/generalized weakness.  PAST MEDICAL HISTORY:   Past Medical History:  Diagnosis Date  . Anemia    distant past  . Anxiety   . Arthritis    "everywhere" - big toes worst  . Chronic atrial fibrillation    a. on eliquis; b. CHADS2VASc at least 2 (age x 1, female)  . Depression   . GERD (gastroesophageal reflux disease)    RARE  . Heart murmur    mild - followed by PCP  . Knee pain   . Motion sickness    back seat of car  . OSA on CPAP    CPAP-4 PSI  . Seasonal allergies    takes allergy weekly    PAST SURGICAL HISTORY:   Past Surgical History:    Procedure Laterality Date  . BREAST REDUCTION SURGERY Bilateral 08/29/2016   Procedure: BILATERAL MAMMARY REDUCTION  (BREAST)WITH LIPOSUCTION;  Surgeon: Peggye Formlaire S Dillingham, DO;  Location: [facility];  Service: Plastics;  Laterality: Bilateral;  . CATARACT EXTRACTION W/PHACO Left 05/03/2015   Procedure: CATARACT EXTRACTION PHACO AND INTRAOCULAR LENS PLACEMENT (IOC);  Surgeon: Lockie Molahadwick Brasington, MD;  [facility];  Service: Ophthalmology;  Laterality: Left;  CPAP  . CATARACT EXTRACTION W/PHACO Right 10/09/2016   Procedure: CATARACT EXTRACTION PHACO AND INTRAOCULAR LENS PLACEMENT (IOC);  Surgeon: Lockie Molahadwick Brasington, MD;  [facility];  Service: Ophthalmology;  Laterality: Right;  sleep apnea  . CHONDROPLASTY Right 04/29/2016   Procedure: CHONDROPLASTY;  Surgeon: Donato HeinzJames P Hooten, MD;  Location: [facility];  Service: Orthopedics;  Laterality: Right;  . EYE SURGERY    . KNEE ARTHROPLASTY Right 03/25/2018   Procedure: COMPUTER ASSISTED TOTAL KNEE ARTHROPLASTY;  Surgeon: Donato HeinzHooten, James P, MD;  Location: [facility];  Service: Orthopedics;  Laterality: Right;  . KNEE ARTHROSCOPY WITH LATERAL MENISECTOMY  04/29/2016   Procedure: KNEE ARTHROSCOPY WITH LATERAL MENISECTOMY;  Surgeon: Donato HeinzJames P Hooten, MD;  Location: [facility];  Service: Orthopedics;;  . KNEE ARTHROSCOPY WITH MEDIAL MENISECTOMY  04/29/2016   Procedure: KNEE ARTHROSCOPY WITH MEDIAL MENISECTOMY;  Surgeon: Donato HeinzJames P Hooten, MD;  Location: [facility];  Service: Orthopedics;;  . REDUCTION MAMMAPLASTY Bilateral 09/2016  . RETINAL DETACHMENT SURGERY Left May 03, 2015   Dr. Inez Pilgrim, [facility]  . TUBAL LIGATION      SOCIAL HISTORY:   Social History   Tobacco Use  . Smoking status: Former Smoker    Packs/day: 0.25    Years: 2.00    Pack years: 0.50    Types: Cigarettes    Last attempt to quit: 10/21/1969    Years since quitting: 49.0  . Smokeless tobacco: Never Used  Substance Use Topics  . Alcohol use: Yes     Alcohol/week: 7.0 standard drinks    Types: 7 Shots of liquor per week    Comment: SOCIALLY    FAMILY HISTORY:   Family History  Problem Relation Age of Onset  . Alcoholism Other   . Heart disease Other   . Breast cancer Neg Hx     DRUG ALLERGIES:   Allergies  Allergen Reactions  . Apple Anaphylaxis    Throat swells but subsides with po benadry  . Daucus Carota Anaphylaxis    Peeling of carrot only but subsides with po benadry  . Ivp Dye [Iodinated Diagnostic Agents] Shortness Of Breath    Also swelling.  Topical betadine is OK.  . Other Swelling    Raw fruits cause throat to swell. Topical contact with carrots cause hands to swell, ok to eat. Pt can eat citrus and blueberries with no problems  . Strawberry (Diagnostic) Anaphylaxis    Throat swells but subsides with benadryl  . Strawberry Extract Anaphylaxis    Throat swells but subsides with benadryl  . Iodine Swelling    IV   . Tape Other (See Comments)    Most tapes case raw skin.  Paper tape is OK.    REVIEW OF SYSTEMS:   Review of Systems  Constitutional: Negative for chills, diaphoresis, fever, malaise/fatigue and weight loss.  HENT: Negative for congestion, ear pain, hearing loss, nosebleeds, sinus pain, sore throat and tinnitus.   Eyes: Negative for blurred vision, double vision and photophobia.  Respiratory: Negative for cough, hemoptysis, sputum production, shortness of breath and wheezing.   Cardiovascular: Negative for chest pain, palpitations, orthopnea, claudication, leg swelling and PND.  Gastrointestinal: Positive for blood in stool. Negative for abdominal pain, constipation, diarrhea, heartburn, melena, nausea and vomiting.  Genitourinary: Negative for dysuria, frequency, hematuria and urgency.  Musculoskeletal: Negative for back pain, joint pain, myalgias and neck pain.  Skin: Negative for itching and rash.  Neurological: Negative for dizziness, tingling, tremors, sensory change, speech change,  focal weakness, seizures, loss of consciousness, weakness and headaches.  Psychiatric/Behavioral: Negative for depression and memory loss. The patient is not nervous/anxious and does not have insomnia.    MEDICATIONS AT HOME:   Prior to Admission medications   Medication Sig Start Date End Date Taking? Authorizing Provider  apixaban (ELIQUIS) 5 MG TABS tablet Take 1 tablet (5 mg total) by mouth 2 (two) times daily. 12/08/17  Yes Iran Ouch, MD  atorvastatin (LIPITOR) 20 MG tablet TAKE 1 TABLET(20 MG) BY MOUTH DAILY Patient taking differently: Take 20 mg by mouth every morning.  11/25/17  Yes Glori Luis, MD  B Complex-C (B-COMPLEX WITH VITAMIN C) tablet Take 1 tablet by mouth daily.   Yes [provider]  cetirizine (ZYRTEC) 10 MG tablet Take 10 mg by mouth daily.   Yes [provider]  Cholecalciferol (VITAMIN D3) 2000 units TABS Take 2,000 Units by mouth  daily.   Yes [provider]  CYANOCOBALAMIN PO Take 1 tablet by mouth daily.   Yes [provider]  EPIPEN 2-PAK 0.3 MG/0.3ML SOAJ injection Inject 0.3 mg as directed as directed. AS NEEDED FOR ANAPHYLAXIS  12/04/15  Yes [provider]  fluticasone (FLONASE) 50 MCG/ACT nasal spray Place 2 sprays into both nostrils daily.  03/07/16  Yes [provider]  losartan (COZAAR) 25 MG tablet Take 1 tablet (25 mg total) by mouth daily. 05/12/18 10/23/18 Yes Iran Ouch, MD  montelukast (SINGULAIR) 10 MG tablet Take 10 mg by mouth at bedtime.   Yes [provider]  Multiple Vitamin (MULTIVITAMIN WITH MINERALS) TABS tablet Take 1 tablet by mouth daily. One-A-Day Active 65+   Yes [provider]  NON FORMULARY 10 each by Other route daily. GIN SOAKED RAISINS FOR PAIN RELIEF   Yes [provider]  Olopatadine HCl (PATADAY) 0.2 % SOLN Place 1 drop into both eyes daily.   Yes [provider]  Papaya CHEW Chew 3-4 each by mouth as needed.   Yes [provider]  PARoxetine (PAXIL) 20 MG tablet Take 1 tablet (20 mg total) by mouth daily. Patient taking differently: Take 20 mg by mouth every morning.  11/25/17  Yes Glori Luis, MD  zolpidem (AMBIEN) 5 MG tablet TAKE 1 TABLET BY MOUTH AT BEDTIME AS NEEDED FOR SLEEP 09/04/18  Yes Glori Luis, MD  acetaminophen (TYLENOL) 500 MG tablet Take 1,000 mg by mouth every 6 (six) hours as needed (for pain.).    [provider]  diphenhydrAMINE (BENADRYL) 25 MG tablet Take 25 mg by mouth every morning.     [provider]  Melatonin 10 MG TABS Take 30 mg by mouth at bedtime.    [provider]      VITAL SIGNS:  Blood pressure (!) 163/87, pulse 79, temperature 97.7 F (36.5 C), temperature source Oral, resp. rate 13, height 5\' 4"  (1.626 m), weight 104.3 kg, SpO2 96 %.  PHYSICAL EXAMINATION:  Physical Exam Constitutional:      General: She is awake. She is not in acute distress.    Appearance: Normal appearance. She is well-developed. She is obese. She is not ill-appearing, toxic-appearing or diaphoretic.     Interventions: She is not intubated. HENT:     Head: Normocephalic and atraumatic.     Mouth/Throat:     Mouth: Mucous membranes are moist.     Pharynx: Oropharynx is clear.  Eyes:     General: Lids are normal. No scleral icterus.    Extraocular Movements: Extraocular movements intact.     Conjunctiva/sclera: Conjunctivae normal.  Neck:     Musculoskeletal: Neck supple.  Cardiovascular:     Rate and Rhythm: Normal rate. Rhythm irregular.     Heart sounds: S1 normal and S2 normal. Heart sounds not distant. Murmur present. Systolic murmur present with a grade of 2/6. No diastolic murmur. No friction rub. No gallop. No S3 or S4 sounds.   Pulmonary:     Effort: Pulmonary effort is normal. No tachypnea, bradypnea, accessory muscle usage, prolonged expiration, respiratory distress or retractions. She is not intubated.     Breath sounds: Normal breath  sounds and air entry. No stridor, decreased air movement or transmitted upper airway sounds. No decreased breath sounds, wheezing, rhonchi or rales.  Abdominal:     General: Abdomen is flat. Bowel sounds are decreased. There is no distension.     Palpations: Abdomen is soft.  Tenderness: There is no abdominal tenderness. There is no guarding or rebound.  Musculoskeletal: Normal range of motion.        General: No swelling or tenderness.     Right lower leg: No edema.     Left lower leg: No edema.  Lymphadenopathy:     Cervical: No cervical adenopathy.  Skin:    General: Skin is warm and dry.     Coloration: Skin is not pale.     Findings: No erythema or rash.  Neurological:     Mental Status: She is alert and oriented to person, place, and time.  Psychiatric:        Attention and Perception: Attention and perception normal.        Mood and Affect: Mood and affect normal.        Speech: Speech normal.        Behavior: Behavior normal. Behavior is cooperative.        Thought Content: Thought content normal.        Cognition and Memory: Cognition and memory normal.        Judgment: Judgment normal.    LABORATORY PANEL:   CBC Recent Labs  Lab 10/22/18 2107  WBC 16.3*  HGB 13.9  HCT 43.3  PLT 275   ------------------------------------------------------------------------------------------------------------------  Chemistries  Recent Labs  Lab 10/22/18 2107  NA 141  K 4.0  CL 105  CO2 27  GLUCOSE 133*  BUN 18  CREATININE 0.97  CALCIUM 9.1  MG 2.4  AST 20  ALT 25  ALKPHOS 69  BILITOT 1.0   ------------------------------------------------------------------------------------------------------------------  Cardiac Enzymes No results for input(s): TROPONINI in the last 168 hours. ------------------------------------------------------------------------------------------------------------------  RADIOLOGY:  No results found.  IMPRESSION AND PLAN:   A/P: 61F  w/ PMHx Afib (Eliquis) p/w 1d Hx BRBPR/hematochezia. Hyperglycemia, leukocytosis. -BRBPR/hematochezia: Pt p/w 1d Hx rectal bleeding, 2x ep @ home, [facility]. 1x ep witnessed, estimated ~250-500cc. States she had colonoscopy in 2014 (@ [facility]), (+) polyps + early diverticulosis. Present bleed etiology unclear (angiodysplasia/AVM, Ca, diverticulosis, polyps); bleeding scan vs. Colonoscopy. GI consult. Monitor Hgb. Hold Eliquis. -c/w other home meds/formulary subs. -FEN/GI: NPO. -DVT PPx: SCDs, no pharmacological PPx (active bleeding). -Code status: Full code. -Disposition: Observation, < 2 midnights. Continued bleeding, Hgb drop, need for further imaging/procedure will warrant change to inpatient admission.   All the records are reviewed and case discussed with ED provider. Management plans discussed with the patient, family and they are in agreement.  CODE STATUS: Full code.  TOTAL TIME TAKING CARE OF THIS PATIENT: 75 minutes.    Barbaraann RondoPrasanna Inge Waldroup M.D on 10/23/2018 at 2:52 AM  Between 7am to 6pm - Pager - [phone]  After 6pm go to [email or web address] - Scientist, research (life sciences)password [email or web address] [facility]  [facility]  Office  [phone]  CC: Primary care physician; Glori LuisSonnenberg, Eric G, MD   Note: This dictation was prepared with Dragon dictation along with smaller phrase technology. Any transcriptional errors that result from this process are unintentional.

## 2018-10-23 NOTE — ED Notes (Signed)
Admitting provider at bedside.

## 2018-10-23 NOTE — ED Notes (Signed)
EDP Dr. Dolores FrameSung in room.

## 2018-10-23 NOTE — ED Notes (Signed)
Pt assisted to bathroom. Medium amount bright red bloody stool noted.

## 2018-10-23 NOTE — ED Notes (Addendum)
Patient states went to dinner and then when got home felt like had diarrhea had was blood. Patient states had two episodes of bloody stool of bright red blood and once since being here at ED.  Patient states had coloscopy a few years ago and had a few polyps removed.  Patient states for the past month and half having night sweats to the point of soaking the bed. Patient is on Eliquis. No abdominal pain.

## 2018-10-23 NOTE — ED Notes (Signed)
Report to Kelley, RN  

## 2018-10-23 NOTE — Consult Note (Signed)
Crystal Miniumarren Ivadell Gaul, MD [facility]  [address] [place], [place] [address] Phone: [phone] Fax : [phone]  Consultation  Referring Provider:     Dr. Amado CoeGouru Primary Care Physician:  Glori LuisSonnenberg, Eric G, MD Primary Gastroenterologist:  Dr. Servando SnareWohl         Reason for Consultation:     Rectal bleeding  Date of Admission:  10/23/2018 Date of Consultation:  10/23/2018         HPI:   Crystal LollDiane Haas is a 73 y.o. female who had a colonoscopy by me in 2014 and has a history of diarrhea. The patient had random biopsies of the colon at that time without any source of the diarrhea seen.  The patient reports that she was out to dinner with her friends yesterday and then on her way home thought she had 2 run to the bathroom because she was having diarrhea.  The patient states that she finally got the bathroom and a large amount of blood came out of her rectum.  The patient had this happen twice at home and twice in the emergency room.  The patient now reports that her bleeding has stopped.  The patient was on Eliqis which was stopped right before coming in.  She states her last dose was on Thursday in the morning.  She takes the blood thinner for Atrial fibrillation.  The patient denies having any episodes of rectal bleeding like this before. On admission the patient's hemoglobin was 12.1 which was down from 13.9 the day before. This morning the hemoglobin went down to 11.9.  The patient denies any abdominal pain associated with the rectal bleeding.  Past Medical History:  Diagnosis Date  . Anemia    distant past  . Anxiety   . Arthritis    "everywhere" - big toes worst  . Chronic atrial fibrillation    a. on eliquis; b. CHADS2VASc at least 2 (age x 1, female)  . Depression   . GERD (gastroesophageal reflux disease)    RARE  . Heart murmur    mild - followed by PCP  . Knee pain   . Motion sickness    back seat of car  . OSA on CPAP    CPAP-4 PSI  . Seasonal allergies    takes allergy weekly     Past Surgical History:  Procedure Laterality Date  . BREAST REDUCTION SURGERY Bilateral 08/29/2016   Procedure: BILATERAL MAMMARY REDUCTION  (BREAST)WITH LIPOSUCTION;  Surgeon: Peggye Formlaire S Dillingham, DO;  Location: [facility];  Service: Plastics;  Laterality: Bilateral;  . CATARACT EXTRACTION W/PHACO Left 05/03/2015   Procedure: CATARACT EXTRACTION PHACO AND INTRAOCULAR LENS PLACEMENT (IOC);  Surgeon: Lockie Molahadwick Brasington, MD;  [facility];  Service: Ophthalmology;  Laterality: Left;  CPAP  . CATARACT EXTRACTION W/PHACO Right 10/09/2016   Procedure: CATARACT EXTRACTION PHACO AND INTRAOCULAR LENS PLACEMENT (IOC);  Surgeon: Lockie Molahadwick Brasington, MD;  [facility];  Service: Ophthalmology;  Laterality: Right;  sleep apnea  . CHONDROPLASTY Right 04/29/2016   Procedure: CHONDROPLASTY;  Surgeon: Donato HeinzJames P Hooten, MD;  Location: [facility];  Service: Orthopedics;  Laterality: Right;  . EYE SURGERY    . KNEE ARTHROPLASTY Right 03/25/2018   Procedure: COMPUTER ASSISTED TOTAL KNEE ARTHROPLASTY;  Surgeon: Donato HeinzHooten, James P, MD;  Location: [facility];  Service: Orthopedics;  Laterality: Right;  . KNEE ARTHROSCOPY WITH LATERAL MENISECTOMY  04/29/2016   Procedure: KNEE ARTHROSCOPY WITH LATERAL MENISECTOMY;  Surgeon: Donato HeinzJames P Hooten, MD;  [facility];  Service: Orthopedics;;  . KNEE ARTHROSCOPY WITH MEDIAL MENISECTOMY  04/29/2016   Procedure: KNEE ARTHROSCOPY WITH MEDIAL MENISECTOMY;  Surgeon: Donato Heinz, MD;  Location: [facility];  Service: Orthopedics;;  . REDUCTION MAMMAPLASTY Bilateral 09/2016  . RETINAL DETACHMENT SURGERY Left May 03, 2015   Dr. Inez Pilgrim, [facility]  . TUBAL LIGATION      Prior to Admission medications   Medication Sig Start Date End Date Taking? Authorizing Provider  apixaban (ELIQUIS) 5 MG TABS tablet Take 1 tablet (5 mg total) by mouth 2 (two) times daily. 12/08/17  Yes Iran Ouch, MD  atorvastatin (LIPITOR) 20 MG tablet TAKE 1  TABLET(20 MG) BY MOUTH DAILY Patient taking differently: Take 20 mg by mouth every morning.  11/25/17  Yes Glori Luis, MD  B Complex-C (B-COMPLEX WITH VITAMIN C) tablet Take 1 tablet by mouth daily.   Yes [provider]  cetirizine (ZYRTEC) 10 MG tablet Take 10 mg by mouth daily.   Yes [provider]  Cholecalciferol (VITAMIN D3) 2000 units TABS Take 2,000 Units by mouth daily.   Yes [provider]  CYANOCOBALAMIN PO Take 1 tablet by mouth daily.   Yes [provider]  EPIPEN 2-PAK 0.3 MG/0.3ML SOAJ injection Inject 0.3 mg as directed as directed. AS NEEDED FOR ANAPHYLAXIS  12/04/15  Yes [provider]  fluticasone (FLONASE) 50 MCG/ACT nasal spray Place 2 sprays into both nostrils daily.  03/07/16  Yes [provider]  losartan (COZAAR) 25 MG tablet Take 1 tablet (25 mg total) by mouth daily. 05/12/18 10/23/18 Yes Iran Ouch, MD  montelukast (SINGULAIR) 10 MG tablet Take 10 mg by mouth at bedtime.   Yes [provider]  Multiple Vitamin (MULTIVITAMIN WITH MINERALS) TABS tablet Take 1 tablet by mouth daily. One-A-Day Active 65+   Yes [provider]  NON FORMULARY 10 each by Other route daily. GIN SOAKED RAISINS FOR PAIN RELIEF   Yes [provider]  Olopatadine HCl (PATADAY) 0.2 % SOLN Place 1 drop into both eyes daily.   Yes [provider]  Papaya CHEW Chew 3-4 each by mouth as needed.   Yes [provider]  PARoxetine (PAXIL) 20 MG tablet Take 1 tablet (20 mg total) by mouth daily. Patient taking differently: Take 20 mg by mouth every morning.  11/25/17  Yes Glori Luis, MD  zolpidem (AMBIEN) 5 MG tablet TAKE 1 TABLET BY MOUTH AT BEDTIME AS NEEDED FOR SLEEP 09/04/18  Yes Glori Luis, MD  acetaminophen (TYLENOL) 500 MG tablet Take 1,000 mg by mouth every 6 (six) hours as needed (for pain.).    [provider]  diphenhydrAMINE (BENADRYL) 25 MG tablet Take 25 mg by  mouth every morning.     [provider]  Melatonin 10 MG TABS Take 30 mg by mouth at bedtime.    [provider]    Family History  Problem Relation Age of Onset  . Alcoholism Other   . Heart disease Other   . Breast cancer Neg Hx      Social History   Tobacco Use  . Smoking status: Former Smoker    Packs/day: 0.25    Years: 2.00    Pack years: 0.50    Types: Cigarettes    Last attempt to quit: 10/21/1969    Years since quitting: 49.0  . Smokeless tobacco: Never Used  Substance Use Topics  . Alcohol use: Yes    Alcohol/week: 7.0 standard drinks  Types: 7 Shots of liquor per week    Comment: SOCIALLY  . Drug use: No    Allergies as of 10/22/2018 - Review Complete 10/22/2018  Allergen Reaction Noted  . Apple Anaphylaxis 05/03/2015  . Daucus carota Anaphylaxis 05/03/2015  . Ivp dye [iodinated diagnostic agents] Shortness Of Breath 04/28/2015  . Other Swelling 04/28/2015  . Strawberry (diagnostic) Anaphylaxis 08/30/2015  . Strawberry extract Anaphylaxis 05/03/2015  . Iodine Swelling 05/03/2015  . Tape Other (See Comments) 04/28/2015    Review of Systems:    All systems reviewed and negative except where noted in HPI.   Physical Exam:  Vital signs in last 24 hours: Temp:  [97.7 F (36.5 C)-98.3 F (36.8 C)] 98.3 F (36.8 C) (01/03 1549) Pulse Rate:  [57-100] 78 (01/03 1549) Resp:  [11-30] 18 (01/03 1549) BP: (148-204)/(69-102) 171/81 (01/03 1549) SpO2:  [94 %-100 %] 100 % (01/03 1549) Weight:  [103.1 kg-104.3 kg] 103.1 kg (01/03 1549) Last BM Date: 10/23/18 General:   Pleasant, cooperative in NAD Head:  Normocephalic and atraumatic. Eyes:   No icterus.   Conjunctiva pink. PERRLA. Ears:  Normal auditory acuity. Neck:  Supple; no masses or thyroidomegaly Lungs: Respirations even and unlabored. Lungs clear to auscultation bilaterally.   No wheezes, crackles, or rhonchi.  Heart:  Regular rate and rhythm;  Without murmur, clicks, rubs or  gallops Abdomen:  Soft, nondistended, nontender. Normal bowel sounds. No appreciable masses or hepatomegaly.  No rebound or guarding.  Rectal:  Not performed. Msk:  Symmetrical without gross deformities.    Extremities:  Without edema, cyanosis or clubbing. Neurologic:  Alert and oriented x3;  grossly normal neurologically. Skin:  Intact without significant lesions or rashes. Cervical Nodes:  No significant cervical adenopathy. Psych:  Alert and cooperative. Normal affect.  LAB RESULTS: Recent Labs    10/22/18 2107 10/23/18 0814 10/23/18 1355  WBC 16.3*  --   --   HGB 13.9 12.1 11.9*  HCT 43.3  --  36.9  PLT 275  --   --    BMET Recent Labs    10/22/18 2107  NA 141  K 4.0  CL 105  CO2 27  GLUCOSE 133*  BUN 18  CREATININE 0.97  CALCIUM 9.1   LFT Recent Labs    10/22/18 2107  PROT 7.3  ALBUMIN 4.2  AST 20  ALT 25  ALKPHOS 69  BILITOT 1.0   PT/INR No results for input(s): LABPROT, INR in the last 72 hours.  STUDIES: No results found.    Impression / Plan:   Assessment: Active Problems:   BRBPR (bright red blood per rectum)   Crystal Haas is a 73 y.o. y/o female with hematochezia. The patient states that the bleeding has slowed down if not stopped completely.  She takes Eliquis for A. Fib which has been held.  The patient's last colonoscopy was in 2014.  She also reports that she always has loose bowel movements.  Plan:  The patient will be set up for a colonoscopy for tomorrow. The patient has been explained the plan and agrees with it. She will be prepped today. I have discussed risks & benefits which include, but are not limited to, bleeding, infection, perforation & drug reaction.  The patient agrees with this plan & written consent will be obtained.     Thank you for involving me in the care of this patient.      LOS: 0 days   Crystal Miniumarren Crystal Marlette, MD  10/23/2018, 6:41 PM  Note: This dictation was prepared with Dragon dictation along with smaller  phrase technology. Any transcriptional errors that result from this process are unintentional.

## 2018-10-23 NOTE — ED Notes (Addendum)
Pt sitting in bedside chair Provided water Pt informed of need for urine specimen-hat provided pt says she will attempt to void

## 2018-10-23 NOTE — ED Notes (Signed)
Pt had x1 bloody stool at this time.

## 2018-10-23 NOTE — ED Provider Notes (Signed)
[facility] Emergency Department Provider Note   ____________________________________________   First MD Initiated Contact with Patient 10/23/18 0036     (approximate)  I have reviewed the triage vital signs and the nursing notes.   HISTORY  Chief Complaint Rectal Bleeding    HPI Crystal Haas is a 73 y.o. female who presents to the ED from home with a chief complaint of rectal bleeding.  Patient takes Eliquis for atrial fibrillation.  Tonight reports rectal bleeding twice at home without stool.  Had another large output of bloody stool in triage and another in the treatment room.  Last colonoscopy several years ago which was normal.  Has not had similar symptoms previously.  Denies associated fever, chills, chest pain, shortness of breath, abdominal pain, nausea, vomiting, diarrhea, dizziness.  Patient lives alone and was concerned she would bleed to death at home.  Denies recent travel or trauma.   Past Medical History:  Diagnosis Date  . Anemia    distant past  . Anxiety   . Arthritis    "everywhere" - big toes worst  . Chronic atrial fibrillation    a. on eliquis; b. CHADS2VASc at least 2 (age x 1, female)  . Depression   . GERD (gastroesophageal reflux disease)    RARE  . Heart murmur    mild - followed by PCP  . Knee pain   . Motion sickness    back seat of car  . OSA on CPAP    CPAP-4 PSI  . Seasonal allergies    takes allergy weekly    Patient Active Problem List   Diagnosis Date Noted  . BRBPR (bright red blood per rectum) 10/23/2018  . Night sweats 04/20/2018  . 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 Hand tingling 10/11/2017  . Morbid obesity (HCC) 10/11/2017  . Degenerative arthritis of right knee 07/17/2017  . Status post bilateral breast reduction 09/06/2016  . Allergic rhinitis 09/02/2016  . Skin lesion 09/02/2016  . Symptomatic mammary hypertrophy 08/29/2016  . Change of skin color 08/06/2016  . Excessive crying  05/16/2016  . Right knee pain 02/15/2016  . Injury of right rotator cuff 02/15/2016  . Hyperkalemia 09/12/2015  . Chronic atrial fibrillation 09/11/2015  . Encounter for general adult medical examination with abnormal findings 09/04/2015  . Sleep apnea 08/30/2015  . Depression 06/21/2015  . Chronic insomnia 05/30/2015  . Blepharitis 07/13/2014  . Anxiety 05/27/2014  . Hypertension 05/27/2014  . Cataracts, bilateral 05/25/2014  . 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  . 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 Macular hole of left eye 05/24/2014  . 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 BREAST REDUCTION SURGERY Bilateral 08/29/2016   Procedure: BILATERAL MAMMARY REDUCTION  (BREAST)WITH LIPOSUCTION;  Surgeon: Peggye Formlaire S Dillingham, DO;  Location: [facility];  Service: Plastics;  Laterality: Bilateral;  . CATARACT EXTRACTION W/PHACO Left 05/03/2015   Procedure: CATARACT EXTRACTION PHACO AND INTRAOCULAR LENS PLACEMENT (IOC);  Surgeon: Lockie Molahadwick Brasington, MD;  [facility];  Service: Ophthalmology;  Laterality: Left;  CPAP  . CATARACT EXTRACTION W/PHACO Right 10/09/2016   Procedure: CATARACT EXTRACTION PHACO AND INTRAOCULAR LENS PLACEMENT (IOC);  Surgeon: Lockie Molahadwick Brasington, MD;  [facility];  Service: Ophthalmology;  Laterality: Right;  sleep apnea  . CHONDROPLASTY Right 04/29/2016   Procedure: CHONDROPLASTY;  Surgeon: Donato HeinzJames P Hooten, MD;  Location: [facility] ORS;  Service:  Orthopedics;  Laterality: Right;  . EYE SURGERY    . KNEE ARTHROPLASTY Right 03/25/2018   Procedure: COMPUTER ASSISTED TOTAL KNEE ARTHROPLASTY;  Surgeon: Donato Heinz, MD;  Location: [facility];  Service: Orthopedics;  Laterality: Right;  . KNEE ARTHROSCOPY WITH LATERAL MENISECTOMY  04/29/2016   Procedure: KNEE ARTHROSCOPY WITH LATERAL MENISECTOMY;  Surgeon: Donato Heinz, MD;   Location: [facility];  Service: Orthopedics;;  . KNEE ARTHROSCOPY WITH MEDIAL MENISECTOMY  04/29/2016   Procedure: KNEE ARTHROSCOPY WITH MEDIAL MENISECTOMY;  Surgeon: Donato Heinz, MD;  Location: [facility];  Service: Orthopedics;;  . REDUCTION MAMMAPLASTY Bilateral 09/2016  . RETINAL DETACHMENT SURGERY Left May 03, 2015   Dr. Inez Pilgrim, [facility]  . TUBAL LIGATION      Prior to Admission medications   Medication Sig Start Date End Date Taking? Authorizing Provider  acetaminophen (TYLENOL) 500 MG tablet Take 1,000 mg by mouth every 6 (six) hours as needed (for pain.).    [provider]  apixaban (ELIQUIS) 5 MG TABS tablet Take 1 tablet (5 mg total) by mouth 2 (two) times daily. 12/08/17   Iran Ouch, MD  atorvastatin (LIPITOR) 20 MG tablet TAKE 1 TABLET(20 MG) BY MOUTH DAILY Patient taking differently: Take 20 mg by mouth every morning.  11/25/17   Glori Luis, MD  B Complex-C (B-COMPLEX WITH VITAMIN C) tablet Take 1 tablet by mouth daily.    [provider]  Cholecalciferol (VITAMIN D3) 2000 units TABS Take 2,000 Units by mouth daily.    [provider]  diphenhydrAMINE (BENADRYL) 25 MG tablet Take 25 mg by mouth every morning.     [provider]  EPIPEN 2-PAK 0.3 MG/0.3ML SOAJ injection Inject 0.3 mg as directed as directed. AS NEEDED FOR ANAPHYLAXIS  12/04/15   [provider]  fluticasone (FLONASE) 50 MCG/ACT nasal spray Place 2 sprays into both nostrils daily.  03/07/16   [provider]  losartan (COZAAR) 25 MG tablet Take 1 tablet (25 mg total) by mouth daily. 05/12/18 08/10/18  Iran Ouch, MD  Melatonin 10 MG TABS Take 30 mg by mouth at bedtime.    [provider]  montelukast (SINGULAIR) 10 MG tablet Take 10 mg by mouth at bedtime.    [provider]  Multiple Vitamin (MULTIVITAMIN WITH MINERALS) TABS tablet Take 1 tablet by mouth daily. One-A-Day Active 65+    [provider]  NON FORMULARY  10 each by Other route daily. GIN SOAKED RAISINS FOR PAIN RELIEF    [provider]  Olopatadine HCl (PATADAY) 0.2 % SOLN Place 1 drop into both eyes daily.    [provider]  Papaya CHEW Chew 3-4 each by mouth as needed.    [provider]  PARoxetine (PAXIL) 20 MG tablet Take 1 tablet (20 mg total) by mouth daily. Patient taking differently: Take 20 mg by mouth every morning.  11/25/17   Glori Luis, MD  zolpidem (AMBIEN) 5 MG tablet TAKE 1 TABLET BY MOUTH AT BEDTIME AS NEEDED FOR SLEEP 09/04/18   Glori Luis, MD    Allergies Apple; Daucus carota; Ivp dye [iodinated diagnostic agents]; Other; Strawberry (diagnostic); Strawberry extract; Iodine; and Tape  Family History  Problem Relation Age of Onset  . Alcoholism Other   . Heart disease Other   . Breast cancer Neg Hx     Social History Social History   Tobacco Use  . Smoking status: Former Smoker    Packs/day: 0.25  Years: 2.00    Pack years: 0.50    Types: Cigarettes    Last attempt to quit: 10/21/1969    Years since quitting: 49.0  . Smokeless tobacco: Never Used  Substance Use Topics  . Alcohol use: Yes    Alcohol/week: 7.0 standard drinks    Types: 7 Shots of liquor per week    Comment: SOCIALLY  . Drug use: No    Review of Systems  Constitutional: No fever/chills Eyes: No visual changes. ENT: No sore throat. Cardiovascular: Denies chest pain. Respiratory: Denies shortness of breath. Gastrointestinal: Positive for rectal bleeding.  No abdominal pain.  No nausea, no vomiting.  No diarrhea.  No constipation. Genitourinary: Negative for dysuria. Musculoskeletal: Negative for back pain. Skin: Negative for rash. Neurological: Negative for headaches, focal weakness or numbness.   ____________________________________________   PHYSICAL EXAM:  VITAL SIGNS: ED Triage Vitals  Enc Vitals Group     BP 10/22/18 2106 (!) 204/84     Pulse Rate 10/22/18 2106 100     Resp  10/22/18 2106 (!) 22     Temp 10/22/18 2106 97.7 F (36.5 C)     Temp Source 10/22/18 2106 Oral     SpO2 10/22/18 2106 99 %     Weight 10/22/18 2102 230 lb (104.3 kg)     Height 10/22/18 2102 5\' 4"  (1.626 m)     Head Circumference --      Peak Flow --      Pain Score 10/22/18 2105 0     Pain Loc --      Pain Edu? --      Excl. in GC? --     Constitutional: Alert and oriented. Well appearing and in no acute distress. Eyes: Conjunctivae are normal. PERRL. EOMI. Head: Atraumatic. Nose: No congestion/rhinnorhea. Mouth/Throat: Mucous membranes are moist.  Oropharynx non-erythematous. Neck: No stridor.   Cardiovascular: Normal rate, irregular rhythm. Grossly normal heart sounds.  Good peripheral circulation. Respiratory: Normal respiratory effort.  No retractions. Lungs CTAB. Gastrointestinal: Soft and nontender to light or deep palpation. No distention. No abdominal bruits. No CVA tenderness. Musculoskeletal: No lower extremity tenderness nor edema.  No joint effusions. Neurologic:  Normal speech and language. No gross focal neurologic deficits are appreciated. No gait instability. Skin:  Skin is warm, dry and intact. No rash noted. Psychiatric: Mood and affect are normal. Speech and behavior are normal.  ____________________________________________   LABS (all labs ordered are listed, but only abnormal results are displayed)  Labs Reviewed  CBC WITH DIFFERENTIAL/PLATELET - Abnormal; Notable for the following components:      Result Value   WBC 16.3 (*)    Neutro Abs 10.8 (*)    Monocytes Absolute 1.9 (*)    Abs Immature Granulocytes 0.18 (*)    All other components within normal limits  COMPREHENSIVE METABOLIC PANEL - Abnormal; Notable for the following components:   Glucose, Bld 133 (*)    GFR calc non Af Amer 59 (*)    All other components within normal limits  LIPASE, BLOOD  URINALYSIS, COMPLETE (UACMP) WITH MICROSCOPIC  MAGNESIUM  PHOSPHORUS  TYPE AND SCREEN    ____________________________________________  EKG  ED ECG REPORT I, , J, the attending physician, personally viewed and interpreted this ECG.   Date: 10/23/2018  EKG Time: 0059  Rate: 89  Rhythm: atrial fibrillation, rate 89  Axis: Normal  Intervals:none  ST&T Change: Nonspecific  ____________________________________________  RADIOLOGY  ED MD interpretation:  None  Official radiology report(s): No results  found.  ____________________________________________   PROCEDURES  Procedure(s) performed:   Rectal exam: Gross hematochezia noted.  Procedures  Critical Care performed: Yes, see critical care note(s)   CRITICAL CARE Performed by: Irean Hong   Total critical care time: 30 minutes  Critical care time was exclusive of separately billable procedures and treating other patients.  Critical care was necessary to treat or prevent imminent or life-threatening deterioration.  Critical care was time spent personally by me on the following activities: development of treatment plan with patient and/or surrogate as well as nursing, discussions with consultants, evaluation of patient's response to treatment, examination of patient, obtaining history from patient or surrogate, ordering and performing treatments and interventions, ordering and review of laboratory studies, ordering and review of radiographic studies, pulse oximetry and re-evaluation of patient's condition.  ____________________________________________   INITIAL IMPRESSION / ASSESSMENT AND PLAN / ED COURSE  As part of my medical decision making, I reviewed the following data within the electronic MEDICAL RECORD NUMBER Nursing notes reviewed and incorporated, Labs reviewed, EKG interpreted, Old chart reviewed, Discussed with admitting physician  and Notes from prior ED visits   73 year old female on Eliquis who presents with rectal bleeding. Differential diagnosis includes, but is not limited to,  diverticulitis, colitis, AVM, etc.  H/H and BP currently stable.  Patient is typed and screened.  Will initiate IV fluid resuscitation.  Discussed with hospitalist to evaluate patient in the emergency department for admission.     ____________________________________________   FINAL CLINICAL IMPRESSION(S) / ED DIAGNOSES  Final diagnoses:  Rectal bleeding  Hematochezia  Lower GI bleed     ED Discharge Orders    None       Note:  This document was prepared using Dragon voice recognition software and may include unintentional dictation errors.    Irean Hong, MD 10/23/18 [phone]

## 2018-10-23 NOTE — ED Notes (Signed)
Assisted patient to restroom. Patient had bright red bleeding from rectum.

## 2018-10-23 NOTE — Progress Notes (Signed)
Patient was seen and examined in the emergency department.  Reports her GI bleed is slowing down.  Had only one bowel movement with some blood.  Hemodynamically stable.  Denies any abdominal pain.  H&P, labs reviewed.  Plan is to monitor hemoglobin hematocrit closely and will consider bleeding scan if patient starts bleeding profusely.  GI consult is pending.  Hold Eliquis

## 2018-10-24 ENCOUNTER — Encounter
Admission: EM | Disposition: A | Discharge status: Home / Self Care | Payer: Self-pay | Source: Home / Self Care | Attending: Emergency Medicine

## 2018-10-24 ENCOUNTER — Observation Stay: Payer: PPO | Admitting: Anesthesiology

## 2018-10-24 DIAGNOSIS — G4733 Obstructive sleep apnea (adult) (pediatric): Secondary | ICD-10-CM | POA: Diagnosis not present

## 2018-10-24 DIAGNOSIS — I10 Essential (primary) hypertension: Secondary | ICD-10-CM | POA: Diagnosis not present

## 2018-10-24 DIAGNOSIS — K922 Gastrointestinal hemorrhage, unspecified: Secondary | ICD-10-CM

## 2018-10-24 DIAGNOSIS — K5790 Diverticulosis of intestine, part unspecified, without perforation or abscess without bleeding: Secondary | ICD-10-CM | POA: Diagnosis not present

## 2018-10-24 DIAGNOSIS — K219 Gastro-esophageal reflux disease without esophagitis: Secondary | ICD-10-CM | POA: Diagnosis not present

## 2018-10-24 DIAGNOSIS — K5730 Diverticulosis of large intestine without perforation or abscess without bleeding: Secondary | ICD-10-CM | POA: Diagnosis not present

## 2018-10-24 DIAGNOSIS — I480 Paroxysmal atrial fibrillation: Secondary | ICD-10-CM | POA: Diagnosis not present

## 2018-10-24 DIAGNOSIS — K921 Melena: Secondary | ICD-10-CM | POA: Diagnosis not present

## 2018-10-24 HISTORY — PX: COLONOSCOPY WITH PROPOFOL: SHX5780

## 2018-10-24 LAB — CBC
HCT: 36 % (ref 36.0–46.0)
Hemoglobin: 11.5 g/dL — ABNORMAL LOW (ref 12.0–15.0)
MCH: 29.6 pg (ref 26.0–34.0)
MCHC: 31.9 g/dL (ref 30.0–36.0)
MCV: 92.5 fL (ref 80.0–100.0)
Platelets: 205 10*3/uL (ref 150–400)
RBC: 3.89 MIL/uL (ref 3.87–5.11)
RDW: 14 % (ref 11.5–15.5)
WBC: 12.9 10*3/uL — ABNORMAL HIGH (ref 4.0–10.5)
nRBC: 0 % (ref 0.0–0.2)

## 2018-10-24 SURGERY — COLONOSCOPY WITH PROPOFOL
Anesthesia: General

## 2018-10-24 MED ORDER — LIDOCAINE HCL (CARDIAC) PF 100 MG/5ML IV SOSY
PREFILLED_SYRINGE | INTRAVENOUS | Status: DC | PRN
  Administered 2018-10-24: 60 mg via INTRAVENOUS

## 2018-10-24 MED ORDER — LIDOCAINE HCL (PF) 2 % IJ SOLN
INTRAMUSCULAR | Status: AC
  Filled 2018-10-24: qty 10

## 2018-10-24 MED ORDER — SODIUM CHLORIDE 0.9 % IV SOLN
INTRAVENOUS | Status: DC
  Administered 2018-10-24: 1000 mL via INTRAVENOUS

## 2018-10-24 MED ORDER — PROPOFOL 10 MG/ML IV BOLUS
INTRAVENOUS | Status: DC | PRN
  Administered 2018-10-24: 20 mg via INTRAVENOUS
  Administered 2018-10-24: 50 mg via INTRAVENOUS
  Administered 2018-10-24: 10 mg via INTRAVENOUS
  Administered 2018-10-24: 20 mg via INTRAVENOUS

## 2018-10-24 MED ORDER — PROPOFOL 500 MG/50ML IV EMUL
INTRAVENOUS | Status: DC | PRN
  Administered 2018-10-24: 120 ug/kg/min via INTRAVENOUS

## 2018-10-24 MED ORDER — PROPOFOL 500 MG/50ML IV EMUL
INTRAVENOUS | Status: AC
  Filled 2018-10-24: qty 50

## 2018-10-24 NOTE — Progress Notes (Signed)
   Melodie Bouillon, MD [address], [place], [place], [address] [address], [address], [place], [place], [address] Phone: [phone]  Fax: [phone]   Subjective: Pt states she drank most of her prep.  Reports blood streaks in stool with the prep.  No nausea or vomiting.  Last dose of Eliquis was 48 hours ago.   Objective: Exam: Vital signs in last 24 hours: Vitals:   10/23/18 2039 10/24/18 0349 10/24/18 0349 10/24/18 0741  BP: (!) 148/58 (!) 145/81 (!) 145/81 (!) 149/90  Pulse: 86 89 89 95  Resp: 17 17 17 18   Temp: 98.1 F (36.7 C) 98.7 F (37.1 C) 98.7 F (37.1 C) 98.1 F (36.7 C)  TempSrc: Oral Oral Oral Tympanic  SpO2: 100% 97% 97% 99%  Weight:      Height:       Weight change: -1.225 kg No intake or output data in the 24 hours ending 10/24/18 0806  General: No acute distress, AAO x3 Abd: Soft, NT/ND, No HSM Skin: Warm, no rashes Neck: Supple, Trachea midline   Lab Results: Lab Results  Component Value Date   WBC 12.9 (H) 10/24/2018   HGB 11.5 (L) 10/24/2018   HCT 36.0 10/24/2018   MCV 92.5 10/24/2018   PLT 205 10/24/2018   Micro Results: No results found for this or any previous visit (from the past 240 hour(s)). Studies/Results: No results found. Medications:  Scheduled Meds: . [MAR Hold] atorvastatin  20 mg Oral BH-q7a  . [MAR Hold] losartan  25 mg Oral Daily  . [MAR Hold] montelukast  10 mg Oral QHS  . [MAR Hold] PARoxetine  20 mg Oral BH-q7a  . [MAR Hold] sodium chloride flush  10 mL Intravenous Q12H  . [MAR Hold] zolpidem  5 mg Oral QHS   Continuous Infusions: . sodium chloride 1,000 mL (10/24/18 0750)   PRN Meds:.[MAR Hold] acetaminophen **OR** [MAR Hold] acetaminophen, [MAR Hold] bisacodyl, [MAR Hold] ondansetron **OR** [MAR Hold] ondansetron (ZOFRAN) IV, [MAR Hold] senna-docusate   Assessment: Active Problems:   BRBPR (bright red blood per rectum)   Hematochezia    Plan: We will proceed with colonoscopy as  planned to evaluate etiology of hematochezia. Possible etiologies include diverticulosis, hemorrhoids, AVMs, ischemia Continue n.p.o. Please follow-up with colonoscopy procedure findings and recommendations postprocedure.  Continue serial CBCs and transfuse PRN    LOS: 0 days   Melodie Bouillon, MD 10/24/2018, 8:06 AM

## 2018-10-24 NOTE — Transfer of Care (Signed)
Immediate Anesthesia Transfer of Care Note  Patient: Crystal Haas  Procedure(s) Performed: COLONOSCOPY WITH PROPOFOL (N/A )  Patient Location: PACU  Anesthesia Type:General  Level of Consciousness: sedated  Airway & Oxygen Therapy: Patient Spontanous Breathing and Patient connected to nasal cannula oxygen  Post-op Assessment: Report given to RN and Post -op Vital signs reviewed and stable  Post vital signs: Reviewed and stable  Last Vitals:  Vitals Value Taken Time  BP 126/56 10/24/2018  0844  Temp 97.87F   Pulse 98   Resp 16   SpO2 98%     Last Pain:  Vitals:   10/24/18 0741  TempSrc: Tympanic  PainSc:          Complications: No apparent anesthesia complications

## 2018-10-24 NOTE — Op Note (Signed)
[facility] Gastroenterology Patient Name: Skilynn Moist Procedure Date: 10/24/2018 7:37 AM MRN: [id number] Account #: [id number] Date of Birth: 11/18/1945 Admit Type: Inpatient Age: 73 Room: [facility] ENDO ROOM 1 Gender: Female Note Status: Finalized Procedure:            Colonoscopy Providers:            Meli Faley B. Maximino Greenland MD, MD Referring MD:         Sallye Lat Md, MD (Referring MD) Medicines:            Monitored Anesthesia Care Complications:        No immediate complications. Procedure:            Pre-Anesthesia Assessment:                       - Prior to the procedure, a History and Physical was                        performed, and patient medications, allergies and                        sensitivities were reviewed. The patient's tolerance of                        previous anesthesia was reviewed.                       - The risks and benefits of the procedure and the                        sedation options and risks were discussed with the                        patient. All questions were answered and informed                        consent was obtained.                       - Patient identification and proposed procedure were                        verified prior to the procedure by the physician, the                        nurse, the anesthetist and the technician. The                        procedure was verified in the pre-procedure area in the                        procedure room in the endoscopy suite.                       - ASA Grade Assessment: II - A patient with mild                        systemic disease.                       - After reviewing the risks and benefits, the  patient                        was deemed in satisfactory condition to undergo the                        procedure.                       After obtaining informed consent, the colonoscope was                        passed under direct vision. Throughout the procedure,                    the patient's blood pressure, pulse, and oxygen                        saturations were monitored continuously. The                        Colonoscope was introduced through the anus and                        advanced to the the cecum, identified by appendiceal                        orifice and ileocecal valve. The colonoscopy was                        performed with ease. The patient tolerated the                        procedure well. The quality of the bowel preparation                        was fair. Findings:      The perianal and digital rectal examinations were normal.      Multiple diverticula were found in the sigmoid colon. No active bleeding       seen.      Clotted blood was found in the sigmoid colon. Small old blood clots, and       solid yellow to brown stool with old marroon blood on it was seen. This       suggests divertictular bleed that has resolved.      Solid stool was found in the sigmoid colon.      The exam was otherwise without abnormality.      Non-bleeding internal hemorrhoids were found during retroflexion. Impression:           - Preparation of the colon was fair.                       - Patient's Hemoglobin is stable around 11, there was                        no active bleeding seen in the colon, old blood clots                        noted with multiple diverticuli in the sigmoid colon,  and all the above consistent with hematochezia being                        from resolved diverticular bleed.                       - Diverticulosis in the sigmoid colon.                       - Blood in the sigmoid colon.                       - Stool in the sigmoid colon.                       - The examination was otherwise normal.                       - Non-bleeding internal hemorrhoids.                       - No specimens collected. Recommendation:       - Resume previous diet.                       - Continue present  medications.                       - Return to primary care physician as previously                        scheduled.                       - The findings and recommendations were discussed with                        the patient.                       - High fiber diet.                       - Due to the poor prep, the examination was limited and                        underlying small or flat lesions or polyps could have                        been present underneath the stool.                       - Pt should follow up in GI clinic in 4 weeks to                        discuss when her screening colonoscopy should be.                       - Continue Serial CBCs and transfuse PRN Procedure Code(s):    --- Professional ---                       [phone], Colonoscopy, flexible; diagnostic, including  collection of specimen(s) by brushing or washing, when                        performed (separate procedure) Diagnosis Code(s):    --- Professional ---                       K92.2, Gastrointestinal hemorrhage, unspecified                       K57.30, Diverticulosis of large intestine without                        perforation or abscess without bleeding CPT copyright 2018 American Medical Association. All rights reserved. The codes documented in this report are preliminary and upon coder review may  be revised to meet current compliance requirements.  Melodie BouillonVarnita Leonela Kivi, MD Michel BickersVarnita B. Maximino Greenlandahiliani MD, MD 10/24/2018 8:56:11 AM This report has been signed electronically. Number of Addenda: 0 Note Initiated On: 10/24/2018 7:37 AM Scope Withdrawal Time: 0 hours 15 minutes 17 seconds  Total Procedure Duration: 0 hours 29 minutes 43 seconds       [facility]

## 2018-10-24 NOTE — Progress Notes (Signed)
Pt to be discharged today following her endo/colonoscopy. Iv and tele removed. disch instructions (no prescrips) given. Pt showered. Gait steady. Pt dich via w.c[facility] transport provided by a friend.

## 2018-10-24 NOTE — Discharge Summary (Signed)
[facility]   PATIENT NAME: Crystal Haas    MR#:  [id number]  DATE OF BIRTH:  [birth year]  DATE OF ADMISSION:  10/23/2018 ADMITTING PHYSICIAN: Barbaraann Rondo, MD  DATE OF DISCHARGE: 10/24/2018  PRIMARY CARE PHYSICIAN: Glori Luis, MD    ADMISSION DIAGNOSIS:  Hematochezia [K92.1] Rectal bleeding [K62.5] Lower GI bleed [K92.2]  DISCHARGE DIAGNOSIS:  Active Problems:   Lower GI bleed   Hematochezia   Diverticulosis of large intestine without diverticulitis   SECONDARY DIAGNOSIS:   Past Medical History:  Diagnosis Date  . Anemia    distant past  . Anxiety   . Arthritis    "everywhere" - big toes worst  . Chronic atrial fibrillation    a. on eliquis; b. CHADS2VASc at least 2 (age x 1, female)  . Depression   . GERD (gastroesophageal reflux disease)    RARE  . Heart murmur    mild - followed by PCP  . Knee pain   . Motion sickness    back seat of car  . OSA on CPAP    CPAP-4 PSI  . Seasonal allergies    takes allergy weekly    HOSPITAL COURSE:  73 year old female with PAF on anticoagulation who presented to the emergency room due to rectal bleeding.  1.  Rectal bleeding/hematochezia: Patient was eval by GI.  She underwent colonoscopy.  She has evidence of diverticular bleed.  Recommendations are for high-fiber diet.  She will follow-up with GI in 2 weeks.    2.  PAF: We will continue Eliquis  .  Essential hypertension: Continue losartan  4.  Depression: Continue Paxil  DISCHARGE CONDITIONS AND DIET:   Table for discharge on regular diet  CONSULTS OBTAINED:  Treatment Team:  Barbaraann Rondo, MD Midge Minium, MD  DRUG ALLERGIES:   Allergies  Allergen Reactions  . Apple Anaphylaxis    Throat swells but subsides with po benadry  . Daucus Carota Anaphylaxis    Peeling of carrot only but subsides with po benadry  . Ivp Dye [Iodinated Diagnostic Agents] Shortness Of Breath    Also swelling.  Topical  betadine is OK.  . Other Swelling    Raw fruits cause throat to swell. Topical contact with carrots cause hands to swell, ok to eat. Pt can eat citrus and blueberries with no problems  . Strawberry (Diagnostic) Anaphylaxis    Throat swells but subsides with benadryl  . Strawberry Extract Anaphylaxis    Throat swells but subsides with benadryl  . Iodine Swelling    IV   . Tape Other (See Comments)    Most tapes case raw skin.  Paper tape is OK.    DISCHARGE MEDICATIONS:   Allergies as of 10/24/2018      Reactions   Apple Anaphylaxis   Throat swells but subsides with po benadry   Daucus Carota Anaphylaxis   Peeling of carrot only but subsides with po benadry   Ivp Dye [iodinated Diagnostic Agents] Shortness Of Breath   Also swelling.  Topical betadine is OK.   Other Swelling   Raw fruits cause throat to swell. Topical contact with carrots cause hands to swell, ok to eat. Pt can eat citrus and blueberries with no problems   Strawberry (diagnostic) Anaphylaxis   Throat swells but subsides with benadryl   Strawberry Extract Anaphylaxis   Throat swells but subsides with benadryl   Iodine Swelling   IV    Tape Other (See Comments)  Most tapes case raw skin.  Paper tape is OK.      Medication List    TAKE these medications   acetaminophen 500 MG tablet Commonly known as:  TYLENOL Take 1,000 mg by mouth every 6 (six) hours as needed (for pain.).   apixaban 5 MG Tabs tablet Commonly known as:  ELIQUIS Take 1 tablet (5 mg total) by mouth 2 (two) times daily.   atorvastatin 20 MG tablet Commonly known as:  LIPITOR TAKE 1 TABLET(20 MG) BY MOUTH DAILY What changed:    how much to take  how to take this  when to take this  additional instructions   B-complex with vitamin C tablet Take 1 tablet by mouth daily.   cetirizine 10 MG tablet Commonly known as:  ZYRTEC Take 10 mg by mouth daily.   CYANOCOBALAMIN PO Take 1 tablet by mouth daily.   diphenhydrAMINE 25 MG  tablet Commonly known as:  BENADRYL Take 25 mg by mouth every morning.   EPIPEN 2-PAK 0.3 mg/0.3 mL Soaj injection Generic drug:  EPINEPHrine Inject 0.3 mg as directed as directed. AS NEEDED FOR ANAPHYLAXIS   fluticasone 50 MCG/ACT nasal spray Commonly known as:  FLONASE Place 2 sprays into both nostrils daily.   losartan 25 MG tablet Commonly known as:  COZAAR Take 1 tablet (25 mg total) by mouth daily.   Melatonin 10 MG Tabs Take 30 mg by mouth at bedtime.   montelukast 10 MG tablet Commonly known as:  SINGULAIR Take 10 mg by mouth at bedtime.   multivitamin with minerals Tabs tablet Take 1 tablet by mouth daily. One-A-Day Active 65+   NON FORMULARY 10 each by Other route daily. GIN SOAKED RAISINS FOR PAIN RELIEF   Papaya Chew Chew 3-4 each by mouth as needed.   PARoxetine 20 MG tablet Commonly known as:  PAXIL Take 1 tablet (20 mg total) by mouth daily. What changed:  when to take this   PATADAY 0.2 % Soln Generic drug:  Olopatadine HCl Place 1 drop into both eyes daily.   Vitamin D3 50 MCG (2000 UT) Tabs Take 2,000 Units by mouth daily.   zolpidem 5 MG tablet Commonly known as:  AMBIEN TAKE 1 TABLET BY MOUTH AT BEDTIME AS NEEDED FOR SLEEP         Today   CHIEF COMPLAINT:  No more hematochezia.  Patient just came back from colonoscopy   VITAL SIGNS:  Blood pressure 135/69, pulse 74, temperature (!) 97.1 F (36.2 C), temperature source Tympanic, resp. rate 14, height 5\' 4"  (1.626 m), weight 103.1 kg, SpO2 100 %.   REVIEW OF SYSTEMS:  Review of Systems  Constitutional: Negative.  Negative for chills, fever and malaise/fatigue.  HENT: Negative.  Negative for ear discharge, ear pain, hearing loss, nosebleeds and sore throat.   Eyes: Negative.  Negative for blurred vision and pain.  Respiratory: Negative.  Negative for cough, hemoptysis, shortness of breath and wheezing.   Cardiovascular: Negative.  Negative for chest pain, palpitations and leg  swelling.  Gastrointestinal: Negative.  Negative for abdominal pain, blood in stool, diarrhea, nausea and vomiting.  Genitourinary: Negative.  Negative for dysuria.  Musculoskeletal: Negative.  Negative for back pain.  Skin: Negative.   Neurological: Negative for dizziness, tremors, speech change, focal weakness, seizures and headaches.  Endo/Heme/Allergies: Negative.  Does not bruise/bleed easily.  Psychiatric/Behavioral: Negative.  Negative for depression, hallucinations and suicidal ideas.     PHYSICAL EXAMINATION:  GENERAL:  73 y.o.-year-old patient lying in the  bed with no acute distress.  NECK:  Supple, no jugular venous distention. No thyroid enlargement, no tenderness.  LUNGS: Normal breath sounds bilaterally, no wheezing, rales,rhonchi  No use of accessory muscles of respiration.  CARDIOVASCULAR: S1, S2 normal. No murmurs, rubs, or gallops.  ABDOMEN: Soft, non-tender, non-distended. Bowel sounds present. No organomegaly or mass.  EXTREMITIES: No pedal edema, cyanosis, or clubbing.  PSYCHIATRIC: The patient is alert and oriented x 3.  SKIN: No obvious rash, lesion, or ulcer.   DATA REVIEW:   CBC Recent Labs  Lab 10/24/18 0306  WBC 12.9*  HGB 11.5*  HCT 36.0  PLT 205    Chemistries  Recent Labs  Lab 10/22/18 2107  NA 141  K 4.0  CL 105  CO2 27  GLUCOSE 133*  BUN 18  CREATININE 0.97  CALCIUM 9.1  MG 2.4  AST 20  ALT 25  ALKPHOS 69  BILITOT 1.0    Cardiac Enzymes No results for input(s): TROPONINI in the last 168 hours.  Microbiology Results  @MICRORSLT48 @  RADIOLOGY:  No results found.    Allergies as of 10/24/2018      Reactions   Apple Anaphylaxis   Throat swells but subsides with po benadry   Daucus Carota Anaphylaxis   Peeling of carrot only but subsides with po benadry   Ivp Dye [iodinated Diagnostic Agents] Shortness Of Breath   Also swelling.  Topical betadine is OK.   Other Swelling   Raw fruits cause throat to swell. Topical contact  with carrots cause hands to swell, ok to eat. Pt can eat citrus and blueberries with no problems   Strawberry (diagnostic) Anaphylaxis   Throat swells but subsides with benadryl   Strawberry Extract Anaphylaxis   Throat swells but subsides with benadryl   Iodine Swelling   IV    Tape Other (See Comments)   Most tapes case raw skin.  Paper tape is OK.      Medication List    TAKE these medications   acetaminophen 500 MG tablet Commonly known as:  TYLENOL Take 1,000 mg by mouth every 6 (six) hours as needed (for pain.).   apixaban 5 MG Tabs tablet Commonly known as:  ELIQUIS Take 1 tablet (5 mg total) by mouth 2 (two) times daily.   atorvastatin 20 MG tablet Commonly known as:  LIPITOR TAKE 1 TABLET(20 MG) BY MOUTH DAILY What changed:    how much to take  how to take this  when to take this  additional instructions   B-complex with vitamin C tablet Take 1 tablet by mouth daily.   cetirizine 10 MG tablet Commonly known as:  ZYRTEC Take 10 mg by mouth daily.   CYANOCOBALAMIN PO Take 1 tablet by mouth daily.   diphenhydrAMINE 25 MG tablet Commonly known as:  BENADRYL Take 25 mg by mouth every morning.   EPIPEN 2-PAK 0.3 mg/0.3 mL Soaj injection Generic drug:  EPINEPHrine Inject 0.3 mg as directed as directed. AS NEEDED FOR ANAPHYLAXIS   fluticasone 50 MCG/ACT nasal spray Commonly known as:  FLONASE Place 2 sprays into both nostrils daily.   losartan 25 MG tablet Commonly known as:  COZAAR Take 1 tablet (25 mg total) by mouth daily.   Melatonin 10 MG Tabs Take 30 mg by mouth at bedtime.   montelukast 10 MG tablet Commonly known as:  SINGULAIR Take 10 mg by mouth at bedtime.   multivitamin with minerals Tabs tablet Take 1 tablet by mouth daily. One-A-Day Active 65+  NON FORMULARY 10 each by Other route daily. GIN SOAKED RAISINS FOR PAIN RELIEF   Papaya Chew Chew 3-4 each by mouth as needed.   PARoxetine 20 MG tablet Commonly known as:   PAXIL Take 1 tablet (20 mg total) by mouth daily. What changed:  when to take this   PATADAY 0.2 % Soln Generic drug:  Olopatadine HCl Place 1 drop into both eyes daily.   Vitamin D3 50 MCG (2000 UT) Tabs Take 2,000 Units by mouth daily.   zolpidem 5 MG tablet Commonly known as:  AMBIEN TAKE 1 TABLET BY MOUTH AT BEDTIME AS NEEDED FOR SLEEP         Management plans discussed with the patient and she is in agreement. Stable for discharge home  Patient should follow up with pcp  CODE STATUS:     Code Status Orders  (From admission, onward)         Start     Ordered   10/23/18 0359  Full code  Continuous     10/23/18 0358        Code Status History    Date Active Date Inactive Code Status Order ID Comments User Context   03/25/2018 1310 03/27/2018 2032 Full Code [id number]  Donato HeinzHooten, James P, MD Inpatient   08/29/2016 1322 08/30/2016 1209 Full Code [id number]  Peggye Formillingham, Claire S, DO Inpatient      TOTAL TIME TAKING CARE OF THIS PATIENT: 38 minutes.    Note: This dictation was prepared with Dragon dictation along with smaller phrase technology. Any transcriptional errors that result from this process are unintentional.  Reda Gettis M.D on 10/24/2018 at 1:16 PM  Between 7am to 6pm - Pager - [phone] After 6pm go to [email or web address] - Social research officer, [email or web address] [email or web address]  [facility]  Office  [phone]  CC: Primary care physician; Glori LuisSonnenberg, Eric G, MD

## 2018-10-24 NOTE — Anesthesia Preprocedure Evaluation (Addendum)
Anesthesia Evaluation  Patient identified by MRN, date of birth, ID band Patient awake    Reviewed: Allergy & Precautions, H&P , NPO status , Patient's Chart, lab work & pertinent test results  Airway Mallampati: III  TM Distance: >3 FB     Dental  (+) Chipped, Poor Dentition   Pulmonary sleep apnea , former smoker,           Cardiovascular hypertension, + dysrhythmias Atrial Fibrillation + Valvular Problems/Murmurs (murmur)   Echo 2016: Left ventricle: The cavity size was normal. Wall thickness was   normal. Systolic function was normal. The estimated ejection   fraction was in the range of 55% to 60%. Wall motion was normal;   there were no regional wall motion abnormalities. - Left atrium: The atrium was mildly dilated. - Right atrium: The atrium was mildly dilated. Normal valves   Neuro/Psych PSYCHIATRIC DISORDERS Anxiety Depression negative neurological ROS     GI/Hepatic Neg liver ROS, GERD  Controlled,  Endo/Other  Morbid obesity  Renal/GU negative Renal ROS  negative genitourinary   Musculoskeletal  (+) Arthritis ,   Abdominal   Peds  Hematology  (+) Blood dyscrasia, anemia ,   Anesthesia Other Findings Past Medical History: No date: Anemia     Comment:  distant past No date: Anxiety No date: Arthritis     Comment:  "everywhere" - big toes worst No date: Chronic atrial fibrillation     Comment:  a. on eliquis; b. CHADS2VASc at least 2 (age x 1,               female) No date: Depression No date: GERD (gastroesophageal reflux disease)     Comment:  RARE No date: Heart murmur     Comment:  mild - followed by PCP No date: Knee pain No date: Motion sickness     Comment:  back seat of car No date: OSA on CPAP     Comment:  CPAP-4 PSI No date: Seasonal allergies     Comment:  takes allergy weekly  Past Surgical History: 08/29/2016: BREAST REDUCTION SURGERY; Bilateral     Comment:  Procedure:  BILATERAL MAMMARY REDUCTION  (BREAST)WITH               LIPOSUCTION;  Surgeon: Peggye Formlaire S Dillingham, DO;                Location: [facility];  Service: Plastics;               Laterality: Bilateral; 05/03/2015: CATARACT EXTRACTION W/PHACO; Left     Comment:  Procedure: CATARACT EXTRACTION PHACO AND INTRAOCULAR               LENS PLACEMENT (IOC);  Surgeon: Lockie Molahadwick Brasington, MD;               [facility];  Service: Ophthalmology;                Laterality: Left;  CPAP 10/09/2016: CATARACT EXTRACTION W/PHACO; Right     Comment:  Procedure: CATARACT EXTRACTION PHACO AND INTRAOCULAR               LENS PLACEMENT (IOC);  Surgeon: Lockie Molahadwick Brasington, MD;               [facility];  Service: Ophthalmology;                Laterality: Right;  sleep apnea 04/29/2016: CHONDROPLASTY; Right     Comment:  Procedure: CHONDROPLASTY;  Surgeon: Donato Heinz, MD;               Location: [facility];  Service: Orthopedics;  Laterality:               Right; No date: EYE SURGERY 03/25/2018: KNEE ARTHROPLASTY; Right     Comment:  Procedure: COMPUTER ASSISTED TOTAL KNEE ARTHROPLASTY;                Surgeon: Donato Heinz, MD;  Location: [facility];                Service: Orthopedics;  Laterality: Right; 04/29/2016: KNEE ARTHROSCOPY WITH LATERAL MENISECTOMY     Comment:  Procedure: KNEE ARTHROSCOPY WITH LATERAL MENISECTOMY;                Surgeon: Donato Heinz, MD;  Location: [facility];                Service: Orthopedics;; 04/29/2016: KNEE ARTHROSCOPY WITH MEDIAL MENISECTOMY     Comment:  Procedure: KNEE ARTHROSCOPY WITH MEDIAL MENISECTOMY;                Surgeon: Donato Heinz, MD;  Location: [facility];                Service: Orthopedics;; 09/2016: REDUCTION MAMMAPLASTY; Bilateral May 03, 2015: RETINAL DETACHMENT SURGERY; Left     Comment:  Dr. Inez Pilgrim, [facility] No date: TUBAL LIGATION  BMI    Body Mass Index:  39.02 kg/m       Reproductive/Obstetrics negative OB ROS                           Anesthesia Physical Anesthesia Plan  ASA: III  Anesthesia Plan: General   Post-op Pain Management:    Induction:   PONV Risk Score and Plan: Propofol infusion and TIVA  Airway Management Planned: Natural Airway and Nasal Cannula  Additional Equipment:   Intra-op Plan:   Post-operative Plan:   Informed Consent: I have reviewed the patients History and Physical, chart, labs and discussed the procedure including the risks, benefits and alternatives for the proposed anesthesia with the patient or authorized representative who has indicated his/her understanding and acceptance.   Dental Advisory Given  Plan Discussed with: Anesthesiologist, CRNA and Surgeon  Anesthesia Plan Comments:         Anesthesia Quick Evaluation

## 2018-10-24 NOTE — Anesthesia Post-op Follow-up Note (Signed)
Anesthesia QCDR form completed.        

## 2018-10-24 NOTE — Care Management Obs Status (Signed)
MEDICARE OBSERVATION STATUS NOTIFICATION   Patient Details  Name: Crystal Haas MRN: [id number] Date of Birth: 1946-03-31   Medicare Observation Status Notification Given:  Yes    Dalessandro Baldyga A Giara Mcgaughey, RN 10/24/2018, 10:53 AM

## 2018-10-26 ENCOUNTER — Encounter: Payer: Self-pay | Admitting: Gastroenterology

## 2018-10-26 ENCOUNTER — Telehealth: Payer: Self-pay

## 2018-10-26 NOTE — Telephone Encounter (Signed)
There are no appointments 30 min that I can place patient.

## 2018-10-26 NOTE — Telephone Encounter (Signed)
Copied from CRM [id number]. Topic: Appointment Scheduling - Scheduling Inquiry for Clinic >> Oct 26, 2018 10:10 AM Gaynelle Adu wrote: Reason for CRM: pt is schedule for 11-09-2018 with [facility]  for a Hosp follow up.  She requesting to have a sooner date stated she was advise to be seen within a week.

## 2018-10-26 NOTE — Telephone Encounter (Signed)
Transition Care Management Follow-up Telephone Call  How have you been since you were released from the hospital? Patient feels really weak , sleeping a lot, and she does not feel comfortable with the DX she was given.  Patient says she had tremendous amount of bleeding is why she drove her self to the hospital.    Do you understand why you were in the hospital? yes   Do you understand the discharge instrcutions? yes  Items Reviewed:  Medications reviewed: yes  Allergies reviewed: yes  Dietary changes reviewed: yes  Referrals reviewed: yes   Functional Questionnaire:   Activities of Daily Living (ADLs):   She states they are independent in the following: ambulation, bathing and hygiene, feeding, continence, grooming, toileting and dressing States they require assistance with the following: No assistance required at this time.   Any transportation issues/concerns?: no   Any patient concerns? Yes, Patient major concern is she does not agreewith the DX she was given.   Confirmed importance and date/time of follow-up visits scheduled: yes   Confirmed with patient if condition begins to worsen call PCP or go to the ER.  Patient was given the Call-a-Nurse line [phone]: yes

## 2018-10-26 NOTE — Anesthesia Postprocedure Evaluation (Signed)
Anesthesia Post Note  Patient: Jesselin Grosjean  Procedure(s) Performed: COLONOSCOPY WITH PROPOFOL (N/A )  Patient location during evaluation: PACU Anesthesia Type: General Level of consciousness: awake and alert Pain management: pain level controlled Vital Signs Assessment: post-procedure vital signs reviewed and stable Respiratory status: spontaneous breathing, nonlabored ventilation, respiratory function stable and patient connected to nasal cannula oxygen Cardiovascular status: blood pressure returned to baseline and stable Postop Assessment: no apparent nausea or vomiting Anesthetic complications: no     Last Vitals:  Vitals:   10/24/18 0859 10/24/18 0909  BP: 137/67 135/69  Pulse: 82 74  Resp:    Temp:    SpO2: 97% 100%    Last Pain:  Vitals:   10/24/18 0909  TempSrc:   PainSc: 0-No pain                 Jovita Gamma

## 2018-10-26 NOTE — Telephone Encounter (Signed)
Sent to PCP to advise 

## 2018-10-26 NOTE — Telephone Encounter (Signed)
10:30 on January 14 if it is still available.

## 2018-10-27 DIAGNOSIS — J301 Allergic rhinitis due to pollen: Secondary | ICD-10-CM | POA: Diagnosis not present

## 2018-10-28 DIAGNOSIS — H40003 Preglaucoma, unspecified, bilateral: Secondary | ICD-10-CM | POA: Diagnosis not present

## 2018-11-03 ENCOUNTER — Ambulatory Visit
Admission: RE | Admit: 2018-11-03 | Discharge: 2018-11-03 | Disposition: A | Discharge status: Home / Self Care | Payer: PPO | Attending: Family Medicine | Admitting: Family Medicine

## 2018-11-03 ENCOUNTER — Ambulatory Visit
Admission: RE | Admit: 2018-11-03 | Discharge: 2018-11-03 | Disposition: A | Discharge status: Home / Self Care | Payer: PPO | Source: Ambulatory Visit | Attending: Family Medicine | Admitting: Family Medicine

## 2018-11-03 ENCOUNTER — Other Ambulatory Visit
Admission: RE | Admit: 2018-11-03 | Discharge: 2018-11-03 | Disposition: A | Discharge status: Home / Self Care | Payer: PPO | Source: Home / Self Care | Attending: Family Medicine | Admitting: Family Medicine

## 2018-11-03 ENCOUNTER — Ambulatory Visit (INDEPENDENT_AMBULATORY_CARE_PROVIDER_SITE_OTHER): Payer: PPO | Admitting: Family Medicine

## 2018-11-03 ENCOUNTER — Encounter: Payer: Self-pay | Admitting: Family Medicine

## 2018-11-03 VITALS — BP 124/78 | HR 76 | Temp 98.0°F | Wt 223.2 lb

## 2018-11-03 DIAGNOSIS — G44209 Tension-type headache, unspecified, not intractable: Secondary | ICD-10-CM | POA: Diagnosis not present

## 2018-11-03 DIAGNOSIS — K922 Gastrointestinal hemorrhage, unspecified: Secondary | ICD-10-CM | POA: Diagnosis not present

## 2018-11-03 DIAGNOSIS — R61 Generalized hyperhidrosis: Secondary | ICD-10-CM | POA: Insufficient documentation

## 2018-11-03 DIAGNOSIS — R51 Headache: Secondary | ICD-10-CM

## 2018-11-03 DIAGNOSIS — R7309 Other abnormal glucose: Secondary | ICD-10-CM | POA: Insufficient documentation

## 2018-11-03 DIAGNOSIS — G4730 Sleep apnea, unspecified: Secondary | ICD-10-CM

## 2018-11-03 DIAGNOSIS — R0781 Pleurodynia: Secondary | ICD-10-CM

## 2018-11-03 DIAGNOSIS — Z23 Encounter for immunization: Secondary | ICD-10-CM | POA: Diagnosis not present

## 2018-11-03 DIAGNOSIS — R079 Chest pain, unspecified: Secondary | ICD-10-CM | POA: Diagnosis not present

## 2018-11-03 DIAGNOSIS — R519 Headache, unspecified: Secondary | ICD-10-CM | POA: Insufficient documentation

## 2018-11-03 LAB — BASIC METABOLIC PANEL
ANION GAP: 10 (ref 5–15)
BUN: 15 mg/dL (ref 8–23)
CO2: 26 mmol/L (ref 22–32)
Calcium: 9.5 mg/dL (ref 8.9–10.3)
Chloride: 104 mmol/L (ref 98–111)
Creatinine, Ser: 0.94 mg/dL (ref 0.44–1.00)
GFR calc Af Amer: >60 mL/min (ref >60)
GFR calc non Af Amer: >60 mL/min (ref >60)
Glucose, Bld: 99 mg/dL (ref 70–99)
POTASSIUM: 4 mmol/L (ref 3.5–5.1)
Sodium: 140 mmol/L (ref 135–145)

## 2018-11-03 LAB — CBC WITH DIFFERENTIAL/PLATELET
Abs Immature Granulocytes: 0.04 10*3/uL (ref 0.00–0.07)
Basophils Absolute: 0.1 10*3/uL (ref 0.0–0.1)
Basophils Relative: 1 %
Eosinophils Absolute: 0.5 10*3/uL (ref 0.0–0.5)
Eosinophils Relative: 7 %
HCT: 41.6 % (ref 36.0–46.0)
Hemoglobin: 13.4 g/dL (ref 12.0–15.0)
IMMATURE GRANULOCYTES: 1 %
Lymphocytes Relative: 18 %
Lymphs Abs: 1.3 10*3/uL (ref 0.7–4.0)
MCH: 30 pg (ref 26.0–34.0)
MCHC: 32.2 g/dL (ref 30.0–36.0)
MCV: 93.1 fL (ref 80.0–100.0)
Monocytes Absolute: 0.9 10*3/uL (ref 0.1–1.0)
Monocytes Relative: 12 %
Neutro Abs: 4.6 10*3/uL (ref 1.7–7.7)
Neutrophils Relative %: 61 %
PLATELETS: 222 10*3/uL (ref 150–400)
RBC: 4.47 MIL/uL (ref 3.87–5.11)
RDW: 13.6 % (ref 11.5–15.5)
WBC: 7.4 10*3/uL (ref 4.0–10.5)
nRBC: 0 % (ref 0.0–0.2)

## 2018-11-03 LAB — HEMOGLOBIN A1C
Hgb A1c MFr Bld: 5.7 % — ABNORMAL HIGH (ref 4.8–5.6)
Mean Plasma Glucose: 116.89 mg/dL

## 2018-11-03 NOTE — Assessment & Plan Note (Signed)
Tension type headache.  She is neurologically intact.  Discussed given new onset and persistence of obtaining brain imaging though she deferred this to see if her change in glasses prescription makes a difference.  If it does not she will contact us to complete brain imaging.

## 2018-11-03 NOTE — Assessment & Plan Note (Addendum)
Patient had several months of drenching night sweats.  Recently resolved.  I think it would be prudent to rule out underlying TB or bacteremia given the severity and persistence though I have low suspicion for TB or bacteremia given recent improvement.  Basic labs ordered as well.  Orders as outlined below.  We will check a chest x-ray as well.  The patient will go to the hospital to have her labs done and x-ray done.  CMA confirmed with the ED charge nurse that the protocol would be for the patient to have a mask on and no other specific interventions were needed.  CMA also contacted radiology to let them know that the patient was on her way in case they wanted to wear a mask.  She also attempted to contact the lab.

## 2018-11-03 NOTE — Addendum Note (Signed)
Addended by: Gracelyn NurseBLACKWELL, Karolyn Messing P on: 11/03/2018 02:47 PM   Modules accepted: Orders

## 2018-11-03 NOTE — Assessment & Plan Note (Signed)
No known injury.  Given tenderness we will obtain an x-ray of her ribs.

## 2018-11-03 NOTE — Progress Notes (Addendum)
Marikay Alar, MD Phone: [phone]  Crystal Haas is a 73 y.o. female who presents today for f/u.  CC: GI bleed, night sweats, rib pain, headache, OSA  GI bleed: Patient was hospitalized for a lower GI bleed.  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  She had no active bleeding at the time of the colonoscopy.  She notes no recurrent bleeding.  No abdominal pain with this.  She remains on Eliquis.  She follows up with GI at the end of the month.  Night sweats: Patient reports that she had persistent drenching night sweats 2-3 times a night for several months.  These have not occurred since getting out of the hospital.  She had no daytime symptoms.  No cough or fevers.  No weight loss.  She is up-to-date on cancer screening.  Previously evaluated with basic lab work and was recommended to have follow-up lab work and a chest x-ray though her symptoms had resolved at that time though appear to have recurred.  She sleeps with the house temperature at 62 F.  Right rib pain: Notes her right ribs would hurt with blood pressure check while in the hospital.  Once the BP cuff was released symptoms would resolve.  They have not recurred since discharge.  No known injury.  Headache: Patient notes for several weeks she has had a daily mild frontal dull headache.  She saw her eye doctor who did give her a stronger prescription.  She denies any vision changes.  She notes no stress.  No numbness or weakness.  Notes they typically occur late in the afternoon.  OSA: Notes she has been exhausted recently since getting out of the hospital.  All she wants to do is sleep.  When she gets up and gets going she does okay.  She does not wake up well rested.  Social History   Tobacco Use  Smoking Status Former Smoker  . Packs/day: 0.25  . Years: 2.00  . Pack years: 0.50  . Types: Cigarettes  . Last attempt to quit: 10/21/1969  . Years since quitting: 49.0  Smokeless Tobacco Never Used       ROS see history of present illness  Objective  Physical Exam Vitals:   11/03/18 1035  BP: 124/78  Pulse: 76  Temp: 98 F (36.7 C)  SpO2: 94%    BP Readings from Last 3 Encounters:  11/03/18 124/78  10/24/18 135/69  05/12/18 (!) 146/84   Wt Readings from Last 3 Encounters:  11/03/18 223 lb 3.2 oz (101.2 kg)  10/23/18 227 lb 4.8 oz (103.1 kg)  05/12/18 232 lb 8 oz (105.5 kg)    Physical Exam Constitutional:      General: She is not in acute distress.    Appearance: She is not diaphoretic.  Cardiovascular:     Rate and Rhythm: Normal rate. Rhythm irregularly irregular.     Heart sounds: Normal heart sounds.  Pulmonary:     Effort: Pulmonary effort is normal.     Breath sounds: Normal breath sounds.  Musculoskeletal:     Comments: Right mid ribs midaxillary location with tenderness, no palpable bony defects  Skin:    General: Skin is warm and dry.  Neurological:     Mental Status: She is alert.     Comments: CN 2-12 intact, 5/5 strength in bilateral biceps, triceps, grip, quads, hamstrings, plantar and dorsiflexion, sensation to light touch intact in bilateral UE and LE, normal gait      Assessment/Plan: Please  see individual problem list.  Lower GI bleed I discussed the nature of a diverticular bleed with the patient.  No recurrent bleeding.  She will keep her appointment with GI.  We will check a CBC.  She will monitor for recurrence.  Given return precautions.  Sleep apnea I suspect this may be contributing to her exhausted sensation though it could also be related to her recent hospitalization.  She will monitor her symptoms over the next week and if not improving we could send her for a CPAP titration study.  Night sweats Patient had several months of drenching night sweats.  Recently resolved.  I think it would be prudent to rule out underlying TB or bacteremia given the severity and persistence though I have low suspicion for TB or bacteremia given  recent improvement.  Basic labs ordered as well.  Orders as outlined below.  We will check a chest x-ray as well.  The patient will go to the hospital to have her labs done and x-ray done.  CMA confirmed with the ED charge nurse that the protocol would be for the patient to have a mask on and no other specific interventions were needed.  CMA also contacted radiology to let them know that the patient was on her way in case they wanted to wear a mask.  She also attempted to contact the lab.  Rib pain on right side No known injury.  Given tenderness we will obtain an x-ray of her ribs.  Headache Tension type headache.  She is neurologically intact.  Discussed given new onset and persistence of obtaining brain imaging though she deferred this to see if her change in glasses prescription makes a difference.  If it does not she will contact us to complete brain imaging.   Orders Placed This Encounter  Procedures  . Culture, blood (routine x 2)    Please collect from 2 different sites    Standing Status:   Future    Standing Expiration Date:   11/04/2019  . DG Ribs Unilateral Right    Standing Status:   Future    Standing Expiration Date:   01/02/2020    Order Specific Question:   Reason for Exam (SYMPTOM  OR DIAGNOSIS REQUIRED)    Answer:   right rib pain, focal tenderness of right mid ribs midaxillary region    Order Specific Question:   Preferred imaging location?    Answer:   [facility]    Order Specific Question:   Radiology Contrast Protocol - do NOT remove file path    Answer:   \\[email or web address]s.pdf  . DG Chest 2 View    Standing Status:   Future    Standing Expiration Date:   01/02/2020    Order Specific Question:   Reason for Exam (SYMPTOM  OR DIAGNOSIS REQUIRED)    Answer:   night sweats, right rib pain    Order Specific Question:   Preferred imaging location?    Answer:   [facility]    Order Specific Question:    Radiology Contrast Protocol - do NOT remove file path    Answer:   \\[email or web address]\DXFluoroContrastProtocols.pdf  . QuantiFERON-TB Gold Plus    Standing Status:   Future    Standing Expiration Date:   11/04/2019  . HIV antibody (with reflex)    Standing Status:   Future    Standing Expiration Date:   11/04/2019  . HgB A1c    Standing Status:   Future  Standing Expiration Date:   11/04/2019  . Basic Metabolic Panel (BMET)    Standing Status:   Future    Standing Expiration Date:   11/04/2019  . CBC w/Diff    Standing Status:   Future    Standing Expiration Date:   11/04/2019    No orders of the defined types were placed in this encounter.    Marikay Alar, MD [facility]

## 2018-11-03 NOTE — Patient Instructions (Addendum)
Nice to see you. We will get lab work to evaluate your night sweats. We are evaluating for TB and blood infection. Please monitor your exhaustion and if it is not improving please contact us to complete a CPAP titration.  Please monitor your headaches with your new glasses prescription and if they do not improve please let us know.  If you have recurrent bleeding please be evaluated.

## 2018-11-03 NOTE — Assessment & Plan Note (Addendum)
I discussed the nature of a diverticular bleed with the patient.  No recurrent bleeding.  She will keep her appointment with GI.  We will check a CBC.  She will monitor for recurrence.  Given return precautions.

## 2018-11-03 NOTE — Assessment & Plan Note (Signed)
I suspect this may be contributing to her exhausted sensation though it could also be related to her recent hospitalization.  She will monitor her symptoms over the next week and if not improving we could send her for a CPAP titration study.

## 2018-11-04 LAB — HIV ANTIBODY (ROUTINE TESTING W REFLEX): HIV Screen 4th Generation wRfx: NONREACTIVE

## 2018-11-06 ENCOUNTER — Telehealth: Payer: Self-pay | Admitting: Cardiovascular Disease

## 2018-11-06 DIAGNOSIS — Z5181 Encounter for therapeutic drug level monitoring: Secondary | ICD-10-CM

## 2018-11-06 DIAGNOSIS — I482 Chronic atrial fibrillation, unspecified: Secondary | ICD-10-CM

## 2018-11-06 NOTE — Telephone Encounter (Signed)
Returned the call to the patient. She is no longer getting assistance with the Eliquis and cannot afford it. She would like to know if Sophronia Simas would be an option for her. She has been made aware that Dr. Kirke Corin is currently out of the office. Message has been route to pharmd for any recommendations.

## 2018-11-06 NOTE — Telephone Encounter (Signed)
Patient calling  States that patient is no longer getting assistance and will be unable to afford Elquis Patient has heard of Jantoven 5 Mg and would like to know if that may be an option Please call to discuss

## 2018-11-08 LAB — CULTURE, BLOOD (ROUTINE X 2)
Culture: NO GROWTH
Culture: NO GROWTH
Special Requests: ADEQUATE
Special Requests: ADEQUATE

## 2018-11-09 ENCOUNTER — Inpatient Hospital Stay: Payer: PPO | Admitting: Family Medicine

## 2018-11-09 NOTE — Telephone Encounter (Signed)
Aspirin works different that [employer] or warfarin.  Will need to discuss Eliquis discontinuation with cardiologist.

## 2018-11-09 NOTE — Telephone Encounter (Signed)
Call returned to the patient. She has been educated on [place] and the need to come to the coumadin clinic for follow up.  She would like to know if she could just take aspirin as a replacement. If not, then she has agreed to start the [place].

## 2018-11-09 NOTE — Telephone Encounter (Signed)
Crystal Haas is warfarin. She is a candidate but will need an appointment with coumadin clinic for transition and follow up.

## 2018-11-10 DIAGNOSIS — J301 Allergic rhinitis due to pollen: Secondary | ICD-10-CM | POA: Diagnosis not present

## 2018-11-10 LAB — QUANTIFERON-TB GOLD PLUS (RQFGPL)
QuantiFERON Mitogen Value: 8.62 IU/mL
QuantiFERON Nil Value: 0.07 IU/mL
QuantiFERON TB1 Ag Value: 0.08 IU/mL
QuantiFERON TB2 Ag Value: 0.1 IU/mL

## 2018-11-10 LAB — QUANTIFERON-TB GOLD PLUS: QuantiFERON-TB Gold Plus: NEGATIVE

## 2018-11-12 NOTE — Telephone Encounter (Signed)
Aspirin does not work for [employer]. Warfarin is an alternative to Eliquis but should be managed in our anticoagulation clinic.

## 2018-11-13 NOTE — Telephone Encounter (Signed)
Call returned to the patient. She stated that she is willing to make the switch over to coumadin. Message has been routed to the coumadin clinic for the next step.

## 2018-11-16 DIAGNOSIS — G4733 Obstructive sleep apnea (adult) (pediatric): Secondary | ICD-10-CM | POA: Diagnosis not present

## 2018-11-16 MED ORDER — WARFARIN SODIUM 5 MG PO TABS: 5.0000 mg | ORAL_TABLET | ORAL | 0 refills | Status: DC

## 2018-11-16 NOTE — Telephone Encounter (Signed)
Spoke w/ pt.  She reports that she was receiving financial assistance for her Eliquis, but when she switched insurance this year, she was no longer eligible.   She states that she has about a week's worth of Eliquis left.  She is familiar w/ coumadin, as her husband used to take it.  She does not want to have her finger stuck due to bruising, but she understands that is how we get our samples. Advised her to continue Eliquis, start warfarin 5 mg on Thursday, take both meds until Saturday and then STOP Eliquis. She has a new pt appt w/ me in the coumadin clinic on Monday, 2/3 @ 10:30. Asked her to call back w/ any questions before that time.

## 2018-11-17 ENCOUNTER — Telehealth: Payer: Self-pay | Admitting: Cardiovascular Disease

## 2018-11-17 ENCOUNTER — Other Ambulatory Visit: Payer: Self-pay | Admitting: Family Medicine

## 2018-11-17 ENCOUNTER — Ambulatory Visit: Payer: PPO | Admitting: Gastroenterology

## 2018-11-17 ENCOUNTER — Encounter: Payer: Self-pay | Admitting: Gastroenterology

## 2018-11-17 VITALS — BP 180/83 | HR 75 | Ht 65.0 in | Wt 223.2 lb

## 2018-11-17 DIAGNOSIS — K921 Melena: Secondary | ICD-10-CM | POA: Diagnosis not present

## 2018-11-17 DIAGNOSIS — J301 Allergic rhinitis due to pollen: Secondary | ICD-10-CM | POA: Diagnosis not present

## 2018-11-17 NOTE — Telephone Encounter (Signed)
Pt states she needs some Eliquis, 11 pills to get her through until she starts her coumadin.

## 2018-11-17 NOTE — Progress Notes (Signed)
Melodie Bouillon, MD [address]  [address]  [place], [place] [address]  Main: [phone]  Fax: [phone]   Primary Care Physician: Glori Luis, MD   Chief complaint: Hospital follow-up for GI bleed  HPI: Crystal Haas is a 73 y.o. female recently seen in hospital on October 23, 2018 due to hematochezia.  Patient denies any further hematochezia since hospital discharge. The patient denies abdominal or flank pain, anorexia, nausea or vomiting, dysphagia, change in bowel habits or black or bloody stools or weight loss.  Patient underwent colonoscopy on October 24, 2018 with nonbleeding diverticuli seen.  Multiple old diverticuli were reported.  Nonbleeding internal hemorrhoids were also reported.  Impression was that patient's bleeding was from diverticular bleeding based on old blood seen in the colon.  No active bleeding was seen.  Prior to this, her previous colonoscopy was in November 2014 by Dr. Daleen Squibb for screening and diarrhea.  Extent of exam was cecum, prep was noted to be excellent.  Sigmoid diverticulosis was reported.  Biopsies were negative for microscopic colitis.  Patient denies any family history of colon cancer.  Current Outpatient Medications  Medication Sig Dispense Refill  . atorvastatin (LIPITOR) 20 MG tablet TAKE 1 TABLET BY MOUTH ONCE DAILY 90 tablet 0  . B Complex-C (B-COMPLEX WITH VITAMIN C) tablet Take 1 tablet by mouth daily.    . cetirizine (ZYRTEC) 10 MG tablet Take 10 mg by mouth daily.    . Cholecalciferol (VITAMIN D3) 2000 units TABS Take 2,000 Units by mouth daily.    . CYANOCOBALAMIN PO Take 1 tablet by mouth daily.    Marland Kitchen EPIPEN 2-PAK 0.3 MG/0.3ML SOAJ injection Inject 0.3 mg as directed as directed. AS NEEDED FOR ANAPHYLAXIS     . fluticasone (FLONASE) 50 MCG/ACT nasal spray Place 2 sprays into both nostrils daily.     Marland Kitchen losartan (COZAAR) 25 MG tablet Take 1 tablet (25 mg total) by mouth daily. 90 tablet 1  . montelukast (SINGULAIR) 10  MG tablet Take 10 mg by mouth at bedtime.    . Multiple Vitamin (MULTIVITAMIN WITH MINERALS) TABS tablet Take 1 tablet by mouth daily. One-A-Day Active 65+    . NON FORMULARY 10 each by Other route daily. GIN SOAKED RAISINS FOR PAIN RELIEF    . Olopatadine HCl (PATADAY) 0.2 % SOLN Place 1 drop into both eyes daily.    Trellis Moment CHEW Chew 3-4 each by mouth as needed.    Marland Kitchen PARoxetine (PAXIL) 20 MG tablet Take 1 tablet (20 mg total) by mouth daily. (Patient taking differently: Take 20 mg by mouth every morning. ) 90 tablet 2  . warfarin (COUMADIN) 5 MG tablet Take 1 tablet (5 mg total) by mouth as directed. As directed by the coumadin clinic. 30 tablet 0  . zolpidem (AMBIEN) 5 MG tablet TAKE 1 TABLET BY MOUTH AT BEDTIME AS NEEDED FOR SLEEP 90 tablet 0   No current facility-administered medications for this visit.     Allergies as of 11/17/2018 - Review Complete 11/03/2018  Allergen Reaction Noted  . Apple Anaphylaxis 05/03/2015  . Daucus carota Anaphylaxis 05/03/2015  . Ivp dye [iodinated diagnostic agents] Shortness Of Breath 04/28/2015  . Other Swelling 04/28/2015  . Strawberry (diagnostic) Anaphylaxis 08/30/2015  . Strawberry extract Anaphylaxis 05/03/2015  . Iodine Swelling 05/03/2015  . Tape Other (See Comments) 04/28/2015    ROS:  General: Negative for anorexia, weight loss, fever, chills, fatigue, weakness. ENT: Negative for hoarseness, difficulty swallowing , nasal congestion.  CV: Negative for chest pain, angina, palpitations, dyspnea on exertion, peripheral edema.  Respiratory: Negative for dyspnea at rest, dyspnea on exertion, cough, sputum, wheezing.  GI: See history of present illness. GU:  Negative for dysuria, hematuria, urinary incontinence, urinary frequency, nocturnal urination.  Endo: Negative for unusual weight change.    Physical Examination: Vitals:   11/17/18 1526  BP: (!) 179/74  Pulse: 72  Weight: 223 lb 3.2 oz (101.2 kg)  Height: 5\' 5"  (1.651 m)     General: Well-nourished, well-developed in no acute distress.  Eyes: No icterus. Conjunctivae pink. Mouth: Oropharyngeal mucosa moist and pink , no lesions erythema or exudate. Neck: Supple, Trachea midline Abdomen: Bowel sounds are normal, nontender, nondistended, no hepatosplenomegaly or masses, no abdominal bruits or hernia , no rebound or guarding.   Extremities: No lower extremity edema. No clubbing or deformities. Neuro: Alert and oriented x 3.  Grossly intact. Skin: Warm and dry, no jaundice.   Psych: Alert and cooperative, normal mood and affect.   Labs: CMP     Component Value Date/Time   NA 140 11/03/2018 1339   NA 146 (H) 09/04/2015 1051   NA 143 05/10/2014 1847   K 4.0 11/03/2018 1339   K 3.8 05/10/2014 1847   CL 104 11/03/2018 1339   CL 108 (H) 05/10/2014 1847   CO2 26 11/03/2018 1339   CO2 29 05/10/2014 1847   GLUCOSE 99 11/03/2018 1339   GLUCOSE 119 (H) 05/10/2014 1847   BUN 15 11/03/2018 1339   BUN 15 09/04/2015 1051   BUN 12 05/10/2014 1847   CREATININE 0.94 11/03/2018 1339   CREATININE 0.91 05/10/2014 1847   CALCIUM 9.5 11/03/2018 1339   CALCIUM 8.7 05/10/2014 1847   PROT 7.3 10/22/2018 2107   PROT 6.6 09/04/2015 1051   PROT 7.4 05/10/2014 1847   ALBUMIN 4.2 10/22/2018 2107   ALBUMIN 4.4 09/04/2015 1051   ALBUMIN 3.9 05/10/2014 1847   AST 20 10/22/2018 2107   AST 14 (L) 05/10/2014 1847   ALT 25 10/22/2018 2107   ALT 28 05/10/2014 1847   ALKPHOS 69 10/22/2018 2107   ALKPHOS 55 05/10/2014 1847   BILITOT 1.0 10/22/2018 2107   BILITOT 0.4 09/04/2015 1051   BILITOT 0.5 05/10/2014 1847   GFRNONAA >60 11/03/2018 1339   GFRNONAA >60 05/10/2014 1847   GFRAA >60 11/03/2018 1339   GFRAA >60 05/10/2014 1847   Lab Results  Component Value Date   WBC 7.4 11/03/2018   HGB 13.4 11/03/2018   HCT 41.6 11/03/2018   MCV 93.1 11/03/2018   PLT 222 11/03/2018    Imaging Studies: Dg Chest 2 View  Result Date: 11/03/2018 CLINICAL DATA:  Right-sided  chest pain for 2 weeks, no known injury, initial encounter EXAM: CHEST - 2 VIEW COMPARISON:  None. FINDINGS: Cardiac shadows within normal limits. The lungs are well aerated bilaterally. No focal infiltrate or sizable effusion is seen. No acute bony abnormality is noted. IMPRESSION: No active cardiopulmonary disease. Electronically Signed   By: Alcide CleverMark  Lukens M.D.   On: 11/03/2018 19:33   Dg Ribs Unilateral Right  Result Date: 11/03/2018 CLINICAL DATA:  Right-sided rib pain for 2 weeks, no known injury, initial encounter EXAM: RIGHT RIBS - 2 VIEW COMPARISON:  None. FINDINGS: No fracture or other bone lesions are seen involving the ribs. IMPRESSION: No acute abnormality noted. Electronically Signed   By: Alcide CleverMark  Lukens M.D.   On: 11/03/2018 19:31    Assessment and Plan:   Delene LollDiane Mcjunkins is a 73  y.o. y/o female with recent admission for hematochezia which has resolved, and was due to diverticulosis  High-fiber diet MiraLAX or Metamucil daily with goal of 1-2 soft bowel movements daily.  If not at goal, patient instructed to increase dose to twice daily.  If loose stools with the medication, patient asked to decrease the medication to every other day, or half dose daily.  Patient verbalized understanding  Patient last screening colonoscopy was in November 2014 and was normal except diverticulosis.  She does not have any family history of colon cancer.  Based on this, her next colonoscopy should be in 10 years from November 2014 which would be November 2024.  If patient has any further episodes of GI bleeding she was asked to notify us immediately or go to the ER and she verbalized understanding.  repeat lab work since her hospital admission was recently done by primary care provider on January 14 and hemoglobin has improved and now normal at 13.4 compared to 11.5 during her admission.  Follow-up with [place] as needed, follow-up with primary care provider as scheduled.  Pt has elevated BP today. Denies Chest  pain, headache, blurry vision, dizziness. I have asked her to follow up with her PCP and have messaged Dr. Birdie Sons as well. If she develops any symptoms such as above I have asked her to go to the ER and she verbalized understanding  Dr Melodie Bouillon

## 2018-11-17 NOTE — Telephone Encounter (Signed)
Spoke w/ pt to clarify, as she reported yesterday that she had a week's worth of Eliquis, but she does not have any more left.   She is scheduled to start coumadin on Thursday, but will need to overlap until Saturday. Advised her that I am routing this back to [place] office to see if they can provide her w/ enough samples to get her through that time.   Thank you!

## 2018-11-17 NOTE — Telephone Encounter (Signed)
Spoke with patient.  Made her aware that I was going to place samples of Eliquis 5MG  upfront for her to pick up.   Medication Samples have been provided to the patient.  Drug name: Eliquis       Strength: 5MG         Qty: 2 boxes  LOT: [id number]  Exp.Date: June 2022  Margrett Rud 9:49 AM 11/17/2018.

## 2018-11-19 ENCOUNTER — Telehealth: Payer: Self-pay | Admitting: Family Medicine

## 2018-11-19 NOTE — Telephone Encounter (Signed)
Can you please contact the patient and see if she has been checking her blood pressure at home?  If she has please find out what it has been running.  Can you also get her set up for a nurse BP check in the office?  Thanks.

## 2018-11-19 NOTE — Telephone Encounter (Signed)
-----   Message from Pasty Spillers, MD sent at 11/17/2018  3:36 PM EST ----- Hi,  This patient's BP was elevated in the office today. She is asymptomatic. Could you please follow up with her in regard to this? Thank you!

## 2018-11-19 NOTE — Telephone Encounter (Signed)
Called and spoke with patient. Pt has NOT been able to check her BP at home. Pt has been scheduled for a [place] BP check. Sent to PCP as an FYI>

## 2018-11-20 ENCOUNTER — Telehealth: Payer: Self-pay | Admitting: Cardiovascular Disease

## 2018-11-20 MED ORDER — APIXABAN 5 MG PO TABS: 5.0000 mg | ORAL_TABLET | Freq: Two times a day (BID) | ORAL | 3 refills | Status: DC

## 2018-11-20 NOTE — Telephone Encounter (Signed)
I spoke with the patient. She is aware that I will pull patient assistance paperwork for her and that we will complete our portion. 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  She is aware that I will place the paperwork at the front desk for pick up and completion and she can leave it here for [place] to file when she is done.   The patient voices understanding and is agreeable.   I have also pulled an additional box of eliquis samples for her:  Eliquis 5 mg Lot: [id number] Exp: 6/22 # 1 box given

## 2018-11-20 NOTE — Telephone Encounter (Signed)
I attempted to call the patient. I left a message for her to call back so we can assist her with patient assistance for Eliquis.

## 2018-11-20 NOTE — Telephone Encounter (Signed)
Pt would like to hold off on starting Coumdain, would like to pursue getting assistance with Eliquis. Please call to discuss.

## 2018-11-23 DIAGNOSIS — J301 Allergic rhinitis due to pollen: Secondary | ICD-10-CM | POA: Diagnosis not present

## 2018-11-23 MED ORDER — APIXABAN 5 MG PO TABS: 5.0000 mg | ORAL_TABLET | Freq: Two times a day (BID) | ORAL | 3 refills | Status: DC

## 2018-11-23 NOTE — Telephone Encounter (Signed)
Patient brought in completed forms she printed from home . Placed in nurse box

## 2018-11-23 NOTE — Telephone Encounter (Signed)
Forms have been received. Awaiting provider signature.

## 2018-11-24 ENCOUNTER — Telehealth: Payer: Self-pay | Admitting: Family Medicine

## 2018-11-24 NOTE — Telephone Encounter (Signed)
Copied from CRM [id number]. Topic: Quick Communication - Rx Refill/Question >> Nov 24, 2018 10:14 AM Tamela Oddi wrote: Medication: zolpidem (AMBIEN) 5 MG tablet  Patient called to request a refill for the above medication  Preferred Pharmacy (with phone number or street name): [facility] - [address] [place], [place] - [address] [address] [address] [phone] (Phone) [phone] (Fax)

## 2018-11-24 NOTE — Telephone Encounter (Signed)
Medication requested not delegated to NT to refill 

## 2018-11-24 NOTE — Telephone Encounter (Signed)
Last refill for 90 on 11/19, and last OV 11/03/18? OK to fill?

## 2018-11-25 MED ORDER — ZOLPIDEM TARTRATE 5 MG PO TABS: 5.0000 mg | ORAL_TABLET | Freq: Every evening | ORAL | 0 refills | Status: DC | PRN

## 2018-11-25 NOTE — Addendum Note (Signed)
Addended by: Glori Luis on: 11/25/2018 06:09 PM   Modules accepted: Orders

## 2018-11-25 NOTE — Telephone Encounter (Signed)
Controlled substance database reviewed. Sent to pharmacy.   

## 2018-11-27 ENCOUNTER — Ambulatory Visit: Payer: Self-pay

## 2018-11-27 NOTE — Telephone Encounter (Signed)
I spoke with patient & she stated that she took a shower, but was feeling no better. She is trying to get friend to take her to UC in [place].

## 2018-11-27 NOTE — Telephone Encounter (Signed)
Yes she needs to be checked out. Dizziness could be vertigo, but there are many other possible causes too  Either at a [facility] clinic, UC or ED would be good to have her assessed

## 2018-11-27 NOTE — Telephone Encounter (Signed)
Patient called in with c/o "dizziness." She says "on Wednesday I had a headache, it went away. On yesterday around 3 pm, I had vertigo and today it's worse. I can't even stand up to walk without feeling like I'm walking crooked and side ways. I can walk, just unsteady." I asked about other symptoms, she denies. According to protocol, see PCP within 24 hours, no availability with PCP or any provider in the practice. I called and spoke to the [facility] at [facility] who says they take  patient's, but have no openings today. I advised the patient I could schedule at another [place] location in [place] or she could go to the UC or ED today. She says she will go to the UC today and find someone to drive her.    Reason for Disposition . [1] MODERATE dizziness (e.g., vertigo; feels very unsteady, interferes with normal activities) AND [2] has NOT been evaluated by physician for this  Answer Assessment - Initial Assessment Questions 1. DESCRIPTION: "Describe your dizziness."     Vertigo 2. VERTIGO: "Do you feel like either you or the room is spinning or tilting?"      Yes 3. LIGHTHEADED: "Do you feel lightheaded?" (e.g., somewhat faint, woozy, weak upon standing)     Yes 4. SEVERITY: "How bad is it?"  "Can you walk?"   - MILD - Feels unsteady but walking normally.   - MODERATE - Feels very unsteady when walking, but not falling; interferes with normal activities (e.g., school, work) .   - SEVERE - Unable to walk without falling (requires assistance).     Moderate 5. ONSET:  "When did the dizziness begin?"     Yesterday around 3 pm 6. AGGRAVATING FACTORS: "Does anything make it worse?" (e.g., standing, change in head position)     Standing, change head position 7. CAUSE: "What do you think is causing the dizziness?"     I don't know 8. RECURRENT SYMPTOM: "Have you had dizziness before?" If so, ask: "When was the last time?" "What happened that time?"     No 9. OTHER SYMPTOMS: "Do you have  any other symptoms?" (e.g., headache, weakness, numbness, vomiting, earache)     Headache on Wednesday 10. PREGNANCY: "Is there any chance you are pregnant?" "When was your last menstrual period?"       No  Protocols used: DIZZINESS - VERTIGO-A-AH

## 2018-11-30 ENCOUNTER — Telehealth: Payer: Self-pay

## 2018-11-30 DIAGNOSIS — R51 Headache: Secondary | ICD-10-CM | POA: Diagnosis not present

## 2018-11-30 DIAGNOSIS — H903 Sensorineural hearing loss, bilateral: Secondary | ICD-10-CM | POA: Diagnosis not present

## 2018-11-30 DIAGNOSIS — I10 Essential (primary) hypertension: Secondary | ICD-10-CM | POA: Diagnosis not present

## 2018-11-30 DIAGNOSIS — H8110 Benign paroxysmal vertigo, unspecified ear: Secondary | ICD-10-CM | POA: Diagnosis not present

## 2018-11-30 DIAGNOSIS — H6121 Impacted cerumen, right ear: Secondary | ICD-10-CM | POA: Diagnosis not present

## 2018-11-30 DIAGNOSIS — I4891 Unspecified atrial fibrillation: Secondary | ICD-10-CM | POA: Diagnosis not present

## 2018-11-30 NOTE — Telephone Encounter (Signed)
Copied from CRM [id number]. Topic: General - Other >> Nov 30, 2018 12:17 PM Crystal Haas A wrote: Reason for CRM: Patient called to say that she was seen in the morning at ENT and BP was 110/80

## 2018-11-30 NOTE — Telephone Encounter (Signed)
Patient made aware that we are waiting for a response from Patient assistance.  Samples provided to patient.   Drug name: Eliquis       Strength: 5MG         Qty: 3 boxes  LOT: [id number]  Exp.Date: 06/22  Margrett Rud 10:59 AM 11/30/2018

## 2018-11-30 NOTE — Telephone Encounter (Signed)
Forms faxed to [facility] at [phone]

## 2018-11-30 NOTE — Telephone Encounter (Signed)
Patient calling to check status of paperwork for assistance - will need more samples while waiting   Patient calling the office for samples of medication:   1.  What medication and dosage are you requesting samples for? Eliquis 5 MG    2.  Are you currently out of this medication? Will be out soon

## 2018-11-30 NOTE — Telephone Encounter (Signed)
Sent to PCP as an FYI on BP

## 2018-11-30 NOTE — Telephone Encounter (Signed)
Noted.  Please find out if there is a particular reason that she called to let us know what her blood pressure has been running.

## 2018-12-01 ENCOUNTER — Ambulatory Visit: Payer: PPO

## 2018-12-01 DIAGNOSIS — J301 Allergic rhinitis due to pollen: Secondary | ICD-10-CM | POA: Diagnosis not present

## 2018-12-01 NOTE — Telephone Encounter (Signed)
Pt stated that she wanted to inform you of her BP since she was NOT able to come in to do the [place] BP check.   FYI

## 2018-12-01 NOTE — Telephone Encounter (Signed)
Noted.  Her blood pressure is well controlled.  She should periodically monitor this at home if possible.

## 2018-12-01 NOTE — Telephone Encounter (Signed)
Called pt and left a detailed VM that BP is well controlled and pt should periodically monitor this at home.

## 2018-12-04 DIAGNOSIS — H8110 Benign paroxysmal vertigo, unspecified ear: Secondary | ICD-10-CM | POA: Diagnosis not present

## 2018-12-07 MED ORDER — ZOLPIDEM TARTRATE 5 MG PO TABS: 5.0000 mg | ORAL_TABLET | Freq: Every evening | ORAL | 0 refills | Status: DC | PRN

## 2018-12-07 NOTE — Telephone Encounter (Signed)
Sent to pharmacy 

## 2018-12-07 NOTE — Telephone Encounter (Signed)
Are you ok with sending Ambien to mail order?

## 2018-12-07 NOTE — Telephone Encounter (Signed)
[facility] - [place], [place] - [address] [address] [phone] (Phone) [phone] (Fax)   Patient is calling to state the [facility] pharmacy is to expensive and she needs her RX to be sent to the pharmacy listed above. Please advise

## 2018-12-07 NOTE — Addendum Note (Signed)
Addended by: Glori Luis on: 12/07/2018 07:35 PM   Modules accepted: Orders

## 2018-12-09 ENCOUNTER — Other Ambulatory Visit: Payer: Self-pay | Admitting: Family Medicine

## 2018-12-09 DIAGNOSIS — F32A Depression, unspecified: Secondary | ICD-10-CM

## 2018-12-09 DIAGNOSIS — F329 Major depressive disorder, single episode, unspecified: Secondary | ICD-10-CM

## 2018-12-14 NOTE — Telephone Encounter (Signed)
Patient calling  States that she spoke with [facility] and they state they have not received anything and have nothing on file  Patient would like for forms to be faxed again  States that if needed her account is [id number]

## 2018-12-15 ENCOUNTER — Telehealth: Payer: Self-pay

## 2018-12-15 DIAGNOSIS — J301 Allergic rhinitis due to pollen: Secondary | ICD-10-CM | POA: Diagnosis not present

## 2018-12-15 NOTE — Telephone Encounter (Addendum)
Received Notification from [employer] Patient assistance [employer] that the application for Eliquis has been denied.  Please advise. Thank You.

## 2018-12-16 NOTE — Telephone Encounter (Signed)
Assistance has been denied. Please see other epic note.

## 2018-12-16 NOTE — Telephone Encounter (Signed)
Call placed to the patient to inform her of the denial due to income.   She would like to know what her other options are at this point. She was informed that she could start on coumadin but she does not want to do that since she cannot eat salads.

## 2018-12-17 ENCOUNTER — Other Ambulatory Visit: Payer: Self-pay | Admitting: Cardiovascular Disease

## 2018-12-17 NOTE — Telephone Encounter (Signed)
Left a message for the patient to call back.  

## 2018-12-17 NOTE — Telephone Encounter (Signed)
The other option is Xarelto but it is likely to be the same price.  Warfarin is an option.  She can eat salads if she is on warfarin but she has to eat consistent amount.  If she chooses warfarin, she can come to our anticoagulation clinic and get education about what to expect.

## 2018-12-17 NOTE — Telephone Encounter (Signed)
Patient returning call.

## 2018-12-18 ENCOUNTER — Other Ambulatory Visit: Payer: Self-pay | Admitting: Cardiovascular Disease

## 2018-12-18 NOTE — Telephone Encounter (Signed)
Patient calling  States that she has thought about it and decided to go with Eliquis 5 MG Will need a prescription sent in to [facility] on [address] for 90 days

## 2018-12-18 NOTE — Telephone Encounter (Signed)
Eliquis 5 mg - take 1 tablet BID prescription was sent to the pharmacy ([facility] on [address]) earlier today. #180 w/ 1 refill.

## 2018-12-18 NOTE — Telephone Encounter (Signed)
Please see note below. 

## 2018-12-18 NOTE — Telephone Encounter (Signed)
Refill Request.  

## 2018-12-18 NOTE — Telephone Encounter (Signed)
Returned the call to the patient. She stated that she would like to take the weekend to think about it and will call back early next week.

## 2018-12-23 DIAGNOSIS — J301 Allergic rhinitis due to pollen: Secondary | ICD-10-CM | POA: Diagnosis not present

## 2019-01-05 DIAGNOSIS — J301 Allergic rhinitis due to pollen: Secondary | ICD-10-CM | POA: Diagnosis not present

## 2019-01-11 ENCOUNTER — Telehealth: Payer: Self-pay | Admitting: Cardiovascular Disease

## 2019-01-11 ENCOUNTER — Other Ambulatory Visit: Payer: Self-pay

## 2019-01-11 ENCOUNTER — Telehealth: Payer: Self-pay | Admitting: Family Medicine

## 2019-01-11 MED ORDER — LOSARTAN POTASSIUM 25 MG PO TABS: 25.0000 mg | ORAL_TABLET | Freq: Every day | ORAL | 0 refills | Status: DC

## 2019-01-11 NOTE — Telephone Encounter (Signed)
Does she need refills of all these medications? She should not need a refill of her Remus Loffler as she received a 90 day supply about a month ago.

## 2019-01-11 NOTE — Telephone Encounter (Signed)
Requested Prescriptions   Signed Prescriptions Disp Refills  . losartan (COZAAR) 25 MG tablet 90 tablet 0    Sig: Take 1 tablet (25 mg total) by mouth daily.    Authorizing Provider: ARIDA, MUHAMMAD A    Ordering User: Jaylenne Hamelin N    

## 2019-01-11 NOTE — Telephone Encounter (Signed)
Last OV 11/03/2018  lipitor  Zyrtec epipen flonase singulair Paxil ambein

## 2019-01-11 NOTE — Telephone Encounter (Signed)
°*  STAT* If patient is at the pharmacy, call can be transferred to refill team.   1. Which medications need to be refilled? (please list name of each medication and dose if known)    Losartan 25 mg po q d   2. Which pharmacy/location (including street and city if local pharmacy) is medication to be sent to?    [facility]   3. Do they need a 30 day or 90 day supply? 90

## 2019-01-11 NOTE — Telephone Encounter (Signed)
Copied from CRM [id number]. Topic: Quick Communication - Rx Refill/Question >> Jan 11, 2019  2:56 PM [place], [place] D wrote: Medication:atorvastatin (LIPITOR) 20 MG tablet / cetirizine (ZYRTEC) 10 MG tablet / EPIPEN 2-PAK 0.3 MG/0.3ML SOAJ injection / fluticasone (FLONASE) 50 MCG/ACT nasal spray / montelukast (SINGULAIR) 10 MG tablet / PARoxetine (PAXIL) 20 MG tablet / zolpidem (AMBIEN) 5 MG tablet  Has the patient contacted their pharmacy? Yes.   (Agent: If no, request that the patient contact the pharmacy for the refill.) (Agent: If yes, when and what did the pharmacy advise?)  Preferred Pharmacy (with phone number or street name): [facility] - [place], [place] - [address] [place] [phone] (Phone) [phone] (Fax)  Agent: Please be advised that RX refills may take up to 3 business days. We ask that you follow-up with your pharmacy.

## 2019-01-11 NOTE — Telephone Encounter (Signed)
Requested Prescriptions   Signed Prescriptions Disp Refills  . losartan (COZAAR) 25 MG tablet 90 tablet 0    Sig: Take 1 tablet (25 mg total) by mouth daily.    Authorizing Provider: Lorine Bears A    Ordering User: Margrett Rud

## 2019-01-11 NOTE — Telephone Encounter (Signed)
Last OV 11/03/2018   BOLD are things you have refilled   lipitor 11/17/2018 disp 90 with no refills   Zyrtec - has not been refilled by Birdie Sons   epipen- last refilled in 2017 not by Dr. Birdie Sons was filled by Iran Ouch, MD  flonase 03/07/2016 by Iran Ouch, MD  singulair- has not been refilled by Birdie Sons   paxil last refilled 12/09/2018 disp 90 with no refills   ambein last refilled 12/07/2018 disp 90 with no refills   Next OV NONE schedule   Sent to PCP for approval for controled will refill everything else with PCP approval

## 2019-01-13 NOTE — Telephone Encounter (Signed)
Called pt and left a VM to call back. CRM created and sent to PEC pool. 

## 2019-01-14 MED ORDER — LOSARTAN POTASSIUM 25 MG PO TABS: 25.0000 mg | ORAL_TABLET | Freq: Every day | ORAL | 1 refills | Status: DC

## 2019-01-14 NOTE — Telephone Encounter (Signed)
Patient is returning a call to [place].  Please call patient back to explain.  CB# [phone]

## 2019-01-14 NOTE — Telephone Encounter (Signed)
Called pt and left a VM to call back. CRM created and sent to PEC pool. 

## 2019-01-14 NOTE — Telephone Encounter (Signed)
Called and spoke with pt she stated that the only thing she needs a refill on is losartan pt stated that she is switching pharmacy. Losartan sent for a 90 day supply as requested by patient.

## 2019-01-19 ENCOUNTER — Telehealth: Payer: Self-pay | Admitting: Cardiovascular Disease

## 2019-01-19 NOTE — Telephone Encounter (Signed)
Virtual Visit Pre-Appointment Phone Call  Steps For Call:  1. Confirm consent - "In the setting of the current Covid19 crisis, you are scheduled for a (phone or video) visit with your provider on (date) at (time).  Just as we do with many in-office visits, in order for you to participate in this visit, we must obtain consent.  If you'd like, I can send this to your mychart (if signed up) or email for you to review.  Otherwise, I can obtain your verbal consent now.  All virtual visits are billed to your insurance company just like a normal visit would be.  By agreeing to a virtual visit, we'd like you to understand that the technology does not allow for your provider to perform an examination, and thus may limit your provider's ability to fully assess your condition.  Finally, though the technology is pretty good, we cannot assure that it will always work on either your or our end, and in the setting of a video visit, we may have to convert it to a phone-only visit.  In either situation, we cannot ensure that we have a secure connection.  Are you willing to proceed?"  2. Give patient instructions for WebEx download to smartphone as below if video visit  3. Advise patient to be prepared with any vital sign or heart rhythm information, their current medicines, and a piece of paper and pen handy for any instructions they may receive the day of their visit  4. Inform patient they will receive a phone call 15 minutes prior to their appointment time (may be from unknown caller ID) so they should be prepared to answer  5. Confirm that appointment type is correct in Epic appointment notes (video vs telephone)    TELEPHONE CALL NOTE  Starlette Harber has been deemed a candidate for a follow-up tele-health visit to limit community exposure during the Covid-19 pandemic. I spoke with the patient via phone to ensure availability of phone/video source, confirm preferred email & phone number, and discuss  instructions and expectations.  I reminded Tatianah Sustaita to be prepared with any vital sign and/or heart rhythm information that could potentially be obtained via home monitoring, at the time of her visit. I reminded Shanikia Badertscher to expect a phone call at the time of her visit if her visit.  Did the patient verbally acknowledge consent to treatment? YES  Joline Maxcy 01/19/2019 1:25 PM   DOWNLOADING THE WEBEX SOFTWARE TO SMARTPHONE  - If Apple, go to [employer] and type in WebEx in the search bar. Download [employer], the blue/green circle. The app is free but as with any other app downloads, their phone may require them to verify saved payment information or Apple password. The patient does NOT have to create an account.  - If Android, ask patient to go to [employer] and type in WebEx in the search bar. Download [employer], the blue/green circle. The app is free but as with any other app downloads, their phone may require them to verify saved payment information or Android password. The patient does NOT have to create an account.   CONSENT FOR TELE-HEALTH VISIT - PLEASE REVIEW  I hereby voluntarily request, consent and authorize [facility] and its employed or contracted physicians, physician assistants, nurse practitioners or other licensed health care professionals (the Practitioner), to provide me with telemedicine health care services (the Services") as deemed necessary by the treating Practitioner. I acknowledge and consent  to receive the Services by the Practitioner via telemedicine. I understand that the telemedicine visit will involve communicating with the Practitioner through live audiovisual communication technology and the disclosure of certain medical information by electronic transmission. I acknowledge that I have been given the opportunity to request an in-person assessment or other available alternative prior to the telemedicine visit and am  voluntarily participating in the telemedicine visit.  I understand that I have the right to withhold or withdraw my consent to the use of telemedicine in the course of my care at any time, without affecting my right to future care or treatment, and that the Practitioner or I may terminate the telemedicine visit at any time. I understand that I have the right to inspect all information obtained and/or recorded in the course of the telemedicine visit and may receive copies of available information for a reasonable fee.  I understand that some of the potential risks of receiving the Services via telemedicine include:   Delay or interruption in medical evaluation due to technological equipment failure or disruption;  Information transmitted may not be sufficient (e.g. poor resolution of images) to allow for appropriate medical decision making by the Practitioner; and/or   In rare instances, security protocols could fail, causing a breach of personal health information.  Furthermore, I acknowledge that it is my responsibility to provide information about my medical history, conditions and care that is complete and accurate to the best of my ability. I acknowledge that Practitioner's advice, recommendations, and/or decision may be based on factors not within their control, such as incomplete or inaccurate data provided by me or distortions of diagnostic images or specimens that may result from electronic transmissions. I understand that the practice of medicine is not an exact science and that Practitioner makes no warranties or guarantees regarding treatment outcomes. I acknowledge that I will receive a copy of this consent concurrently upon execution via email to the email address I last provided but may also request a printed copy by calling the office of [facility].    I understand that my insurance will be billed for this visit.   I have read or had this consent read to me.  I understand the  contents of this consent, which adequately explains the benefits and risks of the Services being provided via telemedicine.   I have been provided ample opportunity to ask questions regarding this consent and the Services and have had my questions answered to my satisfaction.  I give my informed consent for the services to be provided through the use of telemedicine in my medical care  By participating in this telemedicine visit I agree to the above.

## 2019-01-20 ENCOUNTER — Other Ambulatory Visit: Payer: Self-pay

## 2019-01-20 ENCOUNTER — Telehealth (INDEPENDENT_AMBULATORY_CARE_PROVIDER_SITE_OTHER): Payer: PPO | Admitting: Cardiovascular Disease

## 2019-01-20 ENCOUNTER — Encounter: Payer: Self-pay | Admitting: Cardiovascular Disease

## 2019-01-20 VITALS — HR 58 | Ht 65.0 in | Wt 213.0 lb

## 2019-01-20 DIAGNOSIS — I10 Essential (primary) hypertension: Secondary | ICD-10-CM

## 2019-01-20 DIAGNOSIS — I482 Chronic atrial fibrillation, unspecified: Secondary | ICD-10-CM | POA: Diagnosis not present

## 2019-01-20 DIAGNOSIS — M1710 Unilateral primary osteoarthritis, unspecified knee: Secondary | ICD-10-CM

## 2019-01-20 NOTE — Patient Instructions (Signed)
Medication Instructions:  No change If you need a refill on your cardiac medications before your next appointment, please call your pharmacy.   Lab work: None If you have labs (blood work) drawn today and your tests are completely normal, you will receive your results only by: . MyChart Message (if you have MyChart) OR . A paper copy in the mail If you have any lab test that is abnormal or we need to change your treatment, we will call you to review the results.  Testing/Procedures: None  Follow-Up: At [facility], you and your health needs are our priority.  As part of our continuing mission to provide you with exceptional heart care, we have created designated Provider Care Teams.  These Care Teams include your primary Cardiologist (physician) and Advanced Practice Providers (APPs -  Physician Assistants and Nurse Practitioners) who all work together to provide you with the care you need, when you need it. You will need a follow up appointment in 6 months.  Please call our office 2 months in advance to schedule this appointment.  You may see Anabel Lykins, MD or one of the following Advanced Practice Providers on your designated Care Team:   Christopher Berge, NP Ryan Dunn, PA-C . Jacquelyn Visser, PA-C  Any Other Special Instructions Will Be Listed Below (If Applicable).    

## 2019-01-20 NOTE — Progress Notes (Signed)
Virtual Visit via Video Note    Evaluation Performed:  Follow-up visit  This visit type was conducted due to national recommendations for restrictions regarding the COVID-19 Pandemic (e.g. social distancing).  This format is felt to be most appropriate for this patient at this time.  All issues noted in this document were discussed and addressed.  No physical exam was performed (except for noted visual exam findings with Video Visits).  Please refer to the patient's chart (MyChart message for video visits and phone note for telephone visits) for the patient's consent to telehealth for [facility].  Date:  01/20/2019   ID:  Crystal Haas, DOB 1945/11/06, MRN [id number]  Patient Location:    Provider location:   [facility]  PCP:  Glori Luis, MD  Cardiologist:  Lorine Bears, MD  Electrophysiologist:  None   Chief Complaint: Follow-up visit  History of Present Illness:    Crystal Haas is a 73 y.o. female who presents via audio/video conferencing for a telehealth visit today.   She is followed for chronic atrial fibrillation and hypertension.  Echocardiogram in July, 2016 showed normal LV systolic function with mildly dilated right and left atrium.  She is tolerating anticoagulation with Eliquis.   She has known history of sleep apnea on CPAP. She had previous knee surgery. Losartan was added during last visit due to elevated blood pressure.  She was hospitalized in January with lower GI bleed.  Colonoscopy showed multiple diverticula with no active bleeding.  Eliquis was interrupted for few days and resumed with no issues.  She had labs done in January which were unremarkable.  CBC was normal. She has been doing well with no palpitations or chest pain.  The patient does not have symptoms concerning for COVID-19 infection (fever, chills, cough, or new shortness of breath).    Prior CV studies:   The following studies were reviewed today:    Past Medical History:   Diagnosis Date  . Anemia    distant past  . Anxiety   . Arthritis    "everywhere" - big toes worst  . Chronic atrial fibrillation    a. on eliquis; b. CHADS2VASc at least 2 (age x 1, female)  . Depression   . GERD (gastroesophageal reflux disease)    RARE  . Heart murmur    mild - followed by PCP  . Knee pain   . Motion sickness    back seat of car  . OSA on CPAP    CPAP-4 PSI  . Seasonal allergies    takes allergy weekly   Past Surgical History:  Procedure Laterality Date  . BREAST REDUCTION SURGERY Bilateral 08/29/2016   Procedure: BILATERAL MAMMARY REDUCTION  (BREAST)WITH LIPOSUCTION;  Surgeon: Peggye Form, DO;  Location: [facility];  Service: Plastics;  Laterality: Bilateral;  . CATARACT EXTRACTION W/PHACO Left 05/03/2015   Procedure: CATARACT EXTRACTION PHACO AND INTRAOCULAR LENS PLACEMENT (IOC);  Surgeon: Lockie Mola, MD;  Location[facility];  Service: Ophthalmology;  Laterality: Left;  CPAP  . CATARACT EXTRACTION W/PHACO Right 10/09/2016   Procedure: CATARACT EXTRACTION PHACO AND INTRAOCULAR LENS PLACEMENT (IOC);  Surgeon: Lockie Mola, MD;  Location: [facility];  Service: Ophthalmology;  Laterality: Right;  sleep apnea  . CHONDROPLASTY Right 04/29/2016   Procedure: CHONDROPLASTY;  Surgeon: Donato Heinz, MD;  Location: [facility];  Service: Orthopedics;  Laterality: Right;  . COLONOSCOPY WITH PROPOFOL N/A 10/24/2018   Procedure: COLONOSCOPY WITH PROPOFOL;  Surgeon: Maximino Greenland,  Dolphus Jenny, MD;  Location: [facility];  Service: Endoscopy;  Laterality: N/A;  . EYE SURGERY    . KNEE ARTHROPLASTY Right 03/25/2018   Procedure: COMPUTER ASSISTED TOTAL KNEE ARTHROPLASTY;  Surgeon: Donato Heinz, MD;  Location: [facility];  Service: Orthopedics;  Laterality: Right;  . KNEE ARTHROSCOPY WITH LATERAL MENISECTOMY  04/29/2016   Procedure: KNEE ARTHROSCOPY WITH LATERAL MENISECTOMY;  Surgeon: Donato Heinz, MD;  Location: [facility];   Service: Orthopedics;;  . KNEE ARTHROSCOPY WITH MEDIAL MENISECTOMY  04/29/2016   Procedure: KNEE ARTHROSCOPY WITH MEDIAL MENISECTOMY;  Surgeon: Donato Heinz, MD;  Location: [facility];  Service: Orthopedics;;  . REDUCTION MAMMAPLASTY Bilateral 09/2016  . RETINAL DETACHMENT SURGERY Left May 03, 2015   Dr. Inez Pilgrim, [facility]  . TUBAL LIGATION       Current Meds  Medication Sig  . atorvastatin (LIPITOR) 20 MG tablet TAKE 1 TABLET BY MOUTH ONCE DAILY  . B Complex-C (B-COMPLEX WITH VITAMIN C) tablet Take 1 tablet by mouth daily.  . cetirizine (ZYRTEC) 10 MG tablet Take 10 mg by mouth daily.  . Cholecalciferol (VITAMIN D3) 2000 units TABS Take 2,000 Units by mouth daily.  Marland Kitchen ELIQUIS 5 MG TABS tablet Take 1 tablet by mouth twice daily  . EPIPEN 2-PAK 0.3 MG/0.3ML SOAJ injection Inject 0.3 mg as directed as directed. AS NEEDED FOR ANAPHYLAXIS   . fluticasone (FLONASE) 50 MCG/ACT nasal spray Place 2 sprays into both nostrils daily.   Marland Kitchen GLUCOSAMINE-CHONDROITIN DS PO Take 3,000 mg by mouth daily.  Marland Kitchen losartan (COZAAR) 25 MG tablet Take 1 tablet (25 mg total) by mouth daily.  . montelukast (SINGULAIR) 10 MG tablet Take 10 mg by mouth at bedtime.     Allergies:   Apple; Daucus carota; Ivp dye [iodinated diagnostic agents]; Other; Strawberry (diagnostic); Strawberry extract; Iodine; and Tape   Social History   Tobacco Use  . Smoking status: Former Smoker    Packs/day: 0.25    Years: 2.00    Pack years: 0.50    Types: Cigarettes    Last attempt to quit: 10/21/1969    Years since quitting: 49.2  . Smokeless tobacco: Never Used  Substance Use Topics  . Alcohol use: Yes    Alcohol/week: 7.0 standard drinks    Types: 7 Shots of liquor per week    Comment: SOCIALLY  . Drug use: No     Family Hx: The patient's family history includes Alcoholism in an other family member; Heart disease in an other family member. There is no history of Breast cancer.  ROS:   Please see the history of present  illness.     All other systems reviewed and are negative.   Labs/Other Tests and Data Reviewed:    Recent Labs: 04/20/2018: TSH 2.63 10/22/2018: ALT 25; Magnesium 2.4 11/03/2018: BUN 15; Creatinine, Ser 0.94; Hemoglobin 13.4; Platelets 222; Potassium 4.0; Sodium 140   Recent Lipid Panel Lab Results  Component Value Date/Time   CHOL 147 10/24/2017 12:30 PM   CHOL 186 09/04/2015 10:51 AM   TRIG 95.0 10/24/2017 12:30 PM   HDL 59.60 10/24/2017 12:30 PM   HDL 66 09/04/2015 10:51 AM   CHOLHDL 2 10/24/2017 12:30 PM   LDLCALC 69 10/24/2017 12:30 PM   LDLCALC 99 09/04/2015 10:51 AM   LDLDIRECT 60.0 09/30/2016 11:29 AM    Wt Readings from Last 3 Encounters:  01/20/19 213 lb (96.6 kg)  11/17/18 223 lb 3.2 oz (101.2 kg)  11/03/18 223 lb 3.2 oz (101.2 kg)  Objective:    Vital Signs:  Pulse (!) 58   Ht 5\' 5"  (1.651 m)   Wt 213 lb (96.6 kg)   BMI 35.45 kg/m    Well nourished, well developed female in no acute distress.   ASSESSMENT & PLAN:    1.  Chronic atrial fibrillation: Ventricular rate is controlled without any medication.  She is tolerating anticoagulation with Eliquis.  She did have lower GI bleed in January but that has resolved since then with no recurrent episodes.  Colonoscopy showed diverticulosis which was the likely source.  2.  Essential hypertension: I encouraged her to get a home blood pressure machine.  3.  Knee arthritis: She is still waiting to have knee replacement.   COVID-19 Education: The signs and symptoms of COVID-19 were discussed with the patient and how to seek care for testing (follow up with PCP or arrange E-visit).  The importance of social distancing was discussed today.  Patient Risk:   After full review of this patient's clinical status, I feel that they are at least moderate risk at this time.  Time:   Today, I have spent 20 minutes with the patient with telehealth technology discussing above issues.     Medication Adjustments/Labs and  Tests Ordered: Current medicines are reviewed at length with the patient today.  Concerns regarding medicines are outlined above.  Tests Ordered: No orders of the defined types were placed in this encounter.  Medication Changes: No orders of the defined types were placed in this encounter.   Disposition:  Follow up in 6 month(s)  Signed, Lorine Bears, MD  01/20/2019 2:55 PM    [facility]

## 2019-01-22 ENCOUNTER — Telehealth: Payer: Self-pay | Admitting: Family Medicine

## 2019-01-22 DIAGNOSIS — F32A Depression, unspecified: Secondary | ICD-10-CM

## 2019-01-22 DIAGNOSIS — F329 Major depressive disorder, single episode, unspecified: Secondary | ICD-10-CM

## 2019-01-22 MED ORDER — MONTELUKAST SODIUM 10 MG PO TABS: 10.0000 mg | ORAL_TABLET | Freq: Every day | ORAL | 0 refills | Status: DC

## 2019-01-22 MED ORDER — CETIRIZINE HCL 10 MG PO TABS: 10.0000 mg | ORAL_TABLET | Freq: Every day | ORAL | 0 refills | Status: DC

## 2019-01-22 MED ORDER — EPIPEN 2-PAK 0.3 MG/0.3ML IJ SOAJ: 0.3000 mg | INTRAMUSCULAR | 0 refills | Status: DC

## 2019-01-22 MED ORDER — FLUTICASONE PROPIONATE 50 MCG/ACT NA SUSP: 2.0000 | Freq: Every day | NASAL | 0 refills | Status: DC

## 2019-01-22 MED ORDER — PAROXETINE HCL 20 MG PO TABS: 20.0000 mg | ORAL_TABLET | Freq: Every day | ORAL | 0 refills | Status: DC

## 2019-01-22 MED ORDER — ATORVASTATIN CALCIUM 20 MG PO TABS: ORAL_TABLET | ORAL | 0 refills | Status: DC

## 2019-01-22 MED ORDER — ZOLPIDEM TARTRATE 5 MG PO TABS: 5.0000 mg | ORAL_TABLET | Freq: Every evening | ORAL | 0 refills | Status: DC | PRN

## 2019-01-22 NOTE — Telephone Encounter (Signed)
Copied from CRM [id number]. Topic: Quick Communication - Rx Refill/Question >> Jan 22, 2019 11:28 AM Jay Schlichter wrote: Medication:PARoxetine (PAXIL) 20 MG tablet,  montelukast (SINGULAIR) 10 MG tablet, fluticasone (FLONASE) 50 MCG/ACT nasal sp, EPIPEN 2-PAK 0.3 MG/0.3ML SOAJ injection, cetirizine (ZYRTEC) 10 MG tablet, atorvastatin (LIPITOR) 20 MG tablet zolpidem (AMBIEN) 5 MG tablet  Has the patient contacted their pharmacy? yes (Agent: If yes, when and what did the pharmacy advise?)  Preferred Pharmacy (with phone number or street name):[facility] Make sure all meds go to [facility]   Agent: Please be advised that RX refills may take up to 3 business days. We ask that you follow-up with your pharmacy.

## 2019-01-22 NOTE — Telephone Encounter (Signed)
Sent to pharmacy.  Ambien sent to pharmacy to be refilled on May 17.  This is 90 days after her last refill.

## 2019-01-22 NOTE — Telephone Encounter (Signed)
I was able to refill all medication except for epi pen and ambein sent to PCP to advise   Last OV 11/03/2018  Last refilled 12/07/2017 disp 90 with no refills

## 2019-01-25 NOTE — Telephone Encounter (Signed)
Called pt and left a VM that all Rx's have been refilled as requested.

## 2019-02-10 DIAGNOSIS — G4733 Obstructive sleep apnea (adult) (pediatric): Secondary | ICD-10-CM | POA: Diagnosis not present

## 2019-02-26 DIAGNOSIS — J301 Allergic rhinitis due to pollen: Secondary | ICD-10-CM | POA: Diagnosis not present

## 2019-03-05 DIAGNOSIS — J301 Allergic rhinitis due to pollen: Secondary | ICD-10-CM | POA: Diagnosis not present

## 2019-03-12 ENCOUNTER — Telehealth: Payer: Self-pay | Admitting: Family Medicine

## 2019-03-12 DIAGNOSIS — J301 Allergic rhinitis due to pollen: Secondary | ICD-10-CM | POA: Diagnosis not present

## 2019-03-12 NOTE — Telephone Encounter (Signed)
Pt called back to cancel request. She states refill is no longer needed, she is refilling via [facility].

## 2019-03-12 NOTE — Telephone Encounter (Signed)
Patient is calling in stating she is actually in need, again, of the medicine being refilled for 90 days.

## 2019-03-12 NOTE — Telephone Encounter (Unsigned)
Copied from CRM [id number]. Topic: General - Other >> Mar 12, 2019 11:11 AM Gwenlyn Fudge A wrote: Reason for CRM: Pt called and is requesting to have her medication,zolpidem (AMBIEN) 5 MG tablet, refilled but would like the does to be changed to 6.25 so that she can get cheaper with her good rx card. Pt states at the pharmacy she has been going to they have been charging her $100 to get her medication refilled. Please contact pt with more information.  [facility] [place], [place] - [address] [place] [address] Phone: [phone] Fax: [phone] Not a 24 hour pharmacy; exact hours not known.

## 2019-03-16 MED ORDER — ZOLPIDEM TARTRATE 5 MG PO TABS: 5.0000 mg | ORAL_TABLET | Freq: Every evening | ORAL | 0 refills | Status: DC | PRN

## 2019-03-16 NOTE — Telephone Encounter (Signed)
Sent to pharmacy 

## 2019-03-17 DIAGNOSIS — J301 Allergic rhinitis due to pollen: Secondary | ICD-10-CM | POA: Diagnosis not present

## 2019-03-19 DIAGNOSIS — J301 Allergic rhinitis due to pollen: Secondary | ICD-10-CM | POA: Diagnosis not present

## 2019-03-26 DIAGNOSIS — J301 Allergic rhinitis due to pollen: Secondary | ICD-10-CM | POA: Diagnosis not present

## 2019-03-29 ENCOUNTER — Telehealth: Payer: Self-pay | Admitting: Family Medicine

## 2019-03-29 NOTE — Telephone Encounter (Signed)
Copied from [place] [id number]. Topic: Quick Communication - Rx Refill/Question >> Mar 29, 2019  1:01 PM Burchel, Abbi R wrote: Medication: zolpidem (AMBIEN) 5 MG tablet   Preferred [facility] - [place], [place] [phone] (Phone) [phone] (Fax)    Pt was advised that RX refills may take up to 3 business days. We ask that you follow-up with your pharmacy.

## 2019-03-31 NOTE — Telephone Encounter (Signed)
It appears her prior refill was sent to [facility].  Please confirm that the patient wants this sent to the mail order and if so I can do that though we will need to contact [facility] to cancel that Ambien prescription.  Thanks.

## 2019-03-31 NOTE — Telephone Encounter (Signed)
Pt requested refill on Ambien.  Already had a refill on 03/16/19 for 90 tabs.

## 2019-04-02 ENCOUNTER — Telehealth: Payer: Self-pay

## 2019-04-02 DIAGNOSIS — J301 Allergic rhinitis due to pollen: Secondary | ICD-10-CM | POA: Diagnosis not present

## 2019-04-02 NOTE — Telephone Encounter (Signed)
A call from [facility] came to me and she was asking about a request for a refill of Ambien for the pt, she states it is the 2nd request.  I looked in the chart and discovered the medication was refilled for 90 days in May but it was sent to [facility].  I called the pt and she states it is suppose to go to [place], I then called [facility] and the RX was cancelled and never filled, I called [facility] back and gave them the medication refill that was from may, verbally.  Nina,cma

## 2019-04-09 DIAGNOSIS — J301 Allergic rhinitis due to pollen: Secondary | ICD-10-CM | POA: Diagnosis not present

## 2019-04-12 ENCOUNTER — Other Ambulatory Visit: Payer: Self-pay | Admitting: Cardiovascular Disease

## 2019-04-12 MED ORDER — APIXABAN 5 MG PO TABS: 5.0000 mg | ORAL_TABLET | Freq: Two times a day (BID) | ORAL | 1 refills | Status: DC

## 2019-04-12 MED ORDER — ATORVASTATIN CALCIUM 20 MG PO TABS: ORAL_TABLET | ORAL | 3 refills | Status: DC

## 2019-04-12 NOTE — Telephone Encounter (Signed)
Pt's wt 96.6 kg, age 73, SCr 0.94, CrCl 83.[place]

## 2019-04-12 NOTE — Telephone Encounter (Signed)
*  STAT* If patient is at the pharmacy, call can be transferred to refill team.   1. Which medications need to be refilled? (please list name of each medication and dose if known) Atorvastatin and Eliquis  2. Which pharmacy/location (including street and city if local pharmacy) is medication to be sent to? [facility]  3. Do they need a 30 day or 90 day supply? 90 Patient is out of Lipitor

## 2019-04-12 NOTE — Telephone Encounter (Signed)
Please review for refill on Eliquis  

## 2019-04-12 NOTE — Addendum Note (Signed)
Addended by: Anselm Pancoast on: 04/12/2019 03:32 PM   Modules accepted: Orders

## 2019-04-15 DIAGNOSIS — G4733 Obstructive sleep apnea (adult) (pediatric): Secondary | ICD-10-CM | POA: Diagnosis not present

## 2019-04-15 DIAGNOSIS — J301 Allergic rhinitis due to pollen: Secondary | ICD-10-CM | POA: Diagnosis not present

## 2019-04-16 ENCOUNTER — Telehealth: Payer: Self-pay | Admitting: Cardiovascular Disease

## 2019-04-16 NOTE — Telephone Encounter (Signed)
Patient has mail order for her Eliquis and has not received her prescription yet. Patient is curious if she will be okay or not without taking it for a few days.   Please Advise

## 2019-04-16 NOTE — Telephone Encounter (Signed)
Spoke with the pt. Pt sts that she has taken her last dose of Eliquis last night and she is currently out of her medication. She  is awaiting her mail order prescription. Adv her that we do not recommend interruption in therapy if not necessary. Adv the pt that we could supply her with Eliquis samples for 1-2 weeks to carry her over until her mail order  Is received.  Adv pt that samples of Eliquis will be left at our office front desk for her to pick up today. Adv her that she will need to enter through the medical mall and is required to wear a face mask.  Pt verbalized understanding and voiced appreciation for the assistance.

## 2019-04-20 DIAGNOSIS — J301 Allergic rhinitis due to pollen: Secondary | ICD-10-CM | POA: Diagnosis not present

## 2019-04-26 DIAGNOSIS — H40003 Preglaucoma, unspecified, bilateral: Secondary | ICD-10-CM | POA: Diagnosis not present

## 2019-04-27 DIAGNOSIS — J301 Allergic rhinitis due to pollen: Secondary | ICD-10-CM | POA: Diagnosis not present

## 2019-05-03 DIAGNOSIS — H40003 Preglaucoma, unspecified, bilateral: Secondary | ICD-10-CM | POA: Diagnosis not present

## 2019-05-04 DIAGNOSIS — J301 Allergic rhinitis due to pollen: Secondary | ICD-10-CM | POA: Diagnosis not present

## 2019-05-11 DIAGNOSIS — J301 Allergic rhinitis due to pollen: Secondary | ICD-10-CM | POA: Diagnosis not present

## 2019-05-18 DIAGNOSIS — J301 Allergic rhinitis due to pollen: Secondary | ICD-10-CM | POA: Diagnosis not present

## 2019-05-20 DIAGNOSIS — G4733 Obstructive sleep apnea (adult) (pediatric): Secondary | ICD-10-CM | POA: Diagnosis not present

## 2019-05-21 ENCOUNTER — Other Ambulatory Visit: Payer: Self-pay

## 2019-05-21 MED ORDER — LOSARTAN POTASSIUM 25 MG PO TABS: 25.0000 mg | ORAL_TABLET | Freq: Every day | ORAL | 3 refills | Status: DC

## 2019-05-25 DIAGNOSIS — J301 Allergic rhinitis due to pollen: Secondary | ICD-10-CM | POA: Diagnosis not present

## 2019-06-01 DIAGNOSIS — J301 Allergic rhinitis due to pollen: Secondary | ICD-10-CM | POA: Diagnosis not present

## 2019-06-01 DIAGNOSIS — G4733 Obstructive sleep apnea (adult) (pediatric): Secondary | ICD-10-CM | POA: Diagnosis not present

## 2019-06-08 DIAGNOSIS — J301 Allergic rhinitis due to pollen: Secondary | ICD-10-CM | POA: Diagnosis not present

## 2019-06-21 ENCOUNTER — Other Ambulatory Visit: Payer: Self-pay | Admitting: Family Medicine

## 2019-06-21 DIAGNOSIS — F329 Major depressive disorder, single episode, unspecified: Secondary | ICD-10-CM

## 2019-06-21 DIAGNOSIS — F32A Depression, unspecified: Secondary | ICD-10-CM

## 2019-06-21 NOTE — Telephone Encounter (Signed)
Requested medication (s) are due for refill today: yes  Requested medication (s) are on the active medication list: yes  Last refill:    Future visit scheduled: no  Notes to clinic:  Review for refill  Requested Prescriptions  Pending Prescriptions Disp Refills   PARoxetine (PAXIL) 20 MG tablet 90 tablet 0    Sig: Take 1 tablet (20 mg total) by mouth daily.     Psychiatry:  Antidepressants - SSRI Failed - 06/21/2019 11:37 AM      Failed - Valid encounter within last 6 months    Recent Outpatient Visits          7 months ago Unexplained night sweats   [place], Angela Adam, MD   1 year ago Night sweats   [place], MD   1 year ago Primary osteoarthritis of right knee   [place], [place], DO   1 year ago Neuropathy   [facility] [place] Leone [place], MD   1 year ago Encounter for immunization   [place], [place], DO             Passed - Completed PHQ-2 or PHQ-9 in the last 360 days.

## 2019-06-21 NOTE — Telephone Encounter (Signed)
Medication Refill - Medication:  PARoxetine (PAXIL) 20 MG tablet   Preferred Pharmacy (with phone number or street name):  [place], [place] [phone] (Phone) [phone] (Fax)

## 2019-06-22 DIAGNOSIS — J301 Allergic rhinitis due to pollen: Secondary | ICD-10-CM | POA: Diagnosis not present

## 2019-06-25 MED ORDER — PAROXETINE HCL 20 MG PO TABS: 20.0000 mg | ORAL_TABLET | Freq: Every day | ORAL | 1 refills | Status: DC

## 2019-06-29 DIAGNOSIS — J301 Allergic rhinitis due to pollen: Secondary | ICD-10-CM | POA: Diagnosis not present

## 2019-07-06 ENCOUNTER — Other Ambulatory Visit: Payer: Self-pay | Admitting: Family Medicine

## 2019-07-06 DIAGNOSIS — J301 Allergic rhinitis due to pollen: Secondary | ICD-10-CM | POA: Diagnosis not present

## 2019-07-06 MED ORDER — ZOLPIDEM TARTRATE 5 MG PO TABS: 5.0000 mg | ORAL_TABLET | Freq: Every evening | ORAL | 0 refills | Status: DC | PRN

## 2019-07-06 NOTE — Telephone Encounter (Signed)
Patient has an appointment scheduled later this month.  I sent in a refill to cover her until she is able to come in for that visit.  She needs to keep that visit to receive further refills of this medication.

## 2019-07-06 NOTE — Telephone Encounter (Signed)
zolpidem (AMBIEN) 5 MG tablet  [place], [place] [phone] (Phone) [phone] (Fax)   Pt wants a refill but pt has not seen Dr Chauncey Cruel since January. Advised pt an appt may be needed and am also making an appt.

## 2019-07-06 NOTE — Telephone Encounter (Signed)
Requested medication (s) are due for refill today: yes  Requested medication (s) are on the active medication list: yes  Last refill:  03/16/2019  Future visit scheduled: no  Notes to clinic:  Refill cannot be delegated   Requested Prescriptions  Pending Prescriptions Disp Refills   zolpidem (AMBIEN) 5 MG tablet 90 tablet 0    Sig: Take 1 tablet (5 mg total) by mouth at bedtime as needed. for sleep     Not Delegated - Psychiatry:  Anxiolytics/Hypnotics Failed - 07/06/2019  9:29 AM      Failed - This refill cannot be delegated      Failed - Urine Drug Screen completed in last 360 days.      Failed - Valid encounter within last 6 months    Recent Outpatient Visits          8 months ago Unexplained night sweats   [place], Angela Adam, MD   1 year ago Night sweats   [place] Leone Haven, MD   1 year ago Primary osteoarthritis of right knee   [place], Alvan, DO   1 year ago Neuropathy   [facility] [place], Angela Adam, MD   1 year ago Encounter for immunization   [place], [place], DO      Future Appointments            In 1 week Caryl Bis, Angela Adam, MD [facility], [facility]

## 2019-07-19 ENCOUNTER — Ambulatory Visit: Payer: Medicare HMO | Admitting: Family Medicine

## 2019-07-20 DIAGNOSIS — J301 Allergic rhinitis due to pollen: Secondary | ICD-10-CM | POA: Diagnosis not present

## 2019-07-22 ENCOUNTER — Other Ambulatory Visit: Payer: Self-pay

## 2019-07-26 ENCOUNTER — Encounter: Payer: Self-pay | Admitting: Family Medicine

## 2019-07-26 ENCOUNTER — Other Ambulatory Visit: Payer: Self-pay

## 2019-07-26 ENCOUNTER — Ambulatory Visit (INDEPENDENT_AMBULATORY_CARE_PROVIDER_SITE_OTHER): Payer: Medicare HMO | Admitting: Family Medicine

## 2019-07-26 VITALS — BP 134/66 | HR 78 | Temp 98.6°F | Wt 236.2 lb

## 2019-07-26 DIAGNOSIS — G4730 Sleep apnea, unspecified: Secondary | ICD-10-CM

## 2019-07-26 DIAGNOSIS — Z23 Encounter for immunization: Secondary | ICD-10-CM | POA: Diagnosis not present

## 2019-07-26 DIAGNOSIS — R61 Generalized hyperhidrosis: Secondary | ICD-10-CM | POA: Diagnosis not present

## 2019-07-26 DIAGNOSIS — G4733 Obstructive sleep apnea (adult) (pediatric): Secondary | ICD-10-CM | POA: Diagnosis not present

## 2019-07-26 DIAGNOSIS — M25571 Pain in right ankle and joints of right foot: Secondary | ICD-10-CM | POA: Diagnosis not present

## 2019-07-26 DIAGNOSIS — L989 Disorder of the skin and subcutaneous tissue, unspecified: Secondary | ICD-10-CM

## 2019-07-26 DIAGNOSIS — R7303 Prediabetes: Secondary | ICD-10-CM | POA: Insufficient documentation

## 2019-07-26 DIAGNOSIS — F5104 Psychophysiologic insomnia: Secondary | ICD-10-CM | POA: Diagnosis not present

## 2019-07-26 DIAGNOSIS — I10 Essential (primary) hypertension: Secondary | ICD-10-CM | POA: Diagnosis not present

## 2019-07-26 NOTE — Progress Notes (Signed)
Tommi Rumps, MD Phone: [phone]  Crystal Haas is a 73 y.o. female who presents today for follow-up.  Insomnia: She ran out of her Ambien about 6 weeks ago.  The first couple of weeks were very difficult though now that she is off of it she does sleep somewhat better than she did initially and would like to only take this if needed rather than nightly.  No drowsiness with the Ambien.  She did sleep quite a bit better when she was taking it.  Skin lesion: Patient notes a dry patch above her left eyebrow and a lesion in her scalp that have been persistent.  OSA: Patient is getting a new CPAP and a new mask that she will try to see if she tolerates better.  It takes her about 2 hours to fall asleep and then she gets up to go the bathroom every couple of hours.  When she gets to about 6 AM she will sleep 4 to 5 hours that is good solid sleep and she will wake up well rested.  Hypertension: Notes her blood pressures typically similar to what it is in the office today.  She notes no chest pain or shortness of breath.  She remains on losartan.  Right ankle pain: Patient notes she has been hurting in the lateral aspect of her right ankle recently.  There has been mild swelling.  She notes no injury.  This has been going on for about a week.  Obesity: Notes she did lose some weight as she was being more active and eating healthier with a mostly vegetarian diet.  She notes she did travel some and spends time with family where they do not eat as healthily.  Night sweats: She continues to have some issues with these typically around 5:30 in the morning.  They are getting less frequent as she did buy a cooling blanket.  She is on Paxil.  Prior lab work-up was unremarkable for cause.  Social History   Tobacco Use  Smoking Status Former Smoker  . Packs/day: 0.25  . Years: 2.00  . Pack years: 0.50  . Types: Cigarettes  . Quit date: 10/21/1969  . Years since quitting: 49.7  Smokeless Tobacco Never  Used     ROS see history of present illness  Objective  Physical Exam Vitals:   07/26/19 1535  BP: 134/66  Pulse: 78  Temp: 98.6 F (37 C)  SpO2: 96%    BP Readings from Last 3 Encounters:  07/26/19 134/66  11/17/18 (!) 180/83  11/03/18 124/78   Wt Readings from Last 3 Encounters:  07/26/19 236 lb 3.2 oz (107.1 kg)  01/20/19 213 lb (96.6 kg)  11/17/18 223 lb 3.2 oz (101.2 kg)    Physical Exam Constitutional:      General: She is not in acute distress.    Appearance: She is not diaphoretic.  Cardiovascular:     Rate and Rhythm: Normal rate and regular rhythm.     Heart sounds: Normal heart sounds.  Pulmonary:     Effort: Pulmonary effort is normal.     Breath sounds: Normal breath sounds.  Musculoskeletal:     Comments: Tender to palpation over the lateral malleolus of the right ankle, there is associated swelling in that area, no overlying erythema  Skin:    General: Skin is warm and dry.     Comments: Dry patch over her left eyebrow, rough scalp lesion noted near the vertex of her head  Neurological:  Mental Status: She is alert.      Assessment/Plan: Please see individual problem list.  Hypertension Adequately controlled.  Continue current regimen.  Sleep apnea She will trial her new CPAP.  She will continue to see pulmonology.  Skin lesion Refer to dermatology.  Acute right ankle pain X-ray ordered.  She will have this at the medical mall at the hospital.  Chronic insomnia Continue with Ambien as needed.  Monitor for drowsiness.  Night sweats This continues to be an issue.  Her Paxil could be contributing.  We will check with our clinical pharmacist regarding tapering off of this and getting her onto a new SSRI.  Will recheck labs.  Consider imaging if lab work is unremarkable and if changing from Paxil is not beneficial.  Morbid obesity ([place]) Discussed increasing activity and getting back to monitoring her diet.   Orders Placed This  Encounter  Procedures  . DG Ankle Complete Right    Standing Status:   Future    Standing Expiration Date:   09/24/2020    Order Specific Question:   Reason for Exam (SYMPTOM  OR DIAGNOSIS REQUIRED)    Answer:   right ankle pain over the lateral maleolus, tender to palpation, no injury    Order Specific Question:   Preferred imaging location?    Answer:   [facility]    Order Specific Question:   Radiology Contrast Protocol - do NOT remove file path    Answer:   _0 [email or web address]  . Flu Vaccine QUAD High Dose(Fluad)  . Comp Met (CMET)  . TSH  . CBC w/Diff  . Sedimentation rate  . HgB A1c  . Ambulatory referral to Dermatology    Referral Priority:   Routine    Referral Type:   Consultation    Referral Reason:   Specialty Services Required    Requested Specialty:   Dermatology    Number of Visits Requested:   1    Meds ordered this encounter  Medications  . zolpidem (AMBIEN) 5 MG tablet    Sig: Take 1 tablet (5 mg total) by mouth at bedtime as needed for sleep. Please keep scheduled appointment to get further refills.    Dispense:  30 tablet    Refill:  0     Tommi Rumps, MD [place]

## 2019-07-26 NOTE — Patient Instructions (Signed)
Nice to see you. We will get lab work today and contact you with the results. Please see how you do with your new CPAP. We will get you to see dermatology as well. Please go to the hospital radiology department for your x-ray.

## 2019-07-27 ENCOUNTER — Other Ambulatory Visit: Payer: Self-pay | Admitting: Family Medicine

## 2019-07-27 ENCOUNTER — Other Ambulatory Visit (INDEPENDENT_AMBULATORY_CARE_PROVIDER_SITE_OTHER): Payer: Medicare HMO

## 2019-07-27 ENCOUNTER — Other Ambulatory Visit: Payer: Self-pay

## 2019-07-27 DIAGNOSIS — I482 Chronic atrial fibrillation, unspecified: Secondary | ICD-10-CM

## 2019-07-27 DIAGNOSIS — J301 Allergic rhinitis due to pollen: Secondary | ICD-10-CM | POA: Diagnosis not present

## 2019-07-27 DIAGNOSIS — Z5181 Encounter for therapeutic drug level monitoring: Secondary | ICD-10-CM | POA: Diagnosis not present

## 2019-07-27 DIAGNOSIS — D72821 Monocytosis (symptomatic): Secondary | ICD-10-CM

## 2019-07-27 DIAGNOSIS — M25571 Pain in right ankle and joints of right foot: Secondary | ICD-10-CM | POA: Insufficient documentation

## 2019-07-27 LAB — COMPREHENSIVE METABOLIC PANEL
ALT: 17 U/L (ref 0–35)
AST: 17 U/L (ref 0–37)
Albumin: 4.2 g/dL (ref 3.5–5.2)
Alkaline Phosphatase: 54 U/L (ref 39–117)
BUN: 19 mg/dL (ref 6–23)
CO2: 28 mEq/L (ref 19–32)
Calcium: 9.3 mg/dL (ref 8.4–10.5)
Chloride: 106 mEq/L (ref 96–112)
Creatinine, Ser: 0.87 mg/dL (ref 0.40–1.20)
GFR: 64 mL/min (ref >60.00)
Glucose, Bld: 80 mg/dL (ref 70–99)
Potassium: 4.3 mEq/L (ref 3.5–5.1)
Sodium: 141 mEq/L (ref 135–145)
Total Bilirubin: 0.5 mg/dL (ref 0.2–1.2)
Total Protein: 6.5 g/dL (ref 6.0–8.3)

## 2019-07-27 LAB — CBC WITH DIFFERENTIAL/PLATELET
Basophils Absolute: 0.1 10*3/uL (ref 0.0–0.1)
Basophils Relative: 1.3 % (ref 0.0–3.0)
Eosinophils Absolute: 0.3 10*3/uL (ref 0.0–0.7)
Eosinophils Relative: 3.7 % (ref 0.0–5.0)
HCT: 40.4 % (ref 36.0–46.0)
Hemoglobin: 13.2 g/dL (ref 12.0–15.0)
Lymphocytes Relative: 18.7 % (ref 12.0–46.0)
Lymphs Abs: 1.7 10*3/uL (ref 0.7–4.0)
MCHC: 32.7 g/dL (ref 30.0–36.0)
MCV: 92.9 fl (ref 78.0–100.0)
Monocytes Absolute: 1.6 10*3/uL — ABNORMAL HIGH (ref 0.1–1.0)
Monocytes Relative: 17.7 % — ABNORMAL HIGH (ref 3.0–12.0)
Neutro Abs: 5.4 10*3/uL (ref 1.4–7.7)
Neutrophils Relative %: 58.6 % (ref 43.0–77.0)
Platelets: 201 10*3/uL (ref 150.0–400.0)
RBC: 4.34 Mil/uL (ref 3.87–5.11)
RDW: 14.4 % (ref 11.5–15.5)
WBC: 9.2 10*3/uL (ref 4.0–10.5)

## 2019-07-27 LAB — HEMOGLOBIN A1C: Hgb A1c MFr Bld: 6 % (ref 4.6–6.5)

## 2019-07-27 LAB — TSH: TSH: 2.96 u[IU]/mL (ref 0.35–4.50)

## 2019-07-27 LAB — SEDIMENTATION RATE: Sed Rate: 16 mm/hr (ref 0–30)

## 2019-07-27 MED ORDER — ZOLPIDEM TARTRATE 5 MG PO TABS: 5.0000 mg | ORAL_TABLET | Freq: Every evening | ORAL | 0 refills | Status: DC | PRN

## 2019-07-27 NOTE — Assessment & Plan Note (Signed)
Discussed increasing activity and getting back to monitoring her diet.

## 2019-07-27 NOTE — Assessment & Plan Note (Signed)
Continue with Ambien as needed.  Monitor for drowsiness.

## 2019-07-27 NOTE — Assessment & Plan Note (Signed)
This continues to be an issue.  Her Paxil could be contributing.  We will check with our clinical pharmacist regarding tapering off of this and getting her onto a new SSRI.  Will recheck labs.  Consider imaging if lab work is unremarkable and if changing from Paxil is not beneficial.

## 2019-07-27 NOTE — Assessment & Plan Note (Signed)
She will trial her new CPAP.  She will continue to see pulmonology.

## 2019-07-27 NOTE — Assessment & Plan Note (Signed)
X-ray ordered.  She will have this at the medical mall at the hospital.

## 2019-07-27 NOTE — Assessment & Plan Note (Signed)
Refer to dermatology 

## 2019-07-27 NOTE — Assessment & Plan Note (Signed)
Adequately controlled.  Continue current regimen. 

## 2019-07-28 DIAGNOSIS — G4733 Obstructive sleep apnea (adult) (pediatric): Secondary | ICD-10-CM | POA: Diagnosis not present

## 2019-07-28 LAB — URINALYSIS, ROUTINE W REFLEX MICROSCOPIC
Bilirubin Urine: NEGATIVE
Hgb urine dipstick: NEGATIVE
Ketones, ur: NEGATIVE
Leukocytes,Ua: NEGATIVE
Nitrite: NEGATIVE
RBC / HPF: NONE SEEN (ref 0–?)
Specific Gravity, Urine: <=1.005 — AB (ref 1.000–1.030)
Total Protein, Urine: NEGATIVE
Urine Glucose: NEGATIVE
Urobilinogen, UA: 0.2 (ref 0.0–1.0)
pH: 5.5 (ref 5.0–8.0)

## 2019-07-29 ENCOUNTER — Telehealth: Payer: Self-pay | Admitting: Family Medicine

## 2019-07-29 MED ORDER — SERTRALINE HCL 50 MG PO TABS: ORAL_TABLET | ORAL | 1 refills | Status: DC

## 2019-07-29 NOTE — Telephone Encounter (Signed)
-----   Message from De Hollingshead, [facility] sent at 07/27/2019  4:30 PM EDT ----- Agreed w/ the switch.   I'd decrease paroxetine to 10 mg daily while starting sertraline 25 mg daily x 1 week, then stop paroxetine and increase sertraline to 50 mg daily.   Crystal Haas ----- Message ----- From: Leone Haven, MD Sent: 07/27/2019  12:30 PM EDT To: De Hollingshead, [facility]  Hey Crystal Haas,   This patient has been on paxil for many years. She has been having sweats at night over the past year and I wonder if the paxil could be contributing. I would like to taper her off the paxil and transition her to zoloft. What would be the best way to do this? Thanks.  Randall Hiss

## 2019-07-29 NOTE — Addendum Note (Signed)
Addended by: Leone Haven on: 07/29/2019 06:50 PM   Modules accepted: Orders

## 2019-07-29 NOTE — Telephone Encounter (Signed)
Zoloft sent to pharmacy

## 2019-07-29 NOTE — Telephone Encounter (Signed)
Please let the patient know that I heard back from the pharmacist. She advised decreasing the paxil to 10 mg daily for one week and starting on zoloft 25 mg daily at the same time. After a week she would stop the paxil and increase the zoloft to 50 mg. I can send the new medication in for her once you advise her of this.

## 2019-07-29 NOTE — Telephone Encounter (Signed)
I called and spoke with the patient and informed her to taper off the Paxil she is to take 10 mg daily for one week and start on the Zoloft 25 mg daily at the same time and after 1 week she should stop the Paxil and increase the Zoloft to 50 mg. Pt. Understood. She would like for you to send it to [place] because the mail order would take to long.  Crystal Haas,cma

## 2019-07-30 IMAGING — MG MM DIGITAL SCREENING BILAT W/ CAD
5 series · 5 of 5 positions shown · non-contrast
Comparison: Previous exam(s).

CLINICAL DATA: Screening.

EXAM:
DIGITAL SCREENING BILATERAL MAMMOGRAM WITH CAD

[R CC]
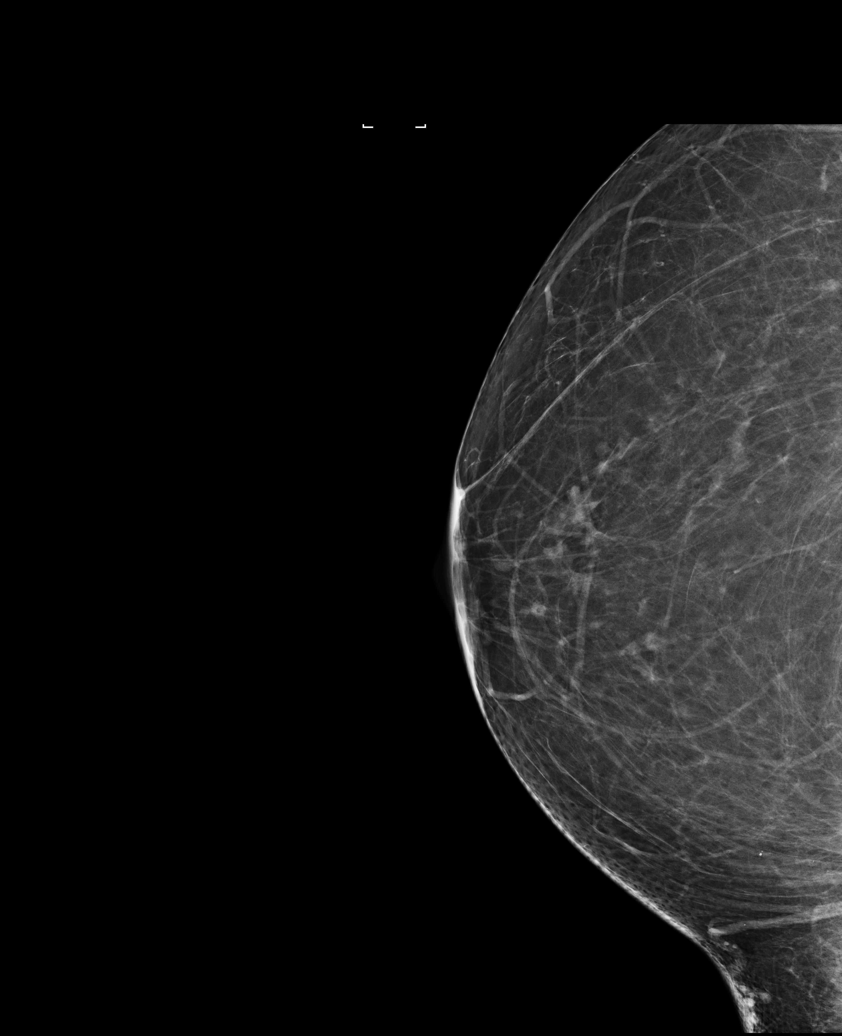

[L MLO]
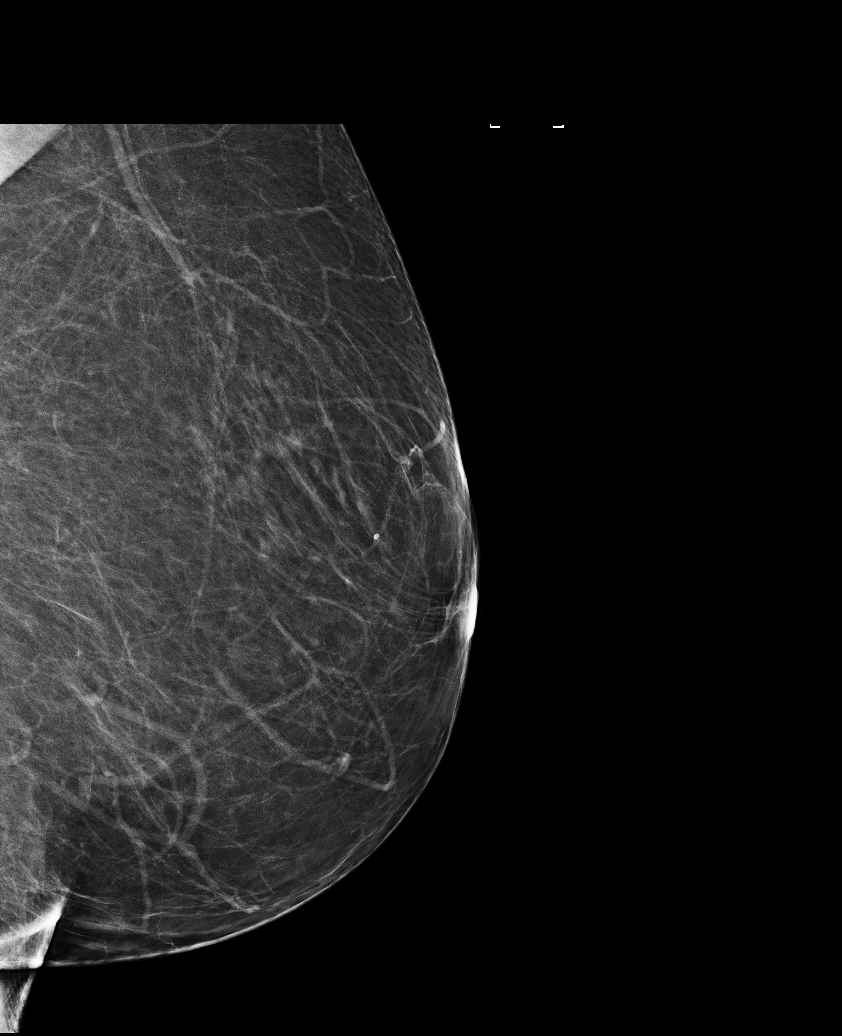

[R MLO (1 of 2)]
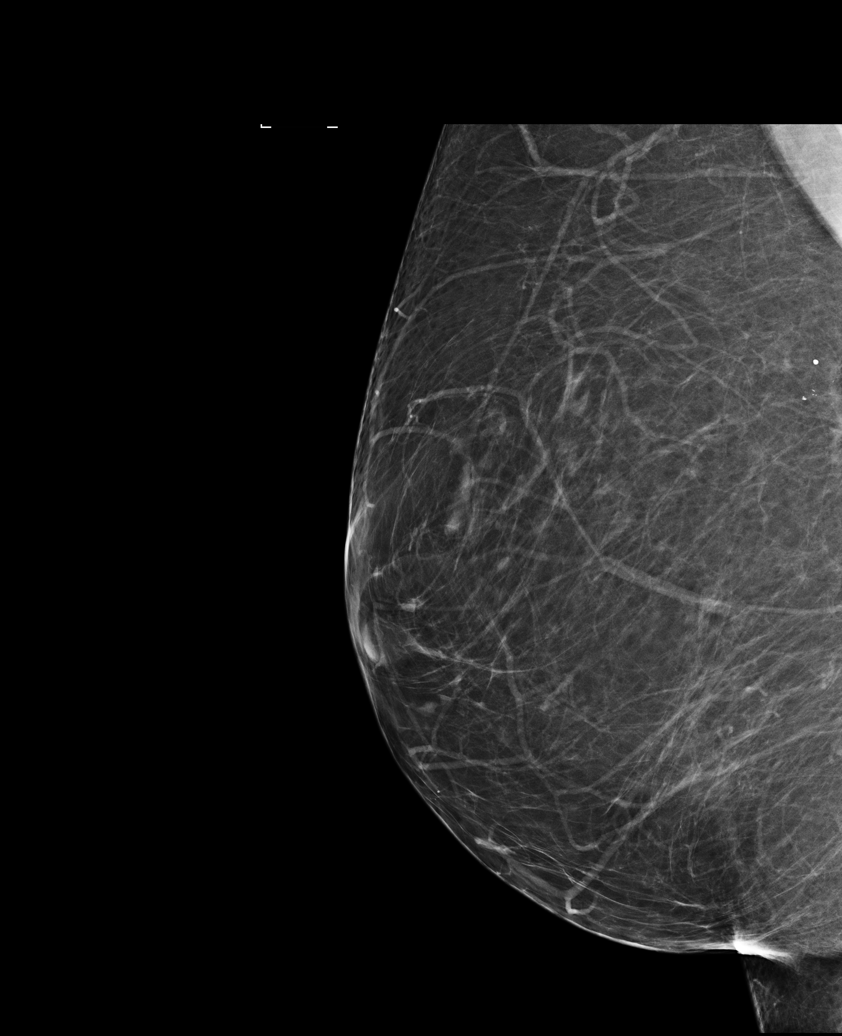

[L CC]
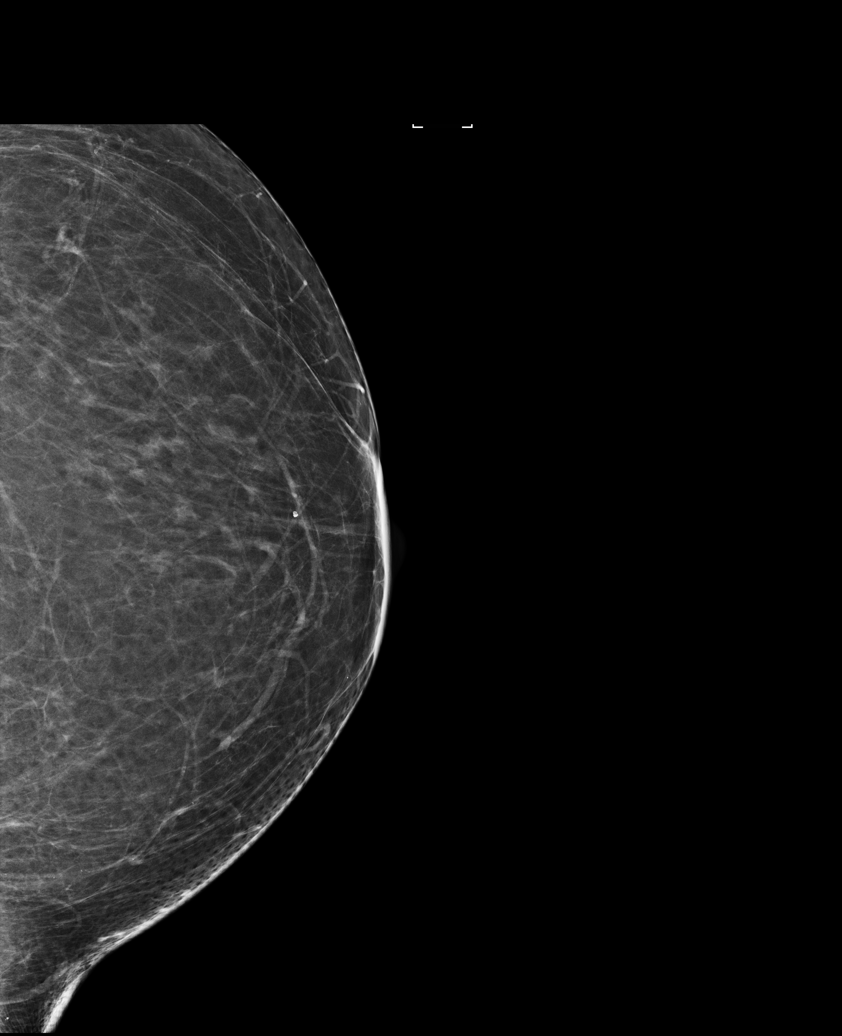

[R MLO (2 of 2)]
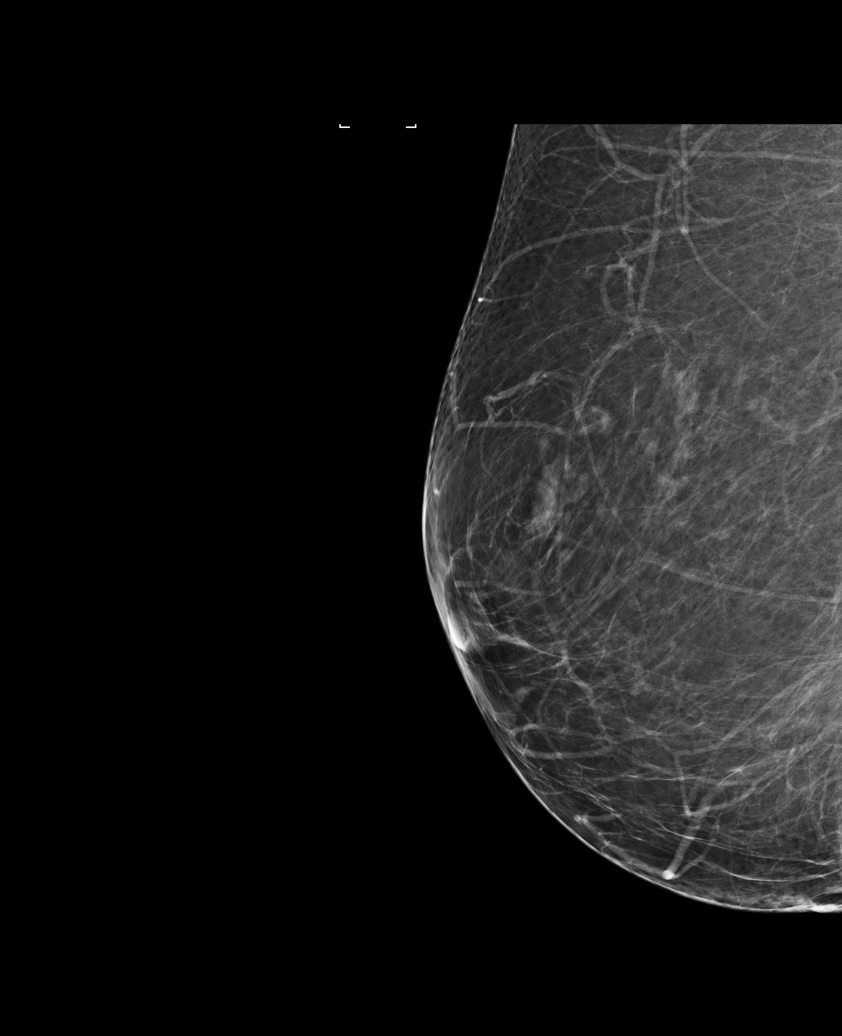

[5 of 5 positions shown; findings below may reference images not displayed]

ACR Breast Density Category b: There are scattered areas of
fibroglandular density.
FINDINGS: There are no findings suspicious for malignancy. Images were
processed with CAD.
IMPRESSION: No mammographic evidence of malignancy. A result letter of this
screening mammogram will be mailed directly to the patient.

RECOMMENDATION:
Screening mammogram in one year. (Code:AS-G-LCT)

BI-RADS CATEGORY  1: Negative.

## 2019-08-02 ENCOUNTER — Other Ambulatory Visit: Payer: Self-pay

## 2019-08-02 ENCOUNTER — Other Ambulatory Visit (INDEPENDENT_AMBULATORY_CARE_PROVIDER_SITE_OTHER): Payer: Medicare HMO

## 2019-08-02 DIAGNOSIS — D72821 Monocytosis (symptomatic): Secondary | ICD-10-CM | POA: Diagnosis not present

## 2019-08-02 LAB — CBC WITH DIFFERENTIAL/PLATELET
Basophils Absolute: 0.1 10*3/uL (ref 0.0–0.1)
Basophils Relative: 0.9 % (ref 0.0–3.0)
Eosinophils Absolute: 0.4 10*3/uL (ref 0.0–0.7)
Eosinophils Relative: 4.2 % (ref 0.0–5.0)
HCT: 40.5 % (ref 36.0–46.0)
Hemoglobin: 13.4 g/dL (ref 12.0–15.0)
Lymphocytes Relative: 21.2 % (ref 12.0–46.0)
Lymphs Abs: 1.8 10*3/uL (ref 0.7–4.0)
MCHC: 33.1 g/dL (ref 30.0–36.0)
MCV: 93.2 fl (ref 78.0–100.0)
Monocytes Absolute: 1 10*3/uL (ref 0.1–1.0)
Monocytes Relative: 11.7 % (ref 3.0–12.0)
Neutro Abs: 5.4 10*3/uL (ref 1.4–7.7)
Neutrophils Relative %: 62 % (ref 43.0–77.0)
Platelets: 191 10*3/uL (ref 150.0–400.0)
RBC: 4.34 Mil/uL (ref 3.87–5.11)
RDW: 14.3 % (ref 11.5–15.5)
WBC: 8.7 10*3/uL (ref 4.0–10.5)

## 2019-08-03 DIAGNOSIS — J301 Allergic rhinitis due to pollen: Secondary | ICD-10-CM | POA: Diagnosis not present

## 2019-08-17 DIAGNOSIS — J301 Allergic rhinitis due to pollen: Secondary | ICD-10-CM | POA: Diagnosis not present

## 2019-08-18 DIAGNOSIS — L821 Other seborrheic keratosis: Secondary | ICD-10-CM | POA: Diagnosis not present

## 2019-08-24 DIAGNOSIS — J301 Allergic rhinitis due to pollen: Secondary | ICD-10-CM | POA: Diagnosis not present

## 2019-08-26 DIAGNOSIS — G4733 Obstructive sleep apnea (adult) (pediatric): Secondary | ICD-10-CM | POA: Diagnosis not present

## 2019-08-27 ENCOUNTER — Ambulatory Visit: Payer: Medicare HMO | Admitting: Cardiovascular Disease

## 2019-08-30 DIAGNOSIS — G4733 Obstructive sleep apnea (adult) (pediatric): Secondary | ICD-10-CM | POA: Diagnosis not present

## 2019-08-31 DIAGNOSIS — J301 Allergic rhinitis due to pollen: Secondary | ICD-10-CM | POA: Diagnosis not present

## 2019-09-01 DIAGNOSIS — J301 Allergic rhinitis due to pollen: Secondary | ICD-10-CM | POA: Diagnosis not present

## 2019-09-02 ENCOUNTER — Encounter: Payer: Self-pay | Admitting: Cardiovascular Disease

## 2019-09-02 ENCOUNTER — Ambulatory Visit (INDEPENDENT_AMBULATORY_CARE_PROVIDER_SITE_OTHER): Payer: Medicare HMO | Admitting: Cardiovascular Disease

## 2019-09-02 ENCOUNTER — Other Ambulatory Visit: Payer: Self-pay

## 2019-09-02 VITALS — BP 140/84 | HR 78 | Temp 96.6°F | Ht 65.0 in | Wt 232.8 lb

## 2019-09-02 DIAGNOSIS — I482 Chronic atrial fibrillation, unspecified: Secondary | ICD-10-CM | POA: Diagnosis not present

## 2019-09-02 DIAGNOSIS — I10 Essential (primary) hypertension: Secondary | ICD-10-CM | POA: Diagnosis not present

## 2019-09-02 NOTE — Progress Notes (Signed)
Cardiology Office Note   Date:  09/02/2019   ID:  Crystal Haas, DOB Jan 13, 1946, MRN [id number]  PCP:  Glori Luis, MD  Cardiologist:   Lorine Bears, MD   Chief Complaint  Patient presents with  . other    6 month follow up. Meds reviewed by the pt. verbally. "doing well."       History of Present Illness: Crystal Haas is a 73 y.o. female who presents for a follow-up visit regarding chronic atrial fibrillation and hypertension.  Echocardiogram in July, 2016 showed normal LV systolic function with mildly dilated right and left atrium.  She is tolerating anticoagulation with Eliquis.   She has known history of sleep apnea on CPAP. She had previous knee surgery. Losartan was added during last visit due to elevated blood pressure. She was hospitalized in January with lower GI bleed.  Colonoscopy showed multiple diverticula with no active bleeding.  Eliquis was interrupted for few days and resumed with no issues.    She has been doing well with no recent chest pain or shortness of breath.  No palpitations or dizziness.  She takes her medications regularly.   Past Medical History:  Diagnosis Date  . Anemia    distant past  . Anxiety   . Arthritis    "everywhere" - big toes worst  . Chronic atrial fibrillation (HCC)    a. on eliquis; b. CHADS2VASc at least 2 (age x 1, female)  . Depression   . GERD (gastroesophageal reflux disease)    RARE  . Heart murmur    mild - followed by PCP  . Knee pain   . Motion sickness    back seat of car  . OSA on CPAP    CPAP-4 PSI  . Seasonal allergies    takes allergy weekly    Past Surgical History:  Procedure Laterality Date  . BREAST REDUCTION SURGERY Bilateral 08/29/2016   Procedure: BILATERAL MAMMARY REDUCTION  (BREAST)WITH LIPOSUCTION;  Surgeon: Peggye Form, DO;  Location: [facility];  Service: Plastics;  Laterality: Bilateral;  . CATARACT EXTRACTION W/PHACO Left 05/03/2015   Procedure: CATARACT  EXTRACTION PHACO AND INTRAOCULAR LENS PLACEMENT (IOC);  Surgeon: Lockie Mola, MD;  Location: [facility];  Service: Ophthalmology;  Laterality: Left;  CPAP  . CATARACT EXTRACTION W/PHACO Right 10/09/2016   Procedure: CATARACT EXTRACTION PHACO AND INTRAOCULAR LENS PLACEMENT (IOC);  Surgeon: Lockie Mola, MD;  Location: [facility];  Service: Ophthalmology;  Laterality: Right;  sleep apnea  . CHONDROPLASTY Right 04/29/2016   Procedure: CHONDROPLASTY;  Surgeon: Donato Heinz, MD;  Location: [facility];  Service: Orthopedics;  Laterality: Right;  . COLONOSCOPY WITH PROPOFOL N/A 10/24/2018   Procedure: COLONOSCOPY WITH PROPOFOL;  Surgeon: Pasty Spillers, MD;  Location: [facility];  Service: Endoscopy;  Laterality: N/A;  . EYE SURGERY    . KNEE ARTHROPLASTY Right 03/25/2018   Procedure: COMPUTER ASSISTED TOTAL KNEE ARTHROPLASTY;  Surgeon: Donato Heinz, MD;  Location: [facility];  Service: Orthopedics;  Laterality: Right;  . KNEE ARTHROSCOPY WITH LATERAL MENISECTOMY  04/29/2016   Procedure: KNEE ARTHROSCOPY WITH LATERAL MENISECTOMY;  Surgeon: Donato Heinz, MD;  Location: [facility];  Service: Orthopedics;;  . KNEE ARTHROSCOPY WITH MEDIAL MENISECTOMY  04/29/2016   Procedure: KNEE ARTHROSCOPY WITH MEDIAL MENISECTOMY;  Surgeon: Donato Heinz, MD;  Location: [facility];  Service: Orthopedics;;  . REDUCTION MAMMAPLASTY Bilateral 09/2016  . RETINAL DETACHMENT SURGERY Left May 03, 2015   Dr. Inez Pilgrim, [facility]  .  TUBAL LIGATION       Current Outpatient Medications  Medication Sig Dispense Refill  . apixaban (ELIQUIS) 5 MG TABS tablet Take 1 tablet (5 mg total) by mouth 2 (two) times daily. 180 tablet 1  . atorvastatin (LIPITOR) 20 MG tablet TAKE 1 TABLET BY MOUTH ONCE DAILY 90 tablet 3  . B Complex-C (B-COMPLEX WITH VITAMIN C) tablet Take 1 tablet by mouth daily.    . cetirizine (ZYRTEC) 10 MG tablet Take 1 tablet (10 mg total) by mouth daily. 90 tablet 0  . EPIPEN 2-PAK  0.3 MG/0.3ML SOAJ injection Inject 0.3 mLs (0.3 mg total) into the muscle as directed. AS NEEDED FOR ANAPHYLAXIS 2 Device 0  . fluticasone (FLONASE) 50 MCG/ACT nasal spray Place 2 sprays into both nostrils daily. 16 g 0  . GLUCOSAMINE-CHONDROITIN DS PO Take 3,000 mg by mouth daily.    Marland Kitchen. losartan (COZAAR) 25 MG tablet Take 1 tablet (25 mg total) by mouth daily. 90 tablet 3  . montelukast (SINGULAIR) 10 MG tablet Take 10 mg by mouth at bedtime.     . Multiple Vitamin (MULTIVITAMIN WITH MINERALS) TABS tablet Take 1 tablet by mouth daily. One-A-Day Active 65+    . NON FORMULARY 10 each by Other route daily. GIN SOAKED RAISINS FOR PAIN RELIEF    . Olopatadine HCl (PATADAY) 0.2 % SOLN Place 1 drop into both eyes daily.    Trellis Moment. Papaya CHEW Chew 3-4 each by mouth as needed.    . sertraline (ZOLOFT) 50 MG tablet Take 0.5 tablets (25 mg total) by mouth daily for 7 days, THEN 1 tablet (50 mg total) daily. 90 tablet 1  . zolpidem (AMBIEN) 5 MG tablet Take 1 tablet (5 mg total) by mouth at bedtime as needed for sleep. Please keep scheduled appointment to get further refills. 30 tablet 0   No current facility-administered medications for this visit.     Allergies:   Apple, Daucus carota, Ivp dye [iodinated diagnostic agents], Other, Strawberry (diagnostic), Strawberry extract, Iodine, and Tape    Social History:  The patient  reports that she quit smoking about 49 years ago. Her smoking use included cigarettes. She has a 0.50 pack-year smoking history. She has never used smokeless tobacco. She reports current alcohol use of about 7.0 standard drinks of alcohol per week. She reports that she does not use drugs.      ROS:  Please see the history of present illness.   Otherwise, review of systems are positive for none.   All other systems are reviewed and negative.    PHYSICAL EXAM: VS:  BP 140/84 (BP Location: Left Arm, Patient Position: Sitting, Cuff Size: Normal)   Pulse 78   Temp (!) 96.6 F (35.9 C)    Ht 5\' 5"  (1.651 m)   Wt 232 lb 12 oz (105.6 kg)   BMI 38.73 kg/m  , BMI Body mass index is 38.73 kg/m. GEN: Well nourished, well developed, in no acute distress  HEENT: normal  Neck: no JVD, carotid bruits, or masses Cardiac: Irregularly irregular; no murmurs, rubs, or gallops,no edema  Respiratory:  clear to auscultation bilaterally, normal work of breathing GI: soft, nontender, nondistended, + BS MS: no deformity or atrophy  Skin: warm and dry, no rash Neuro:  Strength and sensation are intact Psych: euthymic mood, full affect Radial and brachial pulses are normal and equal bilaterally.  EKG:  EKG  ordered today. EKG showed atrial fibrillation with ventricular rate of 78 bpm.  Recent Labs: 10/22/2018:  Magnesium 2.4 07/26/2019: ALT 17; BUN 19; Creatinine, Ser 0.87; Potassium 4.3; Sodium 141; TSH 2.96 08/02/2019: Hemoglobin 13.4; Platelets 191.0    Lipid Panel    Component Value Date/Time   CHOL 147 10/24/2017 1230   CHOL 186 09/04/2015 1051   TRIG 95.0 10/24/2017 1230   HDL 59.60 10/24/2017 1230   HDL 66 09/04/2015 1051   CHOLHDL 2 10/24/2017 1230   VLDL 19.0 10/24/2017 1230   LDLCALC 69 10/24/2017 1230   LDLCALC 99 09/04/2015 1051   LDLDIRECT 60.0 09/30/2016 1129      Wt Readings from Last 3 Encounters:  09/02/19 232 lb 12 oz (105.6 kg)  07/26/19 236 lb 3.2 oz (107.1 kg)  01/20/19 213 lb (96.6 kg)        ASSESSMENT AND PLAN:  1.  Chronic atrial fibrillation: Ventricular rate is controlled without any medication.  She is tolerating anticoagulation with Eliquis.  Labs in October were unremarkable.  2.  Essential hypertension: Blood pressure is reasonably controlled.  3.  Knee arthritis: She is still waiting to have knee replacement.   Disposition:   FU with me in 12 months.  Signed,  Kathlyn Sacramento, MD  09/02/2019 11:18 AM    [place]

## 2019-09-02 NOTE — Patient Instructions (Addendum)
Medication Instructions:  Your physician recommends that you continue on your current medications as directed. Please refer to the Current Medication list given to you today.  *If you need a refill on your cardiac medications before your next appointment, please call your pharmacy*  Lab Work: None ordered If you have labs (blood work) drawn today and your tests are completely normal, you will receive your results only by: . MyChart Message (if you have MyChart) OR . A paper copy in the mail If you have any lab test that is abnormal or we need to change your treatment, we will call you to review the results.  Testing/Procedures: None ordered  Follow-Up: At [facility], you and your health needs are our priority.  As part of our continuing mission to provide you with exceptional heart care, we have created designated Provider Care Teams.  These Care Teams include your primary Cardiologist (physician) and Advanced Practice Providers (APPs -  Physician Assistants and Nurse Practitioners) who all work together to provide you with the care you need, when you need it.  Your next appointment:   12 month(s)  The format for your next appointment:   In Person  Provider:    You may see Muhammad Arida, MD or one of the following Advanced Practice Providers on your designated Care Team:    Christopher Berge, NP  Ryan Dunn, PA-C  Jacquelyn Visser, PA-C   Other Instructions N/A  

## 2019-09-07 DIAGNOSIS — J301 Allergic rhinitis due to pollen: Secondary | ICD-10-CM | POA: Diagnosis not present

## 2019-09-08 ENCOUNTER — Telehealth: Payer: Self-pay | Admitting: Cardiovascular Disease

## 2019-09-08 NOTE — Telephone Encounter (Signed)
Patient assistance application for Eliquis placed on Dr. Arida's desk to be signed. 

## 2019-09-08 NOTE — Telephone Encounter (Signed)
Patient dropped off [employer] patient assistance forms to be completed  ?Placed in nurse box ? ?

## 2019-09-09 NOTE — Telephone Encounter (Addendum)
Patient assistance application and documentation for Eliquis faxed to [place]. Fax # [phone] Phone # [phone].  Patient dropped of her original documents with her application to the office. Patient documents mailed back to her.  Fax confirmation received.

## 2019-09-10 ENCOUNTER — Other Ambulatory Visit: Payer: Self-pay | Admitting: Cardiovascular Disease

## 2019-09-10 NOTE — Telephone Encounter (Signed)
Please review for refill. Thanks!  

## 2019-09-10 NOTE — Telephone Encounter (Signed)
Last OV 09/02/2019 Scr 0.87 on 07/26/2019 73 years old 105.6 kg

## 2019-09-14 DIAGNOSIS — J301 Allergic rhinitis due to pollen: Secondary | ICD-10-CM | POA: Diagnosis not present

## 2019-09-21 DIAGNOSIS — J301 Allergic rhinitis due to pollen: Secondary | ICD-10-CM | POA: Diagnosis not present

## 2019-09-25 DIAGNOSIS — G4733 Obstructive sleep apnea (adult) (pediatric): Secondary | ICD-10-CM | POA: Diagnosis not present

## 2019-09-27 NOTE — Telephone Encounter (Signed)
[place] and spoke with [place]. Hassan Rowan that the form was refaxed on 11/30//20 with the patients name and dob. Lovey Newcomer searched their system and was able to locate the fax. Lovey Newcomer sts that nothing further is needed from [place] at this time. Will await the decision for the Elqiuis patient assistance.

## 2019-09-27 NOTE — Telephone Encounter (Signed)
Application needs to have an updated provider patient that includes patient name and date of birth   This is page 4/4 - patient name and dob to be added and resubmitted to fax [phone]    Call back with questions [phone]

## 2019-09-28 DIAGNOSIS — J301 Allergic rhinitis due to pollen: Secondary | ICD-10-CM | POA: Diagnosis not present

## 2019-09-28 NOTE — Telephone Encounter (Signed)
Decision received from [employer]. Patient assistance for Elquis has been denied. Reason: Medication is covered by the patient's insurance plan. A letter regarding the decision has been mailed to the patient.

## 2019-09-30 NOTE — Telephone Encounter (Signed)
Per fax received from [place], patient's application has been approved free of charge from 09/29/2019 through 10/21/2019. Patient has been informed as well.

## 2019-10-04 DIAGNOSIS — J301 Allergic rhinitis due to pollen: Secondary | ICD-10-CM | POA: Diagnosis not present

## 2019-10-12 ENCOUNTER — Ambulatory Visit (INDEPENDENT_AMBULATORY_CARE_PROVIDER_SITE_OTHER): Payer: Medicare HMO

## 2019-10-12 ENCOUNTER — Other Ambulatory Visit: Payer: Self-pay

## 2019-10-12 VITALS — Ht 65.0 in | Wt 232.0 lb

## 2019-10-12 DIAGNOSIS — Z0000 Encounter for general adult medical examination without abnormal findings: Secondary | ICD-10-CM | POA: Diagnosis not present

## 2019-10-12 DIAGNOSIS — J301 Allergic rhinitis due to pollen: Secondary | ICD-10-CM | POA: Diagnosis not present

## 2019-10-12 NOTE — Patient Instructions (Addendum)
  Crystal Haas , Thank you for taking time to come for your Medicare Wellness Visit. I appreciate your ongoing commitment to your health goals. Please review the following plan we discussed and let me know if I can assist you in the future.   These are the goals we discussed: Goals      Patient Stated   . Increase physical activity (pt-stated)     I would like to aerobic dance when possible. Walk more for exercise.        This is a list of the screening recommended for you and due dates:  Health Maintenance  Topic Date Due  . Tetanus Vaccine  10/11/2020*  . Mammogram  11/05/2019  . Colon Cancer Screening  10/24/2028  . Flu Shot  Completed  . DEXA scan (bone density measurement)  Completed  .  Hepatitis C: One time screening is recommended by Center for Disease Control  (CDC) for  adults born from [birth year] through 1964.   Completed  . Pneumonia vaccines  Completed  *Topic was postponed. The date shown is not the original due date.

## 2019-10-12 NOTE — Progress Notes (Signed)
Subjective:   Crystal Haas is a 73 y.o. female who presents for Medicare Annual (Subsequent) preventive examination.  Review of Systems:  No ROS.  Medicare Wellness Virtual Visit.  Visual/audio telehealth visit, UTA vital signs.   Wt/Ht provided by patient See social history for additional risk factors.   Cardiac Risk Factors include: advanced age (>68men, >56 women);hypertension     Objective:     Vitals: Ht  (1.651 m)   Wt 232 lb (105.2 kg)   BMI 38.61 kg/m   Body mass index is 38.61 kg/m.  Advanced Directives 10/12/2019 10/23/2018 10/22/2018 03/25/2018 03/25/2018 01/01/2017 10/09/2016  Does Patient Have a Medical Advance Directive? Yes No No Yes Yes No No  Type of Diplomatic Services operational officer - - Healthcare Power of [place];Living will Healthcare Power of [place];Living will - -  Does patient want to make changes to medical advance directive? No - Patient declined - - No - Patient declined - - -  Copy of Healthcare Power of Attorney in Chart? No - copy requested - - - Yes - -  Would patient like information on creating a medical advance directive? - No - Patient declined No - Patient declined - - Yes (MAU/Ambulatory/Procedural Areas - Information given) No - Patient declined    Tobacco Social History   Tobacco Use  Smoking Status Former Smoker  . Packs/day: 0.25  . Years: 2.00  . Pack years: 0.50  . Types: Cigarettes  . Quit date: 10/21/1969  . Years since quitting: 50.0  Smokeless Tobacco Never Used     Counseling given: Not Answered   Clinical Intake:  Pre-visit preparation completed: Yes        Diabetes: No  How often do you need to have someone help you when you read instructions, pamphlets, or other written materials from your doctor or pharmacy?: 1 - Never  Interpreter Needed?: No     Past Medical History:  Diagnosis Date  . Anemia    distant past  . Anxiety   . Arthritis    "everywhere" - big toes worst  . Chronic atrial  fibrillation (HCC)    a. on eliquis; b. CHADS2VASc at least 2 (age x 1, female)  . Depression   . GERD (gastroesophageal reflux disease)    RARE  . Heart murmur    mild - followed by PCP  . Knee pain   . Motion sickness    back seat of car  . OSA on CPAP    CPAP-4 PSI  . Seasonal allergies    takes allergy weekly   Past Surgical History:  Procedure Laterality Date  . BREAST REDUCTION SURGERY Bilateral 08/29/2016   Procedure: BILATERAL MAMMARY REDUCTION  (BREAST)WITH LIPOSUCTION;  Surgeon: Peggye Form, DO;  Location: [facility];  Service: Plastics;  Laterality: Bilateral;  . CATARACT EXTRACTION W/PHACO Left 05/03/2015   Procedure: CATARACT EXTRACTION PHACO AND INTRAOCULAR LENS PLACEMENT (IOC);  Surgeon: Lockie Mola, MD;  Location: [facility];  Service: Ophthalmology;  Laterality: Left;  CPAP  . CATARACT EXTRACTION W/PHACO Right 10/09/2016   Procedure: CATARACT EXTRACTION PHACO AND INTRAOCULAR LENS PLACEMENT (IOC);  Surgeon: Lockie Mola, MD;  Location: [facility];  Service: Ophthalmology;  Laterality: Right;  sleep apnea  . CHONDROPLASTY Right 04/29/2016   Procedure: CHONDROPLASTY;  Surgeon: Donato Heinz, MD;  Location: [facility];  Service: Orthopedics;  Laterality: Right;  . COLONOSCOPY WITH PROPOFOL N/A 10/24/2018   Procedure: COLONOSCOPY WITH PROPOFOL;  Surgeon: Maximino Greenland,  Dolphus Jenny, MD;  Location: [facility];  Service: Endoscopy;  Laterality: N/A;  . EYE SURGERY    . KNEE ARTHROPLASTY Right 03/25/2018   Procedure: COMPUTER ASSISTED TOTAL KNEE ARTHROPLASTY;  Surgeon: Donato Heinz, MD;  Location: [facility];  Service: Orthopedics;  Laterality: Right;  . KNEE ARTHROSCOPY WITH LATERAL MENISECTOMY  04/29/2016   Procedure: KNEE ARTHROSCOPY WITH LATERAL MENISECTOMY;  Surgeon: Donato Heinz, MD;  Location: [facility];  Service: Orthopedics;;  . KNEE ARTHROSCOPY WITH MEDIAL MENISECTOMY  04/29/2016   Procedure: KNEE ARTHROSCOPY WITH MEDIAL  MENISECTOMY;  Surgeon: Donato Heinz, MD;  Location: [facility];  Service: Orthopedics;;  . REDUCTION MAMMAPLASTY Bilateral 09/2016  . RETINAL DETACHMENT SURGERY Left May 03, 2015   Dr. Inez Pilgrim, [facility]  . TUBAL LIGATION     Family History  Problem Relation Age of Onset  . Alcoholism Other   . Heart disease Other   . Breast cancer Neg Hx    Social History   Socioeconomic History  . Marital status: Widowed    Spouse name: Not on file  . Number of children: Not on file  . Years of education: Not on file  . Highest education level: Not on file  Occupational History  . Not on file  Tobacco Use  . Smoking status: Former Smoker    Packs/day: 0.25    Years: 2.00    Pack years: 0.50    Types: Cigarettes    Quit date: 10/21/1969    Years since quitting: 50.0  . Smokeless tobacco: Never Used  Substance and Sexual Activity  . Alcohol use: Yes    Alcohol/week: 7.0 standard drinks    Types: 7 Shots of liquor per week    Comment: SOCIALLY  . Drug use: No  . Sexual activity: Not on file  Other Topics Concern  . Not on file  Social History Narrative  . Not on file   Social Determinants of Health   Financial Resource Strain:   . Difficulty of Paying Living Expenses: Not on file  Food Insecurity:   . Worried About Programme researcher, broadcasting/film/video in the Last Year: Not on file  . Ran Out of Food in the Last Year: Not on file  Transportation Needs:   . Lack of Transportation (Medical): Not on file  . Lack of Transportation (Non-Medical): Not on file  Physical Activity:   . Days of Exercise per Week: Not on file  . Minutes of Exercise per Session: Not on file  Stress:   . Feeling of Stress : Not on file  Social Connections:   . Frequency of Communication with Friends and Family: Not on file  . Frequency of Social Gatherings with Friends and Family: Not on file  . Attends Religious Services: Not on file  . Active Member of Clubs or Organizations: Not on file  . Attends Banker  Meetings: Not on file  . Marital Status: Not on file    Outpatient Encounter Medications as of 10/12/2019  Medication Sig  . atorvastatin (LIPITOR) 20 MG tablet TAKE 1 TABLET BY MOUTH ONCE DAILY  . B Complex-C (B-COMPLEX WITH VITAMIN C) tablet Take 1 tablet by mouth daily.  . cetirizine (ZYRTEC) 10 MG tablet Take 1 tablet (10 mg total) by mouth daily.  Marland Kitchen ELIQUIS 5 MG TABS tablet TAKE 1 TABLET TWICE DAILY  . EPIPEN 2-PAK 0.3 MG/0.3ML SOAJ injection Inject 0.3 mLs (0.3 mg total) into the muscle as directed. AS NEEDED FOR ANAPHYLAXIS  .  fluticasone (FLONASE) 50 MCG/ACT nasal spray Place 2 sprays into both nostrils daily.  Marland Kitchen GLUCOSAMINE-CHONDROITIN DS PO Take 3,000 mg by mouth daily.  Marland Kitchen losartan (COZAAR) 25 MG tablet Take 1 tablet (25 mg total) by mouth daily.  . montelukast (SINGULAIR) 10 MG tablet Take 10 mg by mouth at bedtime.   . Multiple Vitamin (MULTIVITAMIN WITH MINERALS) TABS tablet Take 1 tablet by mouth daily. One-A-Day Active 65+  . NON FORMULARY 10 each by Other route daily. GIN SOAKED RAISINS FOR PAIN RELIEF  . Olopatadine HCl (PATADAY) 0.2 % SOLN Place 1 drop into both eyes daily.  Michail Jewels CHEW Chew 3-4 each by mouth as needed.  . sertraline (ZOLOFT) 50 MG tablet Take 0.5 tablets (25 mg total) by mouth daily for 7 days, THEN 1 tablet (50 mg total) daily.  . [DISCONTINUED] zolpidem (AMBIEN) 5 MG tablet Take 1 tablet (5 mg total) by mouth at bedtime as needed for sleep. Please keep scheduled appointment to get further refills.   No facility-administered encounter medications on file as of 10/12/2019.    Activities of Daily Living In your present state of health, do you have any difficulty performing the following activities: 10/12/2019 10/23/2018  Hearing? N -  Vision? N -  Difficulty concentrating or making decisions? N -  Walking or climbing stairs? N -  Dressing or bathing? N -  Doing errands, shopping? N N  Preparing Food and eating ? N -  Using the Toilet? N -  In the  past six months, have you accidently leaked urine? N -  Do you have problems with loss of bowel control? N -  Managing your Medications? N -  Managing your Finances? N -  Housekeeping or managing your Housekeeping? N -  Some recent data might be hidden    Patient Care Team: Leone Haven, MD as PCP - General (Family Medicine) Wellington Hampshire, MD as PCP - Cardiology (Cardiology)    Assessment:   This is a routine wellness examination for Estrellita.  Nurse connected with patient 10/12/19 at 12:00 PM EST by a telephone enabled telemedicine application and verified that I am speaking with the correct person using two identifiers. Patient stated full name and DOB. Patient gave permission to continue with virtual visit. Patient's location was at home and Nurse's location was at [place] office.   Patient is alert and oriented x3. Patient denies difficulty focusing or concentrating. Patient likes to knit, play solitaire and sodoku for brain stimulation.   Health Maintenance Due: -Tdap vaccine- discussed; to be completed with doctor in visit or local pharmacy.   See completed HM at the end of note.   Eye: Visual acuity not assessed. Virtual visit. Followed by their ophthalmologist. Glaucoma; drops in use.   Dental: UTD   Hearing: Demonstrates normal hearing during visit.  Safety:  Patient feels safe at home- yes Patient does have smoke detectors at home- yes Patient does wear sunscreen or protective clothing when in direct sunlight - yes Patient does wear seat belt when in a moving vehicle - yes Patient drives- yes Adequate lighting in walkways free from debris- yes Grab bars and handrails used as appropriate- yes Ambulates with an assistive device- no Cell phone on person when ambulating outside of the home- yes  Social: Alcohol intake - yes      Smoking history- former  Smokers in home? none Illicit drug use? none  Medication: Taking as directed and without issues.    Pill box in use -  yes  Self managed - yes   Covid-19: Precautions and sickness symptoms discussed. Wears mask, social distancing, hand hygiene as appropriate.   Activities of Daily Living Patient denies needing assistance with: household chores, feeding themselves, getting from bed to chair, getting to the toilet, bathing/showering, dressing, managing money, or preparing meals.   Discussed the importance of a healthy diet, water intake and the benefits of aerobic exercise.    Physical activity- active at home, no routine.  Diet:  Regular Water: good intake Caffeine: 2 cups  Other Providers Patient Care Team: Glori LuisSonnenberg, Eric G, MD as PCP - General (Family Medicine) Iran OuchArida, Muhammad A, MD as PCP - Cardiology (Cardiology)  Exercise Activities and Dietary recommendations Current Exercise Habits: The patient does not participate in regular exercise at present  Goals      Patient Stated   . Increase physical activity (pt-stated)     I would like to aerobic dance when possible. Walk more for exercise.        Fall Risk Fall Risk  10/12/2019 07/26/2019 11/03/2018 04/20/2018 01/01/2017  Falls in the past year? 0 0 0 No Yes  Number falls in past yr: - - 0 - 1  Injury with Fall? - - 0 - -  Follow up Falls prevention discussed Falls evaluation completed - - Falls prevention discussed  Comment - - - - Tripped on the step when walking   Timed Get Up and Go performed: no, virtual visit  Depression Screen PHQ 2/9 Scores 10/12/2019 07/26/2019 11/03/2018 01/01/2017  PHQ - 2 Score 1 0 0 0  PHQ- 9 Score - 6 - -     Cognitive Function MMSE - Mini Mental State Exam 01/01/2017  Orientation to time 5  Orientation to Place 5  Registration 3  Attention/ Calculation 5  Recall 3  Language- name 2 objects 2  Language- repeat 1  Language- follow 3 step command 3  Language- read & follow direction 1  Write a sentence 1  Copy design 1  Total score 30     6CIT Screen 10/12/2019  What Year?  0 points  What month? 0 points  What time? 0 points  Count back from 20 0 points  Months in reverse 2 points  Repeat phrase 0 points  Total Score 2    Immunization History  Administered Date(s) Administered  . Fluad Quad(high Dose 65+) 07/26/2019  . Influenza, High Dose Seasonal PF 08/30/2015, 08/06/2016, 08/14/2017, 11/03/2018  . Pneumococcal Conjugate-13 01/01/2013  . Pneumococcal Polysaccharide-23 01/01/2014  . Zoster Recombinat (Shingrix) 01/02/2016   Screening Tests Health Maintenance  Topic Date Due  . TETANUS/TDAP  10/11/2020 (Originally 07/29/1965)  . MAMMOGRAM  11/05/2019  . COLONOSCOPY  10/24/2028  . INFLUENZA VACCINE  Completed  . DEXA SCAN  Completed  . Hepatitis C Screening  Completed  . PNA vac Low Risk Adult  Completed      Plan:   Keep all routine maintenance appointments.   Follow up 11/03/19 @ 1:45  Medicare Attestation I have personally reviewed: The patient's medical and social history Their use of alcohol, tobacco or illicit drugs Their current medications and supplements The patient's functional ability including ADLs,fall risks, home safety risks, cognitive, and hearing and visual impairment Diet and physical activities Evidence for depression   I have reviewed and discussed with patient certain preventive protocols, quality metrics, and best practice recommendations.   Ashok PallOBrien-Blaney, Debara Kamphuis L, LPN  16/10/960412/22/2020

## 2019-10-19 DIAGNOSIS — J301 Allergic rhinitis due to pollen: Secondary | ICD-10-CM | POA: Diagnosis not present

## 2019-10-26 DIAGNOSIS — G4733 Obstructive sleep apnea (adult) (pediatric): Secondary | ICD-10-CM | POA: Diagnosis not present

## 2019-10-26 DIAGNOSIS — J301 Allergic rhinitis due to pollen: Secondary | ICD-10-CM | POA: Diagnosis not present

## 2019-11-02 DIAGNOSIS — J301 Allergic rhinitis due to pollen: Secondary | ICD-10-CM | POA: Diagnosis not present

## 2019-11-03 ENCOUNTER — Other Ambulatory Visit: Payer: Self-pay

## 2019-11-03 ENCOUNTER — Ambulatory Visit (INDEPENDENT_AMBULATORY_CARE_PROVIDER_SITE_OTHER): Payer: Medicare HMO | Admitting: Family Medicine

## 2019-11-03 ENCOUNTER — Telehealth: Payer: Self-pay | Admitting: Cardiovascular Disease

## 2019-11-03 ENCOUNTER — Encounter: Payer: Self-pay | Admitting: Family Medicine

## 2019-11-03 VITALS — Ht 65.0 in | Wt 228.6 lb

## 2019-11-03 DIAGNOSIS — R7303 Prediabetes: Secondary | ICD-10-CM | POA: Diagnosis not present

## 2019-11-03 DIAGNOSIS — F3342 Major depressive disorder, recurrent, in full remission: Secondary | ICD-10-CM | POA: Diagnosis not present

## 2019-11-03 DIAGNOSIS — R61 Generalized hyperhidrosis: Secondary | ICD-10-CM

## 2019-11-03 MED ORDER — SERTRALINE HCL 50 MG PO TABS: ORAL_TABLET | ORAL | 1 refills | Status: DC

## 2019-11-03 NOTE — Assessment & Plan Note (Signed)
She continues to have these issues despite coming off of Paxil.  Given the persistence and drenching nature we will try to get CT chest abdomen and pelvis approved.  Orders were placed.

## 2019-11-03 NOTE — Telephone Encounter (Signed)
Patient calling in stating she was approved for free Eliquis and would like to discuss next steps

## 2019-11-03 NOTE — Assessment & Plan Note (Signed)
Encouraged her to continue with dietary changes and exercise.

## 2019-11-03 NOTE — Assessment & Plan Note (Signed)
Asymptomatic.  She will continue Zoloft. 

## 2019-11-03 NOTE — Progress Notes (Signed)
Virtual Visit via video Note  This visit type was conducted due to national recommendations for restrictions regarding the COVID-19 pandemic (e.g. social distancing).  This format is felt to be most appropriate for this patient at this time.  All issues noted in this document were discussed and addressed.  No physical exam was performed (except for noted visual exam findings with Video Visits).   I connected with Crystal Haas today at  1:45 PM EST by a video enabled telemedicine application and verified that I am speaking with the correct person using two identifiers. Location patient: home Location provider: work Persons participating in the virtual visit: patient, provider  I discussed the limitations, risks, security and privacy concerns of performing an evaluation and management service by telephone and the availability of in person appointments. I also discussed with the patient that there may be a patient responsible charge related to this service. The patient expressed understanding and agreed to proceed.  Reason for visit: follow-up  HPI: Depression: Patient notes no depression.  No anxiety.  She is now on Zoloft.  She transitioned off the Paxil and the first week was rough.  She cried frequently.  That resolved after 1 week.  No recurrent symptoms.  No SI or HI.  Night sweats: Patient notes these are somewhat improved.  Coming off the Paxil did not seem to help.  She has been using cooling blankets and cooling sheets.  It is oftentimes into the 50s in her house at night.  No weight loss.  No itching.  Prior evaluation with lab work unremarkable.  Prediabetes: No polyuria or polydipsia.  She has gone on Nutrisystem to help her lose weight.  She is doing portion control.  She lost 10 pounds in 1 week.  She is going to start doing her exercise videos and she is walking some.   ROS: See pertinent positives and negatives per HPI.  Past Medical History:  Diagnosis Date  . Anemia    distant past  . Anxiety   . Arthritis    "everywhere" - big toes worst  . Chronic atrial fibrillation (HCC)    a. on eliquis; b. CHADS2VASc at least 2 (age x 1, female)  . Depression   . GERD (gastroesophageal reflux disease)    RARE  . Heart murmur    mild - followed by PCP  . Knee pain   . Motion sickness    back seat of car  . OSA on CPAP    CPAP-4 PSI  . 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 Seasonal allergies    takes allergy weekly  . Status post bilateral breast reduction 09/06/2016   Overview:  08/29/16    Past Surgical History:  Procedure Laterality Date  . BREAST REDUCTION SURGERY Bilateral 08/29/2016   Procedure: BILATERAL MAMMARY REDUCTION  (BREAST)WITH LIPOSUCTION;  Surgeon: Peggye Form, DO;  Location: [facility];  Service: Plastics;  Laterality: Bilateral;  . CATARACT EXTRACTION W/PHACO Left 05/03/2015   Procedure: CATARACT EXTRACTION PHACO AND INTRAOCULAR LENS PLACEMENT (IOC);  Surgeon: Lockie Mola, MD;  Location: [facility];  Service: Ophthalmology;  Laterality: Left;  CPAP  . CATARACT EXTRACTION W/PHACO Right 10/09/2016   Procedure: CATARACT EXTRACTION PHACO AND INTRAOCULAR LENS PLACEMENT (IOC);  Surgeon: Lockie Mola, MD;  Location: [facility];  Service: Ophthalmology;  Laterality: Right;  sleep apnea  . CHONDROPLASTY Right 04/29/2016   Procedure: CHONDROPLASTY;  Surgeon: Donato Heinz, MD;  Location: [facility];  Service: Orthopedics;  Laterality: Right;  . COLONOSCOPY WITH PROPOFOL N/A 10/24/2018   Procedure: COLONOSCOPY WITH PROPOFOL;  Surgeon: Virgel Manifold, MD;  Location: [facility];  Service: Endoscopy;  Laterality: N/A;  . EYE SURGERY    . KNEE ARTHROPLASTY Right 03/25/2018   Procedure: COMPUTER ASSISTED TOTAL KNEE ARTHROPLASTY;  Surgeon: Dereck Leep, MD;  Location: [facility];  Service: Orthopedics;  Laterality: Right;  . KNEE ARTHROSCOPY WITH LATERAL MENISECTOMY  04/29/2016   Procedure: KNEE  ARTHROSCOPY WITH LATERAL MENISECTOMY;  Surgeon: Dereck Leep, MD;  Location: [facility];  Service: Orthopedics;;  . KNEE ARTHROSCOPY WITH MEDIAL MENISECTOMY  04/29/2016   Procedure: KNEE ARTHROSCOPY WITH MEDIAL MENISECTOMY;  Surgeon: Dereck Leep, MD;  Location: [facility];  Service: Orthopedics;;  . REDUCTION MAMMAPLASTY Bilateral 09/2016  . RETINAL DETACHMENT SURGERY Left May 03, 2015   Dr. Wallace Going, [facility]  . TUBAL LIGATION      Family History  Problem Relation Age of Onset  . Alcoholism Other   . Heart disease Other   . Breast cancer Neg Hx     SOCIAL HX: Former smoker   Current Outpatient Medications:  .  atorvastatin (LIPITOR) 20 MG tablet, TAKE 1 TABLET BY MOUTH ONCE DAILY, Disp: 90 tablet, Rfl: 3 .  B Complex-C (B-COMPLEX WITH VITAMIN C) tablet, Take 1 tablet by mouth daily., Disp: , Rfl:  .  cetirizine (ZYRTEC) 10 MG tablet, Take 1 tablet (10 mg total) by mouth daily., Disp: 90 tablet, Rfl: 0 .  ELIQUIS 5 MG TABS tablet, TAKE 1 TABLET TWICE DAILY, Disp: 180 tablet, Rfl: 1 .  EPIPEN 2-PAK 0.3 MG/0.3ML SOAJ injection, Inject 0.3 mLs (0.3 mg total) into the muscle as directed. AS NEEDED FOR ANAPHYLAXIS, Disp: 2 Device, Rfl: 0 .  fluticasone (FLONASE) 50 MCG/ACT nasal spray, Place 2 sprays into both nostrils daily., Disp: 16 g, Rfl: 0 .  GLUCOSAMINE-CHONDROITIN DS PO, Take 3,000 mg by mouth daily., Disp: , Rfl:  .  losartan (COZAAR) 25 MG tablet, Take 1 tablet (25 mg total) by mouth daily., Disp: 90 tablet, Rfl: 3 .  montelukast (SINGULAIR) 10 MG tablet, Take 10 mg by mouth at bedtime. , Disp: , Rfl:  .  Multiple Vitamin (MULTIVITAMIN WITH MINERALS) TABS tablet, Take 1 tablet by mouth daily. One-A-Day Active 65+, Disp: , Rfl:  .  NON FORMULARY, 10 each by Other route daily. GIN SOAKED RAISINS FOR PAIN RELIEF, Disp: , Rfl:  .  Olopatadine HCl (PATADAY) 0.2 % SOLN, Place 1 drop into both eyes daily., Disp: , Rfl:  .  Papaya CHEW, Chew 3-4 each by mouth as needed., Disp: , Rfl:  .   sertraline (ZOLOFT) 50 MG tablet, Take 0.5 tablets (25 mg total) by mouth daily for 7 days, THEN 1 tablet (50 mg total) daily., Disp: 90 tablet, Rfl: 1  EXAM:  VITALS per patient if applicable:  GENERAL: alert, oriented, appears well and in no acute distress  HEENT: atraumatic, conjunttiva clear, no obvious abnormalities on inspection of external nose and ears  NECK: normal movements of the head and neck  LUNGS: on inspection no signs of respiratory distress, breathing rate appears normal, no obvious gross SOB, gasping or wheezing  CV: no obvious cyanosis  MS: moves all visible extremities without noticeable abnormality  PSYCH/NEURO: pleasant and cooperative, no obvious depression or anxiety, speech and thought processing grossly intact  ASSESSMENT AND PLAN:  Discussed the following assessment and plan:  Prediabetes Continue diet and exercise.  Discussed healthy sustainable weight loss is  1 to 2 pounds weekly.  Morbid obesity (HCC) Encouraged her to continue with dietary changes and exercise.  Night sweats She continues to have these issues despite coming off of Paxil.  Given the persistence and drenching nature we will try to get CT chest abdomen and pelvis approved.  Orders were placed.  Depression Asymptomatic.  She will continue Zoloft.   Orders Placed This Encounter  Procedures  . CT Chest Wo Contrast    Standing Status:   Future    Standing Expiration Date:   12/31/2020    Order Specific Question:   ** REASON FOR EXAM (FREE TEXT)    Answer:   patient with persistent drenching night sweats, imaging to rule out underlying cancer as a cause    Order Specific Question:   Preferred imaging location?    Answer:   [facility]    Order Specific Question:   Radiology Contrast Protocol - do NOT remove file path    Answer:   \\[email or web address]  . CT Abdomen Pelvis Wo Contrast    Standing Status:   Future    Standing Expiration Date:    01/31/2021    Order Specific Question:   ** REASON FOR EXAM (FREE TEXT)    Answer:   patient with persistent drenching night sweats, imaging to rule out underlying cancer as a cause    Order Specific Question:   Preferred imaging location?    Answer:   [facility]    Order Specific Question:   Is Oral Contrast requested for this exam?    Answer:   Yes, Per Radiology protocol    Order Specific Question:   Radiology Contrast Protocol - do NOT remove file path    Answer:   \\[email or web address]    Meds ordered this encounter  Medications  . sertraline (ZOLOFT) 50 MG tablet    Sig: Take 0.5 tablets (25 mg total) by mouth daily for 7 days, THEN 1 tablet (50 mg total) daily.    Dispense:  90 tablet    Refill:  1     I discussed the assessment and treatment plan with the patient. The patient was provided an opportunity to ask questions and all were answered. The patient agreed with the plan and demonstrated an understanding of the instructions.   The patient was advised to call back or seek an in-person evaluation if the symptoms worsen or if the condition fails to improve as anticipated.   Marikay Alar, MD

## 2019-11-03 NOTE — Assessment & Plan Note (Signed)
Continue diet and exercise.  Discussed healthy sustainable weight loss is 1 to 2 pounds weekly.

## 2019-11-04 NOTE — Telephone Encounter (Signed)
Spoke with the patient. Advised her that once approved the medication should be mailed to her directly. Recommended that she contact  Alver Fisher to check the status. Patient verbalized understanding and voiced appreciation for the call back.

## 2019-11-05 DIAGNOSIS — G4733 Obstructive sleep apnea (adult) (pediatric): Secondary | ICD-10-CM | POA: Diagnosis not present

## 2019-11-09 ENCOUNTER — Encounter: Payer: Self-pay | Admitting: Family Medicine

## 2019-11-09 DIAGNOSIS — J301 Allergic rhinitis due to pollen: Secondary | ICD-10-CM | POA: Diagnosis not present

## 2019-11-09 NOTE — Telephone Encounter (Signed)
Patient dropped off Crystal Haas forms to be completed  Placed on [employer]

## 2019-11-09 NOTE — Telephone Encounter (Signed)
Patient assistance application for [employer] Eliquis completed and faxed including the documentation dropped off by the patient.  Fax confirmation received.

## 2019-11-10 ENCOUNTER — Encounter: Payer: Self-pay | Admitting: Family Medicine

## 2019-11-10 NOTE — Telephone Encounter (Signed)
Fax received from [employer] Patient [facility] stating the patient is not eligible to receive Eliquis because it is covered by her insurance plan.

## 2019-11-10 NOTE — Telephone Encounter (Signed)
I called and explained to the patient that her CT scan is without contrast therefor she does not need it, she understood.  Donald Jacque,cma

## 2019-11-16 ENCOUNTER — Ambulatory Visit
Admission: RE | Admit: 2019-11-16 | Discharge: 2019-11-16 | Disposition: A | Discharge status: Home / Self Care | Payer: Medicare HMO | Source: Ambulatory Visit | Attending: Family Medicine | Admitting: Family Medicine

## 2019-11-16 ENCOUNTER — Other Ambulatory Visit: Payer: Self-pay

## 2019-11-16 ENCOUNTER — Other Ambulatory Visit: Payer: Self-pay | Admitting: Cardiovascular Disease

## 2019-11-16 DIAGNOSIS — K5730 Diverticulosis of large intestine without perforation or abscess without bleeding: Secondary | ICD-10-CM | POA: Insufficient documentation

## 2019-11-16 DIAGNOSIS — J301 Allergic rhinitis due to pollen: Secondary | ICD-10-CM | POA: Diagnosis not present

## 2019-11-16 DIAGNOSIS — I700 Atherosclerosis of aorta: Secondary | ICD-10-CM | POA: Diagnosis not present

## 2019-11-16 DIAGNOSIS — R61 Generalized hyperhidrosis: Secondary | ICD-10-CM | POA: Diagnosis not present

## 2019-11-16 NOTE — Telephone Encounter (Signed)
*  STAT* If patient is at the pharmacy, call can be transferred to refill team.   1. Which medications need to be refilled? (please list name of each medication and dose if known) Eliquis 5 mg bid  2. Which pharmacy/location (including street and city if local pharmacy) is medication to be sent to? [facility]  3. Do they need a 30 day or 90 day supply? 30

## 2019-11-17 MED ORDER — APIXABAN 5 MG PO TABS: 5.0000 mg | ORAL_TABLET | Freq: Two times a day (BID) | ORAL | 1 refills | Status: DC

## 2019-11-17 NOTE — Telephone Encounter (Signed)
Pt's age 74, wt 103.7 kg, SCr 0.87, CrCl 95.69, last ov w/ MA 09/02/19. Refill sent in as requested.

## 2019-11-18 ENCOUNTER — Telehealth: Payer: Self-pay | Admitting: Family Medicine

## 2019-11-18 NOTE — Telephone Encounter (Signed)
Patient is out of her Ambien and would like a refill. I do not see this on patient's medication list. Pt states Dr. Birdie Sons has filled in the past. Please call patient.

## 2019-11-18 NOTE — Telephone Encounter (Signed)
Patient's pharmacy called and was asking about filling patients medication. Pharmacy said the last refill was on 01/22/2019, written by Dr. Birdie Sons

## 2019-11-19 ENCOUNTER — Other Ambulatory Visit: Payer: Self-pay | Admitting: Internal Medicine

## 2019-11-19 DIAGNOSIS — G4700 Insomnia, unspecified: Secondary | ICD-10-CM

## 2019-11-19 MED ORDER — ZOLPIDEM TARTRATE 5 MG PO TABS: 5.0000 mg | ORAL_TABLET | Freq: Every evening | ORAL | 0 refills | Status: DC | PRN

## 2019-11-19 NOTE — Telephone Encounter (Signed)
Under med history he last filled 07/2019 sent to [facility] further refills PCP  tMS

## 2019-11-23 DIAGNOSIS — J301 Allergic rhinitis due to pollen: Secondary | ICD-10-CM | POA: Diagnosis not present

## 2019-11-24 DIAGNOSIS — J301 Allergic rhinitis due to pollen: Secondary | ICD-10-CM | POA: Diagnosis not present

## 2019-11-26 DIAGNOSIS — G4733 Obstructive sleep apnea (adult) (pediatric): Secondary | ICD-10-CM | POA: Diagnosis not present

## 2019-11-30 DIAGNOSIS — J301 Allergic rhinitis due to pollen: Secondary | ICD-10-CM | POA: Diagnosis not present

## 2019-12-18 IMAGING — DX DG KNEE 1-2V PORT*R*
2 series · 2 of 2 positions shown · non-contrast
Comparison: None.

CLINICAL DATA: Status post total right knee replacement.

EXAM:
PORTABLE RIGHT KNEE - 1-2 VIEW

[knee ap]
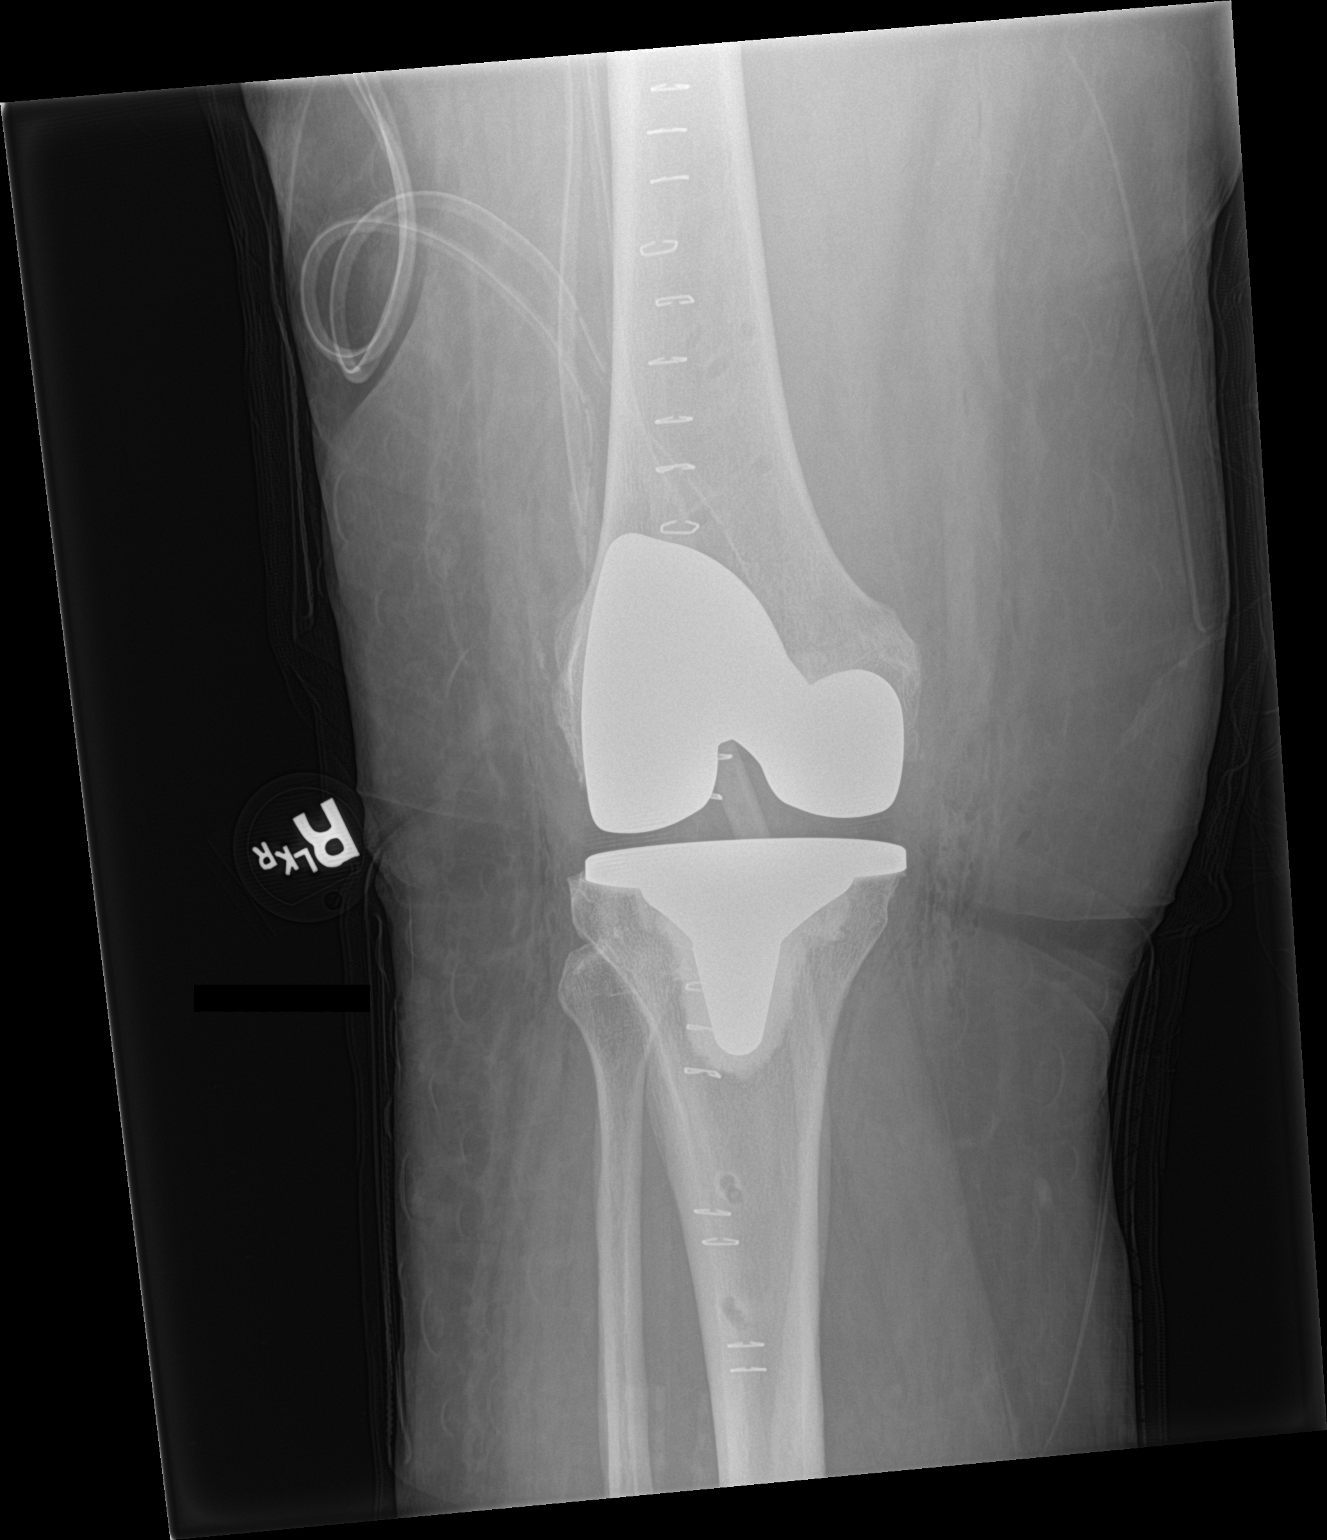

[knee lat]
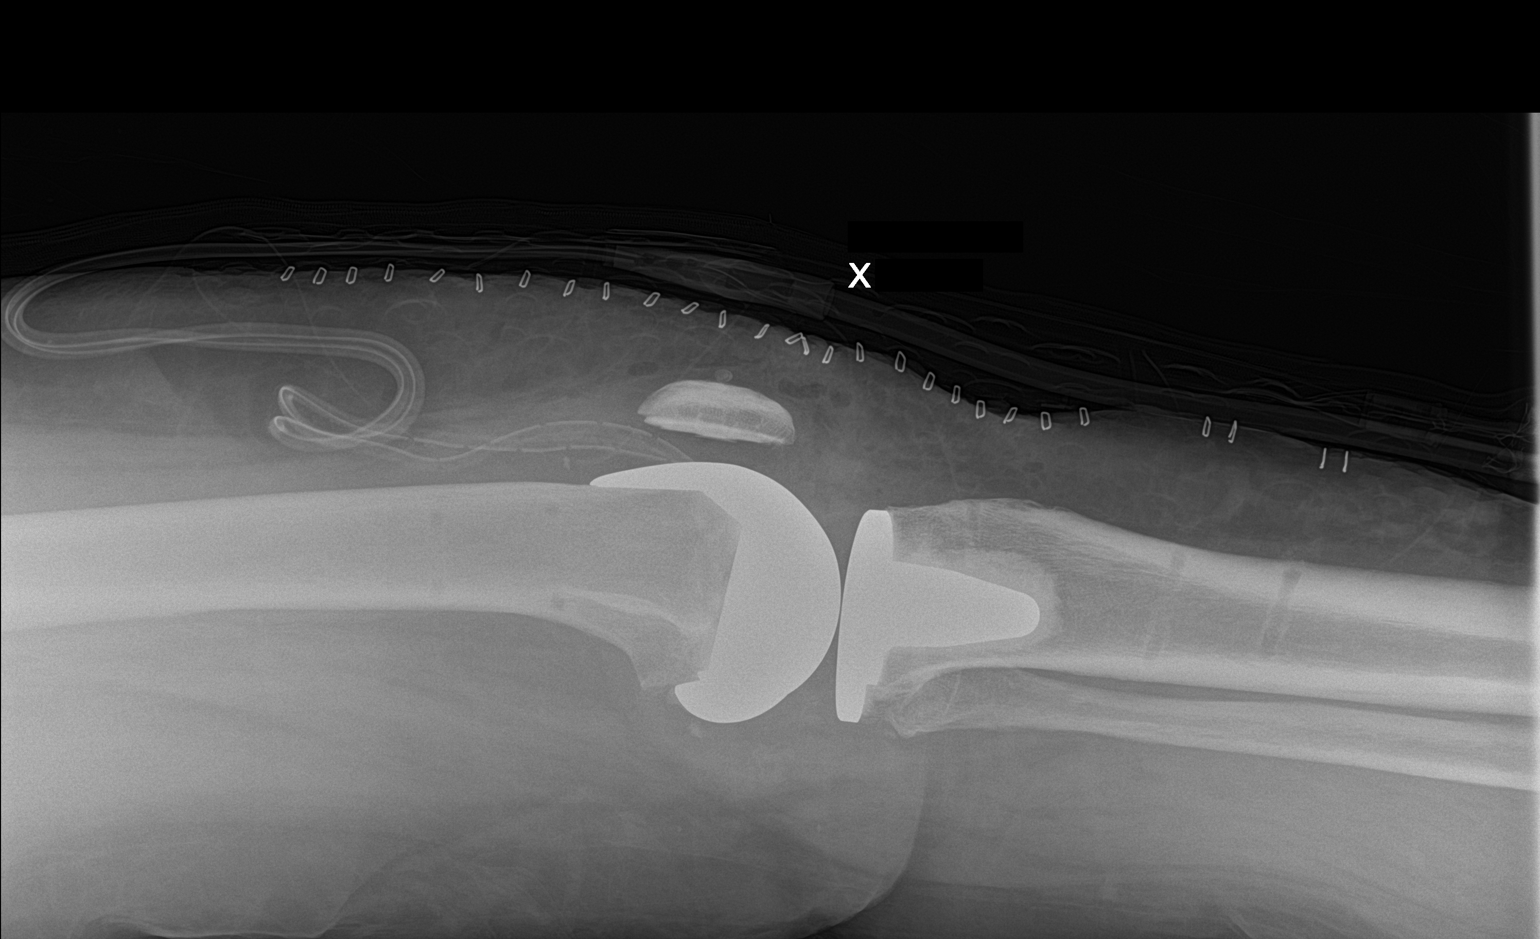

[2 of 2 positions shown; findings below may reference images not displayed]

FINDINGS: Post recent total right knee arthroplasty with normal alignment of
the orthopedic hardware. No evidence of fracture. Expected soft
tissue swelling and emphysema. Surgical drain is in the
suprapatellar joint space.
IMPRESSION: Postsurgical findings from recent total right knee arthroplasty
without evidence of immediate complications.

## 2019-12-21 ENCOUNTER — Telehealth: Payer: Self-pay | Admitting: Family Medicine

## 2019-12-22 NOTE — Telephone Encounter (Signed)
err

## 2019-12-24 DIAGNOSIS — G4733 Obstructive sleep apnea (adult) (pediatric): Secondary | ICD-10-CM | POA: Diagnosis not present

## 2019-12-29 ENCOUNTER — Other Ambulatory Visit: Payer: Self-pay | Admitting: Cardiovascular Disease

## 2019-12-29 MED ORDER — APIXABAN 5 MG PO TABS: 5.0000 mg | ORAL_TABLET | Freq: Two times a day (BID) | ORAL | 1 refills | Status: DC

## 2019-12-29 NOTE — Telephone Encounter (Signed)
Please review for refill, Thanks !  

## 2019-12-29 NOTE — Telephone Encounter (Signed)
*  STAT* If patient is at the pharmacy, call can be transferred to refill team.   1. Which medications need to be refilled? (please list name of each medication and dose if known) Eliquis 5mg   2. Which pharmacy/location (including street and city if local pharmacy) is medication to be sent to? [facility]  3. Do they need a 30 day or 90 day supply? 90

## 2019-12-29 NOTE — Telephone Encounter (Addendum)
Pt's age 74, wt 103.7 kg, SCr 0.87, CrCl 95.69, last ov w/ MA 09/02/19. Eliquis refill sent in as requested.

## 2020-01-11 DIAGNOSIS — J301 Allergic rhinitis due to pollen: Secondary | ICD-10-CM | POA: Diagnosis not present

## 2020-01-21 ENCOUNTER — Encounter: Payer: Self-pay | Admitting: Family Medicine

## 2020-01-24 DIAGNOSIS — G4733 Obstructive sleep apnea (adult) (pediatric): Secondary | ICD-10-CM | POA: Diagnosis not present

## 2020-01-25 DIAGNOSIS — J301 Allergic rhinitis due to pollen: Secondary | ICD-10-CM | POA: Diagnosis not present

## 2020-02-01 ENCOUNTER — Ambulatory Visit: Payer: Medicare HMO | Admitting: Family Medicine

## 2020-02-01 DIAGNOSIS — J301 Allergic rhinitis due to pollen: Secondary | ICD-10-CM | POA: Diagnosis not present

## 2020-02-08 DIAGNOSIS — J301 Allergic rhinitis due to pollen: Secondary | ICD-10-CM | POA: Diagnosis not present

## 2020-02-14 DIAGNOSIS — J301 Allergic rhinitis due to pollen: Secondary | ICD-10-CM | POA: Diagnosis not present

## 2020-02-15 DIAGNOSIS — J301 Allergic rhinitis due to pollen: Secondary | ICD-10-CM | POA: Diagnosis not present

## 2020-02-22 DIAGNOSIS — J301 Allergic rhinitis due to pollen: Secondary | ICD-10-CM | POA: Diagnosis not present

## 2020-02-23 DIAGNOSIS — G4733 Obstructive sleep apnea (adult) (pediatric): Secondary | ICD-10-CM | POA: Diagnosis not present

## 2020-02-29 DIAGNOSIS — J301 Allergic rhinitis due to pollen: Secondary | ICD-10-CM | POA: Diagnosis not present

## 2020-03-07 DIAGNOSIS — J301 Allergic rhinitis due to pollen: Secondary | ICD-10-CM | POA: Diagnosis not present

## 2020-03-13 ENCOUNTER — Encounter: Payer: Self-pay | Admitting: Family Medicine

## 2020-03-14 DIAGNOSIS — J301 Allergic rhinitis due to pollen: Secondary | ICD-10-CM | POA: Diagnosis not present

## 2020-03-14 MED ORDER — APIXABAN 5 MG PO TABS: 5.0000 mg | ORAL_TABLET | Freq: Two times a day (BID) | ORAL | 1 refills | Status: DC

## 2020-03-14 MED ORDER — SERTRALINE HCL 50 MG PO TABS: 50.0000 mg | ORAL_TABLET | Freq: Every day | ORAL | 1 refills | Status: DC

## 2020-03-15 ENCOUNTER — Telehealth: Payer: Self-pay | Admitting: Cardiovascular Disease

## 2020-03-15 NOTE — Telephone Encounter (Signed)
Noted and received. Provider portion filled out and placed on Dr. Jari Sportsman desk to be signed.

## 2020-03-15 NOTE — Telephone Encounter (Signed)
Patient dropped off Patient Assistance forms to be completed and faxed Placed in nurse box

## 2020-03-21 DIAGNOSIS — J301 Allergic rhinitis due to pollen: Secondary | ICD-10-CM | POA: Diagnosis not present

## 2020-03-24 DIAGNOSIS — J301 Allergic rhinitis due to pollen: Secondary | ICD-10-CM | POA: Diagnosis not present

## 2020-03-25 DIAGNOSIS — G4733 Obstructive sleep apnea (adult) (pediatric): Secondary | ICD-10-CM | POA: Diagnosis not present

## 2020-03-28 DIAGNOSIS — J301 Allergic rhinitis due to pollen: Secondary | ICD-10-CM | POA: Diagnosis not present

## 2020-03-29 DIAGNOSIS — J301 Allergic rhinitis due to pollen: Secondary | ICD-10-CM | POA: Diagnosis not present

## 2020-03-30 NOTE — Telephone Encounter (Signed)
Patient assistance application and documentation faxed for Eliquis faxed to [facility].  Fax confirmation received.

## 2020-04-03 NOTE — Telephone Encounter (Signed)
Pt has been approved from [employer] for [place] Application case #: [id number] Effective 04/01/2020 - 10/20/2020 Patient has been notified through a telephone call

## 2020-04-04 DIAGNOSIS — J301 Allergic rhinitis due to pollen: Secondary | ICD-10-CM | POA: Diagnosis not present

## 2020-04-07 DIAGNOSIS — J301 Allergic rhinitis due to pollen: Secondary | ICD-10-CM | POA: Diagnosis not present

## 2020-04-11 DIAGNOSIS — J301 Allergic rhinitis due to pollen: Secondary | ICD-10-CM | POA: Diagnosis not present

## 2020-04-14 DIAGNOSIS — J301 Allergic rhinitis due to pollen: Secondary | ICD-10-CM | POA: Diagnosis not present

## 2020-04-18 DIAGNOSIS — G4733 Obstructive sleep apnea (adult) (pediatric): Secondary | ICD-10-CM | POA: Diagnosis not present

## 2020-04-18 DIAGNOSIS — J301 Allergic rhinitis due to pollen: Secondary | ICD-10-CM | POA: Diagnosis not present

## 2020-04-20 DIAGNOSIS — J301 Allergic rhinitis due to pollen: Secondary | ICD-10-CM | POA: Diagnosis not present

## 2020-04-24 DIAGNOSIS — G4733 Obstructive sleep apnea (adult) (pediatric): Secondary | ICD-10-CM | POA: Diagnosis not present

## 2020-04-25 DIAGNOSIS — J301 Allergic rhinitis due to pollen: Secondary | ICD-10-CM | POA: Diagnosis not present

## 2020-04-28 ENCOUNTER — Emergency Department
Admission: EM | Admit: 2020-04-28 | Discharge: 2020-04-28 | Disposition: A | Discharge status: Home / Self Care | Payer: Medicare HMO | Attending: Student in an Organized Health Care Education/Training Program | Admitting: Student in an Organized Health Care Education/Training Program

## 2020-04-28 ENCOUNTER — Other Ambulatory Visit: Payer: Self-pay

## 2020-04-28 ENCOUNTER — Telehealth: Payer: Self-pay | Admitting: Cardiovascular Disease

## 2020-04-28 ENCOUNTER — Emergency Department: Payer: Medicare HMO

## 2020-04-28 DIAGNOSIS — R079 Chest pain, unspecified: Secondary | ICD-10-CM | POA: Diagnosis not present

## 2020-04-28 DIAGNOSIS — R0789 Other chest pain: Secondary | ICD-10-CM | POA: Diagnosis not present

## 2020-04-28 DIAGNOSIS — I10 Essential (primary) hypertension: Secondary | ICD-10-CM | POA: Diagnosis not present

## 2020-04-28 DIAGNOSIS — Z87891 Personal history of nicotine dependence: Secondary | ICD-10-CM | POA: Diagnosis not present

## 2020-04-28 DIAGNOSIS — Z79899 Other long term (current) drug therapy: Secondary | ICD-10-CM | POA: Insufficient documentation

## 2020-04-28 LAB — BASIC METABOLIC PANEL
Anion gap: 10 (ref 5–15)
BUN: 15 mg/dL (ref 8–23)
CO2: 26 mmol/L (ref 22–32)
Calcium: 9.1 mg/dL (ref 8.9–10.3)
Chloride: 105 mmol/L (ref 98–111)
Creatinine, Ser: 0.99 mg/dL (ref 0.44–1.00)
GFR calc Af Amer: >60 mL/min (ref >60)
GFR calc non Af Amer: 57 mL/min — ABNORMAL LOW (ref >60)
Glucose, Bld: 117 mg/dL — ABNORMAL HIGH (ref 70–99)
Potassium: 4.1 mmol/L (ref 3.5–5.1)
Sodium: 141 mmol/L (ref 135–145)

## 2020-04-28 LAB — CBC
HCT: 41.9 % (ref 36.0–46.0)
Hemoglobin: 14.4 g/dL (ref 12.0–15.0)
MCH: 30.6 pg (ref 26.0–34.0)
MCHC: 34.4 g/dL (ref 30.0–36.0)
MCV: 89.1 fL (ref 80.0–100.0)
Platelets: 183 10*3/uL (ref 150–400)
RBC: 4.7 MIL/uL (ref 3.87–5.11)
RDW: 13.3 % (ref 11.5–15.5)
WBC: 7.7 10*3/uL (ref 4.0–10.5)
nRBC: 0 % (ref 0.0–0.2)

## 2020-04-28 LAB — TROPONIN I (HIGH SENSITIVITY)
Troponin I (High Sensitivity): 8 ng/L (ref <18)
Troponin I (High Sensitivity): 8 ng/L (ref <18)

## 2020-04-28 MED ORDER — SODIUM CHLORIDE 0.9% FLUSH: 3.0000 mL | Freq: Once | INTRAVENOUS | Status: DC

## 2020-04-28 NOTE — Telephone Encounter (Signed)
Incoming triage call received.  Patients that she is currently having chest pain and she is pulling into a parking space now at [facility] ED. Advised the patient that I agreed that she should proceed to the ED for evaluation.

## 2020-04-28 NOTE — ED Triage Notes (Signed)
Pt states after being up and moving about she started having centralized chest pain, pt reports that she has never felt this way before, pt reports a lot of pressure in the center of her chest,without radiation

## 2020-04-28 NOTE — ED Provider Notes (Signed)
[facility] Emergency Department Provider Note    First MD Initiated Contact with Patient 04/28/20 1524     (approximate)  I have reviewed the triage vital signs and the nursing notes.   HISTORY  Chief Complaint Chest Pain       HPI Crystal Haas is a 74 y.o. female with the below listed past medical history on Eliquis for history of A. fib presents to the ER for chest pain and pressure that started around 8:00 while she was working on her computer this morning.  She denies any palpitations.  Denies any shortness of breath at this time.  She has not had any measured fevers.  No cough or congestion.  She is been compliant with her blood pressure medications and Eliquis.  She denies any history of coronary disease but states that she has "leaky valve ." She denies any abdominal pain nausea or vomiting.  She denies any pain radiating to her jaw neck or back.  She is completely pain-free at this time.   Past Medical History:  Diagnosis Date  . Anemia    distant past  . Anxiety   . Arthritis    "everywhere" - big toes worst  . Chronic atrial fibrillation (HCC)    a. on eliquis; b. CHADS2VASc at least 2 (age x 1, female)  . Depression   . GERD (gastroesophageal reflux disease)    RARE  . Heart murmur    mild - followed by PCP  . Knee pain   . Motion sickness    back seat of car  . OSA on CPAP    CPAP-4 PSI  . 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 Seasonal allergies    takes allergy weekly  . Status post bilateral breast reduction 09/06/2016   Overview:  08/29/16   Family History  Problem Relation Age of Onset  . Alcoholism Other   . Heart disease Other   . Breast cancer Neg Hx    Past Surgical History:  Procedure Laterality Date  . BREAST REDUCTION SURGERY Bilateral 08/29/2016   Procedure: BILATERAL MAMMARY REDUCTION  (BREAST)WITH LIPOSUCTION;  Surgeon: Peggye Form, DO;  Location: [facility];  Service: Plastics;   Laterality: Bilateral;  . CATARACT EXTRACTION W/PHACO Left 05/03/2015   Procedure: CATARACT EXTRACTION PHACO AND INTRAOCULAR LENS PLACEMENT (IOC);  Surgeon: Lockie Mola, MD;  Location: [facility];  Service: Ophthalmology;  Laterality: Left;  CPAP  . CATARACT EXTRACTION W/PHACO Right 10/09/2016   Procedure: CATARACT EXTRACTION PHACO AND INTRAOCULAR LENS PLACEMENT (IOC);  Surgeon: Lockie Mola, MD;  Location: [facility];  Service: Ophthalmology;  Laterality: Right;  sleep apnea  . CHONDROPLASTY Right 04/29/2016   Procedure: CHONDROPLASTY;  Surgeon: Donato Heinz, MD;  Location: [facility] OR[facility];  Service: Orthopedics;  Laterality: Right;  . COLONOSCOPY WITH PROPOFOL N/A 10/24/2018   Procedure: COLONOSCOPY WITH PROPOFOL;  Surgeon: Pasty Spillers, MD;  Location: [facility];  Service: Endoscopy;  Laterality: N/A;  . EYE SURGERY    . KNEE ARTHROPLASTY Right 03/25/2018   Procedure: COMPUTER ASSISTED TOTAL KNEE ARTHROPLASTY;  Surgeon: Donato Heinz, MD;  Location: [facility];  Service: Orthopedics;  Laterality: Right;  . KNEE ARTHROSCOPY WITH LATERAL MENISECTOMY  04/29/2016   Procedure: KNEE ARTHROSCOPY WITH LATERAL MENISECTOMY;  Surgeon: Donato Heinz, MD;  Location: [facility];  Service: Orthopedics;;  . KNEE ARTHROSCOPY WITH MEDIAL MENISECTOMY  04/29/2016   Procedure: KNEE ARTHROSCOPY WITH MEDIAL MENISECTOMY;  Surgeon: Donato Heinz, MD;  Location: [facility];  Service: Orthopedics;;  . REDUCTION MAMMAPLASTY Bilateral 09/2016  . RETINAL DETACHMENT SURGERY Left May 03, 2015   [facility]  . TUBAL LIGATION     Patient Active Problem List   Diagnosis Date Noted  . Acute right ankle pain 07/27/2019  . Prediabetes 07/26/2019  . Rib pain on right side 11/03/2018  . Headache 11/03/2018  . Diverticulosis of large intestine without diverticulitis   . Lower GI bleed 10/23/2018  . Hematochezia   . Night sweats 04/20/2018  . Hand tingling 10/11/2017  . Morbid  obesity (HCC) 10/11/2017  . Degenerative arthritis of right knee 07/17/2017  . Allergic rhinitis 09/02/2016  . Skin lesion 09/02/2016  . Change of skin color 08/06/2016  . Injury of right rotator cuff 02/15/2016  . Hyperkalemia 09/12/2015  . Chronic atrial fibrillation (HCC) 09/11/2015  . Sleep apnea 08/30/2015  . Depression 06/21/2015  . Chronic insomnia 05/30/2015  . Anxiety 05/27/2014  . Hypertension 05/27/2014  . Cataracts, bilateral 05/25/2014  . 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  . 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 Macular hole of left eye 05/24/2014  . 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 Authorizing Provider  apixaban (ELIQUIS) 5 MG TABS tablet Take 1 tablet (5 mg total) by mouth 2 (two) times daily. 03/14/20   Iran OuchArida, Muhammad A, MD  atorvastatin (LIPITOR) 20 MG tablet TAKE 1 TABLET BY MOUTH ONCE DAILY 04/12/19   Iran OuchArida, Muhammad A, MD  B Complex-C (B-COMPLEX WITH VITAMIN C) tablet Take 1 tablet by mouth daily.    [provider]  cetirizine (ZYRTEC) 10 MG tablet Take 1 tablet (10 mg total) by mouth daily. 01/22/19   Glori LuisSonnenberg, Eric G, MD  EPIPEN 2-PAK 0.3 MG/0.3ML SOAJ injection Inject 0.3 mLs (0.3 mg total) into the muscle as directed. AS NEEDED FOR ANAPHYLAXIS 01/22/19   Glori LuisSonnenberg, Eric G, MD  fluticasone [facility](FLONASE) 50 MCG/ACT nasal spray Place 2 sprays into both nostrils daily. 01/22/19   Glori LuisSonnenberg, Eric G, MD  GLUCOSAMINE-CHONDROITIN DS PO Take 3,000 mg by mouth daily.    [provider]  losartan (COZAAR) 25 MG tablet Take 1 tablet (25 mg total) by mouth daily. 05/21/19   Iran OuchArida, Muhammad A, MD  montelukast (SINGULAIR) 10 MG tablet Take 10 mg by mouth at bedtime.  08/20/19   [provider]  Multiple Vitamin (MULTIVITAMIN WITH MINERALS) TABS tablet Take 1 tablet by mouth daily. One-A-Day Active 65+    [provider]  NON FORMULARY 10 each by Other route daily. GIN SOAKED RAISINS FOR PAIN RELIEF    [provider]  Olopatadine HCl (PATADAY) 0.2 % SOLN Place 1 drop into both eyes daily.    [provider]  Papaya CHEW Chew 3-4 each by mouth as needed.    [provider]  sertraline (ZOLOFT) 50 MG tablet Take 1 tablet (50 mg total) by mouth daily. 03/14/20   Glori LuisSonnenberg, Eric G, MD  zolpidem (AMBIEN) 5 MG tablet Take 1 tablet (5 mg total) by mouth at bedtime as needed for sleep. Further refills PCP Sonnenberg 11/19/19   McLean-Scocuzza, Pasty Spillersracy N, MD    Allergies Apple, Daucus carota, Ivp dye [iodinated diagnostic agents], Other, Strawberry (diagnostic), Strawberry extract, Iodine, and Tape    Social History Social History   Tobacco Use  . Smoking status: Former Smoker    Packs/day:  0.25    Years: 2.00    Pack years: 0.50    Types: Cigarettes    Quit date: 10/21/1969    Years since quitting: 50.5  . Smokeless tobacco: Never Used  Vaping Use  . Vaping Use: Never used  Substance Use Topics  . Alcohol use: Yes    Alcohol/week: 7.0 standard drinks    Types: 7 Shots of liquor per week    Comment: SOCIALLY  . Drug use: No    Review of Systems Patient denies headaches, rhinorrhea, blurry vision, numbness, shortness of breath, chest pain, edema, cough, abdominal pain, nausea, vomiting, diarrhea, dysuria, fevers, rashes or hallucinations unless otherwise stated above in HPI. ____________________________________________   PHYSICAL EXAM:  VITAL SIGNS: Vitals:   04/28/20 1600 04/28/20 1630  BP: (!) 144/82 (!) 154/87  Pulse: 70 81  Resp: 18 (!) 23  Temp:    SpO2: 99% 100%    Constitutional: Alert and oriented.  Eyes: Conjunctivae are normal.  Head: Atraumatic. Nose: No congestion/rhinnorhea. Mouth/Throat: Mucous membranes are moist.   Neck: No stridor. Painless ROM.  Cardiovascular: Normal rate, regular rhythm. Grossly normal heart sounds.  Good  peripheral circulation. Respiratory: Normal respiratory effort.  No retractions. Lungs CTAB. Gastrointestinal: Soft and nontender. No distention. No abdominal bruits. No CVA tenderness. Genitourinary:  Musculoskeletal: No lower extremity tenderness nor edema.  No joint effusions. Neurologic:  Normal speech and language. No gross focal neurologic deficits are appreciated. No facial droop Skin:  Skin is warm, dry and intact. No rash noted. Psychiatric: Mood and affect are normal. Speech and behavior are normal.  ____________________________________________   LABS (all labs ordered are listed, but only abnormal results are displayed)  Results for orders placed or performed during the hospital encounter of 04/28/20 (from the past 24 hour(s))  Basic metabolic panel     Status: Abnormal   Collection Time: 04/28/20 12:17 PM  Result Value Ref Range   Sodium 141 135 - 145 mmol/L   Potassium 4.1 3.5 - 5.1 mmol/L   Chloride 105 98 - 111 mmol/L   CO2 26 22 - 32 mmol/L   Glucose, Bld 117 (H) 70 - 99 mg/dL   BUN 15 8 - 23 mg/dL   Creatinine, Ser 1.61 0.44 - 1.00 mg/dL   Calcium 9.1 8.9 - 09.6 mg/dL   GFR calc non Af Amer 57 (L) >60 mL/min   GFR calc Af Amer >60 >60 mL/min   Anion gap 10 5 - 15  CBC     Status: None   Collection Time: 04/28/20 12:17 PM  Result Value Ref Range   WBC 7.7 4.0 - 10.5 K/uL   RBC 4.70 3.87 - 5.11 MIL/uL   Hemoglobin 14.4 12.0 - 15.0 g/dL   HCT 04.5 36 - 46 %   MCV 89.1 80.0 - 100.0 fL   MCH 30.6 26.0 - 34.0 pg   MCHC 34.4 30.0 - 36.0 g/dL   RDW 40.9 81.1 - 91.4 %   Platelets 183 150 - 400 K/uL   nRBC 0.0 0.0 - 0.2 %  Troponin I (High Sensitivity)     Status: None   Collection Time: 04/28/20 12:17 PM  Result Value Ref Range   Troponin I (High Sensitivity) 8 <18 ng/L  Troponin I (High Sensitivity)     Status: None   Collection Time: 04/28/20  2:47 PM  Result Value Ref Range   Troponin I (High Sensitivity) 8 <18 ng/L    ____________________________________________  EKG My review and personal interpretation at  Time: 12:03   Indication: chestpain  Rate: 85  Rhythm: afib Axis: normal Other: nonspecific st abn, no stemi or depressions ____________________________________________  RADIOLOGY  I personally reviewed all radiographic images ordered to evaluate for the above acute complaints and reviewed radiology reports and findings.  These findings were personally discussed with the patient.  Please see medical record for radiology report.  ____________________________________________   PROCEDURES  Procedure(s) performed:  Procedures    Critical Care performed: no ____________________________________________   INITIAL IMPRESSION / ASSESSMENT AND PLAN / ED COURSE  Pertinent labs & imaging results that were available during my care of the patient were reviewed by me and considered in my medical decision making (see chart for details).   DDX: ACS, pericarditis, esophagitis, boerhaaves, pe, dissection, pna, bronchitis, costochondritis   Crystal Haas is a 74 y.o. who presents to the ED with symptoms as described above.  She is currently well and nontoxic-appearing.  No history of CAD.  She is on Eliquis to have a lower suspicion for PE and she not complaining of any shortness of breath no hypoxia no tachycardia.  Denies any palpitations she is currently in rate controlled A. fib.  Her abdominal exam is soft and benign.  Her cardiac enzymes are nonischemic.  Given her age and risk factors I will consult cardiology.  Clinical Course as of Apr 29 1643  Fri Apr 28, 2020  1623 Patient reassessed.  She remains pain-free.  I discussed case with cardiology in consultation, Dr. Mariah Milling.  We reviewed the patient's EKG previous CT imaging and cardiac work-up.  She does have a heart score of 5 but is currently pain-free with reassuring work-up and troponin profile.  Does not seem consistent with ACS.  She is rate  controlled.  We discussed increasing her losartan to 50 if having persistent elevated blood pressures and to record the blood pressure readings for further blood pressure management with clinic.  Her abdominal exam is soft benign.  She is on Eliquis this does not seem consistent with pericarditis or dissection.  Not consistent with PE or infectious process.  Discussed option for observation the hospital versus outpatient follow-up and I do think it is appropriate for head follow-up with cardiology.  We discussed signs and symptoms for which he should return to the ER.  Patient in agreement with plan.   [PR]    Clinical Course User Index [PR] Willy Eddy, MD    The patient was evaluated in Emergency Department today for the symptoms described in the history of present illness. He/she was evaluated in the context of the global COVID-19 pandemic, which necessitated consideration that the patient might be at risk for infection with the SARS-CoV-2 virus that causes COVID-19. Institutional protocols and algorithms that pertain to the evaluation of patients at risk for COVID-19 are in a state of rapid change based on information released by regulatory bodies including the CDC and federal and state organizations. These policies and algorithms were followed during the patient's care in the ED.  As part of my medical decision making, I reviewed the following data within the electronic MEDICAL RECORD NUMBER Nursing notes reviewed and incorporated, Labs reviewed, notes from prior ED visits and [place] Controlled Substance Database   ____________________________________________   FINAL CLINICAL IMPRESSION(S) / ED DIAGNOSES  Final diagnoses:  Nonspecific chest pain      NEW MEDICATIONS STARTED DURING THIS VISIT:  New Prescriptions   No medications on file     Note:  This document was prepared using  [employer]agon [employer] and may include unintentional dictation errors.    Willy Eddy,  MD 04/28/20 [phone]

## 2020-05-01 NOTE — Telephone Encounter (Signed)
Spoke with the patient and made her aware of Dr. Jari Sportsman recommendation. Patient is agreeable with the plan of care.  Order for [facility] placed. Adv the patient that a scheduler will call her to schedule.  Patient given pre-test instructions with verbalized understanding.   [facility] MYOVIEW  Your caregiver has ordered a Stress Test with nuclear imaging. The purpose of this test is to evaluate the blood supply to your heart muscle. This procedure is referred to as a "Non-Invasive Stress Test." This is because other than having an IV started in your vein, nothing is inserted or "invades" your body. Cardiac stress tests are done to find areas of poor blood flow to the heart by determining the extent of coronary artery disease (CAD). Some patients exercise on a treadmill, which naturally increases the blood flow to your heart, while others who are  unable to walk on a treadmill due to physical limitations have a pharmacologic/chemical stress agent called Lexiscan . This medicine will mimic walking on a treadmill by temporarily increasing your coronary blood flow.   Please note: these test may take anywhere between 2-4 hours to complete  PLEASE REPORT TO [facility] MEDICAL MALL ENTRANCE  THE VOLUNTEERS AT THE FIRST DESK WILL DIRECT YOU WHERE TO GO  Date of Procedure:_____________________________________  Arrival Time for Procedure:______________________________    PLEASE NOTIFY THE OFFICE AT LEAST 24 HOURS IN ADVANCE IF YOU ARE UNABLE TO KEEP YOUR APPOINTMENT.  [phone] AND  PLEASE NOTIFY NUCLEAR MEDICINE AT [facility] AT LEAST 24 HOURS IN ADVANCE IF YOU ARE UNABLE TO KEEP YOUR APPOINTMENT. [phone]  How to prepare for your Myoview test:  1. Do not eat or drink after midnight 2. No caffeine for 24 hours prior to test 3. No smoking 24 hours prior to test. 4. Your medication may be taken with water.  If your doctor stopped a medication because of this test, do not take that medication. 5. Ladies,  please do not wear dresses.  Skirts or pants are appropriate. Please wear a short sleeve shirt. 6. No perfume, cologne or lotion. 7. Wear comfortable walking shoes. No heels!

## 2020-05-01 NOTE — Telephone Encounter (Signed)
She had negative troponin in the ED and was discharged home.  Please schedule her for a Lexiscan Myoview this week and follow-up after.

## 2020-05-02 DIAGNOSIS — J301 Allergic rhinitis due to pollen: Secondary | ICD-10-CM | POA: Diagnosis not present

## 2020-05-02 NOTE — Telephone Encounter (Signed)
Called the patient as requested. Lmtcb.  Patient will need a f/u appt scheduled after her 05/04/20 stress test.

## 2020-05-02 NOTE — Telephone Encounter (Signed)
Scheduled for 7/15 Please call to clarify instructions

## 2020-05-04 ENCOUNTER — Other Ambulatory Visit: Payer: Self-pay | Admitting: *Deleted

## 2020-05-04 MED ORDER — LOSARTAN POTASSIUM 25 MG PO TABS: 25.0000 mg | ORAL_TABLET | Freq: Every day | ORAL | 3 refills | Status: DC

## 2020-05-05 ENCOUNTER — Other Ambulatory Visit: Payer: Medicare HMO

## 2020-05-05 DIAGNOSIS — J301 Allergic rhinitis due to pollen: Secondary | ICD-10-CM | POA: Diagnosis not present

## 2020-05-09 DIAGNOSIS — J301 Allergic rhinitis due to pollen: Secondary | ICD-10-CM | POA: Diagnosis not present

## 2020-05-10 ENCOUNTER — Encounter
Admission: RE | Admit: 2020-05-10 | Discharge: 2020-05-10 | Disposition: A | Discharge status: Home / Self Care | Payer: Medicare HMO | Source: Ambulatory Visit | Attending: Cardiovascular Disease | Admitting: Cardiovascular Disease

## 2020-05-10 ENCOUNTER — Other Ambulatory Visit: Payer: Self-pay

## 2020-05-10 DIAGNOSIS — R079 Chest pain, unspecified: Secondary | ICD-10-CM | POA: Diagnosis not present

## 2020-05-10 LAB — NM MYOCAR MULTI W/SPECT W/WALL MOTION / EF
Estimated workload: 1 METS
Exercise duration (min): 0 min
Exercise duration (sec): 0 s
LV dias vol: 75 mL (ref 46–106)
LV sys vol: 25 mL
MPHR: 148 {beats}/min
Peak HR: 96 {beats}/min
Percent HR: 64 %
Rest HR: 63 {beats}/min
SDS: 1
SRS: 0
SSS: 1
TID: 1.08

## 2020-05-10 MED ORDER — TECHNETIUM TC 99M TETROFOSMIN IV KIT
30.0000 | PACK | Freq: Once | INTRAVENOUS | Status: AC | PRN
  Administered 2020-05-10: 31.041 via INTRAVENOUS

## 2020-05-10 MED ORDER — REGADENOSON 0.4 MG/5ML IV SOLN
0.4000 mg | Freq: Once | INTRAVENOUS | Status: AC
  Administered 2020-05-10: 0.4 mg via INTRAVENOUS

## 2020-05-10 MED ORDER — TECHNETIUM TC 99M TETROFOSMIN IV KIT
10.0000 | PACK | Freq: Once | INTRAVENOUS | Status: AC | PRN
  Administered 2020-05-10: 10.44 via INTRAVENOUS

## 2020-05-12 ENCOUNTER — Other Ambulatory Visit: Payer: Self-pay

## 2020-05-12 MED ORDER — ATORVASTATIN CALCIUM 20 MG PO TABS: ORAL_TABLET | ORAL | 3 refills | Status: DC

## 2020-05-15 NOTE — Telephone Encounter (Signed)
Patient made aware of stress test results and Dr. Jari Sportsman recommendation. Appt scheduled on 05/29/20 @ 9:30am with Gillian Shields, NP. Patient is aware of the appt date, time, and location.

## 2020-05-15 NOTE — Telephone Encounter (Signed)
-----   Message from Iran Ouch, MD sent at 05/15/2020  1:37 PM EDT ----- Inform patient that  stress test was normal. She needs to come for a routine follow-up visit to check on her blood pressure issues.

## 2020-05-16 DIAGNOSIS — J301 Allergic rhinitis due to pollen: Secondary | ICD-10-CM | POA: Diagnosis not present

## 2020-05-19 DIAGNOSIS — J301 Allergic rhinitis due to pollen: Secondary | ICD-10-CM | POA: Diagnosis not present

## 2020-05-24 DIAGNOSIS — J301 Allergic rhinitis due to pollen: Secondary | ICD-10-CM | POA: Diagnosis not present

## 2020-05-25 DIAGNOSIS — G4733 Obstructive sleep apnea (adult) (pediatric): Secondary | ICD-10-CM | POA: Diagnosis not present

## 2020-05-28 NOTE — Progress Notes (Signed)
Office Visit    Patient Name: Crystal LollDiane Haas Date of Encounter: 05/29/2020  Primary Care Provider:  Glori LuisSonnenberg, Eric G, MD Primary Cardiologist:  Lorine BearsMuhammad Arida, MD Electrophysiologist:  None   Chief Complaint    Crystal Haas is a 74 y.o. female with a hx of atrial fibrillation on chronic anticoagulation, HTN, OSA on CPAP presents today for follow up after ED visit, Lexiscan Myoview, and for follow-up of HTN  Past Medical History    Past Medical History:  Diagnosis Date   Anemia    distant past   Anxiety    Arthritis    "everywhere" - big toes worst   Chronic atrial fibrillation (HCC)    a. on eliquis; b. 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   Seasonal allergies    takes allergy weekly   Status post bilateral breast reduction 09/06/2016   Overview:  08/29/16   Past Surgical History:  Procedure Laterality Date   BREAST REDUCTION SURGERY Bilateral 08/29/2016   Procedure: BILATERAL MAMMARY REDUCTION  (BREAST)WITH LIPOSUCTION;  Surgeon: Peggye Formlaire S Dillingham, DO;  Location:  SURGERY CENTER;  Service: Plastics;  Laterality: Bilateral;   CATARACT EXTRACTION W/PHACO Left 05/03/2015   Procedure: CATARACT EXTRACTION PHACO AND INTRAOCULAR LENS PLACEMENT (IOC);  Surgeon: Lockie Molahadwick Brasington, MD;  [facility];  Service: Ophthalmology;  Laterality: Left;  CPAP   CATARACT EXTRACTION W/PHACO Right 10/09/2016   Procedure: CATARACT EXTRACTION PHACO AND INTRAOCULAR LENS PLACEMENT (IOC);  Surgeon: Lockie Molahadwick Brasington, MD;  [facility];  Service: Ophthalmology;  Laterality: Right;  sleep apnea   CHONDROPLASTY Right 04/29/2016   Procedure: CHONDROPLASTY;  Surgeon: Donato HeinzJames P Hooten, MD;  Location: [facility];   Service: Orthopedics;  Laterality: Right;   COLONOSCOPY WITH PROPOFOL N/A 10/24/2018   Procedure: COLONOSCOPY WITH PROPOFOL;  Surgeon: Pasty Spillersahiliani, Varnita B, MD;  Location: [facility];  Service: Endoscopy;  Laterality: N/A;   EYE SURGERY     KNEE ARTHROPLASTY Right 03/25/2018   Procedure: COMPUTER ASSISTED TOTAL KNEE ARTHROPLASTY;  Surgeon: Donato HeinzHooten, James P, MD;  Location: [facility];  Service: Orthopedics;  Laterality: Right;   KNEE ARTHROSCOPY WITH LATERAL MENISECTOMY  04/29/2016   Procedure: KNEE ARTHROSCOPY WITH LATERAL MENISECTOMY;  Surgeon: Donato HeinzJames P Hooten, MD;  Location: [facility];  Service: Orthopedics;;   KNEE ARTHROSCOPY WITH MEDIAL MENISECTOMY  04/29/2016   Procedure: KNEE ARTHROSCOPY WITH MEDIAL MENISECTOMY;  Surgeon: Donato HeinzJames P Hooten, MD;  Location: [facility];  Service: Orthopedics;;   REDUCTION MAMMAPLASTY Bilateral 09/2016   RETINAL DETACHMENT SURGERY Left May 03, 2015   Dr. In[facility]asington[facility]   TUBAL LIGATION      Allergies  Allergies  Allergen Reactions   Apple Anaphylaxis    Throat swells but subsides with po benadry   Daucus Carota Anaphylaxis    Peeling of carrot only but subsides with po benadry   Ivp Dye [Iodinated Diagnostic Agents] Shortness Of Breath    Also swelling.  Topical betadine is OK.   Other Swelling    Raw fruits cause throat to swell. Topical contact with carrots cause hands to swell, ok to eat. Pt can eat citrus and blueberries with no problems  Strawberry (Diagnostic) Anaphylaxis    Throat swells but subsides with benadryl   Strawberry Extract Anaphylaxis    Throat swells but subsides with benadryl   Iodine Swelling    IV    Tape Other (See Comments)    Most tapes case raw skin.  Paper tape is OK.    History of Present Illness    Crystal Haas is a 74 y.o. female with a hx of atrial fibrillation on chronic anticoagulation, HTN, OSA on CPAP last seen by Dr. Kirke Corin 09/02/19.  Echocardiogram 04/2015 with normal LVEF and mildly elevated  bilateral atria. She was hospitalized 10/2018 for lower GI bleed - her colonoscopy showed multiple diverticula and no active bleeding. Her Eliquis was interrupted for a few days and resumed with no issues.  She was seen in the ED 04/28/20 with chest wall pain. Troponins were negative, EKG without acute changes.  Lexican Myoview 05/10/20 with no evidence of ischemia, EF 55-65%, low risk study. She sent subsequent message noting labile blood pressures.   She takes her Losartan 25 mg every evening.  She reports very labile blood pressures.  She is checking her blood pressure at various times throughout the day.  Often times up to 4 times per day.  We reviewed the blood pressure is naturally labile throughout the day and she should check her blood pressure at a consistent time at least 2 hours after medications.  Blood pressure at home appears to be averaging 140s over 80s.  She does use her wrist cuff and while it was close to our manual reading we discussed that these are often not as accurate.  Tells me over the weekend she had a headache.  She was not sure whether this was related to high blood pressure.  It has since resolved.  Reports some lightheadedness. It is intermittent.  Does not occur daily, no near syncope nor syncope.  We reviewed the low blood pressure might cause some lightheadedness.  She did have 1 low blood pressure after doubling her dose of losartan.  Drinks only water. Eats no salt.   No formal exercise routine. Used to do a lot of walking and swimming, but has been less active in the setting of the pandemic.  Encourage regular exercise.    EKGs/Labs/Other Studies Reviewed:   The following studies were reviewed today: Lexiscan myoview 05/10/20  There was no ST segment deviation noted during stress.  The study is normal.  This is a low risk study.  The left ventricular ejection fraction is normal (55-65%).  There is no evidence for ischemia    EKG:  EKG is ordered today.   The ekg ordered today demonstrates rate controlled atrial fibrillation 72 bpm with nonspecific T wave changes (TWI lead III, aVR, V1).  Recent Labs: 07/26/2019: ALT 17; TSH 2.96 04/28/2020: BUN 15; Creatinine, Ser 0.99; Hemoglobin 14.4; Platelets 183; Potassium 4.1; Sodium 141  Recent Lipid Panel    Component Value Date/Time   CHOL 147 10/24/2017 1230   CHOL 186 09/04/2015 1051   TRIG 95.0 10/24/2017 1230   HDL 59.60 10/24/2017 1230   HDL 66 09/04/2015 1051   CHOLHDL 2 10/24/2017 1230   VLDL 19.0 10/24/2017 1230   LDLCALC 69 10/24/2017 1230   LDLCALC 99 09/04/2015 1051   LDLDIRECT 60.0 09/30/2016 1129    Home Medications   Current Meds  Medication Sig   apixaban (ELIQUIS) 5 MG TABS tablet Take 1 tablet (5 mg total) by mouth 2 (two) times daily.   atorvastatin (  LIPITOR) 20 MG tablet TAKE 1 TABLET BY MOUTH ONCE DAILY   B Complex-C (B-COMPLEX WITH VITAMIN C) tablet Take 1 tablet by mouth daily.   cetirizine (ZYRTEC) 10 MG tablet Take 1 tablet (10 mg total) by mouth daily.   EPIPEN 2-PAK 0.3 MG/0.3ML SOAJ injection Inject 0.3 mLs (0.3 mg total) into the muscle as directed. AS NEEDED FOR ANAPHYLAXIS   fluticasone (FLONASE) 50 MCG/ACT nasal spray Place 2 sprays into both nostrils daily.   GLUCOSAMINE-CHONDROITIN DS PO Take 3,000 mg by mouth daily.   losartan (COZAAR) 25 MG tablet Take 1 tablet (25 mg total) by mouth daily.   montelukast (SINGULAIR) 10 MG tablet Take 10 mg by mouth at bedtime.    Multiple Vitamin (MULTIVITAMIN WITH MINERALS) TABS tablet Take 1 tablet by mouth daily. One-A-Day Active 65+   NON FORMULARY 10 each by Other route daily. GIN SOAKED RAISINS FOR PAIN RELIEF   Olopatadine HCl (PATADAY) 0.2 % SOLN Place 1 drop into both eyes daily.   Papaya CHEW Chew 3-4 each by mouth as needed.   sertraline (ZOLOFT) 50 MG tablet Take 1 tablet (50 mg total) by mouth daily.    Review of Systems    Review of Systems  Constitutional: Negative for chills, fever and  malaise/fatigue.  Cardiovascular: Negative for chest pain, dyspnea on exertion, irregular heartbeat, leg swelling, near-syncope, orthopnea, palpitations and syncope.  Respiratory: Negative for cough, shortness of breath and wheezing.   Gastrointestinal: Negative for melena, nausea and vomiting.  Genitourinary: Negative for hematuria.  Neurological: Positive for light-headedness. Negative for dizziness and weakness.  Psychiatric/Behavioral: The patient has insomnia.    All other systems reviewed and are otherwise negative except as noted above.  Physical Exam    VS:  BP (!) 160/84 (BP Location: Left Arm, Patient Position: Sitting, Cuff Size: Large)    Pulse 72    Ht 5' (1.524 m)    Wt 235 lb 2 oz (106.7 kg)    SpO2 97%    BMI 45.92 kg/m  , BMI Body mass index is 45.92 kg/m. GEN: Well nourished, overweight, well developed, in no acute distress. HEENT: normal. Neck: Supple, no JVD, carotid bruits, or masses. Cardiac: Irregularly irregular, no murmurs, rubs, or gallops. No clubbing, cyanosis, edema.  Radials/DP/PT 2+ and equal bilaterally.  Respiratory:  Respirations regular and unlabored, clear to auscultation bilaterally. GI: Soft, nontender, nondistended, BS + x 4. MS: No deformity or atrophy. Skin: Warm and dry, no rash. Neuro:  Strength and sensation are intact. Psych: Normal affect.  Assessment & Plan    1. Chest pain - 04/2020 low risk lexiscan.  No recurrent chest pain, pressure, tightness.  No indication for ischemic eval at this time.  EKG today with no acute ST/T wave changes.  Continue primary prevention with statin.  No aspirin secondary to chronic anticoagulation.  Low-sodium, heart healthy diet encouraged.  Regular cardiovascular exercise encouraged.  2. Atrial fibrillation/anticoagulation -atrial fibrillation rate controlled by EKG today.  Her atrial fibrillation is asymptomatic.  She denies bleeding complications, continue Eliquis 5 mg twice daily.  3. HTN -BP labile at  home readings 90/60 -200/90 likely due to utilization of a wrist cuff and checking multiple times per day.  She was encouraged to check once per day after sitting for 10 minutes at least 2 hours after her medications.  Her blood pressure on average at home is 140s over 80s.  Her initial BP reading today in clinic was 160/84 with a repeat reading without intervention 138/82.  Increase Losartan to 37.5mg  every evening.  Careful up titration of antihypertensive therapy to prevent lightheadedness.  4. HLD -continue atorvastatin 20 mg daily.  5. Insomnia -reports difficulty sleeping over the last 4 days.  Previously was on Ambien with good response.  Encouraged to follow-up with her primary care provider.  Disposition: Follow up in 3 week(s) with Dr. Kirke Corin or APP for reassessment of blood pressure and BMP for monitoring.  Alver Sorrow, NP 05/29/2020, 9:45 AM

## 2020-05-29 ENCOUNTER — Ambulatory Visit: Payer: Medicare HMO | Admitting: Family

## 2020-05-29 ENCOUNTER — Encounter: Payer: Self-pay | Admitting: Family

## 2020-05-29 ENCOUNTER — Other Ambulatory Visit: Payer: Self-pay

## 2020-05-29 VITALS — BP 138/82 | HR 72 | Ht 60.0 in | Wt 235.1 lb

## 2020-05-29 DIAGNOSIS — I10 Essential (primary) hypertension: Secondary | ICD-10-CM | POA: Diagnosis not present

## 2020-05-29 DIAGNOSIS — I482 Chronic atrial fibrillation, unspecified: Secondary | ICD-10-CM | POA: Diagnosis not present

## 2020-05-29 DIAGNOSIS — Z7901 Long term (current) use of anticoagulants: Secondary | ICD-10-CM | POA: Diagnosis not present

## 2020-05-29 DIAGNOSIS — E782 Mixed hyperlipidemia: Secondary | ICD-10-CM | POA: Diagnosis not present

## 2020-05-29 MED ORDER — LOSARTAN POTASSIUM 25 MG PO TABS: 37.5000 mg | ORAL_TABLET | Freq: Every day | ORAL | 1 refills | Status: DC

## 2020-05-29 NOTE — Patient Instructions (Addendum)
Medication Instructions:  Your physician has recommended you make the following change in your medication:   CHANGE Losartan to 37.5mg  (1.5 tablets) daily  *If you need a refill on your cardiac medications before your next appointment, please call your pharmacy*  Lab Work: No lab work today.  Testing/Procedures: Your EKG today shows rate controlled atrial fibrillation.  Follow-Up: At [facility], you and your health needs are our priority.  As part of our continuing mission to provide you with exceptional heart care, we have created designated Provider Care Teams.  These Care Teams include your primary Cardiologist (physician) and Advanced Practice Providers (APPs -  Physician Assistants and Nurse Practitioners) who all work together to provide you with the care you need, when you need it.  We recommend signing up for the patient portal called "MyChart".  Sign up information is provided on this After Visit Summary.  MyChart is used to connect with patients for Virtual Visits (Telemedicine).  Patients are able to view lab/test results, encounter notes, upcoming appointments, etc.  Non-urgent messages can be sent to your provider as well.   To learn more about what you can do with MyChart, go to [email or web address].    Your next appointment:   3 week(s)  The format for your next appointment:   In Person  Provider:    You may see Lorine Bears, MD or one of the following Advanced Practice Providers on your designated Care Team:    Nicolasa Ducking, NP  Eula Listen, PA-C  Gillian Shields, NP  Marisue Ivan, PA-C  Other Instructions  Take your Blood pressure and heart rate once a day in the afternoon.  Tips to Measure your Blood Pressure Correctly  To determine whether you have hypertension, a medical professional will take a blood pressure reading. How you prepare for the test, the position of your arm, and other factors can change a blood pressure reading by 10% or  more. That could be enough to hide high blood pressure, start you on a drug you don't really need, or lead your doctor to incorrectly adjust your medications.  National and international guidelines offer specific instructions for measuring blood pressure. If a doctor, nurse, or medical assistant isn't doing it right, don't hesitate to ask him or her to get with the guidelines.  Here's what you can do to ensure a correct reading: . Don't drink a caffeinated beverage or smoke during the 30 minutes before the test. . Sit quietly for five minutes before the test begins. . During the measurement, sit in a chair with your feet on the floor and your arm supported so your elbow is at about heart level. . The inflatable part of the cuff should completely cover at least 80% of your upper arm, and the cuff should be placed on bare skin, not over a shirt. . Don't talk during the measurement. . Have your blood pressure measured twice, with a brief break in between. If the readings are different by 5 points or more, have it done a third time.  There are times to break these rules. If you sometimes feel lightheaded when getting out of bed in the morning or when you stand after sitting, you should have your blood pressure checked while seated and then while standing to see if it falls from one position to the next.  In 2017, new guidelines from the American Heart Association, the [employer] of Cardiology, and nine other health organizations lowered the diagnosis of high blood pressure to  130/80 mm Hg or higher for all adults. The guidelines also redefined the various blood pressure categories to now include normal, elevated, Stage 1 hypertension, Stage 2 hypertension, and hypertensive crisis (see "Blood pressure categories").  Blood pressure categories  Blood pressure category SYSTOLIC (upper number)  DIASTOLIC (lower number)  Normal Less than 120 mm Hg and Less than 80 mm Hg  Elevated 120-129 mm Hg and  Less than 80 mm Hg  High blood pressure: Stage 1 hypertension 130-139 mm Hg or 80-89 mm Hg  High blood pressure: Stage 2 hypertension 140 mm Hg or higher or 90 mm Hg or higher  Hypertensive crisis (consult your doctor immediately) Higher than 180 mm Hg and/or Higher than 120 mm Hg  Source: American Heart Association and American Stroke Association. For more on getting your blood pressure under control, buy Controlling Your Blood Pressure, a Special Health Report from [facility].   Blood Pressure Log   Date   Time  Blood Pressure  Position  Example: Nov 1 9 AM 124/78 sitting

## 2020-05-30 DIAGNOSIS — J301 Allergic rhinitis due to pollen: Secondary | ICD-10-CM | POA: Diagnosis not present

## 2020-05-31 ENCOUNTER — Encounter: Payer: Self-pay | Admitting: Family

## 2020-06-06 DIAGNOSIS — J301 Allergic rhinitis due to pollen: Secondary | ICD-10-CM | POA: Diagnosis not present

## 2020-06-08 ENCOUNTER — Encounter: Payer: Self-pay | Admitting: Family

## 2020-06-08 MED ORDER — LOSARTAN POTASSIUM 25 MG PO TABS: 25.0000 mg | ORAL_TABLET | Freq: Every day | ORAL | 1 refills | Status: DC

## 2020-06-13 DIAGNOSIS — J301 Allergic rhinitis due to pollen: Secondary | ICD-10-CM | POA: Diagnosis not present

## 2020-06-19 ENCOUNTER — Encounter: Payer: Self-pay | Admitting: Family Medicine

## 2020-06-19 ENCOUNTER — Ambulatory Visit: Payer: Medicare HMO | Admitting: Family

## 2020-06-20 DIAGNOSIS — J301 Allergic rhinitis due to pollen: Secondary | ICD-10-CM | POA: Diagnosis not present

## 2020-06-25 DIAGNOSIS — G4733 Obstructive sleep apnea (adult) (pediatric): Secondary | ICD-10-CM | POA: Diagnosis not present

## 2020-06-27 DIAGNOSIS — J301 Allergic rhinitis due to pollen: Secondary | ICD-10-CM | POA: Diagnosis not present

## 2020-07-17 DIAGNOSIS — G4733 Obstructive sleep apnea (adult) (pediatric): Secondary | ICD-10-CM | POA: Diagnosis not present

## 2020-07-25 DIAGNOSIS — G4733 Obstructive sleep apnea (adult) (pediatric): Secondary | ICD-10-CM | POA: Diagnosis not present

## 2020-07-25 DIAGNOSIS — J301 Allergic rhinitis due to pollen: Secondary | ICD-10-CM | POA: Diagnosis not present

## 2020-08-01 DIAGNOSIS — J301 Allergic rhinitis due to pollen: Secondary | ICD-10-CM | POA: Diagnosis not present

## 2020-08-08 DIAGNOSIS — J301 Allergic rhinitis due to pollen: Secondary | ICD-10-CM | POA: Diagnosis not present

## 2020-08-16 DIAGNOSIS — J301 Allergic rhinitis due to pollen: Secondary | ICD-10-CM | POA: Diagnosis not present

## 2020-08-22 DIAGNOSIS — J301 Allergic rhinitis due to pollen: Secondary | ICD-10-CM | POA: Diagnosis not present

## 2020-08-23 ENCOUNTER — Encounter: Payer: Self-pay | Admitting: Family Medicine

## 2020-08-23 DIAGNOSIS — G4733 Obstructive sleep apnea (adult) (pediatric): Secondary | ICD-10-CM | POA: Diagnosis not present

## 2020-08-24 NOTE — Telephone Encounter (Signed)
These are filled by another provider are these ok to fill.

## 2020-08-29 DIAGNOSIS — J301 Allergic rhinitis due to pollen: Secondary | ICD-10-CM | POA: Diagnosis not present

## 2020-08-30 ENCOUNTER — Other Ambulatory Visit: Payer: Self-pay

## 2020-08-30 ENCOUNTER — Encounter: Payer: Self-pay | Admitting: Family

## 2020-08-30 ENCOUNTER — Ambulatory Visit: Payer: Medicare HMO | Admitting: Family

## 2020-08-30 VITALS — BP 120/80 | HR 75 | Ht 65.0 in | Wt 234.0 lb

## 2020-08-30 DIAGNOSIS — I10 Essential (primary) hypertension: Secondary | ICD-10-CM

## 2020-08-30 DIAGNOSIS — Z7901 Long term (current) use of anticoagulants: Secondary | ICD-10-CM | POA: Diagnosis not present

## 2020-08-30 DIAGNOSIS — E782 Mixed hyperlipidemia: Secondary | ICD-10-CM | POA: Diagnosis not present

## 2020-08-30 DIAGNOSIS — I482 Chronic atrial fibrillation, unspecified: Secondary | ICD-10-CM | POA: Diagnosis not present

## 2020-08-30 MED ORDER — LOSARTAN POTASSIUM 25 MG PO TABS: 25.0000 mg | ORAL_TABLET | Freq: Every day | ORAL | 3 refills | Status: DC

## 2020-08-30 MED ORDER — ATORVASTATIN CALCIUM 20 MG PO TABS: ORAL_TABLET | ORAL | 3 refills | Status: DC

## 2020-08-30 MED ORDER — APIXABAN 5 MG PO TABS: 5.0000 mg | ORAL_TABLET | Freq: Two times a day (BID) | ORAL | 11 refills | Status: DC

## 2020-08-30 NOTE — Patient Instructions (Signed)
Medication Instructions:  No medication changes today.   *If you need a refill on your cardiac medications before your next appointment, please call your pharmacy*   Lab Work: None ordered today.  Testing/Procedures: None ordered today.   Follow-Up: At [facility], you and your health needs are our priority.  As part of our continuing mission to provide you with exceptional heart care, we have created designated Provider Care Teams.  These Care Teams include your primary Cardiologist (physician) and Advanced Practice Providers (APPs -  Physician Assistants and Nurse Practitioners) who all work together to provide you with the care you need, when you need it.  We recommend signing up for the patient portal called "MyChart".  Sign up information is provided on this After Visit Summary.  MyChart is used to connect with patients for Virtual Visits (Telemedicine).  Patients are able to view lab/test results, encounter notes, upcoming appointments, etc.  Non-urgent messages can be sent to your provider as well.   To learn more about what you can do with MyChart, go to [email or web address].    Your next appointment:   1 year(s)  The format for your next appointment:   In Person  Provider:   You may see Lorine Bears, MD or one of the following Advanced Practice Providers on your designated Care Team:    Nicolasa Ducking, NP  Eula Listen, PA-C  Marisue Ivan, PA-C  Gillian Shields, NP  Cadence Fransico Michael, [place]

## 2020-08-30 NOTE — Progress Notes (Signed)
Office Visit    Patient Name: Crystal Haas Date of Encounter: 08/30/2020  Primary Care Provider:  Glori Luis, MD Primary Cardiologist:  Lorine Bears, MD Electrophysiologist:  None   Chief Complaint    Crystal Haas is a 74 y.o. female with a hx of atrial fibrillation on chronic anticoagulation, HTN, OSA on CPAP presents today for follow up of blood pressure.   Past Medical History    Past Medical History:  Diagnosis Date  . Anemia    distant past  . Anxiety   . Arthritis    "everywhere" - big toes worst  . Chronic atrial fibrillation (HCC)    a. on eliquis; b. CHADS2VASc at least 2 (age x 1, female)  . Depression   . GERD (gastroesophageal reflux disease)    RARE  . Heart murmur    mild - followed by PCP  . Knee pain   . Motion sickness    back seat of car  . OSA on CPAP    CPAP-4 PSI  . 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 Seasonal allergies    takes allergy weekly  . Status post bilateral breast reduction 09/06/2016   Overview:  08/29/16   Past Surgical History:  Procedure Laterality Date  . BREAST REDUCTION SURGERY Bilateral 08/29/2016   Procedure: BILATERAL MAMMARY REDUCTION  (BREAST)WITH LIPOSUCTION;  Surgeon: Peggye Form, DO;  Location: [facility];  Service: Plastics;  Laterality: Bilateral;  . CATARACT EXTRACTION W/PHACO Left 05/03/2015   Procedure: CATARACT EXTRACTION PHACO AND INTRAOCULAR LENS PLACEMENT (IOC);  Surgeon: Lockie Mola, MD;  Location: [facility];  Service: Ophthalmology;  Laterality: Left;  CPAP  . CATARACT EXTRACTION W/PHACO Right 10/09/2016   Procedure: CATARACT EXTRACTION PHACO AND INTRAOCULAR LENS PLACEMENT (IOC);  Surgeon: Lockie Mola, MD;  Location: [facility];  Service: Ophthalmology;  Laterality: Right;  sleep apnea  . CHONDROPLASTY Right 04/29/2016   Procedure: CHONDROPLASTY;  Surgeon: Donato Heinz, MD;  Location: [facility];  Service: Orthopedics;  Laterality: Right;    . COLONOSCOPY WITH PROPOFOL N/A 10/24/2018   Procedure: COLONOSCOPY WITH PROPOFOL;  Surgeon: Pasty Spillers, MD;  Location: [facility];  Service: Endoscopy;  Laterality: N/A;  . EYE SURGERY    . KNEE ARTHROPLASTY Right 03/25/2018   Procedure: COMPUTER ASSISTED TOTAL KNEE ARTHROPLASTY;  Surgeon: Donato Heinz, MD;  Location: [facility];  Service: Orthopedics;  Laterality: Right;  . KNEE ARTHROSCOPY WITH LATERAL MENISECTOMY  04/29/2016   Procedure: KNEE ARTHROSCOPY WITH LATERAL MENISECTOMY;  Surgeon: Donato Heinz, MD;  Location: [facility];  Service: Orthopedics;;  . KNEE ARTHROSCOPY WITH MEDIAL MENISECTOMY  04/29/2016   Procedure: KNEE ARTHROSCOPY WITH MEDIAL MENISECTOMY;  Surgeon: Donato Heinz, MD;  Location: [facility];  Service: Orthopedics;;  . REDUCTION MAMMAPLASTY Bilateral 09/2016  . RETINAL DETACHMENT SURGERY Left May 03, 2015   Dr. Inez Pilgrim, [facility]  . TUBAL LIGATION     Allergies  Allergies  Allergen Reactions  . Ivp Dye [Iodinated Diagnostic Agents] Shortness Of Breath    Also swelling.  Topical betadine is OK.  . Iodine Swelling    IV   . Tape Other (See Comments)    Most tapes case raw skin.  Paper tape is OK.    History of Present Illness    Crystal Haas is a 74 y.o. female with a hx of atrial fibrillation on chronic anticoagulation, HTN, OSA on CPAP last seen 05/29/2020.  Echocardiogram 04/2015 with normal LVEF and mildly elevated  bilateral atria. She was hospitalized 10/2018 for lower GI bleed - her colonoscopy showed multiple diverticula and no active bleeding. Her Eliquis was interrupted for a few days and resumed with no issues.  She was seen in the ED 04/28/20 with chest wall pain. Troponins were negative, EKG without acute changes.  Lexican Myoview 05/10/20 with no evidence of ischemia, EF 55-65%, low risk study. She sent subsequent message noting labile blood pressures.  In clinic 05/29/2020 her losartan was increased to 37.5 mg daily subsequently reduced back to 25  mg daily due to lightheadedness.  She presents today for follow-up.  She reports no chest pain, pressure, tightness.  Reports her heart rate and blood pressure has been well controlled at home.  She denies bleeding complications on Eliquis.  She is planning to go visit her great grandson and stay with him for a few days this weekend and is very excited about her trip.  EKGs/Labs/Other Studies Reviewed:   The following studies were reviewed today: Lexiscan myoview 05/10/20  There was no ST segment deviation noted during stress.  The study is normal.  This is a low risk study.  The left ventricular ejection fraction is normal (55-65%).  There is no evidence for ischemia    EKG: No EKG ordered today.  EKG independently reviewed from 05/29/2020 demonstrated  rate controlled atrial fibrillation 72 bpm with nonspecific T wave changes (TWI lead III, aVR, V1).  Recent Labs: 04/28/2020: BUN 15; Creatinine, Ser 0.99; Hemoglobin 14.4; Platelets 183; Potassium 4.1; Sodium 141  Recent Lipid Panel    Component Value Date/Time   CHOL 147 10/24/2017 1230   CHOL 186 09/04/2015 1051   TRIG 95.0 10/24/2017 1230   HDL 59.60 10/24/2017 1230   HDL 66 09/04/2015 1051   CHOLHDL 2 10/24/2017 1230   VLDL 19.0 10/24/2017 1230   LDLCALC 69 10/24/2017 1230   LDLCALC 99 09/04/2015 1051   LDLDIRECT 60.0 09/30/2016 1129    Home Medications   Current Meds  Medication Sig  . apixaban (ELIQUIS) 5 MG TABS tablet Take 1 tablet (5 mg total) by mouth 2 (two) times daily.  Marland Kitchen atorvastatin (LIPITOR) 20 MG tablet TAKE 1 TABLET BY MOUTH ONCE DAILY  . B Complex-C (B-COMPLEX WITH VITAMIN C) tablet Take 1 tablet by mouth daily.  . cetirizine (ZYRTEC) 10 MG tablet Take 1 tablet (10 mg total) by mouth daily.  Marland Kitchen EPIPEN 2-PAK 0.3 MG/0.3ML SOAJ injection Inject 0.3 mLs (0.3 mg total) into the muscle as directed. AS NEEDED FOR ANAPHYLAXIS  . fluticasone (FLONASE) 50 MCG/ACT nasal spray Place 2 sprays into both nostrils daily.   Marland Kitchen GLUCOSAMINE-CHONDROITIN DS PO Take 3,000 mg by mouth daily.  Marland Kitchen losartan (COZAAR) 25 MG tablet Take 1 tablet (25 mg total) by mouth daily.  . montelukast (SINGULAIR) 10 MG tablet Take 10 mg by mouth at bedtime.   . Multiple Vitamin (MULTIVITAMIN WITH MINERALS) TABS tablet Take 1 tablet by mouth daily. One-A-Day Active 65+  . NON FORMULARY 10 each by Other route daily. GIN SOAKED RAISINS FOR PAIN RELIEF  . Olopatadine HCl (PATADAY) 0.2 % SOLN Place 1 drop into both eyes daily.  Trellis Moment CHEW Chew 3-4 each by mouth as needed.  . sertraline (ZOLOFT) 50 MG tablet Take 1 tablet (50 mg total) by mouth daily.    Review of Systems    All other systems reviewed and are otherwise negative except as noted above.  Physical Exam    VS:  BP 120/80 (BP Location: Left Arm,  Patient Position: Sitting, Cuff Size: Large)   Pulse 75   Ht 5\' 5"  (1.651 m)   Wt 234 lb (106.1 kg)   SpO2 97%   BMI 38.94 kg/m  , BMI Body mass index is 38.94 kg/m. GEN: Well nourished, overweight, well developed, in no acute distress. HEENT: normal. Neck: Supple, no JVD, carotid bruits, or masses. Cardiac: Irregularly irregular, no murmurs, rubs, or gallops. No clubbing, cyanosis, edema.  Radials/DP/PT 2+ and equal bilaterally.  Respiratory:  Respirations regular and unlabored, clear to auscultation bilaterally. GI: Soft, nontender, nondistended, BS + x 4. MS: No deformity or atrophy. Skin: Warm and dry, no rash. Neuro:  Strength and sensation are intact. Psych: Normal affect.  Assessment & Plan    1. Atrial fibrillation/anticoagulation - Atrial fibrillation rate controlled today without use of AV nodal blocking agent..  Her atrial fibrillation is asymptomatic.  She denies bleeding complications, continue Eliquis 5 mg twice daily due to CHA2DS2-VASc score of at least 3 (HTN, age, gender).   2. HTN - BP well controlled. Continue current antihypertensive regimen of losartan 25 mg daily.  3. HLD -Continue atorvastatin 20  mg daily.  She is due for repeat lipid panel with her next lab collection but will defer today as she is not fasting.  Disposition: Follow up in 1 year(s) with Dr. or APP   Kirke Corin, NP 08/30/2020, 2:48 PM

## 2020-09-05 DIAGNOSIS — J301 Allergic rhinitis due to pollen: Secondary | ICD-10-CM | POA: Diagnosis not present

## 2020-09-09 ENCOUNTER — Emergency Department
Admission: EM | Admit: 2020-09-09 | Discharge: 2020-09-09 | Disposition: A | Discharge status: Home / Self Care | Payer: Medicare HMO | Attending: Emergency Medicine | Admitting: Emergency Medicine

## 2020-09-09 ENCOUNTER — Other Ambulatory Visit: Payer: Self-pay

## 2020-09-09 DIAGNOSIS — Z87891 Personal history of nicotine dependence: Secondary | ICD-10-CM | POA: Insufficient documentation

## 2020-09-09 DIAGNOSIS — I10 Essential (primary) hypertension: Secondary | ICD-10-CM | POA: Diagnosis not present

## 2020-09-09 DIAGNOSIS — R5381 Other malaise: Secondary | ICD-10-CM | POA: Diagnosis not present

## 2020-09-09 DIAGNOSIS — K529 Noninfective gastroenteritis and colitis, unspecified: Secondary | ICD-10-CM

## 2020-09-09 DIAGNOSIS — Z96651 Presence of right artificial knee joint: Secondary | ICD-10-CM | POA: Insufficient documentation

## 2020-09-09 DIAGNOSIS — Z7901 Long term (current) use of anticoagulants: Secondary | ICD-10-CM | POA: Insufficient documentation

## 2020-09-09 DIAGNOSIS — R197 Diarrhea, unspecified: Secondary | ICD-10-CM | POA: Diagnosis present

## 2020-09-09 DIAGNOSIS — R112 Nausea with vomiting, unspecified: Secondary | ICD-10-CM | POA: Diagnosis not present

## 2020-09-09 LAB — CBC
HCT: 43.4 % (ref 36.0–46.0)
Hemoglobin: 14.4 g/dL (ref 12.0–15.0)
MCH: 30.3 pg (ref 26.0–34.0)
MCHC: 33.2 g/dL (ref 30.0–36.0)
MCV: 91.2 fL (ref 80.0–100.0)
Platelets: 179 10*3/uL (ref 150–400)
RBC: 4.76 MIL/uL (ref 3.87–5.11)
RDW: 13.3 % (ref 11.5–15.5)
WBC: 13 10*3/uL — ABNORMAL HIGH (ref 4.0–10.5)
nRBC: 0 % (ref 0.0–0.2)

## 2020-09-09 LAB — COMPREHENSIVE METABOLIC PANEL
ALT: 30 U/L (ref 0–44)
AST: 30 U/L (ref 15–41)
Albumin: 4 g/dL (ref 3.5–5.0)
Alkaline Phosphatase: 61 U/L (ref 38–126)
Anion gap: 13 (ref 5–15)
BUN: 18 mg/dL (ref 8–23)
CO2: 25 mmol/L (ref 22–32)
Calcium: 8.8 mg/dL — ABNORMAL LOW (ref 8.9–10.3)
Chloride: 101 mmol/L (ref 98–111)
Creatinine, Ser: 0.91 mg/dL (ref 0.44–1.00)
GFR, Estimated: >60 mL/min (ref >60)
Glucose, Bld: 167 mg/dL — ABNORMAL HIGH (ref 70–99)
Potassium: 4.2 mmol/L (ref 3.5–5.1)
Sodium: 139 mmol/L (ref 135–145)
Total Bilirubin: 1 mg/dL (ref 0.3–1.2)
Total Protein: 7 g/dL (ref 6.5–8.1)

## 2020-09-09 LAB — LIPASE, BLOOD: Lipase: 23 U/L (ref 11–51)

## 2020-09-09 MED ORDER — SODIUM CHLORIDE 0.9 % IV BOLUS
1000.0000 mL | Freq: Once | INTRAVENOUS | Status: AC
  Administered 2020-09-09: 1000 mL via INTRAVENOUS

## 2020-09-09 MED ORDER — ONDANSETRON HCL 4 MG/2ML IJ SOLN
4.0000 mg | Freq: Once | INTRAMUSCULAR | Status: AC
  Administered 2020-09-09: 4 mg via INTRAVENOUS
  Filled 2020-09-09: qty 2

## 2020-09-09 NOTE — ED Notes (Signed)
Per EDP, pt trying PO challenge with water and crackers.

## 2020-09-09 NOTE — ED Notes (Signed)
Provided warm blankets to pt. Helped pt reposition in bed with pillow.

## 2020-09-09 NOTE — ED Notes (Addendum)
Pt to ED stating had vomiting that started at midnight (8h ago) that was "continuous" and then diarrhea that started at 0500 (more times than she could count). States 6/10 burning pain in throat that started this morning and thinks is from "vomiting acid". Denies covid exposure. Pt was having regular normal BMs until this morning. Denies blood in stool. Denies urinary symptoms.

## 2020-09-09 NOTE — ED Triage Notes (Signed)
Patient reports nausea, vomiting and diarrhea for the past several hours.

## 2020-09-09 NOTE — ED Notes (Signed)
EDP aware of T 99.5 oral. Pt to be discharged.

## 2020-09-09 NOTE — ED Provider Notes (Signed)
[facility] Emergency Department Provider Note ____________________________________________   First MD Initiated Contact with Patient 09/09/20 [phone]     (approximate)  I have reviewed the triage vital signs and the nursing notes.   HISTORY  Chief Complaint Emesis and Diarrhea    HPI Crystal Haas is a 74 y.o. female with PMH as noted below who presents with nausea and vomiting, acute onset around midnight, persistent since then, and associated with watery, nonbloody diarrhea that started several hours later.  The patient denies associated abdominal pain.  She has no fever or chills.  She does report a mild sore throat that started after the vomiting.  She has not been hospitalized recently, and denies any significant travel history.  She did not eat anything unusual yesterday.  Past Medical History:  Diagnosis Date  . Anemia    distant past  . Anxiety   . Arthritis    "everywhere" - big toes worst  . Chronic atrial fibrillation (HCC)    a. on eliquis; b. CHADS2VASc at least 2 (age x 1, female)  . Depression   . GERD (gastroesophageal reflux disease)    RARE  . Heart murmur    mild - followed by PCP  . Knee pain   . Motion sickness    back seat of car  . OSA on CPAP    CPAP-4 PSI  . 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 Seasonal allergies    takes allergy weekly  . Status post bilateral breast reduction 09/06/2016   Overview:  08/29/16    Patient Active Problem List   Diagnosis Date Noted  . Acute right ankle pain 07/27/2019  . Prediabetes 07/26/2019  . Rib pain on right side 11/03/2018  . Headache 11/03/2018  . Diverticulosis of large intestine without diverticulitis   . Lower GI bleed 10/23/2018  . Hematochezia   . Night sweats 04/20/2018  . Hand tingling 10/11/2017  . Morbid obesity (HCC) 10/11/2017  . Degenerative arthritis of right knee 07/17/2017  . Allergic rhinitis 09/02/2016  . Skin lesion 09/02/2016  . Change of skin color  08/06/2016  . Injury of right rotator cuff 02/15/2016  . Hyperkalemia 09/12/2015  . Chronic atrial fibrillation (HCC) 09/11/2015  . Sleep apnea 08/30/2015  . Depression 06/21/2015  . Chronic insomnia 05/30/2015  . Anxiety 05/27/2014  . Hypertension 05/27/2014  . Cataracts, bilateral 05/25/2014  . 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  . 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 Macular hole of left eye 05/24/2014  . 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 BREAST REDUCTION SURGERY Bilateral 08/29/2016   Procedure: BILATERAL MAMMARY REDUCTION  (BREAST)WITH LIPOSUCTION;  Surgeon: Peggye Formlaire S Dillingham, DO;  Location: [facility];  Service: Plastics;  Laterality: Bilateral;  . CATARACT EXTRACTION W/PHACO Left 05/03/2015   Procedure: CATARACT EXTRACTION PHACO AND INTRAOCULAR LENS PLACEMENT (IOC);  Surgeon: Lockie Molahadwick Brasington, MD;  [facility];  Service: Ophthalmology;  Laterality: Left;  CPAP  . CATARACT EXTRACTION W/PHACO Right 10/09/2016   Procedure: CATARACT EXTRACTION PHACO AND INTRAOCULAR LENS PLACEMENT (IOC);  Surgeon: Lockie Molahadwick Brasington, MD;  [facility];  Service: Ophthalmology;  Laterality: Right;  sleep apnea  . CHONDROPLASTY Right 04/29/2016   Procedure: CHONDROPLASTY;  Surgeon: Donato HeinzJames P Hooten, MD;  Location: [facility];  Service: Orthopedics;  Laterality: Right;  . COLONOSCOPY WITH PROPOFOL N/A 10/24/2018   Procedure: COLONOSCOPY WITH PROPOFOL;  Surgeon: Pasty Spillers, MD;  Location: [facility];  Service: Endoscopy;  Laterality: N/A;  . EYE SURGERY    . KNEE ARTHROPLASTY Right 03/25/2018   Procedure: COMPUTER ASSISTED TOTAL KNEE ARTHROPLASTY;  Surgeon: Donato Heinz, MD;  Location: [facility];  Service: Orthopedics;  Laterality: Right;  . KNEE ARTHROSCOPY WITH LATERAL MENISECTOMY  04/29/2016   Procedure: KNEE ARTHROSCOPY WITH  LATERAL MENISECTOMY;  Surgeon: Donato Heinz, MD;  Location: [facility];  Service: Orthopedics;;  . KNEE ARTHROSCOPY WITH MEDIAL MENISECTOMY  04/29/2016   Procedure: KNEE ARTHROSCOPY WITH MEDIAL MENISECTOMY;  Surgeon: Donato Heinz, MD;  Location: [facility];  Service: Orthopedics;;  . REDUCTION MAMMAPLASTY Bilateral 09/2016  . RETINAL DETACHMENT SURGERY Left May 03, 2015   Dr. Inez Pilgrim, [facility]  . TUBAL LIGATION      Prior to Admission medications   Medication Sig Start Date End Date Taking? Authorizing Provider  apixaban (ELIQUIS) 5 MG TABS tablet Take 1 tablet (5 mg total) by mouth 2 (two) times daily. 08/30/20   Alver Sorrow, NP  atorvastatin (LIPITOR) 20 MG tablet TAKE 1 TABLET BY MOUTH ONCE DAILY 08/30/20   Alver Sorrow, NP  B Complex-C (B-COMPLEX WITH VITAMIN C) tablet Take 1 tablet by mouth daily.    [provider]  cetirizine (ZYRTEC) 10 MG tablet Take 1 tablet (10 mg total) by mouth daily. 01/22/19   Glori Luis, MD  EPIPEN 2-PAK 0.3 MG/0.3ML SOAJ injection Inject 0.3 mLs (0.3 mg total) into the muscle as directed. AS NEEDED FOR ANAPHYLAXIS 01/22/19   Glori Luis, MD  fluticasone [facility]) 50 MCG/ACT nasal spray Place 2 sprays into both nostrils daily. 01/22/19   Glori Luis, MD  GLUCOSAMINE-CHONDROITIN DS PO Take 3,000 mg by mouth daily.    [provider]  losartan (COZAAR) 25 MG tablet Take 1 tablet (25 mg total) by mouth daily. 08/30/20   Alver Sorrow, NP  montelukast (SINGULAIR) 10 MG tablet Take 10 mg by mouth at bedtime.  08/20/19   [provider]  Multiple Vitamin (MULTIVITAMIN WITH MINERALS) TABS tablet Take 1 tablet by mouth daily. One-A-Day Active 65+    [provider]  NON FORMULARY 10 each by Other route daily. GIN SOAKED RAISINS FOR PAIN RELIEF    [provider]  Olopatadine HCl (PATADAY) 0.2 % SOLN Place 1 drop into both eyes daily.    [provider]  Papaya CHEW Chew 3-4 each by  mouth as needed.    [provider]  sertraline (ZOLOFT) 50 MG tablet Take 1 tablet (50 mg total) by mouth daily. 03/14/20   Glori Luis, MD    Allergies Ivp dye [iodinated diagnostic agents], Iodine, and Tape  Family History  Problem Relation Age of Onset  . Alcoholism Other   . Heart disease Other   . Breast cancer Neg Hx     Social History Social History   Tobacco Use  . Smoking status: Former Smoker    Packs/day: 0.25    Years: 2.00    Pack years: 0.50    Types: Cigarettes    Quit date: 10/21/1969    Years since quitting: 50.9  . Smokeless tobacco: Never Used  Vaping Use  . Vaping Use: Never used  Substance Use Topics  . Alcohol use: Not Currently    Comment: occasional glass of wine  . Drug use: No    Review of Systems  Constitutional: No fever. Eyes: No redness. ENT: Positive for sore  throat. Cardiovascular: Denies chest pain. Respiratory: Denies shortness of breath. Gastrointestinal: Positive for vomiting and diarrhea.  Genitourinary: Negative for dysuria.  Musculoskeletal: Negative for back pain. Skin: Negative for rash. Neurological: Negative for headache.   ____________________________________________   PHYSICAL EXAM:  VITAL SIGNS: ED Triage Vitals  Enc Vitals Group     BP 09/09/20 0607 133/76     Pulse Rate 09/09/20 0607 92     Resp 09/09/20 0607 18     Temp 09/09/20 0607 98.7 F (37.1 C)     Temp Source 09/09/20 0607 Oral     SpO2 09/09/20 0607 100 %     Weight 09/09/20 0602 232 lb (105.2 kg)     Height 09/09/20 0602 5\' 5"  (1.651 m)     Head Circumference --      Peak Flow --      Pain Score 09/09/20 0602 0     Pain Loc --      Pain Edu? --      Excl. in GC? --     Constitutional: Alert and oriented.  Relatively well appearing and in no acute distress. Eyes: Conjunctivae are normal.  No scleral icterus. Head: Atraumatic. Nose: No congestion/rhinnorhea. Mouth/Throat: Mucous membranes are somewhat dry.   Neck: Normal  range of motion.  Cardiovascular: Normal rate, regular rhythm.  Good peripheral circulation. Respiratory: Normal respiratory effort.  No retractions.  Gastrointestinal: Soft and nontender. No distention.  Genitourinary: No flank tenderness. Musculoskeletal: Extremities warm and well perfused.  Neurologic:  Normal speech and language. No gross focal neurologic deficits are appreciated.  Skin:  Skin is warm and dry. No rash noted. Psychiatric: Mood and affect are normal. Speech and behavior are normal.  ____________________________________________   LABS (all labs ordered are listed, but only abnormal results are displayed)  Labs Reviewed  COMPREHENSIVE METABOLIC PANEL - Abnormal; Notable for the following components:      Result Value   Glucose, Bld 167 (*)    Calcium 8.8 (*)    All other components within normal limits  CBC - Abnormal; Notable for the following components:   WBC 13.0 (*)    All other components within normal limits  LIPASE, BLOOD  URINALYSIS, COMPLETE (UACMP) WITH MICROSCOPIC   ____________________________________________  EKG  ED ECG REPORT I, 09/11/20, the attending physician, personally viewed and interpreted this ECG.  Date: 09/09/2020 EKG Time: 1019 Rate: 83 Rhythm: Atrial fibrillation QRS Axis: normal Intervals: normal ST/T Wave abnormalities: normal Narrative Interpretation: no evidence of acute ischemia  ____________________________________________  RADIOLOGY    ____________________________________________   PROCEDURES  Procedure(s) performed: No  Procedures  Critical Care performed: No ____________________________________________   INITIAL IMPRESSION / ASSESSMENT AND PLAN / ED COURSE  Pertinent labs & imaging results that were available during my care of the patient were reviewed by me and considered in my medical decision making (see chart for details).  74 year old female with PMH as noted above presents with  nausea, vomiting, and diarrhea acute onset around midnight.  The symptoms have now somewhat subsided since she has been in the ED over the last few hours.  She has no abdominal pain.  On exam, the patient is overall relatively well-appearing.  She has a borderline elevated temperature and heart rate, but otherwise normal vital signs.  The abdomen is soft and nontender.  Overall presentation is most consistent with gastroenteritis versus foodborne illness given the acute onset.  Lab work-up obtained from triage shows mild leukocytosis but is otherwise reassuring.  Given the  soft and nontender abdomen there is no evidence of colitis, diverticulitis, appendicitis, hepatobiliary cause, SBO, or other concerning etiology.  There is no indication for imaging at this time.  We will give fluids, antiemetics, and reassess.  ----------------------------------------- 1:43 PM on 09/09/2020 -----------------------------------------  The patient is feeling significantly better.  She has not had any further vomiting or diarrhea in the ED since my initial evaluation.  She continues to have no abdominal pain.  She was able to tolerate p.o.  At this time, she is stable for discharge home.  I counseled her on the results of the work-up and the likely etiology of her symptoms.  Return precautions given, and she expresses understanding.  ____________________________________________   FINAL CLINICAL IMPRESSION(S) / ED DIAGNOSES  Final diagnoses:  Gastroenteritis      NEW MEDICATIONS STARTED DURING THIS VISIT:  New Prescriptions   No medications on file     Note:  This document was prepared using Dragon voice recognition software and may include unintentional dictation errors.    Dionne Bucy, MD 09/09/20 1344

## 2020-09-09 NOTE — ED Notes (Signed)
Pt states would like to be discharged home.

## 2020-09-09 NOTE — ED Notes (Signed)
Pt drank 240 mL water and ate 1 pack saltine crackers. Denies nausea. Sitting resting in bed.

## 2020-09-12 DIAGNOSIS — J301 Allergic rhinitis due to pollen: Secondary | ICD-10-CM | POA: Diagnosis not present

## 2020-09-19 ENCOUNTER — Telehealth: Payer: Self-pay

## 2020-09-19 ENCOUNTER — Observation Stay: Payer: Medicare HMO

## 2020-09-19 ENCOUNTER — Emergency Department: Payer: Medicare HMO

## 2020-09-19 ENCOUNTER — Observation Stay
Admission: EM | Admit: 2020-09-19 | Discharge: 2020-09-20 | Disposition: A | Discharge status: Home / Self Care | Payer: Medicare HMO | Attending: Internal Medicine | Admitting: Internal Medicine

## 2020-09-19 ENCOUNTER — Other Ambulatory Visit: Payer: Self-pay

## 2020-09-19 ENCOUNTER — Observation Stay (HOSPITAL_BASED_OUTPATIENT_CLINIC_OR_DEPARTMENT_OTHER)
Admit: 2020-09-19 | Discharge: 2020-09-19 | Disposition: A | Discharge status: Home / Self Care | Payer: Medicare HMO | Attending: Internal Medicine | Admitting: Internal Medicine

## 2020-09-19 DIAGNOSIS — Z20822 Contact with and (suspected) exposure to covid-19: Secondary | ICD-10-CM | POA: Insufficient documentation

## 2020-09-19 DIAGNOSIS — Z7901 Long term (current) use of anticoagulants: Secondary | ICD-10-CM | POA: Insufficient documentation

## 2020-09-19 DIAGNOSIS — G9389 Other specified disorders of brain: Secondary | ICD-10-CM | POA: Diagnosis not present

## 2020-09-19 DIAGNOSIS — K219 Gastro-esophageal reflux disease without esophagitis: Secondary | ICD-10-CM | POA: Diagnosis present

## 2020-09-19 DIAGNOSIS — I6521 Occlusion and stenosis of right carotid artery: Secondary | ICD-10-CM | POA: Insufficient documentation

## 2020-09-19 DIAGNOSIS — E782 Mixed hyperlipidemia: Secondary | ICD-10-CM

## 2020-09-19 DIAGNOSIS — Z23 Encounter for immunization: Secondary | ICD-10-CM | POA: Insufficient documentation

## 2020-09-19 DIAGNOSIS — I10 Essential (primary) hypertension: Secondary | ICD-10-CM | POA: Insufficient documentation

## 2020-09-19 DIAGNOSIS — F32A Depression, unspecified: Secondary | ICD-10-CM | POA: Diagnosis present

## 2020-09-19 DIAGNOSIS — I6389 Other cerebral infarction: Secondary | ICD-10-CM

## 2020-09-19 DIAGNOSIS — I639 Cerebral infarction, unspecified: Principal | ICD-10-CM | POA: Insufficient documentation

## 2020-09-19 DIAGNOSIS — R29818 Other symptoms and signs involving the nervous system: Secondary | ICD-10-CM | POA: Diagnosis not present

## 2020-09-19 DIAGNOSIS — G8191 Hemiplegia, unspecified affecting right dominant side: Secondary | ICD-10-CM | POA: Insufficient documentation

## 2020-09-19 DIAGNOSIS — E669 Obesity, unspecified: Secondary | ICD-10-CM | POA: Diagnosis not present

## 2020-09-19 DIAGNOSIS — Z79899 Other long term (current) drug therapy: Secondary | ICD-10-CM | POA: Insufficient documentation

## 2020-09-19 DIAGNOSIS — I482 Chronic atrial fibrillation, unspecified: Secondary | ICD-10-CM | POA: Diagnosis present

## 2020-09-19 DIAGNOSIS — R42 Dizziness and giddiness: Secondary | ICD-10-CM | POA: Diagnosis not present

## 2020-09-19 DIAGNOSIS — F419 Anxiety disorder, unspecified: Secondary | ICD-10-CM | POA: Diagnosis present

## 2020-09-19 DIAGNOSIS — R531 Weakness: Secondary | ICD-10-CM | POA: Diagnosis not present

## 2020-09-19 DIAGNOSIS — Z87891 Personal history of nicotine dependence: Secondary | ICD-10-CM | POA: Diagnosis not present

## 2020-09-19 DIAGNOSIS — I6602 Occlusion and stenosis of left middle cerebral artery: Secondary | ICD-10-CM | POA: Diagnosis not present

## 2020-09-19 DIAGNOSIS — I361 Nonrheumatic tricuspid (valve) insufficiency: Secondary | ICD-10-CM

## 2020-09-19 DIAGNOSIS — Q283 Other malformations of cerebral vessels: Secondary | ICD-10-CM | POA: Diagnosis not present

## 2020-09-19 DIAGNOSIS — G459 Transient cerebral ischemic attack, unspecified: Secondary | ICD-10-CM

## 2020-09-19 DIAGNOSIS — I6523 Occlusion and stenosis of bilateral carotid arteries: Secondary | ICD-10-CM | POA: Diagnosis not present

## 2020-09-19 DIAGNOSIS — F3342 Major depressive disorder, recurrent, in full remission: Secondary | ICD-10-CM | POA: Diagnosis not present

## 2020-09-19 LAB — DIFFERENTIAL
Abs Immature Granulocytes: 0.05 10*3/uL (ref 0.00–0.07)
Basophils Absolute: 0.1 10*3/uL (ref 0.0–0.1)
Basophils Relative: 1 %
Eosinophils Absolute: 0.3 10*3/uL (ref 0.0–0.5)
Eosinophils Relative: 4 %
Immature Granulocytes: 1 %
Lymphocytes Relative: 18 %
Lymphs Abs: 1.3 10*3/uL (ref 0.7–4.0)
Monocytes Absolute: 1.2 10*3/uL — ABNORMAL HIGH (ref 0.1–1.0)
Monocytes Relative: 16 %
Neutro Abs: 4.7 10*3/uL (ref 1.7–7.7)
Neutrophils Relative %: 60 %

## 2020-09-19 LAB — PROTIME-INR
INR: 1.1 (ref 0.8–1.2)
Prothrombin Time: 13.9 seconds (ref 11.4–15.2)

## 2020-09-19 LAB — CBC
HCT: 42.9 % (ref 36.0–46.0)
Hemoglobin: 14.2 g/dL (ref 12.0–15.0)
MCH: 30.2 pg (ref 26.0–34.0)
MCHC: 33.1 g/dL (ref 30.0–36.0)
MCV: 91.3 fL (ref 80.0–100.0)
Platelets: 196 10*3/uL (ref 150–400)
RBC: 4.7 MIL/uL (ref 3.87–5.11)
RDW: 13.2 % (ref 11.5–15.5)
WBC: 7.7 10*3/uL (ref 4.0–10.5)
nRBC: 0 % (ref 0.0–0.2)

## 2020-09-19 LAB — COMPREHENSIVE METABOLIC PANEL
ALT: 25 U/L (ref 0–44)
AST: 27 U/L (ref 15–41)
Albumin: 4.1 g/dL (ref 3.5–5.0)
Alkaline Phosphatase: 51 U/L (ref 38–126)
Anion gap: 12 (ref 5–15)
BUN: 8 mg/dL (ref 8–23)
CO2: 27 mmol/L (ref 22–32)
Calcium: 9.4 mg/dL (ref 8.9–10.3)
Chloride: 100 mmol/L (ref 98–111)
Creatinine, Ser: 0.97 mg/dL (ref 0.44–1.00)
GFR, Estimated: >60 mL/min (ref >60)
Glucose, Bld: 116 mg/dL — ABNORMAL HIGH (ref 70–99)
Potassium: 4.4 mmol/L (ref 3.5–5.1)
Sodium: 139 mmol/L (ref 135–145)
Total Bilirubin: 1 mg/dL (ref 0.3–1.2)
Total Protein: 7.1 g/dL (ref 6.5–8.1)

## 2020-09-19 LAB — RESP PANEL BY RT-PCR (FLU A&B, COVID) ARPGX2
Influenza A by PCR: NEGATIVE
Influenza B by PCR: NEGATIVE
SARS Coronavirus 2 by RT PCR: NEGATIVE

## 2020-09-19 LAB — APTT: aPTT: 32 seconds (ref 24–36)

## 2020-09-19 MED ORDER — ACETAMINOPHEN 650 MG RE SUPP: 650.0000 mg | RECTAL | Status: DC | PRN

## 2020-09-19 MED ORDER — ACETAMINOPHEN 160 MG/5ML PO SOLN
650.0000 mg | ORAL | Status: DC | PRN
  Filled 2020-09-19: qty 20.3

## 2020-09-19 MED ORDER — SERTRALINE HCL 50 MG PO TABS
50.0000 mg | ORAL_TABLET | Freq: Every day | ORAL | Status: DC
  Administered 2020-09-20: 50 mg via ORAL
  Filled 2020-09-19: qty 1

## 2020-09-19 MED ORDER — ATORVASTATIN CALCIUM 20 MG PO TABS
80.0000 mg | ORAL_TABLET | Freq: Every day | ORAL | Status: DC
  Administered 2020-09-19 – 2020-09-20 (×2): 80 mg via ORAL
  Filled 2020-09-19 (×2): qty 4

## 2020-09-19 MED ORDER — OLOPATADINE HCL 0.1 % OP SOLN
1.0000 [drp] | Freq: Two times a day (BID) | OPHTHALMIC | Status: DC
  Administered 2020-09-19 – 2020-09-20 (×2): 1 [drp] via OPHTHALMIC
  Filled 2020-09-19: qty 5

## 2020-09-19 MED ORDER — FLUTICASONE PROPIONATE 50 MCG/ACT NA SUSP
2.0000 | Freq: Every day | NASAL | Status: DC
  Administered 2020-09-20: 10:00:00 2 via NASAL
  Filled 2020-09-19: qty 16

## 2020-09-19 MED ORDER — ASPIRIN 325 MG PO TABS
325.0000 mg | ORAL_TABLET | Freq: Every day | ORAL | Status: DC
  Filled 2020-09-19: qty 1

## 2020-09-19 MED ORDER — APIXABAN 5 MG PO TABS
5.0000 mg | ORAL_TABLET | Freq: Two times a day (BID) | ORAL | Status: DC
  Administered 2020-09-19 – 2020-09-20 (×2): 5 mg via ORAL
  Filled 2020-09-19 (×2): qty 1

## 2020-09-19 MED ORDER — LORATADINE 10 MG PO TABS
10.0000 mg | ORAL_TABLET | Freq: Every day | ORAL | Status: DC
  Administered 2020-09-20: 10 mg via ORAL
  Filled 2020-09-19: qty 1

## 2020-09-19 MED ORDER — MONTELUKAST SODIUM 10 MG PO TABS
10.0000 mg | ORAL_TABLET | Freq: Every day | ORAL | Status: DC
  Administered 2020-09-19: 10 mg via ORAL
  Filled 2020-09-19: qty 1

## 2020-09-19 MED ORDER — ASPIRIN 300 MG RE SUPP: 300.0000 mg | Freq: Every day | RECTAL | Status: DC

## 2020-09-19 MED ORDER — SODIUM CHLORIDE 0.9% FLUSH
3.0000 mL | Freq: Once | INTRAVENOUS | Status: DC
Start: 2020-09-19 — End: 2020-09-20

## 2020-09-19 MED ORDER — STROKE: EARLY STAGES OF RECOVERY BOOK: Freq: Once | Status: AC

## 2020-09-19 MED ORDER — EPINEPHRINE 0.3 MG/0.3ML IJ SOAJ: 0.3000 mg | INTRAMUSCULAR | Status: DC

## 2020-09-19 MED ORDER — ACETAMINOPHEN 325 MG PO TABS: 650.0000 mg | ORAL_TABLET | ORAL | Status: DC | PRN

## 2020-09-19 MED ORDER — B COMPLEX-C PO TABS
1.0000 | ORAL_TABLET | Freq: Every day | ORAL | Status: DC
  Administered 2020-09-20: 10:00:00 1 via ORAL
  Filled 2020-09-19: qty 1

## 2020-09-19 MED ORDER — INFLUENZA VAC A&B SA ADJ QUAD 0.5 ML IM PRSY
0.5000 mL | PREFILLED_SYRINGE | INTRAMUSCULAR | Status: AC
  Administered 2020-09-20: 15:00:00 0.5 mL via INTRAMUSCULAR
  Filled 2020-09-19: qty 0.5

## 2020-09-19 MED ORDER — ADULT MULTIVITAMIN W/MINERALS CH
1.0000 | ORAL_TABLET | Freq: Every day | ORAL | Status: DC
  Administered 2020-09-20: 10:00:00 1 via ORAL
  Filled 2020-09-19: qty 1

## 2020-09-19 NOTE — Progress Notes (Signed)
PT Cancellation Note  Patient Details Name: Crystal Haas MRN: [id number] DOB: Dec 08, 1945   Cancelled Treatment:    Reason Eval/Treat Not Completed: Patient at procedure or test/unavailable Chart reviewed, attempt to see pt at 1600 and she was at [place], returned at 1630 and she was still out of room.  Will try back tomorrow as appropriate.    Malachi Pro, DPT 09/19/2020, 4:32 PM

## 2020-09-19 NOTE — ED Notes (Signed)
Patient transported to MRI 

## 2020-09-19 NOTE — ED Provider Notes (Signed)
[facility] Emergency Department Provider Note  ____________________________________________   First MD Initiated Contact with Patient 09/19/20 1134     (approximate)  I have reviewed the triage vital signs and the nursing notes.   HISTORY  Chief Complaint Numbness   HPI Crystal Haas is a 74 y.o. female with a past medical history of A. fib anticoagulated on Eliquis, arthritis, anxiety, anemia, OSA on CPAP at night, GERD, depression, and obesity who presents for assessment of episode of weakness in her right arm and right leg that she states she noticed last night.  She states she went to bed feeling normal woke up around 2 and was able to move her right arm and right leg for approximately 20 minutes.  She states this resolved without doing anything else.  She states that while she was unable to move she urinated in her bed because she cannot go to the bathroom.  No prior similar episodes.  No clear precipitating events.  Patient states that she currently feels back to baseline although earlier she thought her speech was a little off.  She denies any fevers, chills, cough, nausea, vomiting, diarrhea, dysuria, Donnell pain, back pain or urinary symptoms.  No recent falls or traumatic injuries.  She denies any vision changes vertigo or headache.         Past Medical History:  Diagnosis Date  . Anemia    distant past  . Anxiety   . Arthritis    "everywhere" - big toes worst  . Chronic atrial fibrillation (HCC)    a. on eliquis; b. CHADS2VASc at least 2 (age x 1, female)  . Depression   . GERD (gastroesophageal reflux disease)    RARE  . Heart murmur    mild - followed by PCP  . Knee pain   . Motion sickness    back seat of car  . OSA on CPAP    CPAP-4 PSI  . 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 Seasonal allergies    takes allergy weekly  . Status post bilateral breast reduction 09/06/2016   Overview:  08/29/16    Patient Active Problem List    Diagnosis Date Noted  . Acute right ankle pain 07/27/2019  . Prediabetes 07/26/2019  . Rib pain on right side 11/03/2018  . Headache 11/03/2018  . Diverticulosis of large intestine without diverticulitis   . Lower GI bleed 10/23/2018  . Hematochezia   . Night sweats 04/20/2018  . Hand tingling 10/11/2017  . Morbid obesity (HCC) 10/11/2017  . Degenerative arthritis of right knee 07/17/2017  . Allergic rhinitis 09/02/2016  . Skin lesion 09/02/2016  . Change of skin color 08/06/2016  . Injury of right rotator cuff 02/15/2016  . Hyperkalemia 09/12/2015  . Chronic atrial fibrillation (HCC) 09/11/2015  . Sleep apnea 08/30/2015  . Depression 06/21/2015  . Chronic insomnia 05/30/2015  . Anxiety 05/27/2014  . Hypertension 05/27/2014  . Cataracts, bilateral 05/25/2014  . 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  . 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 Macular hole of left eye 05/24/2014  . 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 BREAST REDUCTION SURGERY Bilateral 08/29/2016   Procedure: BILATERAL MAMMARY REDUCTION  (BREAST)WITH LIPOSUCTION;  Surgeon: Peggye Form, DO;  Location: [facility];  Service: Plastics;  Laterality: Bilateral;  . CATARACT EXTRACTION W/PHACO Left 05/03/2015   Procedure: CATARACT EXTRACTION PHACO AND INTRAOCULAR  LENS PLACEMENT (IOC);  Surgeon: Lockie Mola, MD;  Location: [facility];  Service: Ophthalmology;  Laterality: Left;  CPAP  . CATARACT EXTRACTION W/PHACO Right 10/09/2016   Procedure: CATARACT EXTRACTION PHACO AND INTRAOCULAR LENS PLACEMENT (IOC);  Surgeon: Lockie Mola, MD;  Location: [facility];  Service: Ophthalmology;  Laterality: Right;  sleep apnea  . CHONDROPLASTY Right 04/29/2016   Procedure: CHONDROPLASTY;  Surgeon: Donato Heinz, MD;  Location: [facility];  Service: Orthopedics;  Laterality: Right;  .  COLONOSCOPY WITH PROPOFOL N/A 10/24/2018   Procedure: COLONOSCOPY WITH PROPOFOL;  Surgeon: Pasty Spillers, MD;  Location: [facility];  Service: Endoscopy;  Laterality: N/A;  . EYE SURGERY    . KNEE ARTHROPLASTY Right 03/25/2018   Procedure: COMPUTER ASSISTED TOTAL KNEE ARTHROPLASTY;  Surgeon: Donato Heinz, MD;  Location: [facility];  Service: Orthopedics;  Laterality: Right;  . KNEE ARTHROSCOPY WITH LATERAL MENISECTOMY  04/29/2016   Procedure: KNEE ARTHROSCOPY WITH LATERAL MENISECTOMY;  Surgeon: Donato Heinz, MD;  Location: [facility];  Service: Orthopedics;;  . KNEE ARTHROSCOPY WITH MEDIAL MENISECTOMY  04/29/2016   Procedure: KNEE ARTHROSCOPY WITH MEDIAL MENISECTOMY;  Surgeon: Donato Heinz, MD;  Location: [facility];  Service: Orthopedics;;  . REDUCTION MAMMAPLASTY Bilateral 09/2016  . RETINAL DETACHMENT SURGERY Left May 03, 2015   Dr. Inez Pilgrim, [facility]  . TUBAL LIGATION      Prior to Admission medications   Medication Sig Start Date End Date Taking? Authorizing Provider  apixaban (ELIQUIS) 5 MG TABS tablet Take 1 tablet (5 mg total) by mouth 2 (two) times daily. 08/30/20   Alver Sorrow, NP  atorvastatin (LIPITOR) 20 MG tablet TAKE 1 TABLET BY MOUTH ONCE DAILY 08/30/20   Alver Sorrow, NP  B Complex-C (B-COMPLEX WITH VITAMIN C) tablet Take 1 tablet by mouth daily.    [provider]  cetirizine (ZYRTEC) 10 MG tablet Take 1 tablet (10 mg total) by mouth daily. 01/22/19   Glori Luis, MD  EPIPEN 2-PAK 0.3 MG/0.3ML SOAJ injection Inject 0.3 mLs (0.3 mg total) into the muscle as directed. AS NEEDED FOR ANAPHYLAXIS 01/22/19   Glori Luis, MD  fluticasone [facility]) 50 MCG/ACT nasal spray Place 2 sprays into both nostrils daily. 01/22/19   Glori Luis, MD  GLUCOSAMINE-CHONDROITIN DS PO Take 3,000 mg by mouth daily.    [provider]  losartan (COZAAR) 25 MG tablet Take 1 tablet (25 mg total) by mouth daily. 08/30/20   Alver Sorrow, NP    montelukast (SINGULAIR) 10 MG tablet Take 10 mg by mouth at bedtime.  08/20/19   [provider]  Multiple Vitamin (MULTIVITAMIN WITH MINERALS) TABS tablet Take 1 tablet by mouth daily. One-A-Day Active 65+    [provider]  NON FORMULARY 10 each by Other route daily. GIN SOAKED RAISINS FOR PAIN RELIEF    [provider]  Olopatadine HCl (PATADAY) 0.2 % SOLN Place 1 drop into both eyes daily.    [provider]  Papaya CHEW Chew 3-4 each by mouth as needed.    [provider]  sertraline (ZOLOFT) 50 MG tablet Take 1 tablet (50 mg total) by mouth daily. 03/14/20   Glori Luis, MD    Allergies Ivp dye [iodinated diagnostic agents], Iodine, and Tape  Family History  Problem Relation Age of Onset  . Alcoholism Other   . Heart disease Other   . Breast cancer Neg Hx     Social History Social History  Tobacco Use  . Smoking status: Former Smoker    Packs/day: 0.25    Years: 2.00    Pack years: 0.50    Types: Cigarettes    Quit date: 10/21/1969    Years since quitting: 50.9  . Smokeless tobacco: Never Used  Vaping Use  . Vaping Use: Never used  Substance Use Topics  . Alcohol use: Not Currently    Comment: occasional glass of wine  . Drug use: No    Review of Systems  Review of Systems  Constitutional: Positive for malaise/fatigue. Negative for chills and fever.  HENT: Negative for sore throat.   Eyes: Negative for pain.  Respiratory: Negative for cough and stridor.   Cardiovascular: Negative for chest pain.  Gastrointestinal: Negative for vomiting.  Genitourinary: Negative for dysuria.  Musculoskeletal: Negative for myalgias.  Skin: Negative for rash.  Neurological: Positive for speech change, focal weakness and weakness. Negative for seizures, loss of consciousness and headaches.  Psychiatric/Behavioral: Negative for suicidal ideas.  All other systems reviewed and are negative.      ____________________________________________   PHYSICAL EXAM:  VITAL SIGNS: ED Triage Vitals [09/19/20 1025]  Enc Vitals Group     BP (!) 167/93     Pulse Rate 83     Resp 18     Temp 97.7 F (36.5 C)     Temp src      SpO2 96 %     Weight 232 lb (105.2 kg)     Height 5\' 5"  (1.651 m)     Head Circumference      Peak Flow      Pain Score 0     Pain Loc      Pain Edu?      Excl. in GC?    Vitals:   09/19/20 1025 09/19/20 1155  BP: (!) 167/93 (!) 160/71  Pulse: 83 84  Resp: 18 16  Temp: 97.7 F (36.5 C)   SpO2: 96% 98%   Physical Exam Vitals and nursing note reviewed.  Constitutional:      General: She is not in acute distress.    Appearance: She is well-developed. She is obese.  HENT:     Head: Normocephalic and atraumatic.     Right Ear: External ear normal.     Left Ear: External ear normal.     Nose: Nose normal.     Mouth/Throat:     Mouth: Mucous membranes are moist.  Eyes:     Conjunctiva/sclera: Conjunctivae normal.  Cardiovascular:     Rate and Rhythm: Normal rate and regular rhythm.     Heart sounds: No murmur heard.   Pulmonary:     Effort: Pulmonary effort is normal. No respiratory distress.     Breath sounds: Normal breath sounds.  Abdominal:     Palpations: Abdomen is soft.     Tenderness: There is no abdominal tenderness.  Musculoskeletal:     Cervical back: Neck supple.  Skin:    General: Skin is warm and dry.     Capillary Refill: Capillary refill takes less than 2 seconds.  Neurological:     Mental Status: She is alert and oriented to person, place, and time.  Psychiatric:        Mood and Affect: Mood normal.     Cranial nerves II through XII grossly intact.  No pronator drift.  No finger dysmetria.  Symmetric 5/5 strength of all extremities.  Sensation intact to light touch in all extremities.  Unremarkable unassisted  gait.  ____________________________________________   LABS (all labs ordered are listed, but only abnormal  results are displayed)  Labs Reviewed  DIFFERENTIAL - Abnormal; Notable for the following components:      Result Value   Monocytes Absolute 1.2 (*)    All other components within normal limits  COMPREHENSIVE METABOLIC PANEL - Abnormal; Notable for the following components:   Glucose, Bld 116 (*)    All other components within normal limits  RESP PANEL BY RT-PCR (FLU A&B, COVID) ARPGX2  PROTIME-INR  APTT  CBC  I-STAT CREATININE, ED  CBG MONITORING, ED   ____________________________________________  EKG  A. fib with a ventricular of 71, normal axis, unremarkable intervals, no clear evidence of acute ischemia. ____________________________________________  RADIOLOGY  ED MD interpretation: No evidence of acute intracranial hemorrhage or other acute intracranial process  Official radiology report(s): CT HEAD WO CONTRAST  Result Date: 09/19/2020 CLINICAL DATA:  Dizziness EXAM: CT HEAD WITHOUT CONTRAST TECHNIQUE: Contiguous axial images were obtained from the base of the skull through the vertex without intravenous contrast. COMPARISON:  None. FINDINGS: Brain: There is no acute intracranial hemorrhage, mass effect, or edema. Gray-white differentiation is preserved. There is no extra-axial fluid collection. Ventricles and sulci are within normal limits in size and configuration. Vascular: No hyperdense vessel or unexpected calcification. Skull: Calvarium is unremarkable. Sinuses/Orbits: No acute finding. Other: None. IMPRESSION: No acute intracranial abnormality Electronically Signed   By: Guadlupe Spanish M.D.   On: 09/19/2020 11:22    ____________________________________________   PROCEDURES  Procedure(s) performed (including Critical Care):  .1-3 Lead EKG Interpretation Performed by: Gilles Chiquito, MD Authorized by: Gilles Chiquito, MD     Interpretation: normal     ECG rate assessment: normal     Rhythm: atrial fibrillation     Ectopy: none     Conduction: normal        ____________________________________________   INITIAL IMPRESSION / ASSESSMENT AND PLAN / ED COURSE        Patient presents with left history exam for episode of right hemibody weakness numbness and some speech changes that all resolved after 20 or 30 minutes.  Patient is afebrile hemodynamically stable on arrival.  On exam she has no appreciable neurological deficits.  Am concerned about possible TIA.  There is no historical or exam findings to suggest acute traumatic injury or acute infectious process.  Patient has no significant ocular metabolic derangements on her labs including CMP which shows no significant ocular metabolic derangements..  CBC unremarkable.  Low suspicion for toxic ingestion.  No evidence of mass-effect or hemorrhage or other acute intracranial process on CT head.  I will plan to admit to medicine service for TIA work-up.    ____________________________________________   FINAL CLINICAL IMPRESSION(S) / ED DIAGNOSES  Final diagnoses:  Weakness    Medications  sodium chloride flush (NS) 0.9 % injection 3 mL (3 mLs Intravenous Not Given 09/19/20 1141)     ED Discharge Orders    None       Note:  This document was prepared using Dragon voice recognition software and may include unintentional dictation errors.   Gilles Chiquito, MD 09/19/20 [phone]

## 2020-09-19 NOTE — Telephone Encounter (Signed)
Spoke with pt regarding her McChart message of "Last night for a brief time I was paralyzed on the right side, so much so I WET THE BED!!!   what do I do now?????". Pt stated around 02:30am she woke up from her sleep to use the bathroom but could not get out of bed due to not being able to move her right arm, right hand, or her right leg, therefore she wet the bed because she could not move. Pt reports flaccid of her right upper and lower extremities lasted for about . This RN noted pt with stuttering of words while on the phone, advised pt she may need to be seen in the ED for further evaluation of possible stroke like symptoms. Pt has a friend that should arrive in less then a hour that was taking her to her schedule allergen appointment, but now will take pt to [facility] ED to be seen for her symptoms. Advised pt to call 911 if new of worsening symptoms before her friend arrives. Pt verbalized understanding.

## 2020-09-19 NOTE — ED Triage Notes (Addendum)
Pt comes via POV from home with c/o right arm and leg paralyzed. Pt states this happened around 0200 this morning. Pt also had episode of incontinence due to not being able to move.  Pt states earlier she did have numbness, tingling and slurred speech. Pt states she thought she was dreaming until she woke up and realized she couldn't move her arm and leg.  Pt denies any current numbness, blurred vision or headache. Pt states generalized weakness.   Pt states she takes eliquis

## 2020-09-19 NOTE — Progress Notes (Signed)
SLP Cancellation Note  Patient Details Name: Crystal Haas MRN: [id number] DOB: 10/08/1946   Cancelled treatment:       Reason Eval/Treat Not Completed:  (pt being transferred to floor). Pt unavailable currentlym but ST services will f/u in AM. Noted per chart/MD notes that pt's speech issues had cleared. Also noted pt had passed the [place] swallow screen so alerted NSG for diet order. Recommend general aspiration precautions w/ all oral intake.     Jerilynn Som, MS, CCC-SLP Speech Language Pathologist Rehab Services [phone] [facility] 09/19/2020, 2:56 PM

## 2020-09-19 NOTE — ED Notes (Signed)
Admitting Provider at bedside. 

## 2020-09-19 NOTE — H&P (Addendum)
History and Physical    Crystal Haas OEV:[id number] DOB: 1946-06-12 DOA: 09/19/2020  PCP: Glori Luis, MD   Patient coming from: Home  I have personally briefly reviewed patient's old medical records in [facility]  Chief Complaint: Right-sided numbness  HPI: Crystal Haas is a 74 y.o. female with medical history significant for morbid obesity (BMI 38), history of atrial fibrillation on anticoagulation with Eliquis, history of anxiety, history of depression, history of obstructive sleep apnea on CPAP and GERD who presents to the emergency room via private vehicle for evaluation of numbness and weakness involving her right arm and leg that she noticed around 2:30 AM on the morning of her admission.  Patient states that she woke up from sleep and noticed that she was unable to move her right arm or leg and could not get up to go to the bathroom and so she urinated in her bed.  Her symptoms lasted about 20 minutes and resolved. She called her cardiologist office and one on the phone did notice that her speech was slurred and advised her to come straight to the emergency room for evaluation. She denies having any blurry vision, no trouble swallowing, no lower extremity weakness, no headache, no dizziness, no lightheadedness, no fever, no chills, no cough, no nausea, no vomiting, no diarrhea or any urinary symptoms. Labs show sodium 139, potassium 4.4, chloride 100, bicarb 27, glucose 116, BUN 8, creatinine 0.97, calcium 9.4, alkaline phosphatase 51, albumin 4.1, AST 27, ALT 25, total protein 7.1, white count 7.7, hemoglobin 14.2, hematocrit 42.9, MCV 91.3, RDW 13.2, platelet count 196, PT 13.9, INR 1.1, Respiratory viral panel is negative CT scan of the head without contrast shows no acute abnormality MRI of the brain shows small infarcts in the left insula and overlying frontal operculum. Possible punctate additional infarct in the left corona radiata. Mild associated edema without mass  effect. Focus of susceptibility artifact in the adjacent left sylvian fissure. While nonspecific, this could conceivably represent focal clot in a left M2/M3 MCA branch. No evidence of acute hemorrhage in this region on same day CT. Consider correlation with CTA. Twelve-lead EKG reviewed by me shows atrial fibrillation  ED Course: Patient is a 74 year old Caucasian female with a history of atrial fibrillation on chronic anticoagulation therapy who presents to the ER for evaluation of right-sided weakness and numbness which has resolved as well as slurred speech.  Initial CT scan of the head without contrast was negative for any intracranial hemorrhage but MRI of the brain without contrast shows small infarcts in the left insula and overlying frontal operculum.  She will be admitted to the hospital for further evaluation.  Review of Systems: As per HPI otherwise 10 point review of systems negative.    Past Medical History:  Diagnosis Date  . Anemia    distant past  . Anxiety   . Arthritis    "everywhere" - big toes worst  . Chronic atrial fibrillation (HCC)    a. on eliquis; b. CHADS2VASc at least 2 (age x 1, female)  . Depression   . GERD (gastroesophageal reflux disease)    RARE  . Heart murmur    mild - followed by PCP  . Knee pain   . Motion sickness    back seat of car  . OSA on CPAP    CPAP-4 PSI  . 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 Seasonal allergies    takes allergy weekly  . Status post bilateral breast reduction 09/06/2016  Overview:  08/29/16    Past Surgical History:  Procedure Laterality Date  . BREAST REDUCTION SURGERY Bilateral 08/29/2016   Procedure: BILATERAL MAMMARY REDUCTION  (BREAST)WITH LIPOSUCTION;  Surgeon: Peggye Form, DO;  Location: [facility];  Service: Plastics;  Laterality: Bilateral;  . CATARACT EXTRACTION W/PHACO Left 05/03/2015   Procedure: CATARACT EXTRACTION PHACO AND INTRAOCULAR LENS PLACEMENT (IOC);  Surgeon:  Lockie Mola, MD;  Location: [facility];  Service: Ophthalmology;  Laterality: Left;  CPAP  . CATARACT EXTRACTION W/PHACO Right 10/09/2016   Procedure: CATARACT EXTRACTION PHACO AND INTRAOCULAR LENS PLACEMENT (IOC);  Surgeon: Lockie Mola, MD;  Location: [facility];  Service: Ophthalmology;  Laterality: Right;  sleep apnea  . CHONDROPLASTY Right 04/29/2016   Procedure: CHONDROPLASTY;  Surgeon: Donato Heinz, MD;  Location: [facility];  Service: Orthopedics;  Laterality: Right;  . COLONOSCOPY WITH PROPOFOL N/A 10/24/2018   Procedure: COLONOSCOPY WITH PROPOFOL;  Surgeon: Pasty Spillers, MD;  Location: [facility];  Service: Endoscopy;  Laterality: N/A;  . EYE SURGERY    . KNEE ARTHROPLASTY Right 03/25/2018   Procedure: COMPUTER ASSISTED TOTAL KNEE ARTHROPLASTY;  Surgeon: Donato Heinz, MD;  Location: [facility];  Service: Orthopedics;  Laterality: Right;  . KNEE ARTHROSCOPY WITH LATERAL MENISECTOMY  04/29/2016   Procedure: KNEE ARTHROSCOPY WITH LATERAL MENISECTOMY;  Surgeon: Donato Heinz, MD;  Location: [facility];  Service: Orthopedics;;  . KNEE ARTHROSCOPY WITH MEDIAL MENISECTOMY  04/29/2016   Procedure: KNEE ARTHROSCOPY WITH MEDIAL MENISECTOMY;  Surgeon: Donato Heinz, MD;  Location: [facility];  Service: Orthopedics;;  . REDUCTION MAMMAPLASTY Bilateral 09/2016  . RETINAL DETACHMENT SURGERY Left May 03, 2015   Dr. Inez Pilgrim, [facility]  . TUBAL LIGATION       reports that she quit smoking about 50 years ago. Her smoking use included cigarettes. She has a 0.50 pack-year smoking history. She has never used smokeless tobacco. She reports previous alcohol use. She reports that she does not use drugs.  Allergies  Allergen Reactions  . Ivp Dye [Iodinated Diagnostic Agents] Shortness Of Breath    Also swelling.  Topical betadine is OK.  . Iodine Swelling    IV   . Tape Other (See Comments)    Most tapes case raw skin.  Paper tape is OK.    Family History   Problem Relation Age of Onset  . Alcoholism Other   . Heart disease Other   . Breast cancer Neg Hx      Prior to Admission medications   Medication Sig Start Date End Date Taking? Authorizing Provider  apixaban (ELIQUIS) 5 MG TABS tablet Take 1 tablet (5 mg total) by mouth 2 (two) times daily. 08/30/20  Yes Alver Sorrow, NP  atorvastatin (LIPITOR) 20 MG tablet TAKE 1 TABLET BY MOUTH ONCE DAILY Patient taking differently: Take 20 mg by mouth at bedtime. TAKE 1 TABLET BY MOUTH ONCE DAILY 08/30/20  Yes Alver Sorrow, NP  azelastine (OPTIVAR) 0.05 % ophthalmic solution Place 1 drop into both eyes 2 (two) times daily.   Yes [provider]  B Complex-C (B-COMPLEX WITH VITAMIN C) tablet Take 1 tablet by mouth daily.   Yes [provider]  Black Cohosh 40 MG CAPS Take 40 mg by mouth daily.   Yes [provider]  cetirizine (ZYRTEC) 10 MG tablet Take 1 tablet (10 mg total) by mouth daily. 01/22/19  Yes Glori Luis, MD  EPIPEN 2-PAK 0.3 MG/0.3ML SOAJ injection Inject 0.3 mLs (0.3  mg total) into the muscle as directed. AS NEEDED FOR ANAPHYLAXIS 01/22/19  Yes Glori LuisSonnenberg, Eric G, MD  fluticasone [facility](FLONASE) 50 MCG/ACT nasal spray Place 2 sprays into both nostrils daily. 01/22/19  Yes Glori LuisSonnenberg, Eric G, MD  GLUCOSAMINE-CHONDROITIN DS PO Take 3,000 mg by mouth daily.   Yes [provider]  losartan (COZAAR) 25 MG tablet Take 1 tablet (25 mg total) by mouth daily. 08/30/20  Yes Alver SorrowWalker, Caitlin S, NP  Magnesium 250 MG TABS Take 250 mg by mouth daily.   Yes [provider]  montelukast (SINGULAIR) 10 MG tablet Take 10 mg by mouth at bedtime.  08/20/19  Yes [provider]  Multiple Vitamin (MULTIVITAMIN WITH MINERALS) TABS tablet Take 1 tablet by mouth daily. One-A-Day Active 65+   Yes [provider]  sertraline (ZOLOFT) 50 MG tablet Take 1 tablet (50 mg total) by mouth daily. 03/14/20  Yes Glori LuisSonnenberg, Eric G, MD    Physical  Exam: Vitals:   09/19/20 1025 09/19/20 1155 09/19/20 1350 09/19/20 1441  BP: (!) 167/93 (!) 160/71 (!) 168/88 (!) 159/75  Pulse: 83 84 87 67  Resp: 18 16 16 18   Temp: 97.7 F (36.5 C)   97.9 F (36.6 C)  TempSrc:    Oral  SpO2: 96% 98% 99% 99%  Weight: 105.2 kg     Height: 5\' 5"  (1.651 m)        Vitals:   09/19/20 1025 09/19/20 1155 09/19/20 1350 09/19/20 1441  BP: (!) 167/93 (!) 160/71 (!) 168/88 (!) 159/75  Pulse: 83 84 87 67  Resp: 18 16 16 18   Temp: 97.7 F (36.5 C)   97.9 F (36.6 C)  TempSrc:    Oral  SpO2: 96% 98% 99% 99%  Weight: 105.2 kg     Height: 5\' 5"  (1.651 m)       Constitutional: NAD, alert and oriented x 3.  Morbidly obese.  Appears comfortable and in no distress Eyes: PERRL, lids and conjunctivae normal ENMT: Mucous membranes are moist.  Neck: normal, supple, no masses, no thyromegaly Respiratory: clear to auscultation bilaterally, no wheezing, no crackles. Normal respiratory effort. No accessory muscle use.  Cardiovascular: Irregularly irregular, no murmurs / rubs / gallops. No extremity edema. 2+ pedal pulses. No carotid bruits.  Abdomen: no tenderness, no masses palpated. No hepatosplenomegaly. Bowel sounds positive.  Musculoskeletal: no clubbing / cyanosis. No joint deformity upper and lower extremities.  Skin: no rashes, lesions, ulcers.  Neurologic: No gross focal neurologic deficit.  Able to move all extremities Psychiatric: Normal mood and affect.   Labs on Admission: I have personally reviewed following labs and imaging studies  CBC: Recent Labs  Lab 09/19/20 1030  WBC 7.7  NEUTROABS 4.7  HGB 14.2  HCT 42.9  MCV 91.3  PLT 196   Basic Metabolic Panel: Recent Labs  Lab 09/19/20 1030  NA 139  K 4.4  CL 100  CO2 27  GLUCOSE 116*  BUN 8  CREATININE 0.97  CALCIUM 9.4   GFR: Estimated Creatinine Clearance: 62.2 mL/min (by C-G formula based on SCr of 0.97 mg/dL). Liver Function Tests: Recent Labs  Lab 09/19/20 1030  AST 27   ALT 25  ALKPHOS 51  BILITOT 1.0  PROT 7.1  ALBUMIN 4.1   No results for input(s): LIPASE, AMYLASE in the last 168 hours. No results for input(s): AMMONIA in the last 168 hours. Coagulation Profile: Recent Labs  Lab 09/19/20 1030  INR 1.1   Cardiac Enzymes: No results for input(s):  CKTOTAL, CKMB, CKMBINDEX, TROPONINI in the last 168 hours. BNP (last 3 results) No results for input(s): PROBNP in the last 8760 hours. HbA1C: No results for input(s): HGBA1C in the last 72 hours. CBG: No results for input(s): GLUCAP in the last 168 hours. Lipid Profile: No results for input(s): CHOL, HDL, LDLCALC, TRIG, CHOLHDL, LDLDIRECT in the last 72 hours. Thyroid Function Tests: No results for input(s): TSH, T4TOTAL, FREET4, T3FREE, THYROIDAB in the last 72 hours. Anemia Panel: No results for input(s): VITAMINB12, FOLATE, FERRITIN, TIBC, IRON, RETICCTPCT in the last 72 hours. Urine analysis:    Component Value Date/Time   COLORURINE YELLOW 07/27/2019 1621   APPEARANCEUR CLEAR 07/27/2019 1621   LABSPEC <=1.005 (A) 07/27/2019 1621   PHURINE 5.5 07/27/2019 1621   GLUCOSEU NEGATIVE 07/27/2019 1621   HGBUR NEGATIVE 07/27/2019 1621   BILIRUBINUR NEGATIVE 07/27/2019 1621   BILIRUBINUR neg 04/20/2018 1219   KETONESUR NEGATIVE 07/27/2019 1621   PROTEINUR NEGATIVE 10/22/2018 1510   UROBILINOGEN 0.2 07/27/2019 1621   NITRITE NEGATIVE 07/27/2019 1621   LEUKOCYTESUR NEGATIVE 07/27/2019 1621    Radiological Exams on Admission: CT HEAD WO CONTRAST  Result Date: 09/19/2020 CLINICAL DATA:  Dizziness EXAM: CT HEAD WITHOUT CONTRAST TECHNIQUE: Contiguous axial images were obtained from the base of the skull through the vertex without intravenous contrast. COMPARISON:  None. FINDINGS: Brain: There is no acute intracranial hemorrhage, mass effect, or edema. Gray-white differentiation is preserved. There is no extra-axial fluid collection. Ventricles and sulci are within normal limits in size and  configuration. Vascular: No hyperdense vessel or unexpected calcification. Skull: Calvarium is unremarkable. Sinuses/Orbits: No acute finding. Other: None. IMPRESSION: No acute intracranial abnormality Electronically Signed   By: Guadlupe Spanish M.D.   On: 09/19/2020 11:22   MR BRAIN WO CONTRAST  Result Date: 09/19/2020 CLINICAL DATA:  Neuro deficit, acute stroke suspected EXAM: MRI HEAD WITHOUT CONTRAST TECHNIQUE: Multiplanar, multiecho pulse sequences of the brain and surrounding structures were obtained without intravenous contrast. COMPARISON:  Same day CT head. FINDINGS: Brain: Small infarcts in the left insula and overlying frontal operculum. Possible punctate additional infarct in the left corona radiata. Mild edema without mass effect. Focus of susceptibility artifact in the adjacent left sylvian fissure. (Series 11, image 28). No correlate hemorrhage on same day CT. No evidence of acute hemorrhage. Dilated perivascular space in the inferior left basal ganglia. Additional scattered T2/FLAIR hyperintensities, likely related to chronic microvascular ischemic disease. No hydrocephalus. No midline shift. Basal cisterns are patent. Vascular: Major arterial flow voids are maintained at the skull base. Skull and upper cervical spine: Normal marrow signal. Sinuses/Orbits: Negative. Other: No mastoid effusions. IMPRESSION: 1. Small infarcts in the left insula and overlying frontal operculum. Possible punctate additional infarct in the left corona radiata. Mild associated edema without mass effect. 2. Focus of susceptibility artifact in the adjacent left sylvian fissure. While nonspecific, this could conceivably represent focal clot in a left M2/M3 MCA branch. No evidence of acute hemorrhage in this region on same day CT. Consider correlation with CTA. These results will be called to the ordering clinician or representative by the Radiologist Assistant, and communication documented in the PACS or [employer].  Electronically Signed   By: Feliberto Harts MD   On: 09/19/2020 14:03    EKG: Independently reviewed.  Atrial fibrillation  Assessment/Plan Principal Problem:   CVA (cerebral vascular accident) [facility]) Active Problems:   Depression   Chronic atrial fibrillation (HCC)   Anxiety   GERD (gastroesophageal reflux disease)   Obesity (BMI  30-39.9)       Acute CVA Patient presents to the emergency room for evaluation of transient right-sided numbness and weakness which has resolved associated with slurred speech concerning for TIA/acute CVA MRI of the brain confirms small infarcts Patient has a history of chronic A. fib and is on anticoagulation therapy and has been compliant with same Obtain 2D echocardiogram to assess for cardiac thrombus Continue Eliquis and start patient on high intensity statin We will allow for permissive hypertension We will consult neurology We will request PT/OT/ST consult    Obesity (BMI 38) Complicates overall prognosis and care    Chronic atrial fibrillation Rate controlled Continue Eliquis    Anxiety and depression Continue sertraline   DVT prophylaxis: Eliquis Code Status: Full code Family Communication: Greater than 50% of time was spent discussing plan of care with patient and her granddaughter at the bedside.  All questions and concerns have been addressed.  They verbalized understanding and agree with plan. Disposition Plan: Back to previous home environment Consults called: Neurology    Lucile Shutters MD [facility]     09/19/2020, 3:01 PM

## 2020-09-19 NOTE — Consult Note (Addendum)
Referring Physician: Dr. Joylene Igo    Chief Complaint: Transient right sided weakness.   HPI: Crystal Haas is an 74 y.o. female with a PMHx of chronic atrial fibrillation on Eliquis who presented to the hospital for evaluation of RUE and RLE weakness. Symptoms were first noticed at 0230 when she woke up from her sleep to use the bathroom but could not get out of bed due to not being able to move her right arm, right hand, or her right leg. She wet her bed due to not being able to move. She endorsed flaccid weakness of her RUE and RLE that lasted for about 20 minutes. She thought she was dreaming until she woke up and realized she couldn't move her right arm and leg. When she called [facility] to report this, the answering RN noted the patient to have stuttering of words while on the phone and advised the patient to be seen in the ED for further evaluation of possible stroke symptoms.   In the ED, she denied any numbness, blurred vision or headache, but endorsed generalized weakness.   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  Her PMHx includes arthritis, anxiety, anemia, OSA on CPAP at night, GERD, depression, and obesity.  At the time of Neurology consultation, she endorses having missed a dose of her Eliquis 2 evenings ago. The dose was held due to a laceration of her left palm that was bleeding profusely to the extent that she had to call EMS. She was told to hold the dose. It took about 4 hours total for the bleeding to stop, with the aid of dressing/compression by EMS.   MRI brain: 1. Small infarcts in the left insula and overlying frontal operculum. Possible punctate additional infarct in the left corona radiata. Mild associated edema without mass effect. 2. Focus of susceptibility artifact in the adjacent left sylvian fissure. While nonspecific, this could conceivably represent focal clot in a left M2/M3  MCA branch. No evidence of acute hemorrhage in this region on same day CT. Consider correlation with CTA.  LSN: Prior to 0230 tPA Given: No: Out of time window.   Past Medical History:  Diagnosis Date  . Anemia    distant past  . Anxiety   . Arthritis    "everywhere" - big toes worst  . Chronic atrial fibrillation (HCC)    a. on eliquis; b. CHADS2VASc at least 2 (age x 1, female)  . Depression   . GERD (gastroesophageal reflux disease)    RARE  . Heart murmur    mild - followed by PCP  . Knee pain   . Motion sickness    back seat of car  . OSA on CPAP    CPAP-4 PSI  . 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 Seasonal allergies    takes allergy weekly  . Status post bilateral breast reduction 09/06/2016   Overview:  08/29/16    Past Surgical History:  Procedure Laterality Date  . BREAST REDUCTION SURGERY Bilateral 08/29/2016   Procedure: BILATERAL MAMMARY REDUCTION  (BREAST)WITH LIPOSUCTION;  Surgeon: Peggye Form, DO;  Location: [facility];  Service: Plastics;  Laterality: Bilateral;  . CATARACT EXTRACTION W/PHACO Left 05/03/2015   Procedure: CATARACT EXTRACTION PHACO AND INTRAOCULAR LENS PLACEMENT (IOC);  Surgeon: Lockie Mola, MD;  [facility];  Service: Ophthalmology;  Laterality: Left;  CPAP  . CATARACT EXTRACTION W/PHACO Right 10/09/2016  Procedure: CATARACT EXTRACTION PHACO AND INTRAOCULAR LENS PLACEMENT (IOC);  Surgeon: Lockie Mola, MD;  Location: [facility];  Service: Ophthalmology;  Laterality: Right;  sleep apnea  . CHONDROPLASTY Right 04/29/2016   Procedure: CHONDROPLASTY;  Surgeon: Donato Heinz, MD;  Location: [facility];  Service: Orthopedics;  Laterality: Right;  . COLONOSCOPY WITH PROPOFOL N/A 10/24/2018   Procedure: COLONOSCOPY WITH PROPOFOL;  Surgeon: Pasty Spillers, MD;  Location: [facility];  Service: Endoscopy;  Laterality: N/A;  . EYE SURGERY    . KNEE ARTHROPLASTY Right 03/25/2018    Procedure: COMPUTER ASSISTED TOTAL KNEE ARTHROPLASTY;  Surgeon: Donato Heinz, MD;  Location: [facility];  Service: Orthopedics;  Laterality: Right;  . KNEE ARTHROSCOPY WITH LATERAL MENISECTOMY  04/29/2016   Procedure: KNEE ARTHROSCOPY WITH LATERAL MENISECTOMY;  Surgeon: Donato Heinz, MD;  Location: [facility];  Service: Orthopedics;;  . KNEE ARTHROSCOPY WITH MEDIAL MENISECTOMY  04/29/2016   Procedure: KNEE ARTHROSCOPY WITH MEDIAL MENISECTOMY;  Surgeon: Donato Heinz, MD;  Location: [facility];  Service: Orthopedics;;  . REDUCTION MAMMAPLASTY Bilateral 09/2016  . RETINAL DETACHMENT SURGERY Left May 03, 2015   Dr. Inez Pilgrim, [facility]  . TUBAL LIGATION      Family History  Problem Relation Age of Onset  . Alcoholism Other   . Heart disease Other   . Breast cancer Neg Hx    Social History:  reports that she quit smoking about 50 years ago. Her smoking use included cigarettes. She has a 0.50 pack-year smoking history. She has never used smokeless tobacco. She reports previous alcohol use. She reports that she does not use drugs.  Allergies:  Allergies  Allergen Reactions  . Ivp Dye [Iodinated Diagnostic Agents] Shortness Of Breath    Also swelling.  Topical betadine is OK.  . Iodine Swelling    IV   . Tape Other (See Comments)    Most tapes case raw skin.  Paper tape is OK.    Medications:  Prior to Admission:  Medications Prior to Admission  Medication Sig Dispense Refill Last Dose  . apixaban (ELIQUIS) 5 MG TABS tablet Take 1 tablet (5 mg total) by mouth 2 (two) times daily. 60 tablet 11 09/19/2020 at 0800  . atorvastatin (LIPITOR) 20 MG tablet TAKE 1 TABLET BY MOUTH ONCE DAILY (Patient taking differently: Take 20 mg by mouth at bedtime. TAKE 1 TABLET BY MOUTH ONCE DAILY) 90 tablet 3 09/18/2020 at Unknown time  . azelastine (OPTIVAR) 0.05 % ophthalmic solution Place 1 drop into both eyes 2 (two) times daily.   09/19/2020 at 0800  . B Complex-C (B-COMPLEX WITH VITAMIN C) tablet Take 1  tablet by mouth daily.   09/19/2020 at 0800  . Black Cohosh 40 MG CAPS Take 40 mg by mouth daily.   09/19/2020 at 0800  . cetirizine (ZYRTEC) 10 MG tablet Take 1 tablet (10 mg total) by mouth daily. 90 tablet 0 09/19/2020 at 0800  . EPIPEN 2-PAK 0.3 MG/0.3ML SOAJ injection Inject 0.3 mLs (0.3 mg total) into the muscle as directed. AS NEEDED FOR ANAPHYLAXIS 2 Device 0 prn at prn  . fluticasone (FLONASE) 50 MCG/ACT nasal spray Place 2 sprays into both nostrils daily. 16 g 0 09/19/2020 at 0800  . GLUCOSAMINE-CHONDROITIN DS PO Take 3,000 mg by mouth daily.   09/19/2020 at 0800  . losartan (COZAAR) 25 MG tablet Take 1 tablet (25 mg total) by mouth daily. 90 tablet 3 09/19/2020 at 0800  . Magnesium 250 MG TABS Take 250 mg by  mouth daily.   09/19/2020 at 0800  . montelukast (SINGULAIR) 10 MG tablet Take 10 mg by mouth at bedtime.    09/18/2020 at Unknown time  . Multiple Vitamin (MULTIVITAMIN WITH MINERALS) TABS tablet Take 1 tablet by mouth daily. One-A-Day Active 65+   09/19/2020 at 0800  . sertraline (ZOLOFT) 50 MG tablet Take 1 tablet (50 mg total) by mouth daily. 90 tablet 1 09/19/2020 at 0800   Scheduled: . apixaban  5 mg Oral BID  . aspirin  300 mg Rectal Daily   Or  . aspirin  325 mg Oral Daily  . atorvastatin  80 mg Oral Daily  . [START ON 09/20/2020] B-complex with vitamin C  1 tablet Oral Daily  . EPINEPHrine  0.3 mg Intramuscular UD  . [START ON 09/20/2020] fluticasone  2 spray Each Nare Daily  . [START ON 09/20/2020] loratadine  10 mg Oral Daily  . montelukast  10 mg Oral QHS  . [START ON 09/20/2020] multivitamin with minerals  1 tablet Oral Daily  . olopatadine  1 drop Both Eyes BID  . [START ON 09/20/2020] sertraline  50 mg Oral Daily  . sodium chloride flush  3 mL Intravenous Once    ROS: No N/V/D, pain on urinating, F/C. No vision changes, vertigo or headache. No recent falls. Other ROS as per HPI.   Physical Examination: Blood pressure (!) 159/75, pulse 67, temperature 97.9 F  (36.6 C), temperature source Oral, resp. rate 18, height 5\' 5"  (1.651 m), weight 105.2 kg, SpO2 99 %.  HEENT: [place]/AT Lungs: Respirations unlabored Ext: No edema  Neurologic Examination: Mental Status: Alert, oriented, thought content appropriate.  Speech fluent without evidence of aphasia.  Able to follow all commands without difficulty. Cranial Nerves: II:  Visual fields intact. PERRL.  III,IV, VI: No ptosis. EOMI.  V,VII: Smile symmetric, facial temp sensation normal bilaterally VIII: Hearing intact to voice IX,X: No hypophonia XI: Symmetric XII: midline tongue extension Motor: Right : Upper extremity   5/5    Left:     Upper extremity   5/5  Lower extremity   5/5     Lower extremity   5/5 Normal tone throughout; no atrophy noted Sensory: Temp and light touch intact throughout, bilaterally. No extinction to DSS.  Deep Tendon Reflexes:  Asymmetric reflexes:  2+ right brachioradialis and biceps 3+ left brachioradialis and biceps 4+ low-amplitude right patellar with crossed adductor to the left 1+ left patellar Cerebellar: No ataxia with FNF bilaterally  Gait: Deferred  Results for orders placed or performed during the hospital encounter of 09/19/20 (from the past 48 hour(s))  Protime-INR     Status: None   Collection Time: 09/19/20 10:30 AM  Result Value Ref Range   Prothrombin Time 13.9 11.4 - 15.2 seconds   INR 1.1 0.8 - 1.2    Comment: (NOTE) INR goal varies based on device and disease states. Performed at [facility], [address] [place], [place]   APTT     Status: None   Collection Time: 09/19/20 10:30 AM  Result Value Ref Range   aPTT 32 24 - 36 seconds    Comment: Performed at [facility], [address] [place], [place]  CBC     Status: None   Collection Time: 09/19/20 10:30 AM  Result Value Ref Range   WBC 7.7 4.0 - 10.5 K/uL   RBC 4.70 3.87 - 5.11 MIL/uL   Hemoglobin 14.2 12.0 - 15.0 g/dL   HCT  42.9 36 - 46  %   MCV 91.3 80.0 - 100.0 fL   MCH 30.2 26.0 - 34.0 pg   MCHC 33.1 30.0 - 36.0 g/dL   RDW 16.1 09.6 - 04.5 %   Platelets 196 150 - 400 K/uL   nRBC 0.0 0.0 - 0.2 %    Comment: Performed at [facility], [address] [place], [place] [address]  Differential     Status: Abnormal   Collection Time: 09/19/20 10:30 AM  Result Value Ref Range   Neutrophils Relative % 60 %   Neutro Abs 4.7 1.7 - 7.7 K/uL   Lymphocytes Relative 18 %   Lymphs Abs 1.3 0.7 - 4.0 K/uL   Monocytes Relative 16 %   Monocytes Absolute 1.2 (H) 0.1 - 1.0 K/uL   Eosinophils Relative 4 %   Eosinophils Absolute 0.3 0.0 - 0.5 K/uL   Basophils Relative 1 %   Basophils Absolute 0.1 0.0 - 0.1 K/uL   Immature Granulocytes 1 %   Abs Immature Granulocytes 0.05 0.00 - 0.07 K/uL    Comment: Performed at [facility], [address] [place], [place] [address]  Comprehensive metabolic panel     Status: Abnormal   Collection Time: 09/19/20 10:30 AM  Result Value Ref Range   Sodium 139 135 - 145 mmol/L   Potassium 4.4 3.5 - 5.1 mmol/L   Chloride 100 98 - 111 mmol/L   CO2 27 22 - 32 mmol/L   Glucose, Bld 116 (H) 70 - 99 mg/dL    Comment: Glucose reference range applies only to samples taken after fasting for at least 8 hours.   BUN 8 8 - 23 mg/dL   Creatinine, Ser 8.29 0.44 - 1.00 mg/dL   Calcium 9.4 8.9 - 56.2 mg/dL   Total Protein 7.1 6.5 - 8.1 g/dL   Albumin 4.1 3.5 - 5.0 g/dL   AST 27 15 - 41 U/L   ALT 25 0 - 44 U/L   Alkaline Phosphatase 51 38 - 126 U/L   Total Bilirubin 1.0 0.3 - 1.2 mg/dL   GFR, Estimated >13 >08 mL/min    Comment: (NOTE) Calculated using the CKD-EPI Creatinine Equation (2021)    Anion gap 12 5 - 15    Comment: Performed at [facility], [address], [place], [place] [address]  Resp Panel by RT-PCR (Flu A&B, Covid) Nasopharyngeal Swab     Status: None   Collection Time: 09/19/20 11:55 AM   Specimen: Nasopharyngeal Swab; Nasopharyngeal(NP) swabs in vial  transport medium  Result Value Ref Range   SARS Coronavirus 2 by RT PCR NEGATIVE NEGATIVE    Comment: (NOTE) SARS-CoV-2 target nucleic acids are NOT DETECTED.  The SARS-CoV-2 RNA is generally detectable in upper respiratory specimens during the acute phase of infection. The lowest concentration of SARS-CoV-2 viral copies this assay can detect is 138 copies/mL. A negative result does not preclude SARS-Cov-2 infection and should not be used as the sole basis for treatment or other patient management decisions. A negative result may occur with  improper specimen collection/handling, submission of specimen other than nasopharyngeal swab, presence of viral mutation(s) within the areas targeted by this assay, and inadequate number of viral copies(<138 copies/mL). A negative result must be combined with clinical observations, patient history, and epidemiological information. The expected result is Negative.  Fact Sheet for Patients:  BloggerCourse.com  Fact Sheet for Healthcare Providers:  SeriousBroker.it  This test is no t yet approved or cleared by the  [place] [employer] FDA and  has been authorized for detection and/or diagnosis of SARS-CoV-2 by FDA under an TEFL teachermergency Use Authorization (EUA). This EUA will remain  in effect (meaning this test can be used) for the duration of the COVID-19 declaration under Section 564(b)(1) of the Act, 21 U.S.C.section 360bbb-3(b)(1), unless the authorization is terminated  or revoked sooner.       Influenza A by PCR NEGATIVE NEGATIVE   Influenza B by PCR NEGATIVE NEGATIVE    Comment: (NOTE) The Xpert Xpress SARS-CoV-2/FLU/RSV plus assay is intended as an aid in the diagnosis of influenza from Nasopharyngeal swab specimens and should not be used as a sole basis for treatment. Nasal washings and aspirates are unacceptable for Xpert Xpress SARS-CoV-2/FLU/RSV testing.  Fact Sheet for  Patients: BloggerCourse.comhttps://www.fda.gov/media/152166/download  Fact Sheet for Healthcare Providers: SeriousBroker.ithttps://www.fda.gov/media/152162/download  This test is not yet approved or cleared by the [place] FDA and has been authorized for detection and/or diagnosis of SARS-CoV-2 by FDA under an Emergency Use Authorization (EUA). This EUA will remain in effect (meaning this test can be used) for the duration of the COVID-19 declaration under Section 564(b)(1) of the Act, 21 U.S.C. section 360bbb-3(b)(1), unless the authorization is terminated or revoked.  Performed at [facility], [address] [place], [place] [id number]    CT HEAD WO CONTRAST  Result Date: 09/19/2020 CLINICAL DATA:  Dizziness EXAM: CT HEAD WITHOUT CONTRAST TECHNIQUE: Contiguous axial images were obtained from the base of the skull through the vertex without intravenous contrast. COMPARISON:  None. FINDINGS: Brain: There is no acute intracranial hemorrhage, mass effect, or edema. Gray-white differentiation is preserved. There is no extra-axial fluid collection. Ventricles and sulci are within normal limits in size and configuration. Vascular: No hyperdense vessel or unexpected calcification. Skull: Calvarium is unremarkable. Sinuses/Orbits: No acute finding. Other: None. IMPRESSION: No acute intracranial abnormality Electronically Signed   By: Guadlupe SpanishPraneil  Patel M.D.   On: 09/19/2020 11:22   MR BRAIN WO CONTRAST  Result Date: 09/19/2020 CLINICAL DATA:  Neuro deficit, acute stroke suspected EXAM: MRI HEAD WITHOUT CONTRAST TECHNIQUE: Multiplanar, multiecho pulse sequences of the brain and surrounding structures were obtained without intravenous contrast. COMPARISON:  Same day CT head. FINDINGS: Brain: Small infarcts in the left insula and overlying frontal operculum. Possible punctate additional infarct in the left corona radiata. Mild edema without mass effect. Focus of susceptibility artifact in the adjacent left sylvian  fissure. (Series 11, image 28). No correlate hemorrhage on same day CT. No evidence of acute hemorrhage. Dilated perivascular space in the inferior left basal ganglia. Additional scattered T2/FLAIR hyperintensities, likely related to chronic microvascular ischemic disease. No hydrocephalus. No midline shift. Basal cisterns are patent. Vascular: Major arterial flow voids are maintained at the skull base. Skull and upper cervical spine: Normal marrow signal. Sinuses/Orbits: Negative. Other: No mastoid effusions. IMPRESSION: 1. Small infarcts in the left insula and overlying frontal operculum. Possible punctate additional infarct in the left corona radiata. Mild associated edema without mass effect. 2. Focus of susceptibility artifact in the adjacent left sylvian fissure. While nonspecific, this could conceivably represent focal clot in a left M2/M3 MCA branch. No evidence of acute hemorrhage in this region on same day CT. Consider correlation with CTA. These results will be called to the ordering clinician or representative by the Radiologist Assistant, and communication documented in the PACS or [employer] Dashboard. Electronically Signed   By: Feliberto HartsFrederick S Jones MD   On: 09/19/2020 14:03    Assessment: 74 y.o. female presenting after an  episode of acute right sided flaccid weakness. MRI reveals small strokes in the left MCA territory.  1. Exam reveals asymmetric reflexes, brisk on the left. This is unexpected given that the small strokes seen on MRI are ipsilateral to the hyperreflexia.  2. MRI brain images reviewed by Neurology in conjunction with review of official Radiology report: Small infarcts in the left insula and overlying frontal operculum. Possible punctate additional infarct in the left corona radiata. Mild associated edema without mass effect. Focus of susceptibility artifact in the adjacent left sylvian fissure appears most likely to represent a focal clot in a left M2/M3 MCA branch. 3. Overall  impression is that her initial weakness was due to a proximal LVO in the left MCA M1 or an M2 branch, followed by spontaneous recanalization with residual small clot visible on MRI and resultant small foci of ischemic infarction in what was most likely an initially significantly larger territory at risk. Etiology is felt most likely to be transient increase in coagulability relative to her baseline on DOAC, after missing a dose of Eliquis 4. Stroke Risk Factors - Atrial fibrillation with missed dose of Eliquis, OSA and obesity 5. 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 Apparent moderate/severe stenosis of the pre-cavernous/proximal cavernous right internal carotid artery. The left A2/A3 anterior cerebral artery is markedly dominant on a developmental basis. 6. Not a candidate for thrombectomy due to NIHSS of 0  Recommendations: 1. HgbA1c, fasting lipid panel 2. Modified permissive HTN protocol x 24 hours (older in age with increased risk of hemorrhagic conversion, a risk further increased by anticoagulation). Treat if SBP > 180.  3. PT consult, OT consult, Speech consult 4. TTE 5. Cardiac telemetry 6. Prophylactic therapy-Continue Eliquis and atorvastatin. Discontinue ASA. She has been advised to not miss doses of Eliquis in the event of a relatively minor bleed if a laceration or other superficial bleed should occur in the future. For uncontrollable bleeding that may occur in the future, she should present immediately to the ED and decision to hold or continue Eliquis should be made while in the hospital setting.  7. Risk factor modification 8. Frequent neuro checks  @Electronically  signed: Dr. 09/19/2020, 3:12 PM

## 2020-09-20 DIAGNOSIS — F419 Anxiety disorder, unspecified: Secondary | ICD-10-CM

## 2020-09-20 DIAGNOSIS — I482 Chronic atrial fibrillation, unspecified: Secondary | ICD-10-CM

## 2020-09-20 DIAGNOSIS — F3342 Major depressive disorder, recurrent, in full remission: Secondary | ICD-10-CM

## 2020-09-20 DIAGNOSIS — K219 Gastro-esophageal reflux disease without esophagitis: Secondary | ICD-10-CM

## 2020-09-20 DIAGNOSIS — E669 Obesity, unspecified: Secondary | ICD-10-CM

## 2020-09-20 LAB — LIPID PANEL
Cholesterol: 94 mg/dL (ref 0–200)
HDL: 43 mg/dL (ref >40)
LDL Cholesterol: 33 mg/dL (ref 0–99)
Total CHOL/HDL Ratio: 2.2 RATIO
Triglycerides: 90 mg/dL (ref <150)
VLDL: 18 mg/dL (ref 0–40)

## 2020-09-20 LAB — HEMOGLOBIN A1C
Hgb A1c MFr Bld: 5.7 % — ABNORMAL HIGH (ref 4.8–5.6)
Mean Plasma Glucose: 116.89 mg/dL

## 2020-09-20 LAB — ECHOCARDIOGRAM COMPLETE
Height: 65 in
S' Lateral: 2.36 cm
Weight: 3712.02 oz

## 2020-09-20 MED ORDER — ATORVASTATIN CALCIUM 80 MG PO TABS: 80.0000 mg | ORAL_TABLET | Freq: Every day | ORAL | 0 refills | Status: DC

## 2020-09-20 NOTE — Care Management Obs Status (Signed)
MEDICARE OBSERVATION STATUS NOTIFICATION   Patient Details  Name: Crystal Haas MRN: [id number] Date of Birth: 07-Nov-1945   Medicare Observation Status Notification Given:  Yes    Allayne Butcher, RN 09/20/2020, 12:59 PM

## 2020-09-20 NOTE — Progress Notes (Signed)
Pt dc instructions given, verbalizes understanding she still needs to make f/u appts. Flu shot given as per eMar. Pt taken out via wc in apparent stable condition

## 2020-09-20 NOTE — Discharge Summary (Signed)
Physician Discharge Summary  Crystal Haas RUE:[id number] DOB: 06-10-1946 DOA: 09/19/2020  PCP: Glori Luis, MD  Admit date: 09/19/2020 Discharge date: 09/20/2020  Admitted From: Home  Discharge disposition: home  Recommendations for Outpatient Follow-Up:   . Follow up with your primary care provider in one week.  . Check CBC, BMP, magnesium in the next visit . Follow up with [facility] neurology in 2-3 weeks for strokeFollow-up.   Discharge Diagnosis:   Principal Problem:   CVA (cerebral vascular accident) [facility]) Active Problems:   Depression   Chronic atrial fibrillation (HCC)   Anxiety   GERD (gastroesophageal reflux disease)   Obesity (BMI 30-39.9)   Discharge Condition: Improved.  Diet recommendation: Low sodium, heart healthy.    Wound care: None.  Code status: Full.   History of Present Illness:   Crystal Haas is a 74 y.o. female with medical history significant for morbid obesity (BMI 38), history of atrial fibrillation on anticoagulation with Eliquis, history of anxiety,depression, obstructive sleep apnea on CPAP and GERD who presented to the emergency room with complaints of numbness and weakness over the right arm and leg that started suddenly around 2:30 AM on the morning of her admission.  Patient states that she woke up from sleep and noticed that she was unable to move her right arm or leg and could not get up to go to the bathroom and so she urinated in her bed.  Her symptoms lasted about 20 minutes and resolved. She called her cardiologist office and one on the phone did notice that her speech was slurred and advised her to come straight to the emergency room for evaluation.  In the ED labs were within normal range. Respiratory viral panel was negative .CT scan of the head without contrast shows no acute abnormality MRI of the brain shows small infarcts in the left insula and overlying frontal operculum. Possible punctate additional infarct in the left corona  radiata. Mild associated edema without mass effect.  EKG showed atrial fibrillation.  Patient was then placed in observation to the hospital for further evaluation.   Hospital Course:   Following conditions were addressed during hospitalization as listed below,  Acute CVA Presenting complaint was right-sided numbness and weakness which resolved by itself.  MRI showed infarcts.  MRA of the brain showed occluded M2 left middle cerebral patient with moderate to severe stenosis of the proximal cavernous right ICA.  Neurology was consulted and recommended increasing dose of Lipitor.  Patient was already on Eliquis which will be continued on discharge.  2D echocardiogram showed preserved LV function with indeterminate diastolic parameters. Carotid duplex ultrasound was negative for hemodynamically significant stenosis.  Patient was also seen by PT OT speech therapy and did not require any skilled therapy. Spoke with neurology Dr. Otelia Limes regarding the patient's management.  Recommended follow-up with neurology as outpatient and no further work-up.  Referral given to [facility] neurology as outpatient.  Obesity (BMI 38) Weight loss as outpatient beneficial.  Chronic atrial fibrillation Rate controlled,Continue Eliquis  Anxiety and depression Controlled.  Continue sertraline  Disposition.  At this time, patient is stable for disposition home.  She will have to follow-up with her primary care physician and neurology as outpatient.  Medical Consultants:    neurology  Procedures:    None Subjective:   Today, patient was seen and examined at bedside.  Denies any numbness or slurred speech.  No focal weaknesses.  Discharge Exam:   Vitals:   09/20/20 0750 09/20/20 1117  BP: Marland Kitchen)  158/66 (!) 159/83  Pulse: 60 63  Resp: 19 20  Temp: 97.6 F (36.4 C) 98.4 F (36.9 C)  SpO2: 96% 94%   Vitals:   09/20/20 0203 09/20/20 0415 09/20/20 0750 09/20/20 1117  BP: (!) 160/74 139/66 (!) 158/66 (!)  159/83  Pulse: 67 70 60 63  Resp: 16 20 19 20   Temp: 98.2 F (36.8 C) 98 F (36.7 C) 97.6 F (36.4 C) 98.4 F (36.9 C)  TempSrc: Oral Oral Oral Oral  SpO2: 97% 97% 96% 94%  Weight:      Height:       Body mass index is 38.61 kg/m. General: Alert awake, not in obvious distress, obese HENT: pupils equally reacting to light,  No scleral pallor or icterus noted. Oral mucosa is moist.  Chest:  Clear breath sounds.  Diminished breath sounds bilaterally. No crackles or wheezes.  CVS: S1 &S2 heard. No murmur.  Irregular rhythm.   Abdomen: Soft, nontender, nondistended.  Bowel sounds are heard.   Extremities: No cyanosis, clubbing or edema.  Peripheral pulses are palpable. Psych: Alert, awake and oriented, normal mood CNS:  No cranial nerve deficits.  Power equal in all extremities.   Skin: Warm and dry.  No rashes noted.  The results of significant diagnostics from this hospitalization (including imaging, microbiology, ancillary and laboratory) are listed below for reference.     Diagnostic Studies:   CT HEAD WO CONTRAST  Result Date: 09/19/2020 CLINICAL DATA:  Dizziness EXAM: CT HEAD WITHOUT CONTRAST TECHNIQUE: Contiguous axial images were obtained from the base of the skull through the vertex without intravenous contrast. COMPARISON:  None. FINDINGS: Brain: There is no acute intracranial hemorrhage, mass effect, or edema. Gray-white differentiation is preserved. There is no extra-axial fluid collection. Ventricles and sulci are within normal limits in size and configuration. Vascular: No hyperdense vessel or unexpected calcification. Skull: Calvarium is unremarkable. Sinuses/Orbits: No acute finding. Other: None. IMPRESSION: No acute intracranial abnormality Electronically Signed   By: 09/21/2020 M.D.   On: 09/19/2020 11:22   MR ANGIO HEAD WO CONTRAST  Result Date: 09/19/2020 CLINICAL DATA:  Neuro deficit, acute, stroke suspected. EXAM: MRA HEAD WITHOUT CONTRAST TECHNIQUE:  Angiographic images of the Circle of Willis were obtained using MRA technique without intravenous contrast. COMPARISON:  SAME DAY brain MRI 09/19/2020. FINDINGS: The intracranial internal carotid arteries are patent. Apparent moderate/severe stenosis of the pre-cavernous/proximal cavernous right ICA (series 1063, image 14). The M1 middle cerebral arteries are patent. There is occlusion of a proximal M2 left MCA branch vessel (series 5, image 116) (series 1063, image 5). No right M2 proximal branch occlusion or high-grade proximal stenosis is identified. The anterior cerebral arteries are patent. The left A2/A3 anterior cerebral artery is markedly dominant. The intracranial vertebral arteries are patent. The basilar artery is patent. The posterior cerebral arteries are patent. Posterior communicating arteries are hypoplastic or absent bilaterally. No intracranial aneurysm is identified. IMPRESSION: Occluded proximal M2 left middle cerebral artery vessel. Apparent moderate/severe stenosis of the pre-cavernous/proximal cavernous right internal carotid artery. The left A2/A3 anterior cerebral artery is markedly dominant on a developmental basis. Electronically Signed   By: 09/21/2020 DO   On: 09/19/2020 17:03   MR ANGIO NECK WO CONTRAST  Result Date: 09/19/2020 CLINICAL DATA:  Neuro deficit, acute, stroke suspected. EXAM: MRA NECK WITHOUT CONTRAST TECHNIQUE: Angiographic images of the neck were obtained using MRA technique without intravenous contrast. Carotid stenosis measurements (when applicable) are obtained utilizing NASCET criteria, using the distal internal carotid  diameter as the denominator. COMPARISON:  Same day carotid artery duplex 09/19/2020. Same-day brain MRI 09/19/2020. FINDINGS: The proximal common carotid arteries, vertebral artery origins and proximal V1 segments andportions of the distal cervical internal carotid and vertebral arteries are excluded from the field of view. The visualized  bilateral common carotid, internal carotid and vertebral arteries are patent within the neck without hemodynamically significant stenosis (50% or greater). Mild atherosclerotic plaque is present within the proximal right ICA (series 1078, image 9). Partially retropharyngeal course of the internal carotid arteries. IMPRESSION: The proximal common carotid arteries, vertebral artery origins and proximal V1 segments, and portions of the distal cervical internal carotid and vertebral arteries are excluded from the field of view. The visualized common carotid, internal carotid and vertebral arteries are patent within the neck without significant stenosis. Mild atherosclerotic plaque within the proximal right ICA. Electronically Signed   By: Jackey Loge DO   On: 09/19/2020 17:09   MR BRAIN WO CONTRAST  Result Date: 09/19/2020 CLINICAL DATA:  Neuro deficit, acute stroke suspected EXAM: MRI HEAD WITHOUT CONTRAST TECHNIQUE: Multiplanar, multiecho pulse sequences of the brain and surrounding structures were obtained without intravenous contrast. COMPARISON:  Same day CT head. FINDINGS: Brain: Small infarcts in the left insula and overlying frontal operculum. Possible punctate additional infarct in the left corona radiata. Mild edema without mass effect. Focus of susceptibility artifact in the adjacent left sylvian fissure. (Series 11, image 28). No correlate hemorrhage on same day CT. No evidence of acute hemorrhage. Dilated perivascular space in the inferior left basal ganglia. Additional scattered T2/FLAIR hyperintensities, likely related to chronic microvascular ischemic disease. No hydrocephalus. No midline shift. Basal cisterns are patent. Vascular: Major arterial flow voids are maintained at the skull base. Skull and upper cervical spine: Normal marrow signal. Sinuses/Orbits: Negative. Other: No mastoid effusions. IMPRESSION: 1. Small infarcts in the left insula and overlying frontal operculum. Possible punctate  additional infarct in the left corona radiata. Mild associated edema without mass effect. 2. Focus of susceptibility artifact in the adjacent left sylvian fissure. While nonspecific, this could conceivably represent focal clot in a left M2/M3 MCA branch. No evidence of acute hemorrhage in this region on same day CT. Consider correlation with CTA. These results will be called to the ordering clinician or representative by the Radiologist Assistant, and communication documented in the PACS or [employer]. Electronically Signed   By: Feliberto Harts MD   On: 09/19/2020 14:03   US Carotid Bilateral (at [facility] and [facility] only)  Result Date: 09/19/2020 CLINICAL DATA:  TIA symptoms, hyperlipidemia, hypertension EXAM: BILATERAL CAROTID DUPLEX ULTRASOUND TECHNIQUE: Wallace Cullens scale imaging, color Doppler and duplex ultrasound were performed of bilateral carotid and vertebral arteries in the neck. COMPARISON:  None. FINDINGS: Criteria: Quantification of carotid stenosis is based on velocity parameters that correlate the residual internal carotid diameter with NASCET-based stenosis levels, using the diameter of the distal internal carotid lumen as the denominator for stenosis measurement. The following velocity measurements were obtained: RIGHT ICA: 68/15 cm/sec CCA: 85/18 cm/sec SYSTOLIC ICA/CCA RATIO:  1.1 ECA: 102 cm/sec LEFT ICA: 61/19 cm/sec CCA: 86/11 cm/sec SYSTOLIC ICA/CCA RATIO:  0.7 ECA: 107 cm/sec RIGHT CAROTID ARTERY: Minor atherosclerotic plaque formation. No hemodynamically significant right ICA stenosis, velocity elevation, or turbulent flow. Degree of narrowing less than 50%. RIGHT VERTEBRAL ARTERY:  Normal antegrade flow LEFT CAROTID ARTERY: Similar scattered minor plaque formation. No hemodynamically significant left ICA stenosis, velocity elevation, or turbulent flow. LEFT VERTEBRAL ARTERY:  Normal antegrade flow IMPRESSION: Minor  carotid atherosclerosis. No hemodynamically significant ICA stenosis. Degree  of narrowing less than 50% bilaterally by ultrasound criteria. Patent antegrade vertebral flow bilaterally Electronically Signed   By: Judie Petit.  Shick M.D.   On: 09/19/2020 16:37   ECHOCARDIOGRAM COMPLETE  Result Date: 09/20/2020    ECHOCARDIOGRAM REPORT   Patient Name:   Crystal Haas Date of Exam: 09/19/2020 Medical Rec #:  [id number]   Height:       65.0 in Accession #:    [id number]  Weight:       232.0 lb Date of Birth:  1946-08-04   BSA:          2.107 m Patient Age:    74 years    BP:           145/65 mmHg Patient Gender: F           HR:           64 bpm. Exam Location:  [facility] Procedure: 2D Echo, Color Doppler and Cardiac Doppler Indications:     I163.9 Stroke  History:         Patient has prior history of Echocardiogram examinations, most                  recent 05/11/2015. Obstructive sleep apnea-CPAP. Chronic atrial                  fibrillation. Murmur. Bilateral breast reduction.  Sonographer:     Sedonia Small Rodgers-Jones Referring Phys:  [id number] AGBATA Diagnosing Phys: Debbe Odea MD IMPRESSIONS  1. Left ventricular ejection fraction, by estimation, is 60 to 65%. The left ventricle has normal function. The left ventricle has no regional wall motion abnormalities. Left ventricular diastolic parameters are indeterminate.  2. Right ventricular systolic function is normal. The right ventricular size is normal.  3. Left atrial size was moderately dilated.  4. Right atrial size was mildly dilated.  5. The mitral valve is normal in structure. Trivial mitral valve regurgitation.  6. The aortic valve was not well visualized. Aortic valve regurgitation is not visualized. Mild aortic valve sclerosis is present, with no evidence of aortic valve stenosis. Conclusion(s)/Recommendation(s): No intracardiac source of embolism detected on this transthoracic study. A transesophageal echocardiogram is recommended to exclude cardiac source of embolism if clinically indicated. FINDINGS  Left Ventricle: Left ventricular  ejection fraction, by estimation, is 60 to 65%. The left ventricle has normal function. The left ventricle has no regional wall motion abnormalities. The left ventricular internal cavity size was normal in size. There is  no left ventricular hypertrophy. Left ventricular diastolic parameters are indeterminate. Right Ventricle: The right ventricular size is normal. No increase in right ventricular wall thickness. Right ventricular systolic function is normal. Left Atrium: Left atrial size was moderately dilated. Right Atrium: Right atrial size was mildly dilated. Pericardium: There is no evidence of pericardial effusion. Mitral Valve: The mitral valve is normal in structure. Trivial mitral valve regurgitation. Tricuspid Valve: The tricuspid valve is normal in structure. Tricuspid valve regurgitation is mild. Aortic Valve: The aortic valve was not well visualized. Aortic valve regurgitation is not visualized. Mild aortic valve sclerosis is present, with no evidence of aortic valve stenosis. Pulmonic Valve: The pulmonic valve was normal in structure. Pulmonic valve regurgitation is not visualized. Aorta: The aortic root is normal in size and structure. IAS/Shunts: No atrial level shunt detected by color flow Doppler.  LEFT VENTRICLE PLAX 2D LVIDd:         4.50 cm LVIDs:  2.36 cm LV PW:         0.97 cm LV IVS:        0.96 cm LVOT diam:     1.80 cm LV SV:         51 LV SV Index:   24 LVOT Area:     2.54 cm  RIGHT VENTRICLE             IVC RV Basal diam:  3.96 cm     IVC diam: 1.98 cm RV S prime:     12.50 cm/s TAPSE (M-mode): 2.0 cm LEFT ATRIUM              Index       RIGHT ATRIUM           Index LA diam:        5.30 cm  2.52 cm/m  RA Area:     19.90 cm LA Vol (A2C):   109.0 ml 51.73 ml/m RA Volume:   60.40 ml  28.67 ml/m LA Vol (A4C):   82.0 ml  38.92 ml/m LA Biplane Vol: 95.3 ml  45.23 ml/m  AORTIC VALVE LVOT Vmax:   100.45 cm/s LVOT Vmean:  75.000 cm/s LVOT VTI:    0.200 m  AORTA Ao Root diam: 2.70 cm  MV E velocity: 123.00 cm/s  TRICUSPID VALVE                             TR Peak grad:   33.9 mmHg                             TR Vmax:        291.00 cm/s                              SHUNTS                             Systemic VTI:  0.20 m                             Systemic Diam: 1.80 cm Debbe Odea MD Electronically signed by Debbe Odea MD Signature Date/Time: 09/20/2020/12:10:16 PM    Final      Labs:   Basic Metabolic Panel: Recent Labs  Lab 09/19/20 1030  NA 139  K 4.4  CL 100  CO2 27  GLUCOSE 116*  BUN 8  CREATININE 0.97  CALCIUM 9.4   GFR Estimated Creatinine Clearance: 62.2 mL/min (by C-G formula based on SCr of 0.97 mg/dL). Liver Function Tests: Recent Labs  Lab 09/19/20 1030  AST 27  ALT 25  ALKPHOS 51  BILITOT 1.0  PROT 7.1  ALBUMIN 4.1   No results for input(s): LIPASE, AMYLASE in the last 168 hours. No results for input(s): AMMONIA in the last 168 hours. Coagulation profile Recent Labs  Lab 09/19/20 1030  INR 1.1    CBC: Recent Labs  Lab 09/19/20 1030  WBC 7.7  NEUTROABS 4.7  HGB 14.2  HCT 42.9  MCV 91.3  PLT 196   Cardiac Enzymes: No results for input(s): CKTOTAL, CKMB, CKMBINDEX, TROPONINI in the last 168 hours. BNP: Invalid input(s): POCBNP CBG: No results for input(s): GLUCAP in the last 168 hours. D-Dimer No results  for input(s): DDIMER in the last 72 hours. Hgb A1c Recent Labs    09/20/20 0632  HGBA1C 5.7*   Lipid Profile Recent Labs    09/20/20 0632  CHOL 94  HDL 43  LDLCALC 33  TRIG 90  CHOLHDL 2.2   Thyroid function studies No results for input(s): TSH, T4TOTAL, T3FREE, THYROIDAB in the last 72 hours.  Invalid input(s): FREET3 Anemia work up No results for input(s): VITAMINB12, FOLATE, FERRITIN, TIBC, IRON, RETICCTPCT in the last 72 hours. Microbiology Recent Results (from the past 240 hour(s))  Resp Panel by RT-PCR (Flu A&B, Covid) Nasopharyngeal Swab     Status: None   Collection Time: 09/19/20  11:55 AM   Specimen: Nasopharyngeal Swab; Nasopharyngeal(NP) swabs in vial transport medium  Result Value Ref Range Status   SARS Coronavirus 2 by RT PCR NEGATIVE NEGATIVE Final    Comment: (NOTE) SARS-CoV-2 target nucleic acids are NOT DETECTED.  The SARS-CoV-2 RNA is generally detectable in upper respiratory specimens during the acute phase of infection. The lowest concentration of SARS-CoV-2 viral copies this assay can detect is 138 copies/mL. A negative result does not preclude SARS-Cov-2 infection and should not be used as the sole basis for treatment or other patient management decisions. A negative result may occur with  improper specimen collection/handling, submission of specimen other than nasopharyngeal swab, presence of viral mutation(s) within the areas targeted by this assay, and inadequate number of viral copies(<138 copies/mL). A negative result must be combined with clinical observations, patient history, and epidemiological information. The expected result is Negative.  Fact Sheet for Patients:  BloggerCourse.comhttps://www.fda.gov/media/152166/download  Fact Sheet for Healthcare Providers:  SeriousBroker.ithttps://www.fda.gov/media/152162/download  This test is no t yet approved or cleared by the [place] FDA and  has been authorized for detection and/or diagnosis of SARS-CoV-2 by FDA under an Emergency Use Authorization (EUA). This EUA will remain  in effect (meaning this test can be used) for the duration of the COVID-19 declaration under Section 564(b)(1) of the Act, 21 U.S.C.section 360bbb-3(b)(1), unless the authorization is terminated  or revoked sooner.       Influenza A by PCR NEGATIVE NEGATIVE Final   Influenza B by PCR NEGATIVE NEGATIVE Final    Comment: (NOTE) The Xpert Xpress SARS-CoV-2/FLU/RSV plus assay is intended as an aid in the diagnosis of influenza from Nasopharyngeal swab specimens and should not be used as a sole basis for treatment. Nasal washings and aspirates  are unacceptable for Xpert Xpress SARS-CoV-2/FLU/RSV testing.  Fact Sheet for Patients: BloggerCourse.comhttps://www.fda.gov/media/152166/download  Fact Sheet for Healthcare Providers: SeriousBroker.ithttps://www.fda.gov/media/152162/download  This test is not yet approved or cleared by the [place] FDA and has been authorized for detection and/or diagnosis of SARS-CoV-2 by FDA under an Emergency Use Authorization (EUA). This EUA will remain in effect (meaning this test can be used) for the duration of the COVID-19 declaration under Section 564(b)(1) of the Act, 21 U.S.C. section 360bbb-3(b)(1), unless the authorization is terminated or revoked.  Performed at [facility], [address] [place], [place] [address]      Discharge Instructions:   Discharge Instructions     Diet - low sodium heart healthy   Complete by: As directed    Discharge instructions   Complete by: As directed    [facility] in [facility]-3 weeks for a stroke follow-up.  Reviewed primary care physician and your Cardiology as scheduled by you.   Increase activity slowly   Complete by: As directed  Allergies as of 09/20/2020       Reactions   Ivp Dye [iodinated Diagnostic Agents] Shortness Of Breath   Also swelling.  Topical betadine is OK.   Iodine Swelling   IV    Tape Other (See Comments)   Most tapes case raw skin.  Paper tape is OK.        Medication List     TAKE these medications    apixaban 5 MG Tabs tablet Commonly known as: Eliquis Take 1 tablet (5 mg total) by mouth 2 (two) times daily.   atorvastatin 80 MG tablet Commonly known as: LIPITOR Take 1 tablet (80 mg total) by mouth at bedtime. What changed:   medication strength  how much to take  how to take this  when to take this  additional instructions   azelastine 0.05 % ophthalmic solution Commonly known as: OPTIVAR Place 1 drop into both eyes 2 (two) times daily.   B-complex with vitamin C  tablet Take 1 tablet by mouth daily.   Black Cohosh 40 MG Caps Take 40 mg by mouth daily.   cetirizine 10 MG tablet Commonly known as: ZYRTEC Take 1 tablet (10 mg total) by mouth daily.   EpiPen 2-Pak 0.3 mg/0.3 mL Soaj injection Generic drug: EPINEPHrine Inject 0.3 mLs (0.3 mg total) into the muscle as directed. AS NEEDED FOR ANAPHYLAXIS   fluticasone 50 MCG/ACT nasal spray Commonly known as: FLONASE Place 2 sprays into both nostrils daily.   GLUCOSAMINE-CHONDROITIN DS PO Take 3,000 mg by mouth daily.   losartan 25 MG tablet Commonly known as: COZAAR Take 1 tablet (25 mg total) by mouth daily.   Magnesium 250 MG Tabs Take 250 mg by mouth daily.   montelukast 10 MG tablet Commonly known as: SINGULAIR Take 10 mg by mouth at bedtime.   multivitamin with minerals Tabs tablet Take 1 tablet by mouth daily. One-A-Day Active 65+   sertraline 50 MG tablet Commonly known as: ZOLOFT Take 1 tablet (50 mg total) by mouth daily.        Follow-up Information     Lonell Face, MD. Schedule an appointment as soon as possible for a visit in 2 week(s).   Specialty: Neurology Why: stroke followup Contact information: [address] [place] [address] [phone]         Glori Luis, MD. Schedule an appointment as soon as possible for a visit in 1 week(s).   Specialty: Family Medicine Why: regular followup Contact information: [address] [place] [address] [phone]         Iran Ouch, MD .   Specialty: Cardiology Contact information: [address] [place] [address] [phone]                  Time coordinating discharge: 39 minutes  Signed:  Louretta Tantillo  [facility] 09/20/2020, 2:01 PM

## 2020-09-20 NOTE — Progress Notes (Signed)
Pt spoke with Dr. Otelia Limes on the phone waiting for dc orders

## 2020-09-20 NOTE — Plan of Care (Signed)
?  Problem: Nutrition: ?Goal: Dietary intake will improve ?Outcome: Progressing ?  ?

## 2020-09-20 NOTE — Evaluation (Signed)
Occupational Therapy Evaluation Patient Details Name: Crystal Haas MRN: [id number] DOB: 1946/02/10 Today's Date: 09/20/2020    History of Present Illness a 74 y.o. female with medical history significant for morbid obesity (BMI 38), history of atrial fibrillation on anticoagulation with Eliquis, history of anxiety, history of depression, history of obstructive sleep apnea on CPAP and GERD who presents to the emergency room via private vehicle for evaluation of numbness and weakness involving her right arm and leg that she noticed around 2:30 AM on the morning of her admission.  Patient states that she woke up from sleep and noticed that she was unable to move her right arm or leg and could not get up to go to the bathroom and so she urinated in her bed.  Her symptoms lasted about 20 minutes and resolved.   Clinical Impression   Pt reports living at home alone PTA without use of AD and independent in all aspects of self care and IADLs. Pt still drives. Pt tearful at times and reports being concerned over CVA diagnosis with questions for neurology. Pt demonstrated functional mobility with self care tasks without assistance/AD and no LOB. OT educated pt on secondary stroke risk and directed her to stroke handbook for further education as well. Pt with no further questions at this time. OT to SIGN OFF and no follow up needed at this time.     Follow Up Recommendations  No OT follow up    Equipment Recommendations  None recommended by OT       Precautions / Restrictions Precautions Precautions: None Restrictions Weight Bearing Restrictions: No      Mobility Bed Mobility Overal bed mobility: Independent     General bed mobility comments: seated in recliner chair upon entering the room    Transfers Overall transfer level: Modified independent Equipment used: None       General transfer comment: increased time    Balance Overall balance assessment: Modified Independent        ADL  either performed or assessed with clinical judgement   ADL Overall ADL's : Modified independent        General ADL Comments: increased time to complete task but overall demonstrates safety with self care tasks and functional mobility without use of AD     Vision Baseline Vision/History: Wears glasses Wears Glasses: Reading only Patient Visual Report: No change from baseline              Pertinent Vitals/Pain Pain Assessment: No/denies pain     Hand Dominance Right   Extremity/Trunk Assessment Upper Extremity Assessment Upper Extremity Assessment: Overall WFL for tasks assessed   Lower Extremity Assessment Lower Extremity Assessment: Overall WFL for tasks assessed       Communication Communication Communication: No difficulties   Cognition Arousal/Alertness: Awake/alert Behavior During Therapy: WFL for tasks assessed/performed Overall Cognitive Status: Within Functional Limits for tasks assessed \       \           Home Living Family/patient expects to be discharged to:: Private residence Living Arrangements: Alone Available Help at Discharge: Friend(s);Family;Available PRN/intermittently Type of Home: House Home Access: Stairs to enter [employer] of Steps: 3 Entrance Stairs-Rails: Right Home Layout: One level     Bathroom Shower/Tub: Chief Strategy Officer: Handicapped height     Home Equipment: None          Prior Functioning/Environment Level of Independence: Independent        Comments: pt drives, euns errands,  etc        \         OT Goals(Current goals can be found in the care plan section) Acute Rehab OT Goals Patient Stated Goal: go home today OT Goal Formulation: With patient  OT Frequency:     \        \   AM-PAC OT "6 Clicks" Daily Activity     Outcome Measure Help from another person eating meals?: None Help from another person taking care of personal grooming?: None Help from another person  toileting, which includes using toliet, bedpan, or urinal?: None Help from another person bathing (including washing, rinsing, drying)?: None Help from another person to put on and taking off regular upper body clothing?: None Help from another person to put on and taking off regular lower body clothing?: None 6 Click Score: 24   End of Session Nurse Communication: Mobility status  Activity Tolerance: Patient tolerated treatment well Patient left: in chair;with call bell/phone within reach                   Time: 0981-1914 OT Time Calculation (min): 18 min Charges:  OT General Charges $OT Visit: 1 Visit OT Evaluation $OT Eval Low Complexity: 1 Low OT Treatments $Therapeutic Activity: 8-22 mins  Jackquline Denmark, MS, OTR/L , CBIS ascom [phone]  09/20/20, 12:13 PM

## 2020-09-20 NOTE — Evaluation (Signed)
Physical Therapy Evaluation Patient Details Name: Crystal Haas MRN: [id number] DOB: [birth year] Today's Date: 09/20/2020   History of Present Illness  a 74 y.o. female with medical history significant for morbid obesity (BMI 38), history of atrial fibrillation on anticoagulation with Eliquis, history of anxiety, history of depression, history of obstructive sleep apnea on CPAP and GERD who presents to the emergency room via private vehicle for evaluation of numbness and weakness involving her right arm and leg that she noticed around 2:30 AM on the morning of her admission.  Patient states that she woke up from sleep and noticed that she was unable to move her right arm or leg and could not get up to go to the bathroom and so she urinated in her bed.  Her symptoms lasted about 20 minutes and resolved.  Clinical Impression  Pt did well with ambulation, mobility, safety, balance, etc with no issues.  She reports feeling back to baseline and was indeed independent with all aspects of PT exam.  She had some fatigue with 250 ft of ambulation (+ steps) and reports that steps do generally fatigue her out significantly.  Pt lives alone but has family and friends that can help should be require this.  Pt is safe and appropriate to return home alone from PT stand-point, no further needs.      Follow Up Recommendations No PT follow up    Equipment Recommendations  None recommended by PT    Recommendations for Other Services       Precautions / Restrictions Precautions Precautions: None Restrictions Weight Bearing Restrictions: No      Mobility  Bed Mobility Overal bed mobility: Independent                  Transfers Overall transfer level: Independent               General transfer comment: easily and confidently gets to standing   Ambulation/Gait Ambulation/Gait assistance: Modified independent (Device/Increase time) Gait Distance (Feet): 250 Feet Assistive device: None        General Gait Details: Pt is able to maintain consistent and confident cadence with only minimal fatigue on prolonged bout of ambulation. (HR ~110, O2 remains in 90s on room air).  Pt reports being at baseline and showed no issues t/o the effort.   Stairs Stairs: Yes Stairs assistance: Modified independent (Device/Increase time) Stair Management: One rail Right Number of Stairs: 5 General stair comments: Pt negotiates up/down steps w/o hesitation or issue  Wheelchair Mobility    Modified Rankin (Stroke Patients Only)       Balance                                             Pertinent Vitals/Pain Pain Assessment: No/denies pain    Home Living Family/patient expects to be discharged to:: Private residence Living Arrangements: Alone Available Help at Discharge: Friend(s);Family;Available PRN/intermittently   Home Access: Stairs to enter Entrance Stairs-Rails: Right Entrance Stairs-Number of Steps: 3 Home Layout: One level Home Equipment: None      Prior Function Level of Independence: Independent         Comments: pt drives, euns errands, etc     Hand Dominance        Extremity/Trunk Assessment   Upper Extremity Assessment Upper Extremity Assessment: Overall WFL for tasks assessed    Lower Extremity Assessment Lower  Extremity Assessment: Overall WFL for tasks assessed (h/o R TKA, will eventually need L )       Communication   Communication: No difficulties  Cognition Arousal/Alertness: Awake/alert Behavior During Therapy: WFL for tasks assessed/performed Overall Cognitive Status: Within Functional Limits for tasks assessed                                        General Comments      Exercises     Assessment/Plan    PT Assessment Patent does not need any further PT services  PT Problem List         PT Treatment Interventions      PT Goals (Current goals can be found in the Care Plan section)  Acute  Rehab PT Goals Patient Stated Goal: go home today PT Goal Formulation: All assessment and education complete, DC therapy    Frequency     Barriers to discharge        Co-evaluation               AM-PAC PT "6 Clicks" Mobility  Outcome Measure Help needed turning from your back to your side while in a flat bed without using bedrails?: None Help needed moving from lying on your back to sitting on the side of a flat bed without using bedrails?: None Help needed moving to and from a bed to a chair (including a wheelchair)?: None Help needed standing up from a chair using your arms (e.g., wheelchair or bedside chair)?: None Help needed to walk in hospital room?: None Help needed climbing 3-5 steps with a railing? : None 6 Click Score: 24    End of Session Equipment Utilized During Treatment: Gait belt Activity Tolerance: Patient tolerated treatment well Patient left: in chair;with call bell/phone within reach Nurse Communication: Mobility status PT Visit Diagnosis: Muscle weakness (generalized) (M62.81);Difficulty in walking, not elsewhere classified (R26.2)    Time: 0902-0920 PT Time Calculation (min) (ACUTE ONLY): 18 min   Charges:              Malachi Pro, DPT 09/20/2020, 10:22 AM

## 2020-09-20 NOTE — Progress Notes (Addendum)
SLP Cancellation Note  Patient Details Name: Crystal Haas MRN: [id number] DOB: Feb 25, 1946   Cancelled treatment:       Reason Eval/Treat Not Completed: SLP screened, no needs identified, will sign off.  Pt was seen in room; sitting in chair upon clinician arrival. Per cursory case history interview w/ pt, pt appears at her baseline for all cognitive-linguistic functions. Pt was able to send texts and use phone w/ clinician in room. Pt verbally endorsed no noted deviation from baseline during conversation, texting on her smartphone, or making phone calls. Based on informal conversation w/ pt, no overt deficits in pt's Speech, Language, or Communication noted. Breakfast tray was in room & partially eaten-- no swallowing difficulties reported by pt.    Oliver Pila  Graduate Clinician 09/20/2020, 9:54 AM     The information in this patient note, response to treatment, and overall treatment plan developed has been reviewed and agreed upon after reviewing documentation. This session was performed under the supervision of this licensed clinician.   Jerilynn Som, MS, CCC-SLP Speech Language Pathologist Rehab Services [phone]

## 2020-09-21 ENCOUNTER — Telehealth: Payer: Self-pay | Admitting: Family Medicine

## 2020-09-21 ENCOUNTER — Other Ambulatory Visit: Payer: Self-pay

## 2020-09-21 ENCOUNTER — Ambulatory Visit: Payer: Medicare HMO | Admitting: Cardiovascular Disease

## 2020-09-21 ENCOUNTER — Encounter: Payer: Self-pay | Admitting: Cardiovascular Disease

## 2020-09-21 VITALS — BP 130/80 | HR 72 | Ht 65.0 in | Wt 225.0 lb

## 2020-09-21 DIAGNOSIS — I482 Chronic atrial fibrillation, unspecified: Secondary | ICD-10-CM | POA: Diagnosis not present

## 2020-09-21 DIAGNOSIS — E785 Hyperlipidemia, unspecified: Secondary | ICD-10-CM | POA: Diagnosis not present

## 2020-09-21 DIAGNOSIS — I10 Essential (primary) hypertension: Secondary | ICD-10-CM | POA: Diagnosis not present

## 2020-09-21 NOTE — Telephone Encounter (Signed)
Patient called in for Hospital follow up on schedule for 12-14 @11 :30

## 2020-09-21 NOTE — Patient Instructions (Signed)
Medication Instructions:  No new medication or changes  *If you need a refill on your cardiac medications before your next appointment, please call your pharmacy*   Lab Work: None If you have labs (blood work) drawn today and your tests are completely normal, you will receive your results only by:  MyChart Message (if you have MyChart) OR  A paper copy in the mail If you have any lab test that is abnormal or we need to change your treatment, we will call you to review the results.   Testing/Procedures: None   Follow-Up: At [facility], you and your health needs are our priority.  As part of our continuing mission to provide you with exceptional heart care, we have created designated Provider Care Teams.  These Care Teams include your primary Cardiologist (physician) and Advanced Practice Providers (APPs -  Physician Assistants and Nurse Practitioners) who all work together to provide you with the care you need, when you need it.  We recommend signing up for the patient portal called "MyChart".  Sign up information is provided on this After Visit Summary.  MyChart is used to connect with patients for Virtual Visits (Telemedicine).  Patients are able to view lab/test results, encounter notes, upcoming appointments, etc.  Non-urgent messages can be sent to your provider as well.   To learn more about what you can do with MyChart, go to [email or web address].    Your next appointment:   4 month(s)  The format for your next appointment:   In Person  Provider:   You may see Lorine Bears, MD or one of the following Advanced Practice Providers on your designated Care Team:    Nicolasa Ducking, NP  Eula Listen, PA-C  Marisue Ivan, PA-C  Cadence [place], [place]  Gillian Shields, NP    Other Instructions N/A

## 2020-09-21 NOTE — Telephone Encounter (Signed)
Noted. Will follow.  

## 2020-09-21 NOTE — Progress Notes (Signed)
Cardiology Office Note   Date:  09/21/2020   ID:  Crystal Haas, DOB February 15, 1946, MRN [id number]  PCP:  Glori Luis, MD  Cardiologist:   Lorine Bears, MD   Chief Complaint  Patient presents with  . office visit    Hospital F/U after TIA; Meds verbally reviewed with patient.      History of Present Illness: Mandeep Ferch is a 74 y.o. female who presents for a follow-up visit regarding chronic atrial fibrillation and hypertension.  Echocardiogram in July, 2016 showed normal LV systolic function with mildly dilated right and left atrium.  She is tolerating anticoagulation with Eliquis.   She has known history of sleep apnea on CPAP. She had previous knee surgery. Losartan was added during last visit due to elevated blood pressure. She was hospitalized in January with lower GI bleed.  Colonoscopy showed multiple diverticula with no active bleeding.  Eliquis was interrupted for few days and resumed with no issues.    She was seen in July for atypical chest pain.  Lexiscan Myoview showed no evidence of ischemia with normal ejection fraction.  She was seen recently in the ED for gastroenteritis.  She was briefly hospitalized recently at [facility] with numbness and weakness of the right arm and leg.  Symptoms lasted for about 20 minutes.  She also had slurred speech.  She was hospitalized.  MRI showed small infarcts in the left insula and overlying frontal operculum with possible infarct in the left corona radiate.  She was seen by neurology who recommended increasing the dose of atorvastatin.  Echocardiogram showed normal LV systolic function with no obvious source of cardiac embolism.  Carotid Doppler showed no significant disease.  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  She did miss 1 dose of Eliquis before her stroke.  She did not take the dose because she had a puncture wound to her left hand from working in the kitchen with a  knife.  She feels back to baseline and denies chest pain, shortness of breath or palpitations.  Past Medical History:  Diagnosis Date  . Anemia    distant past  . Anxiety   . Arthritis    "everywhere" - big toes worst  . Chronic atrial fibrillation (HCC)    a. on eliquis; b. CHADS2VASc at least 2 (age x 1, female)  . Depression   . GERD (gastroesophageal reflux disease)    RARE  . Heart murmur    mild - followed by PCP  . Knee pain   . Motion sickness    back seat of car  . OSA on CPAP    CPAP-4 PSI  . 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 Seasonal allergies    takes allergy weekly  . Status post bilateral breast reduction 09/06/2016   Overview:  08/29/16    Past Surgical History:  Procedure Laterality Date  . BREAST REDUCTION SURGERY Bilateral 08/29/2016   Procedure: BILATERAL MAMMARY REDUCTION  (BREAST)WITH LIPOSUCTION;  Surgeon: Peggye Form, DO;  Location: [facility];  Service: Plastics;  Laterality: Bilateral;  . CATARACT EXTRACTION W/PHACO Left 05/03/2015   Procedure: CATARACT EXTRACTION PHACO AND INTRAOCULAR LENS PLACEMENT (IOC);  Surgeon: Lockie Mola, MD;  Location: [facility];  Service: Ophthalmology;  Laterality: Left;  CPAP  . CATARACT EXTRACTION W/PHACO Right 10/09/2016   Procedure: CATARACT EXTRACTION PHACO AND INTRAOCULAR LENS PLACEMENT (IOC);  Surgeon: Lockie Mola, MD;  Location: [facility];  Service: Ophthalmology;  Laterality: Right;  sleep apnea  . CHONDROPLASTY Right 04/29/2016   Procedure: CHONDROPLASTY;  Surgeon: Donato Heinz, MD;  Location: [facility];  Service: Orthopedics;  Laterality: Right;  . COLONOSCOPY WITH PROPOFOL N/A 10/24/2018   Procedure: COLONOSCOPY WITH PROPOFOL;  Surgeon: Pasty Spillers, MD;  Location: [facility];  Service: Endoscopy;  Laterality: N/A;  . EYE SURGERY    . KNEE ARTHROPLASTY Right 03/25/2018   Procedure: COMPUTER ASSISTED TOTAL KNEE ARTHROPLASTY;  Surgeon:  Donato Heinz, MD;  Location: [facility];  Service: Orthopedics;  Laterality: Right;  . KNEE ARTHROSCOPY WITH LATERAL MENISECTOMY  04/29/2016   Procedure: KNEE ARTHROSCOPY WITH LATERAL MENISECTOMY;  Surgeon: Donato Heinz, MD;  Location: [facility];  Service: Orthopedics;;  . KNEE ARTHROSCOPY WITH MEDIAL MENISECTOMY  04/29/2016   Procedure: KNEE ARTHROSCOPY WITH MEDIAL MENISECTOMY;  Surgeon: Donato Heinz, MD;  Location: [facility];  Service: Orthopedics;;  . REDUCTION MAMMAPLASTY Bilateral 09/2016  . RETINAL DETACHMENT SURGERY Left May 03, 2015   Dr. Inez Pilgrim, [facility]  . TUBAL LIGATION       Current Outpatient Medications  Medication Sig Dispense Refill  . apixaban (ELIQUIS) 5 MG TABS tablet Take 1 tablet (5 mg total) by mouth 2 (two) times daily. 60 tablet 11  . atorvastatin (LIPITOR) 80 MG tablet Take 1 tablet (80 mg total) by mouth at bedtime. 90 tablet 0  . azelastine (OPTIVAR) 0.05 % ophthalmic solution Place 1 drop into both eyes 2 (two) times daily.    . B Complex-C (B-COMPLEX WITH VITAMIN C) tablet Take 1 tablet by mouth daily.    . Black Cohosh 40 MG CAPS Take 40 mg by mouth daily.    . cetirizine (ZYRTEC) 10 MG tablet Take 1 tablet (10 mg total) by mouth daily. 90 tablet 0  . EPIPEN 2-PAK 0.3 MG/0.3ML SOAJ injection Inject 0.3 mLs (0.3 mg total) into the muscle as directed. AS NEEDED FOR ANAPHYLAXIS 2 Device 0  . fluticasone (FLONASE) 50 MCG/ACT nasal spray Place 2 sprays into both nostrils daily. 16 g 0  . GLUCOSAMINE-CHONDROITIN DS PO Take 3,000 mg by mouth daily.    Marland Kitchen losartan (COZAAR) 25 MG tablet Take 1 tablet (25 mg total) by mouth daily. 90 tablet 3  . Magnesium 250 MG TABS Take 250 mg by mouth daily.    . montelukast (SINGULAIR) 10 MG tablet Take 10 mg by mouth at bedtime.     . Multiple Vitamin (MULTIVITAMIN WITH MINERALS) TABS tablet Take 1 tablet by mouth daily. One-A-Day Active 65+    . sertraline (ZOLOFT) 50 MG tablet Take 1 tablet (50 mg total) by mouth daily. 90  tablet 1   No current facility-administered medications for this visit.    Allergies:   Ivp dye [iodinated diagnostic agents], Iodine, and Tape    Social History:  The patient  reports that she quit smoking about 50 years ago. Her smoking use included cigarettes. She has a 0.50 pack-year smoking history. She has never used smokeless tobacco. She reports previous alcohol use. She reports that she does not use drugs.      ROS:  Please see the history of present illness.   Otherwise, review of systems are positive for none.   All other systems are reviewed and negative.    PHYSICAL EXAM: VS:  BP 130/80 (BP Location: Left Arm, Patient Position: Sitting, Cuff Size: Large)   Pulse 72   Ht 5\' 5"  (1.651 m)   Wt 225  lb (102.1 kg)   SpO2 95%   BMI 37.44 kg/m  , BMI Body mass index is 37.44 kg/m. GEN: Well nourished, well developed, in no acute distress  HEENT: normal  Neck: no JVD, carotid bruits, or masses Cardiac: Irregularly irregular; no murmurs, rubs, or gallops,no edema  Respiratory:  clear to auscultation bilaterally, normal work of breathing GI: soft, nontender, nondistended, + BS MS: no deformity or atrophy  Skin: warm and dry, no rash Neuro:  Strength and sensation are intact Psych: euthymic mood, full affect   EKG:  EKG  ordered today. EKG showed atrial fibrillation with ventricular rate of 72 bpm.  Recent Labs: 09/19/2020: ALT 25; BUN 8; Creatinine, Ser 0.97; Hemoglobin 14.2; Platelets 196; Potassium 4.4; Sodium 139    Lipid Panel    Component Value Date/Time   CHOL 94 09/20/2020 0632   CHOL 186 09/04/2015 1051   TRIG 90 09/20/2020 0632   HDL 43 09/20/2020 0632   HDL 66 09/04/2015 1051   CHOLHDL 2.2 09/20/2020 0632   VLDL 18 09/20/2020 0632   LDLCALC 33 09/20/2020 0632   LDLCALC 99 09/04/2015 1051   LDLDIRECT 60.0 09/30/2016 1129      Wt Readings from Last 3 Encounters:  09/21/20 225 lb (102.1 kg)  09/19/20 232 lb (105.2 kg)  09/09/20 232 lb (105.2 kg)         ASSESSMENT AND PLAN:  1.  Chronic atrial fibrillation: Ventricular rate is controlled without any medication.  She is tolerating anticoagulation with Eliquis.  Recent labs were unremarkable  2.  Essential hypertension: Blood pressure is reasonably controlled.  3.  Recent left MCA stroke: She did miss 1 dose of Eliquis before her presentation but not entirely clear if just missing 1 dose is sufficient to cause the event.  In situ thrombosis rather than embolism was suggested by neurology.  If she develops recurrent strokes in spite of taking Eliquis regularly, we have to think about alternative agents or considering a left atrial appendage occluding device in addition to anticoagulation. Patient does live by herself and I asked her to consider investing in technology to alert EMS in case she falls or not able to family use her phone.  4.  Hyperlipidemia: The dose of atorvastatin was recently increased to 80 mg due to recent stroke.  Her LDL is usually below 50.   Disposition:   FU with me in 4 months.  Signed,  Lorine Bears, MD  09/21/2020 9:47 AM    [facility]

## 2020-09-22 ENCOUNTER — Telehealth: Payer: Self-pay

## 2020-09-22 NOTE — Telephone Encounter (Signed)
Reviewed

## 2020-09-22 NOTE — Telephone Encounter (Signed)
Transition Care Management Follow-up Telephone Call  Date of discharge and from where: 09/20/20 from [facility].  How have you been since you were released from the hospital? Speech was slightly slurred but now I am doing fine and having no signs or symptoms. Denies on going slurred speech, droop face, numbness, blurred vision and all other symptoms. Pacing myself with activity. Seen by Cardiology yesterday.  Any questions or concerns? No  Items Reviewed:  Did the pt receive and understand the discharge instructions provided? Yes   Medications obtained and verified? Yes   Other? No  Any new allergies since your discharge? No   Dietary orders reviewed? Low sodium/heart healthy  Do you have support at home? Yes   Home Care and Equipment/Supplies: Were home health services ordered? No Were any new equipment or medical supplies ordered?  No  Functional Questionnaire: (I = Independent and D = Dependent) ADLs: i  Bathing/Dressing- i  Meal Prep- i  Eating- i  Maintaining continence- i  Transferring/Ambulation- i  Managing Meds- i  Follow up appointments reviewed:   PCP Hospital f/u appt confirmed? Yes  Scheduled to see Dr. Birdie Sons on 10/03/20 @ 11:30.  Specialist Hospital f/u appt confirmed? Currently staying in [place] [facility] with family. Plans to schedule Neurology follow up in that area.   Are transportation arrangements needed? No   If their condition worsens, is the pt aware to call PCP or go to the Emergency Dept.? Yes  Was the patient provided with contact information for the PCP's office or ED? Yes  Was to pt encouraged to call back with questions or concerns? Yes

## 2020-09-29 DIAGNOSIS — J301 Allergic rhinitis due to pollen: Secondary | ICD-10-CM | POA: Diagnosis not present

## 2020-10-03 ENCOUNTER — Other Ambulatory Visit: Payer: Self-pay

## 2020-10-03 ENCOUNTER — Telehealth (INDEPENDENT_AMBULATORY_CARE_PROVIDER_SITE_OTHER): Payer: Medicare HMO | Admitting: Family Medicine

## 2020-10-03 ENCOUNTER — Encounter: Payer: Self-pay | Admitting: Family Medicine

## 2020-10-03 DIAGNOSIS — Z8673 Personal history of transient ischemic attack (TIA), and cerebral infarction without residual deficits: Secondary | ICD-10-CM

## 2020-10-03 DIAGNOSIS — I10 Essential (primary) hypertension: Secondary | ICD-10-CM

## 2020-10-03 DIAGNOSIS — E785 Hyperlipidemia, unspecified: Secondary | ICD-10-CM

## 2020-10-03 DIAGNOSIS — I639 Cerebral infarction, unspecified: Secondary | ICD-10-CM

## 2020-10-03 NOTE — Assessment & Plan Note (Signed)
Well controlled on lipitor. She will continue with the newly increased dose of 80 mg daily and we will recheck labs in 4 weeks.

## 2020-10-03 NOTE — Assessment & Plan Note (Signed)
Adequately controlled. Continue losartan.

## 2020-10-03 NOTE — Progress Notes (Signed)
Virtual Visit via telephone Note  This visit type was conducted due to national recommendations for restrictions regarding the COVID-19 pandemic (e.g. social distancing).  This format is felt to be most appropriate for this patient at this time.  All issues noted in this document were discussed and addressed.  No physical exam was performed (except for noted visual exam findings with Video Visits).   I connected with Crystal Haas today at 11:30 AM EST by telephone and verified that I am speaking with the correct person using two identifiers. Location patient: home Location provider: home office Persons participating in the virtual visit: patient, provider  I discussed the limitations, risks, security and privacy concerns of performing an evaluation and management service by telephone and the availability of in person appointments. I also discussed with the patient that there may be a patient responsible charge related to this service. The patient expressed understanding and agreed to proceed.  Interactive audio and video telecommunications were attempted between this provider and patient, however failed, due to patient having technical difficulties OR patient did not have access to video capability.  We continued and completed visit with audio only.   Reason for visit: hospital follow-up  HPI: CVA: patient notes acute onset right arm and leg weakness that was noticed on waking up. Resolved on its own after 20 minutes. She called cardiology the next morning and was slurring her speech so they advised ED evaluation. She was found to have infarcts on MRI and had an occluded M2 left MCA. Neurology was consulted and recommended increasing her lipitor even with a well controlled LDL and noted that her eliquis was missed for one dose 2 days prior to the event and that could have contributed. She notes she has done well since discharge. She has no numbness or weakness. She had tiredness after getting home  though that has progressively improved. Now she is back to her baseline. She did follow-up with cardiology who noted it was not likely that one missed dose of eliquis would have contributed. A1c well controlled. LDL well controlled. BP well controlled.    ROS: See pertinent positives and negatives per HPI.  Past Medical History:  Diagnosis Date  . Anemia    distant past  . Anxiety   . Arthritis    "everywhere" - big toes worst  . Chronic atrial fibrillation (HCC)    a. on eliquis; b. CHADS2VASc at least 2 (age x 1, female)  . Depression   . GERD (gastroesophageal reflux disease)    RARE  . Heart murmur    mild - followed by PCP  . Knee pain   . Motion sickness    back seat of car  . OSA on CPAP    CPAP-4 PSI  . 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 Seasonal allergies    takes allergy weekly  . Status post bilateral breast reduction 09/06/2016   Overview:  08/29/16    Past Surgical History:  Procedure Laterality Date  . BREAST REDUCTION SURGERY Bilateral 08/29/2016   Procedure: BILATERAL MAMMARY REDUCTION  (BREAST)WITH LIPOSUCTION;  Surgeon: Peggye Form, DO;  Location:  SURGERY CENTER;  Service: Plastics;  Laterality: Bilateral;  . CATARACT EXTRACTION W/PHACO Left 05/03/2015   Procedure: CATARACT EXTRACTION PHACO AND INTRAOCULAR LENS PLACEMENT (IOC);  Surgeon: Lockie Mola, MD;  Location: [facility];  Service: Ophthalmology;  Laterality: Left;  CPAP  . CATARACT EXTRACTION W/PHACO Right 10/09/2016   Procedure: CATARACT EXTRACTION PHACO AND INTRAOCULAR LENS PLACEMENT (  IOC);  Surgeon: Lockie Mola, MD;  Location: [facility];  Service: Ophthalmology;  Laterality: Right;  sleep apnea  . CHONDROPLASTY Right 04/29/2016   Procedure: CHONDROPLASTY;  Surgeon: Donato Heinz, MD;  Location: [facility] ORS;  Service: Orthopedics;  Laterality: Right;  . COLONOSCOPY WITH PROPOFOL N/A 10/24/2018   Procedure: COLONOSCOPY WITH PROPOFOL;  Surgeon:  Pasty Spillers, MD;  Location: [facility] END[facility];  Service: Endoscopy;  Laterality: N/A;  . EYE SURGERY    . KNEE ARTHROPLASTY Right 03/25/2018   Procedure: COMPUTER ASSISTED TOTAL KNEE ARTHROPLASTY;  Surgeon: Donato Heinz, MD;  Location: [facility] OR[facility];  Service: Orthopedics;  Laterality: Right;  . KNEE ARTHROSCOPY WITH LATERAL MENISECTOMY  04/29/2016   Procedure: KNEE ARTHROSCOPY WITH LATERAL MENISECTOMY;  Surgeon: Donato Heinz, MD;  Location: [facility];  Service: Orthopedics;;  . KNEE ARTHROSCOPY WITH MEDIAL MENISECTOMY  04/29/2016   Procedure: KNEE ARTHROSCOPY WITH MEDIAL MENISECTOMY;  Surgeon: Donato Heinz, MD;  Location: [facility] OR[facility];  Service: Orthopedics;;  . REDUCTION MAMMAPLASTY Bilateral 09/2016  . RETINAL DETACHMENT SURGERY Left May 03, 2015   Dr. Inez Pilgrim, [facility]  . TUBAL LIGATION      Family History  Problem Relation Age of Onset  . Alcoholism Other   . Heart disease Other   . Breast cancer Neg Hx     SOCIAL HX: former smoker   Current Outpatient Medications:  .  apixaban (ELIQUIS) 5 MG TABS tablet, Take 1 tablet (5 mg total) by mouth 2 (two) times daily., Disp: 60 tablet, Rfl: 11 .  atorvastatin (LIPITOR) 80 MG tablet, Take 1 tablet (80 mg total) by mouth at bedtime., Disp: 90 tablet, Rfl: 0 .  azelastine (OPTIVAR) 0.05 % ophthalmic solution, Place 1 drop into both eyes 2 (two) times daily., Disp: , Rfl:  .  B Complex-C (B-COMPLEX WITH VITAMIN C) tablet, Take 1 tablet by mouth daily., Disp: , Rfl:  .  Black Cohosh 40 MG CAPS, Take 40 mg by mouth daily., Disp: , Rfl:  .  cetirizine (ZYRTEC) 10 MG tablet, Take 1 tablet (10 mg total) by mouth daily., Disp: 90 tablet, Rfl: 0 .  EPIPEN 2-PAK 0.3 MG/0.3ML SOAJ injection, Inject 0.3 mLs (0.3 mg total) into the muscle as directed. AS NEEDED FOR ANAPHYLAXIS, Disp: 2 Device, Rfl: 0 .  fluticasone (FLONASE) 50 MCG/ACT nasal spray, Place 2 sprays into both nostrils daily., Disp: 16 g, Rfl: 0 .  GLUCOSAMINE-CHONDROITIN DS PO, Take  3,000 mg by mouth daily., Disp: , Rfl:  .  losartan (COZAAR) 25 MG tablet, Take 1 tablet (25 mg total) by mouth daily., Disp: 90 tablet, Rfl: 3 .  Magnesium 250 MG TABS, Take 250 mg by mouth daily., Disp: , Rfl:  .  montelukast (SINGULAIR) 10 MG tablet, Take 10 mg by mouth at bedtime. , Disp: , Rfl:  .  Multiple Vitamin (MULTIVITAMIN WITH MINERALS) TABS tablet, Take 1 tablet by mouth daily. One-A-Day Active 65+, Disp: , Rfl:  .  sertraline (ZOLOFT) 50 MG tablet, Take 1 tablet (50 mg total) by mouth daily., Disp: 90 tablet, Rfl: 1  EXAM: This was a telephone visit and thus no exam was completed.   ASSESSMENT AND PLAN:  Discussed the following assessment and plan:  Problem List Items Addressed This Visit    CVA (cerebral vascular accident) [facility])    Patient has recovered well from this. She is back to her baseline. She has an appointment with neurology at [facility] [place] in late January and she will keep this. I  think it is less likely that one missed dose of eliquis contributed to her stroke though it is a possibility. Agree with Dr Kirke Corin that if she has another stroke on eliquis we would need to consider other options for treatment of her afib. I also encouraged her to consider lifealert or an apple watch as a way to alert emergency services if she has another issue like this as she lives alone. We will continue risk factor management.       HLD (hyperlipidemia)    Well controlled on lipitor. She will continue with the newly increased dose of 80 mg daily and we will recheck labs in 4 weeks.       Relevant Orders   LDL cholesterol, direct   Hepatic function panel   Hypertension    Adequately controlled. Continue losartan.          I discussed the assessment and treatment plan with the patient. The patient was provided an opportunity to ask questions and all were answered. The patient agreed with the plan and demonstrated an understanding of the instructions.   The patient was advised to call  back or seek an in-person evaluation if the symptoms worsen or if the condition fails to improve as anticipated.  I provided 14 minutes of non-face-to-face time during this encounter.   Marikay Alar, MD

## 2020-10-03 NOTE — Assessment & Plan Note (Signed)
Patient has recovered well from this. She is back to her baseline. She has an appointment with neurology at [facility] in late January and she will keep this. I think it is less likely that one missed dose of eliquis contributed to her stroke though it is a possibility. Agree with Dr Kirke Corin that if she has another stroke on eliquis we would need to consider other options for treatment of her afib. I also encouraged her to consider lifealert or an apple watch as a way to alert emergency services if she has another issue like this as she lives alone. We will continue risk factor management.

## 2020-10-05 DIAGNOSIS — I10 Essential (primary) hypertension: Secondary | ICD-10-CM

## 2020-10-06 ENCOUNTER — Other Ambulatory Visit: Payer: Self-pay | Admitting: Cardiovascular Disease

## 2020-10-06 ENCOUNTER — Encounter: Payer: Self-pay | Admitting: Family Medicine

## 2020-10-06 MED ORDER — SERTRALINE HCL 50 MG PO TABS
50.0000 mg | ORAL_TABLET | Freq: Every day | ORAL | 1 refills | Status: DC
Start: 2020-10-06 — End: 2020-10-06

## 2020-10-06 MED ORDER — SERTRALINE HCL 50 MG PO TABS
50.0000 mg | ORAL_TABLET | Freq: Every day | ORAL | 1 refills | Status: DC
Start: 2020-10-06 — End: 2020-10-10

## 2020-10-10 DIAGNOSIS — J301 Allergic rhinitis due to pollen: Secondary | ICD-10-CM | POA: Diagnosis not present

## 2020-10-10 MED ORDER — SERTRALINE HCL 50 MG PO TABS
50.0000 mg | ORAL_TABLET | Freq: Every day | ORAL | 0 refills | Status: DC
Start: 2020-10-10 — End: 2021-03-21

## 2020-10-10 NOTE — Addendum Note (Signed)
Addended by: Glori Luis on: 10/10/2020 03:52 PM   Modules accepted: Orders

## 2020-10-11 MED ORDER — LOSARTAN POTASSIUM 50 MG PO TABS: 50.0000 mg | ORAL_TABLET | Freq: Every day | ORAL | 5 refills | Status: DC

## 2020-10-11 NOTE — Telephone Encounter (Signed)
Patient calling to check on status  States she needs losartan refilled with increased dosage She is taking 25 MG 4 times daily  States she need this refilled today because she will be going out of town for a few weeks tomorrow Please [facility] to [facility] [place]

## 2020-10-12 ENCOUNTER — Ambulatory Visit: Payer: Medicare HMO

## 2020-10-17 DIAGNOSIS — J301 Allergic rhinitis due to pollen: Secondary | ICD-10-CM | POA: Diagnosis not present

## 2020-10-24 DIAGNOSIS — J301 Allergic rhinitis due to pollen: Secondary | ICD-10-CM | POA: Diagnosis not present

## 2020-10-26 DIAGNOSIS — G4733 Obstructive sleep apnea (adult) (pediatric): Secondary | ICD-10-CM | POA: Diagnosis not present

## 2020-10-31 ENCOUNTER — Other Ambulatory Visit: Payer: Self-pay

## 2020-10-31 ENCOUNTER — Other Ambulatory Visit (INDEPENDENT_AMBULATORY_CARE_PROVIDER_SITE_OTHER): Payer: Medicare HMO

## 2020-10-31 DIAGNOSIS — E785 Hyperlipidemia, unspecified: Secondary | ICD-10-CM

## 2020-10-31 LAB — HEPATIC FUNCTION PANEL
ALT: 16 U/L (ref 0–35)
AST: 16 U/L (ref 0–37)
Albumin: 4.4 g/dL (ref 3.5–5.2)
Alkaline Phosphatase: 62 U/L (ref 39–117)
Bilirubin, Direct: 0.2 mg/dL (ref 0.0–0.3)
Total Bilirubin: 0.7 mg/dL (ref 0.2–1.2)
Total Protein: 7 g/dL (ref 6.0–8.3)

## 2020-10-31 LAB — LDL CHOLESTEROL, DIRECT: Direct LDL: 57 mg/dL

## 2020-11-08 DIAGNOSIS — J301 Allergic rhinitis due to pollen: Secondary | ICD-10-CM | POA: Diagnosis not present

## 2020-11-14 DIAGNOSIS — Z7901 Long term (current) use of anticoagulants: Secondary | ICD-10-CM | POA: Insufficient documentation

## 2020-11-14 DIAGNOSIS — E785 Hyperlipidemia, unspecified: Secondary | ICD-10-CM | POA: Diagnosis not present

## 2020-11-14 DIAGNOSIS — I10 Essential (primary) hypertension: Secondary | ICD-10-CM | POA: Diagnosis not present

## 2020-11-14 DIAGNOSIS — I482 Chronic atrial fibrillation, unspecified: Secondary | ICD-10-CM | POA: Diagnosis not present

## 2020-11-14 DIAGNOSIS — Z8673 Personal history of transient ischemic attack (TIA), and cerebral infarction without residual deficits: Secondary | ICD-10-CM | POA: Diagnosis not present

## 2020-11-14 DIAGNOSIS — J301 Allergic rhinitis due to pollen: Secondary | ICD-10-CM | POA: Diagnosis not present

## 2020-11-14 DIAGNOSIS — G4733 Obstructive sleep apnea (adult) (pediatric): Secondary | ICD-10-CM | POA: Diagnosis not present

## 2020-11-14 DIAGNOSIS — R7303 Prediabetes: Secondary | ICD-10-CM | POA: Diagnosis not present

## 2020-11-14 DIAGNOSIS — Z9989 Dependence on other enabling machines and devices: Secondary | ICD-10-CM | POA: Diagnosis not present

## 2020-11-21 DIAGNOSIS — J301 Allergic rhinitis due to pollen: Secondary | ICD-10-CM | POA: Diagnosis not present

## 2020-11-22 ENCOUNTER — Telehealth: Payer: Self-pay | Admitting: Cardiovascular Disease

## 2020-11-22 ENCOUNTER — Encounter: Payer: Self-pay | Admitting: Family Medicine

## 2020-11-22 DIAGNOSIS — I482 Chronic atrial fibrillation, unspecified: Secondary | ICD-10-CM

## 2020-11-22 DIAGNOSIS — Z7901 Long term (current) use of anticoagulants: Secondary | ICD-10-CM

## 2020-11-22 NOTE — Telephone Encounter (Signed)
OPENED IN ERROR

## 2020-11-22 NOTE — Telephone Encounter (Addendum)
°*  STAT* If patient is at the pharmacy, call can be transferred to refill team.   1. Which medications need to be refilled? (please list name of each medication and dose if known)  Eliquis 5 mg po BID  2. Which pharmacy/location (including street and city if local pharmacy) is medication to be sent to?    [facility] [place]   3. Do they need a 30 day or 90 day supply? 30

## 2020-11-23 MED ORDER — APIXABAN 5 MG PO TABS: 5.0000 mg | ORAL_TABLET | Freq: Two times a day (BID) | ORAL | 1 refills | Status: DC

## 2020-11-23 NOTE — Telephone Encounter (Signed)
10f, 102.1kg, scr 0.97 (09/19/20) lovw/arida (09/21/20), refill request received for eliquis 5mg  and the pt qualifies so refill will be granted

## 2020-11-23 NOTE — Telephone Encounter (Signed)
Refill request

## 2020-11-28 ENCOUNTER — Telehealth: Payer: Self-pay

## 2020-11-28 ENCOUNTER — Ambulatory Visit: Payer: Medicare HMO

## 2020-11-28 ENCOUNTER — Telehealth: Payer: Self-pay | Admitting: Cardiovascular Disease

## 2020-11-28 DIAGNOSIS — J301 Allergic rhinitis due to pollen: Secondary | ICD-10-CM | POA: Diagnosis not present

## 2020-11-28 NOTE — Telephone Encounter (Signed)
Patient assistance application for eliquis placed on Dr. Jari Sportsman desk to be signed.

## 2020-11-28 NOTE — Telephone Encounter (Signed)
Unsuccessful attempt to reach patient for scheduled awv. No answer. Left voicemail to call office and reschedule.

## 2020-11-28 NOTE — Telephone Encounter (Signed)
Patient came by office Dropped off assistance forms to be completed  Placed in nurse box  

## 2020-12-01 NOTE — Telephone Encounter (Signed)
Patient assistance application for Eliquis faxed to [facility]. Fax confirmation received.

## 2020-12-06 ENCOUNTER — Telehealth: Payer: Self-pay | Admitting: Family Medicine

## 2020-12-06 NOTE — Telephone Encounter (Signed)
Pt is having a deep dental cleaning tomorrow 12/07/20 at 10am and would like to know if she needs to take any of her medicines?  Please advise

## 2020-12-06 NOTE — Telephone Encounter (Signed)
Her dentist may prefer if she were off her eliquis for her cleaning, though stopping this at this time with her cleaning being less than a day away would not make a difference as it would not be long enough since her last dose. It also would not be recommended to hold her eliquis at this time as she had a stroke less than 3 months ago. In the future we need to know about any type of procedure or dental procedure she is having sooner than the day before the visit.

## 2020-12-07 NOTE — Telephone Encounter (Signed)
I called the patient and informed her that she called to late to do anything about her medications and the patient stated her cleaning was put off until June., I inforemed her that she needed to call us at least 1 week before the procedure so we can tell her how to take her medications and she agreed.  Nina,cma

## 2020-12-11 ENCOUNTER — Ambulatory Visit (INDEPENDENT_AMBULATORY_CARE_PROVIDER_SITE_OTHER): Payer: Medicare HMO

## 2020-12-11 VITALS — Ht 65.0 in | Wt 225.0 lb

## 2020-12-11 DIAGNOSIS — Z0000 Encounter for general adult medical examination without abnormal findings: Secondary | ICD-10-CM

## 2020-12-11 NOTE — Telephone Encounter (Signed)
Response fax received from [facility] patient assistance for Eliquis Patient is not currently eligible: Documentation of 3% out of pocket prescription expenses, based on household adjusted income, not meet.  A letter regarding the decision has been sent to the patient.

## 2020-12-11 NOTE — Progress Notes (Signed)
Subjective:   Crystal Haas is a 75 y.o. female who presents for Medicare Annual (Subsequent) preventive examination.  Review of Systems    No ROS.  Medicare Wellness Virtual Visit.   Cardiac Risk Factors include: advanced age (>1men, >72 women);hypertension     Objective:    Today's Vitals   12/11/20 1408  Weight: 225 lb (102.1 kg)  Height: 5\' 5"  (1.651 m)   Body mass index is 37.44 kg/m.  Advanced Directives 12/11/2020 09/19/2020 09/19/2020 09/09/2020 04/28/2020 10/12/2019 10/23/2018  Does Patient Have a Medical Advance Directive? Yes No No No Yes Yes No  Type of 12/22/2018 of [place];Living will - - - Living will Healthcare Power of Attorney -  Does patient want to make changes to medical advance directive? No - Patient declined - - - - No - Patient declined -  Copy of Healthcare Power of Attorney in Chart? Yes - validated most recent copy scanned in chart (See row information) - - - - No - copy requested -  Would patient like information on creating a medical advance directive? - No - Patient declined - - - - No - Patient declined    Current Medications (verified) Outpatient Encounter Medications as of 12/11/2020  Medication Sig  . apixaban (ELIQUIS) 5 MG TABS tablet Take 1 tablet (5 mg total) by mouth 2 (two) times daily.  12/13/2020 atorvastatin (LIPITOR) 80 MG tablet Take 1 tablet (80 mg total) by mouth at bedtime.  Marland Kitchen azelastine (OPTIVAR) 0.05 % ophthalmic solution Place 1 drop into both eyes 2 (two) times daily.  . B Complex-C (B-COMPLEX WITH VITAMIN C) tablet Take 1 tablet by mouth daily.  . Black Cohosh 40 MG CAPS Take 40 mg by mouth daily.  . cetirizine (ZYRTEC) 10 MG tablet Take 1 tablet (10 mg total) by mouth daily.  Marland Kitchen EPIPEN 2-PAK 0.3 MG/0.3ML SOAJ injection Inject 0.3 mLs (0.3 mg total) into the muscle as directed. AS NEEDED FOR ANAPHYLAXIS  . fluticasone (FLONASE) 50 MCG/ACT nasal spray Place 2 sprays into both nostrils daily.  Marland Kitchen  GLUCOSAMINE-CHONDROITIN DS PO Take 3,000 mg by mouth daily.  Marland Kitchen losartan (COZAAR) 50 MG tablet Take 1 tablet (50 mg total) by mouth daily.  . Magnesium 250 MG TABS Take 250 mg by mouth daily.  . montelukast (SINGULAIR) 10 MG tablet Take 10 mg by mouth at bedtime.   . Multiple Vitamin (MULTIVITAMIN WITH MINERALS) TABS tablet Take 1 tablet by mouth daily. One-A-Day Active 65+  . sertraline (ZOLOFT) 50 MG tablet Take 1 tablet (50 mg total) by mouth daily.   No facility-administered encounter medications on file as of 12/11/2020.    Allergies (verified) Ivp dye [iodinated diagnostic agents], Iodine, and Tape   History: Past Medical History:  Diagnosis Date  . Anemia    distant past  . Anxiety   . Arthritis    "everywhere" - big toes worst  . Chronic atrial fibrillation (HCC)    a. on eliquis; b. CHADS2VASc at least 2 (age x 1, female)  . Depression   . GERD (gastroesophageal reflux disease)    RARE  . Heart murmur    mild - followed by PCP  . Knee pain   . Motion sickness    back seat of car  . OSA on CPAP    CPAP-4 PSI  . 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 Seasonal allergies    takes allergy weekly  . Status post bilateral breast reduction 09/06/2016   Overview:  08/29/16   Past Surgical History:  Procedure Laterality Date  . BREAST REDUCTION SURGERY Bilateral 08/29/2016   Procedure: BILATERAL MAMMARY REDUCTION  (BREAST)WITH LIPOSUCTION;  Surgeon: Peggye Formlaire S Dillingham, DO;  Location: [facility];  Service: Plastics;  Laterality: Bilateral;  . CATARACT EXTRACTION W/PHACO Left 05/03/2015   Procedure: CATARACT EXTRACTION PHACO AND INTRAOCULAR LENS PLACEMENT (IOC);  Surgeon: Lockie Molahadwick Brasington, MD;  [facility];  Service: Ophthalmology;  Laterality: Left;  CPAP  . CATARACT EXTRACTION W/PHACO Right 10/09/2016   Procedure: CATARACT EXTRACTION PHACO AND INTRAOCULAR LENS PLACEMENT (IOC);  Surgeon: Lockie Molahadwick Brasington, MD;  [facility];  Service: Ophthalmology;  Laterality: Right;  sleep apnea  . CHONDROPLASTY Right 04/29/2016   Procedure: CHONDROPLASTY;  Surgeon: Donato HeinzJames P Hooten, MD;  Location: [facility];  Service: Orthopedics;  Laterality: Right;  . COLONOSCOPY WITH PROPOFOL N/A 10/24/2018   Procedure: COLONOSCOPY WITH PROPOFOL;  Surgeon: Pasty Spillersahiliani, Varnita B, MD;  Location: [facility];  Service: Endoscopy;  Laterality: N/A;  . EYE SURGERY    . KNEE ARTHROPLASTY Right 03/25/2018   Procedure: COMPUTER ASSISTED TOTAL KNEE ARTHROPLASTY;  Surgeon: Donato HeinzHooten, James P, MD;  Location: [facility];  Service: Orthopedics;  Laterality: Right;  . KNEE ARTHROSCOPY WITH LATERAL MENISECTOMY  04/29/2016   Procedure: KNEE ARTHROSCOPY WITH LATERAL MENISECTOMY;  Surgeon: Donato HeinzJames P Hooten, MD;  Location: [facility];  Service: Orthopedics;;  . KNEE ARTHROSCOPY WITH MEDIAL MENISECTOMY  04/29/2016   Procedure: KNEE ARTHROSCOPY WITH MEDIAL MENISECTOMY;  Surgeon: Donato HeinzJames P Hooten, MD;  Location: [facility];  Service: Orthopedics;;  . REDUCTION MAMMAPLASTY Bilateral 09/2016  . RETINAL DETACHMENT SURGERY Left May 03, 2015   [facility]ton[facility]  . TUBAL LIGATION     Family History  Problem Relation Age of Onset  . Alcoholism Other   . Heart disease Other   . Breast cancer Neg Hx    Social History   Socioeconomic History  . Marital status: Widowed    Spouse name: Not on file  . Number of children: Not on file  . Years of education: Not on file  . Highest education level: Not on file  Occupational History  . Not on file  Tobacco Use  . Smoking status: Former Smoker    Packs/day: 0.25    Years: 2.00    Pack years: 0.50    Types: Cigarettes    Quit date: 10/21/1969    Years since quitting: 51.1  . Smokeless tobacco: Never Used  Vaping Use  . Vaping Use: Never used  Substance and Sexual Activity  . Alcohol use: Not Currently    Comment: occasional glass of wine  . Drug use: No  . Sexual activity: Not on file  Other Topics Concern  . Not on  file  Social History Narrative  . Not on file   Social Determinants of Health   Financial Resource Strain: Low Risk   . Difficulty of Paying Living Expenses: Not hard at all  Food Insecurity: No Food Insecurity  . Worried About Programme researcher, broadcasting/film/videounning Out of Food in the Last Year: Never true  . Ran Out of Food in the Last Year: Never true  Transportation Needs: No Transportation Needs  . Lack of Transportation (Medical): No  . Lack of Transportation (Non-Medical): No  Physical Activity: Not on file  Stress: No Stress Concern Present  . Feeling of Stress : Not at all  Social Connections: Unknown  . Frequency of Communication with Friends and Family: More than three times a week  .  Frequency of Social Gatherings with Friends and Family: Not on file  . Attends Religious Services: Not on file  . Active Member of Clubs or Organizations: Not on file  . Attends Banker Meetings: Not on file  . Marital Status: Not on file    Tobacco Counseling Counseling given: Not Answered   Clinical Intake:  Pre-visit preparation completed: Yes        Diabetes: No  How often do you need to have someone help you when you read instructions, pamphlets, or other written materials from your doctor or pharmacy?: 1 - Never    Interpreter Needed?: No      Activities of Daily Living In your present state of health, do you have any difficulty performing the following activities: 12/11/2020 09/19/2020  Hearing? N N  Vision? N N  Difficulty concentrating or making decisions? N N  Walking or climbing stairs? N N  Dressing or bathing? N N  Doing errands, shopping? N N  Preparing Food and eating ? N -  Using the Toilet? N -  In the past six months, have you accidently leaked urine? N -  Do you have problems with loss of bowel control? N -  Managing your Medications? N -  Managing your Finances? N -  Housekeeping or managing your Housekeeping? N -  Some recent data might be hidden    Patient  Care Team: Glori Luis, MD as PCP - General (Family Medicine) Iran Ouch, MD as PCP - Cardiology (Cardiology)  Indicate any recent Medical Services you may have received from other than [facility] providers in the past year (date may be approximate).     Assessment:   This is a routine wellness examination for Shericka.  I connected with Cherica today by telephone and verified that I am speaking with the correct person using two identifiers. Location patient: home Location provider: work Persons participating in the virtual visit: patient, Engineer, civil (consulting).    I discussed the limitations, risks, security and privacy concerns of performing an evaluation and management service by telephone and the availability of in person appointments. The patient expressed understanding and verbally consented to this telephonic visit.    Interactive audio and video telecommunications were attempted between this provider and patient, however failed, due to patient having technical difficulties OR patient did not have access to video capability.  We continued and completed visit with audio only.  Some vital signs may be absent or patient reported.   Hearing/Vision screen  Hearing Screening             Right ear:           Left ear:           Comments: Patient is able to hear conversational tones without difficulty.  No issues reported.  Vision Screening Comments: Wears corrective lenses  Cataract extraction, bilateral  Glaucoma suspect; yes  Visual acuity not assessed, virtual visit. They have seen their ophthalmologist in the last 6 months.   Dietary issues and exercise activities discussed: Current Exercise Habits: Home exercise routine, Type of exercise: walking, Time (Minutes): 20, Frequency (Times/Week): 4, Weekly Exercise (Minutes/Week): 80, Intensity: Moderate  Healthy diet Good water intake  Goals    . Maintain healthy lifestyle     Stay  active Healthy diet       Depression Screen PHQ 2/9 Scores 12/11/2020 10/03/2020 10/12/2019 07/26/2019 11/03/2018 01/01/2017 09/04/2015  PHQ - 2 Score 0 0 1 0 0 0 0  PHQ- 9  Score - - - 6 - - -    Fall Risk Fall Risk  12/11/2020 10/12/2019 07/26/2019 11/03/2018 04/20/2018  Falls in the past year? 0 0 0 0 No  Number falls in past yr: 0 - - 0 -  Injury with Fall? 0 - - 0 -  Follow up Falls evaluation completed Falls prevention discussed Falls evaluation completed - -  Comment - - - - -    FALL RISK PREVENTION PERTAINING TO THE HOME: Handrails in use when climbing stairs? Yes  Home free of loose throw rugs in walkways, pet beds, electrical cords, etc? Yes  Adequate lighting in your home to reduce risk of falls? Yes   ASSISTIVE DEVICES UTILIZED TO PREVENT FALLS: Use of a cane, walker or w/c? No   TIMED UP AND GO: Was the test performed? No . Virtual visit.   Cognitive Function:  Patient is alert and oriented x3.  Enjoys sodoku, Copywriter, advertising, words with friends and other brain challenging activities.  MMSE/6CIT deferred. Normal by direct communication/observation.   MMSE - Mini Mental State Exam 01/01/2017  Orientation to time 5  Orientation to Place 5  Registration 3  Attention/ Calculation 5  Recall 3  Language- name 2 objects 2  Language- repeat 1  Language- follow 3 step command 3  Language- read & follow direction 1  Write a sentence 1  Copy design 1  Total score 30     6CIT Screen 10/12/2019  What Year? 0 points  What month? 0 points  What time? 0 points  Count back from 20 0 points  Months in reverse 2 points  Repeat phrase 0 points  Total Score 2    Immunizations Immunization History  Administered Date(s) Administered  . Fluad Quad(high Dose 65+) 07/26/2019, 09/20/2020  . Influenza, High Dose Seasonal PF 08/30/2015, 08/06/2016, 08/14/2017, 11/03/2018  . Moderna Sars-Covid-2 Vaccination 12/08/2019, 01/18/2020  . Pneumococcal Conjugate-13 01/01/2013  .  Pneumococcal Polysaccharide-23 01/01/2014  . Zoster Recombinat (Shingrix) 01/02/2016   TDAP status: Due, Education has been provided regarding the importance of this vaccine. Advised may receive this vaccine at local pharmacy or Health Dept. Aware to provide a copy of the vaccination record if obtained from local pharmacy or Health Dept. Verbalized acceptance and understanding. Deferred.   Health Maintenance Health Maintenance  Topic Date Due  . MAMMOGRAM  11/05/2019  . TETANUS/TDAP  12/11/2021 (Originally 07/29/1965)  . COLONOSCOPY (Pts 45-35yrs Insurance coverage will need to be confirmed)  10/24/2028  . INFLUENZA VACCINE  Completed  . DEXA SCAN  Completed  . Hepatitis C Screening  Completed  . PNA vac Low Risk Adult  Completed  . COVID-19 Vaccine  Discontinued   Colorectal cancer screening: Type of screening: Colonoscopy. Completed 10/24/18. Repeat every 10 years  Mammogram status: Completed 11/04/17. Repeat every year. Plans to schedule.   Bone Density status: Completed 02/06/17. Results reflect: Bone density results: NORMAL. Repeat every 5 years.   Covid vaccine booster- deferred per patient.   Lung Cancer Screening: (Low Dose CT Chest recommended if Age 48-80 years, 30 pack-year currently smoking OR have quit w/in 15years.) does not qualify.   Vision Screening: Recommended annual ophthalmology exams for early detection of glaucoma and other disorders of the eye. Is the patient up to date with their annual eye exam?  Yes   Dental Screening: Recommended annual dental exams for proper oral hygiene.  Community Resource Referral / Chronic Care Management: CRR required this visit?  No   CCM required this visit?  No      Plan:   Keep all routine maintenance appointments.   Follow up 12/15/20 @ 9:00  Follow up 04/06/21 @ 1:15  I have personally reviewed and noted the following in the patient's chart:   . Medical and social history . Use of alcohol, tobacco or illicit drugs   . Current medications and supplements . Functional ability and status . Nutritional status . Physical activity . Advanced directives . List of other physicians . Hospitalizations, surgeries, and ER visits in previous 12 months . Vitals . Screenings to include cognitive, depression, and falls . Referrals and appointments  In addition, I have reviewed and discussed with patient certain preventive protocols, quality metrics, and best practice recommendations. A written personalized care plan for preventive services as well as general preventive health recommendations were provided to patient via mychart.     Ashok Pall, LPN   7/62/2633

## 2020-12-11 NOTE — Patient Instructions (Addendum)
[name] , Thank you for taking time to come for your Medicare Wellness Visit. I appreciate your ongoing commitment to your health goals. Please review the following plan we discussed and let me know if I can assist you in the future.   These are the goals we discussed: Goals    . Maintain healthy lifestyle     Stay active Healthy diet        This is a list of the screening recommended for you and due dates:  Health Maintenance  Topic Date Due  . Mammogram  11/05/2019  . Tetanus Vaccine  12/11/2021*  . Colon Cancer Screening  10/24/2028  . Flu Shot  Completed  . DEXA scan (bone density measurement)  Completed  .  Hepatitis C: One time screening is recommended by Center for Disease Control  (CDC) for  adults born from [birth year] through 1964.   Completed  . Pneumonia vaccines  Completed  . COVID-19 Vaccine  Discontinued  *Topic was postponed. The date shown is not the original due date.    Immunizations Immunization History  Administered Date(s) Administered  . Fluad Quad(high Dose 65+) 07/26/2019, 09/20/2020  . Influenza, High Dose Seasonal PF 08/30/2015, 08/06/2016, 08/14/2017, 11/03/2018  . Moderna Sars-Covid-2 Vaccination 12/08/2019, 01/18/2020  . Pneumococcal Conjugate-13 01/01/2013  . Pneumococcal Polysaccharide-23 01/01/2014  . Zoster Recombinat (Shingrix) 01/02/2016   Keep all routine maintenance appointments.   Follow up 12/15/20 @ 9:00  Follow up 04/06/21 @ 1:15  Advanced directives: on file.  Conditions/risks identified: none new.   Follow up in one year for your annual wellness visit.   Preventive Care 11 Years and Older, Female Preventive care refers to lifestyle choices and visits with your health care provider that can promote health and wellness. What does preventive care include?  A yearly physical exam. This is also called an annual well check.  Dental exams once or twice a year.  Routine eye exams. Ask your health care provider how often you should  have your eyes checked.  Personal lifestyle choices, including:  Daily care of your teeth and gums.  Regular physical activity.  Eating a healthy diet.  Avoiding tobacco and drug use.  Limiting alcohol use.  Practicing safe sex.  Taking low-dose aspirin every day.  Taking vitamin and mineral supplements as recommended by your health care provider. What happens during an annual well check? The services and screenings done by your health care provider during your annual well check will depend on your age, overall health, lifestyle risk factors, and family history of disease. Counseling  Your health care provider may ask you questions about your:  Alcohol use.  Tobacco use.  Drug use.  Emotional well-being.  Home and relationship well-being.  Sexual activity.  Eating habits.  History of falls.  Memory and ability to understand (cognition).  Work and work Astronomer.  Reproductive health. Screening  You may have the following tests or measurements:  Height, weight, and BMI.  Blood pressure.  Lipid and cholesterol levels. These may be checked every 5 years, or more frequently if you are over 47 years old.  Skin check.  Lung cancer screening. You may have this screening every year starting at age 64 if you have a 30-pack-year history of smoking and currently smoke or have quit within the past 15 years.  Fecal occult blood test (FOBT) of the stool. You may have this test every year starting at age 69.  Flexible sigmoidoscopy or colonoscopy. You may have a sigmoidoscopy every  5 years or a colonoscopy every 10 years starting at age 74.  Hepatitis C blood test.  Hepatitis B blood test.  Sexually transmitted disease (STD) testing.  Diabetes screening. This is done by checking your blood sugar (glucose) after you have not eaten for a while (fasting). You may have this done every 1-3 years.  Bone density scan. This is done to screen for osteoporosis. You may  have this done starting at age 60.  Mammogram. This may be done every 1-2 years. Talk to your health care provider about how often you should have regular mammograms. Talk with your health care provider about your test results, treatment options, and if necessary, the need for more tests. Vaccines  Your health care provider may recommend certain vaccines, such as:  Influenza vaccine. This is recommended every year.  Tetanus, diphtheria, and acellular pertussis (Tdap, Td) vaccine. You may need a Td booster every 10 years.  Zoster vaccine. You may need this after age 67.  Pneumococcal 13-valent conjugate (PCV13) vaccine. One dose is recommended after age 54.  Pneumococcal polysaccharide (PPSV23) vaccine. One dose is recommended after age 44. Talk to your health care provider about which screenings and vaccines you need and how often you need them. This information is not intended to replace advice given to you by your health care provider. Make sure you discuss any questions you have with your health care provider. Document Released: 11/03/2015 Document Revised: 06/26/2016 Document Reviewed: 08/08/2015 Elsevier Interactive Patient Education  2017 [employer].  Fall Prevention in the Home Falls can cause injuries. They can happen to people of all ages. There are many things you can do to make your home safe and to help prevent falls. What can I do on the outside of my home?  Regularly fix the edges of walkways and driveways and fix any cracks.  Remove anything that might make you trip as you walk through a door, such as a raised step or threshold.  Trim any bushes or trees on the path to your home.  Use bright outdoor lighting.  Clear any walking paths of anything that might make someone trip, such as rocks or tools.  Regularly check to see if handrails are loose or broken. Make sure that both sides of any steps have handrails.  Any raised decks and porches should have guardrails  on the edges.  Have any leaves, snow, or ice cleared regularly.  Use sand or salt on walking paths during winter.  Clean up any spills in your garage right away. This includes oil or grease spills. What can I do in the bathroom?  Use night lights.  Install grab bars by the toilet and in the tub and shower. Do not use towel bars as grab bars.  Use non-skid mats or decals in the tub or shower.  If you need to sit down in the shower, use a plastic, non-slip stool.  Keep the floor dry. Clean up any water that spills on the floor as soon as it happens.  Remove soap buildup in the tub or shower regularly.  Attach bath mats securely with double-sided non-slip rug tape.  Do not have throw rugs and other things on the floor that can make you trip. What can I do in the bedroom?  Use night lights.  Make sure that you have a light by your bed that is easy to reach.  Do not use any sheets or blankets that are too big for your bed. They should not  hang down onto the floor.  Have a firm chair that has side arms. You can use this for support while you get dressed.  Do not have throw rugs and other things on the floor that can make you trip. What can I do in the kitchen?  Clean up any spills right away.  Avoid walking on wet floors.  Keep items that you use a lot in easy-to-reach places.  If you need to reach something above you, use a strong step stool that has a grab bar.  Keep electrical cords out of the way.  Do not use floor polish or wax that makes floors slippery. If you must use wax, use non-skid floor wax.  Do not have throw rugs and other things on the floor that can make you trip. What can I do with my stairs?  Do not leave any items on the stairs.  Make sure that there are handrails on both sides of the stairs and use them. Fix handrails that are broken or loose. Make sure that handrails are as long as the stairways.  Check any carpeting to make sure that it is  firmly attached to the stairs. Fix any carpet that is loose or worn.  Avoid having throw rugs at the top or bottom of the stairs. If you do have throw rugs, attach them to the floor with carpet tape.  Make sure that you have a light switch at the top of the stairs and the bottom of the stairs. If you do not have them, ask someone to add them for you. What else can I do to help prevent falls?  Wear shoes that:  Do not have high heels.  Have rubber bottoms.  Are comfortable and fit you well.  Are closed at the toe. Do not wear sandals.  If you use a stepladder:  Make sure that it is fully opened. Do not climb a closed stepladder.  Make sure that both sides of the stepladder are locked into place.  Ask someone to hold it for you, if possible.  Clearly mark and make sure that you can see:  Any grab bars or handrails.  First and last steps.  Where the edge of each step is.  Use tools that help you move around (mobility aids) if they are needed. These include:  Canes.  Walkers.  Scooters.  Crutches.  Turn on the lights when you go into a dark area. Replace any light bulbs as soon as they burn out.  Set up your furniture so you have a clear path. Avoid moving your furniture around.  If any of your floors are uneven, fix them.  If there are any pets around you, be aware of where they are.  Review your medicines with your doctor. Some medicines can make you feel dizzy. This can increase your chance of falling. Ask your doctor what other things that you can do to help prevent falls. This information is not intended to replace advice given to you by your health care provider. Make sure you discuss any questions you have with your health care provider. Document Released: 08/03/2009 Document Revised: 03/14/2016 Document Reviewed: 11/11/2014 Elsevier Interactive Patient Education  2017 [employer].

## 2020-12-12 DIAGNOSIS — J301 Allergic rhinitis due to pollen: Secondary | ICD-10-CM | POA: Diagnosis not present

## 2020-12-15 ENCOUNTER — Ambulatory Visit (INDEPENDENT_AMBULATORY_CARE_PROVIDER_SITE_OTHER): Payer: Medicare HMO | Admitting: Family Medicine

## 2020-12-15 ENCOUNTER — Other Ambulatory Visit: Payer: Self-pay

## 2020-12-15 ENCOUNTER — Encounter: Payer: Self-pay | Admitting: Family Medicine

## 2020-12-15 VITALS — BP 130/80 | HR 83 | Temp 97.5°F | Ht 65.0 in | Wt 239.2 lb

## 2020-12-15 DIAGNOSIS — Z23 Encounter for immunization: Secondary | ICD-10-CM | POA: Diagnosis not present

## 2020-12-15 DIAGNOSIS — I10 Essential (primary) hypertension: Secondary | ICD-10-CM | POA: Diagnosis not present

## 2020-12-15 DIAGNOSIS — I639 Cerebral infarction, unspecified: Secondary | ICD-10-CM

## 2020-12-15 DIAGNOSIS — I482 Chronic atrial fibrillation, unspecified: Secondary | ICD-10-CM | POA: Diagnosis not present

## 2020-12-15 DIAGNOSIS — S61412A Laceration without foreign body of left hand, initial encounter: Secondary | ICD-10-CM | POA: Diagnosis not present

## 2020-12-15 MED ORDER — AMLODIPINE BESYLATE 5 MG PO TABS: 5.0000 mg | ORAL_TABLET | Freq: Every day | ORAL | 3 refills | Status: DC

## 2020-12-15 NOTE — Assessment & Plan Note (Signed)
The patient has had some slightly low diastolic blood pressures on her current regimen. Thus we will not change her regimen at this time. She will continue losartan 50 mg once daily and amlodipine 5 mg once daily. She will monitor for lightheadedness and if she has recurrent issues with this she will let us know.

## 2020-12-15 NOTE — Patient Instructions (Signed)
Nice to see you. We will have you continue your losartan 50 mg once daily and amlodipine 5 mg once daily. If you start to notice lightheadedness or your blood pressure trending down further please let us know. Please continue to cleanse your left hand with soap and water and use an antibiotic ointment on it. You should change your dressing at least twice daily. If you notice any signs of infection such as redness, drainage, increased pain, or swelling or any new symptoms please seek medical attention.

## 2020-12-15 NOTE — Assessment & Plan Note (Signed)
Rate controlled on no medication. She will continue Eliquis 5 mg twice daily.

## 2020-12-15 NOTE — Progress Notes (Signed)
Marikay Alar, MD Phone: [phone]  Crystal Haas is a 75 y.o. female who presents today for f/u.  HYPERTENSION  Disease Monitoring  Home BP Monitoring 94-163/51-73, 50% of diastolic BPs are in the 50s Chest pain- no    Dyspnea- no Medications  Compliance-  Taking losartan 50 mg daily, amlodipine 5 mg daily. Lightheadedness-  Once when BP was 94/53  Edema- no  Afib: Taking Eliquis. No bleeding issues. No palpitations.  Left hand wound: Patient notes she hit her left hand on the door 2 days ago. She notes the bruising has improved quite a bit though she does have a cut/skin tear on the ulnar aspect of the dorsum of her hand. She has been cleansing it with soap and water. Using an antibiotic ointment on it. There is pain. She is due for a tetanus vaccine.  CVA: She saw a neurologist at [facility] for second opinion on her prior strokes. This note was reviewed. They advise she continue on her Eliquis and noted that her strokes are likely due to her history of atrial fibrillation. They recommended continuing Lipitor. They added on amlodipine to help control her blood pressure.    Social History   Tobacco Use  Smoking Status Former Smoker  . Packs/day: 0.25  . Years: 2.00  . Pack years: 0.50  . Types: Cigarettes  . Quit date: 10/21/1969  . Years since quitting: 51.1  Smokeless Tobacco Never Used    Current Outpatient Medications on File Prior to Visit  Medication Sig Dispense Refill  . apixaban (ELIQUIS) 5 MG TABS tablet Take 1 tablet (5 mg total) by mouth 2 (two) times daily. 180 tablet 1  . atorvastatin (LIPITOR) 80 MG tablet Take 1 tablet (80 mg total) by mouth at bedtime. 90 tablet 0  . azelastine (OPTIVAR) 0.05 % ophthalmic solution Place 1 drop into both eyes 2 (two) times daily.    . B Complex-C (B-COMPLEX WITH VITAMIN C) tablet Take 1 tablet by mouth daily.    . Black Cohosh 40 MG CAPS Take 40 mg by mouth daily.    . cetirizine (ZYRTEC) 10 MG tablet Take 1 tablet (10 mg total)  by mouth daily. 90 tablet 0  . EPIPEN 2-PAK 0.3 MG/0.3ML SOAJ injection Inject 0.3 mLs (0.3 mg total) into the muscle as directed. AS NEEDED FOR ANAPHYLAXIS 2 Device 0  . fluticasone (FLONASE) 50 MCG/ACT nasal spray Place 2 sprays into both nostrils daily. 16 g 0  . GLUCOSAMINE-CHONDROITIN DS PO Take 3,000 mg by mouth daily.    Marland Kitchen losartan (COZAAR) 50 MG tablet Take 1 tablet (50 mg total) by mouth daily. 30 tablet 5  . Magnesium 250 MG TABS Take 250 mg by mouth daily.    . montelukast (SINGULAIR) 10 MG tablet Take 10 mg by mouth at bedtime.     . Multiple Vitamin (MULTIVITAMIN WITH MINERALS) TABS tablet Take 1 tablet by mouth daily. One-A-Day Active 65+    . sertraline (ZOLOFT) 50 MG tablet Take 1 tablet (50 mg total) by mouth daily. 30 tablet 0   No current facility-administered medications on file prior to visit.     ROS see history of present illness  Objective  Physical Exam Vitals:   12/15/20 0901  BP: 130/80  Pulse: 83  Temp: (!) 97.5 F (36.4 C)  SpO2: 98%    BP Readings from Last 3 Encounters:  12/15/20 130/80  09/21/20 130/80  09/20/20 (!) 159/83   Wt Readings from Last 3 Encounters:  12/15/20 239 lb  3.2 oz (108.5 kg)  12/11/20 225 lb (102.1 kg)  10/03/20 225 lb (102.1 kg)    Physical Exam Constitutional:      General: She is not in acute distress.    Appearance: She is not diaphoretic.  Cardiovascular:     Rate and Rhythm: Normal rate. Rhythm irregularly irregular.     Heart sounds: Normal heart sounds.  Pulmonary:     Effort: Pulmonary effort is normal.     Breath sounds: Normal breath sounds.  Musculoskeletal:        General: No edema.       Hands:  Skin:    General: Skin is warm and dry.  Neurological:     Mental Status: She is alert.      Assessment/Plan: Please see individual problem list.  Problem List Items Addressed This Visit    Chronic atrial fibrillation (HCC)    Rate controlled on no medication. She will continue Eliquis 5 mg twice  daily.      Relevant Medications   amLODipine (NORVASC) 5 MG tablet   Cut of left hand - Primary    No signs of infection. Tetanus vaccine was brought up-to-date. She will continue topical antibiotic ointment. She will wear a bandage and change it at least twice daily. She will cleanse with soap and water. Advised to monitor for signs of infection and to be seen right away if she develops any signs of infection.      Relevant Orders   Td : Tetanus/diphtheria >7yo Preservative  free (Completed)   CVA (cerebral vascular accident) (HCC)    Likely related to her history of A. fib. We will continue risk factor management. She will continue on her Eliquis.      Relevant Medications   amLODipine (NORVASC) 5 MG tablet   Hypertension    The patient has had some slightly low diastolic blood pressures on her current regimen. Thus we will not change her regimen at this time. She will continue losartan 50 mg once daily and amlodipine 5 mg once daily. She will monitor for lightheadedness and if she has recurrent issues with this she will let us know.      Relevant Medications   amLODipine (NORVASC) 5 MG tablet      This visit occurred during the SARS-CoV-2 public health emergency.  Safety protocols were in place, including screening questions prior to the visit, additional usage of staff PPE, and extensive cleaning of exam room while observing appropriate contact time as indicated for disinfecting solutions.    Marikay Alar, MD [facility]

## 2020-12-15 NOTE — Assessment & Plan Note (Signed)
No signs of infection. Tetanus vaccine was brought up-to-date. She will continue topical antibiotic ointment. She will wear a bandage and change it at least twice daily. She will cleanse with soap and water. Advised to monitor for signs of infection and to be seen right away if she develops any signs of infection.

## 2020-12-15 NOTE — Assessment & Plan Note (Signed)
Likely related to her history of A. fib. We will continue risk factor management. She will continue on her Eliquis.

## 2020-12-19 DIAGNOSIS — J301 Allergic rhinitis due to pollen: Secondary | ICD-10-CM | POA: Diagnosis not present

## 2021-01-09 DIAGNOSIS — J301 Allergic rhinitis due to pollen: Secondary | ICD-10-CM | POA: Diagnosis not present

## 2021-01-16 DIAGNOSIS — J301 Allergic rhinitis due to pollen: Secondary | ICD-10-CM | POA: Diagnosis not present

## 2021-01-23 DIAGNOSIS — J301 Allergic rhinitis due to pollen: Secondary | ICD-10-CM | POA: Diagnosis not present

## 2021-01-23 DIAGNOSIS — J0181 Other acute recurrent sinusitis: Secondary | ICD-10-CM | POA: Diagnosis not present

## 2021-01-30 DIAGNOSIS — J301 Allergic rhinitis due to pollen: Secondary | ICD-10-CM | POA: Diagnosis not present

## 2021-02-06 ENCOUNTER — Other Ambulatory Visit: Payer: Self-pay

## 2021-02-06 ENCOUNTER — Encounter: Payer: Self-pay | Admitting: Cardiovascular Disease

## 2021-02-06 ENCOUNTER — Ambulatory Visit: Payer: Medicare HMO | Admitting: Cardiovascular Disease

## 2021-02-06 VITALS — BP 120/60 | HR 66 | Ht 65.0 in | Wt 244.1 lb

## 2021-02-06 DIAGNOSIS — Z8673 Personal history of transient ischemic attack (TIA), and cerebral infarction without residual deficits: Secondary | ICD-10-CM

## 2021-02-06 DIAGNOSIS — E785 Hyperlipidemia, unspecified: Secondary | ICD-10-CM

## 2021-02-06 DIAGNOSIS — I10 Essential (primary) hypertension: Secondary | ICD-10-CM | POA: Diagnosis not present

## 2021-02-06 DIAGNOSIS — I482 Chronic atrial fibrillation, unspecified: Secondary | ICD-10-CM | POA: Diagnosis not present

## 2021-02-06 NOTE — Progress Notes (Signed)
Cardiology Office Note   Date:  02/06/2021   ID:  Crystal Haas, DOB [birth year], MRN [id number]  PCP:  Glori Luis, MD  Cardiologist:   Lorine Bears, MD   Chief Complaint  Patient presents with  . Other    4 month f/u no complaints today. Meds reviewed verbally with pt.      History of Present Illness: Crystal Haas is a 75 y.o. female who presents for a follow-up visit regarding chronic atrial fibrillation and hypertension.  Echocardiogram in July, 2016 showed normal LV systolic function with mildly dilated right and left atrium.  She is tolerating anticoagulation with Eliquis.   She has known history of sleep apnea on CPAP. She had previous knee surgery.  She was hospitalized in November 2021 with numbness, slurred speech and weakness of the right arm and leg.  Symptoms lasted for about 20 minutes. MRI showed small infarcts in the left insula and overlying frontal operculum with possible infarct in the left corona radiate.    Echocardiogram showed normal LV systolic function with no obvious source of cardiac embolism.  Carotid Doppler showed no significant disease.  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  She has been doing well with no recent chest pain, shortness of breath or palpitations.  No issues with anticoagulation.  Past Medical History:  Diagnosis Date  . Anemia    distant past  . Anxiety   . Arthritis    "everywhere" - big toes worst  . Chronic atrial fibrillation (HCC)    a. on eliquis; b. CHADS2VASc at least 2 (age x 1, female)  . Depression   . GERD (gastroesophageal reflux disease)    RARE  . Heart murmur    mild - followed by PCP  . Knee pain   . Motion sickness    back seat of car  . OSA on CPAP    CPAP-4 PSI  . 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 Seasonal allergies    takes allergy weekly  . Status post bilateral  breast reduction 09/06/2016   Overview:  08/29/16    Past Surgical History:  Procedure Laterality Date  . BREAST REDUCTION SURGERY Bilateral 08/29/2016   Procedure: BILATERAL MAMMARY REDUCTION  (BREAST)WITH LIPOSUCTION;  Surgeon: Peggye Form, DO;  Location: [facility];  Service: Plastics;  Laterality: Bilateral;  . CATARACT EXTRACTION W/PHACO Left 05/03/2015   Procedure: CATARACT EXTRACTION PHACO AND INTRAOCULAR LENS PLACEMENT (IOC);  Surgeon: Lockie Mola, MD;  Location: [facility];  Service: Ophthalmology;  Laterality: Left;  CPAP  . CATARACT EXTRACTION W/PHACO Right 10/09/2016   Procedure: CATARACT EXTRACTION PHACO AND INTRAOCULAR LENS PLACEMENT (IOC);  Surgeon: Lockie Mola, MD;  Location: [facility];  Service: Ophthalmology;  Laterality: Right;  sleep apnea  . CHONDROPLASTY Right 04/29/2016   Procedure: CHONDROPLASTY;  Surgeon: Donato Heinz, MD;  Location: [facility];  Service: Orthopedics;  Laterality: Right;  . COLONOSCOPY WITH PROPOFOL N/A 10/24/2018   Procedure: COLONOSCOPY WITH PROPOFOL;  Surgeon: Pasty Spillers, MD;  Location: [facility];  Service: Endoscopy;  Laterality: N/A;  . EYE SURGERY    . KNEE ARTHROPLASTY Right 03/25/2018   Procedure: COMPUTER ASSISTED TOTAL KNEE ARTHROPLASTY;  Surgeon: Donato Heinz, MD;  Location: [facility];  Service: Orthopedics;  Laterality: Right;  . KNEE ARTHROSCOPY WITH LATERAL MENISECTOMY  04/29/2016  Procedure: KNEE ARTHROSCOPY WITH LATERAL MENISECTOMY;  Surgeon: Donato Heinz, MD;  Location: [facility];  Service: Orthopedics;;  . KNEE ARTHROSCOPY WITH MEDIAL MENISECTOMY  04/29/2016   Procedure: KNEE ARTHROSCOPY WITH MEDIAL MENISECTOMY;  Surgeon: Donato Heinz, MD;  Location: [facility];  Service: Orthopedics;;  . REDUCTION MAMMAPLASTY Bilateral 09/2016  . RETINAL DETACHMENT SURGERY Left May 03, 2015   Dr. Inez Pilgrim, [facility]  . TUBAL LIGATION       Current Outpatient Medications   Medication Sig Dispense Refill  . amLODipine (NORVASC) 5 MG tablet Take 1 tablet (5 mg total) by mouth daily. 90 tablet 3  . apixaban (ELIQUIS) 5 MG TABS tablet Take 1 tablet (5 mg total) by mouth 2 (two) times daily. 180 tablet 1  . atorvastatin (LIPITOR) 80 MG tablet Take 1 tablet (80 mg total) by mouth at bedtime. 90 tablet 0  . azelastine (OPTIVAR) 0.05 % ophthalmic solution Place 1 drop into both eyes 2 (two) times daily.    . B Complex-C (B-COMPLEX WITH VITAMIN C) tablet Take 1 tablet by mouth daily.    . Black Cohosh 40 MG CAPS Take 40 mg by mouth daily.    . cetirizine (ZYRTEC) 10 MG tablet Take 1 tablet (10 mg total) by mouth daily. 90 tablet 0  . EPIPEN 2-PAK 0.3 MG/0.3ML SOAJ injection Inject 0.3 mLs (0.3 mg total) into the muscle as directed. AS NEEDED FOR ANAPHYLAXIS 2 Device 0  . fluticasone (FLONASE) 50 MCG/ACT nasal spray Place 2 sprays into both nostrils daily. 16 g 0  . GLUCOSAMINE-CHONDROITIN DS PO Take 3,000 mg by mouth daily.    Marland Kitchen losartan (COZAAR) 50 MG tablet Take 25 mg by mouth daily.    . Magnesium 250 MG TABS Take 250 mg by mouth daily.    . montelukast (SINGULAIR) 10 MG tablet Take 10 mg by mouth at bedtime.     . Multiple Vitamin (MULTIVITAMIN WITH MINERALS) TABS tablet Take 1 tablet by mouth daily. One-A-Day Active 65+    . sertraline (ZOLOFT) 50 MG tablet Take 1 tablet (50 mg total) by mouth daily. 30 tablet 0   No current facility-administered medications for this visit.    Allergies:   Ivp dye [iodinated diagnostic agents], Iodine, and Tape    Social History:  The patient  reports that she quit smoking about 51 years ago. Her smoking use included cigarettes. She has a 0.50 pack-year smoking history. She has never used smokeless tobacco. She reports previous alcohol use. She reports that she does not use drugs.      ROS:  Please see the history of present illness.   Otherwise, review of systems are positive for none.   All other systems are reviewed and  negative.    PHYSICAL EXAM: VS:  BP 120/60 (BP Location: Left Arm, Patient Position: Sitting, Cuff Size: Large)   Pulse 66   Ht 5\' 5"  (1.651 m)   Wt 244 lb 2 oz (110.7 kg)   SpO2 98%   BMI 40.62 kg/m  , BMI Body mass index is 40.62 kg/m. GEN: Well nourished, well developed, in no acute distress  HEENT: normal  Neck: no JVD, carotid bruits, or masses Cardiac: Irregularly irregular; no murmurs, rubs, or gallops,no edema  Respiratory:  clear to auscultation bilaterally, normal work of breathing GI: soft, nontender, nondistended, + BS MS: no deformity or atrophy  Skin: warm and dry, no rash Neuro:  Strength and sensation are intact Psych: euthymic mood, full affect   EKG:  EKG  ordered today. EKG showed atrial fibrillation with ventricular rate of 66 bpm.  Recent Labs: 09/19/2020: BUN 8; Creatinine, Ser 0.97; Hemoglobin 14.2; Platelets 196; Potassium 4.4; Sodium 139 10/31/2020: ALT 16    Lipid Panel    Component Value Date/Time   CHOL 94 09/20/2020 0632   CHOL 186 09/04/2015 1051   TRIG 90 09/20/2020 0632   HDL 43 09/20/2020 0632   HDL 66 09/04/2015 1051   CHOLHDL 2.2 09/20/2020 0632   VLDL 18 09/20/2020 0632   LDLCALC 33 09/20/2020 0632   LDLCALC 99 09/04/2015 1051   LDLDIRECT 57.0 10/31/2020 1054      Wt Readings from Last 3 Encounters:  02/06/21 244 lb 2 oz (110.7 kg)  12/15/20 239 lb 3.2 oz (108.5 kg)  12/11/20 225 lb (102.1 kg)        ASSESSMENT AND PLAN:  1.  Chronic atrial fibrillation: Ventricular rate is controlled without any medication.  She is tolerating anticoagulation with Eliquis.    2.  Essential hypertension: Blood pressure is well controlled controlled.  3.   left MCA stroke: No recurrent symptoms.  Continue medical therapy.  4.  Hyperlipidemia: Continue high-dose atorvastatin.  Most recent lipid profile in January showed an LDL of 57.     Disposition:   FU with me in 6 months.  Signed,  Lorine Bears, MD  02/06/2021 1:40 PM     [facility]

## 2021-02-06 NOTE — Patient Instructions (Signed)

## 2021-02-13 DIAGNOSIS — J301 Allergic rhinitis due to pollen: Secondary | ICD-10-CM | POA: Diagnosis not present

## 2021-02-20 DIAGNOSIS — J301 Allergic rhinitis due to pollen: Secondary | ICD-10-CM | POA: Diagnosis not present

## 2021-02-27 DIAGNOSIS — J301 Allergic rhinitis due to pollen: Secondary | ICD-10-CM | POA: Diagnosis not present

## 2021-02-27 DIAGNOSIS — L2389 Allergic contact dermatitis due to other agents: Secondary | ICD-10-CM | POA: Diagnosis not present

## 2021-02-27 DIAGNOSIS — L309 Dermatitis, unspecified: Secondary | ICD-10-CM | POA: Diagnosis not present

## 2021-03-06 DIAGNOSIS — J301 Allergic rhinitis due to pollen: Secondary | ICD-10-CM | POA: Diagnosis not present

## 2021-03-07 ENCOUNTER — Other Ambulatory Visit: Payer: Self-pay

## 2021-03-12 DIAGNOSIS — G4733 Obstructive sleep apnea (adult) (pediatric): Secondary | ICD-10-CM | POA: Diagnosis not present

## 2021-03-13 DIAGNOSIS — J301 Allergic rhinitis due to pollen: Secondary | ICD-10-CM | POA: Diagnosis not present

## 2021-03-14 ENCOUNTER — Encounter: Payer: Self-pay | Admitting: Family Medicine

## 2021-03-14 ENCOUNTER — Ambulatory Visit (INDEPENDENT_AMBULATORY_CARE_PROVIDER_SITE_OTHER): Payer: Medicare HMO | Admitting: Family Medicine

## 2021-03-14 ENCOUNTER — Other Ambulatory Visit: Payer: Self-pay

## 2021-03-14 VITALS — BP 120/70 | HR 77 | Temp 98.6°F | Ht 65.0 in | Wt 237.2 lb

## 2021-03-14 DIAGNOSIS — I482 Chronic atrial fibrillation, unspecified: Secondary | ICD-10-CM | POA: Diagnosis not present

## 2021-03-14 DIAGNOSIS — I10 Essential (primary) hypertension: Secondary | ICD-10-CM | POA: Diagnosis not present

## 2021-03-14 DIAGNOSIS — Z1231 Encounter for screening mammogram for malignant neoplasm of breast: Secondary | ICD-10-CM | POA: Diagnosis not present

## 2021-03-14 DIAGNOSIS — E785 Hyperlipidemia, unspecified: Secondary | ICD-10-CM

## 2021-03-14 DIAGNOSIS — R21 Rash and other nonspecific skin eruption: Secondary | ICD-10-CM | POA: Insufficient documentation

## 2021-03-14 NOTE — Patient Instructions (Signed)
Nice to see you. Please stop the amlodipine.  If your blood pressure rises above 130/80 consistently please restart the amlodipine. Please call [phone] to schedule your mammogram.

## 2021-03-14 NOTE — Assessment & Plan Note (Signed)
Related to an allergic reaction.  She has been progressively improving.  She will monitor.  Amoxicillin added to her allergy list.

## 2021-03-14 NOTE — Progress Notes (Signed)
Marikay Alar, MD Phone: [phone]  Crystal Haas is a 75 y.o. female who presents today for f/u.   HYPERTENSION  Disease Monitoring  Home BP Monitoring not checking Chest pain- no    Dyspnea- no Medications  Compliance-  Taking amlodipine, losartan.  Edema- no The patient wants to see if she can come off of the amlodipine.  Atrial fibrillation: Taking Eliquis.  No bleeding issues.  No palpitations.  Hyperlipidemia: Taking Lipitor.  No rectal quadrant pain or myalgias.  Rash: This occurred after taking amoxicillin.  She was covered head to toe.  She was itching.  She saw dermatology and they placed her on hydroxyzine and triamcinolone.  She has progressively been improving.    Social History   Tobacco Use  Smoking Status Former Smoker  . Packs/day: 0.25  . Years: 2.00  . Pack years: 0.50  . Types: Cigarettes  . Quit date: 10/21/1969  . Years since quitting: 51.4  Smokeless Tobacco Never Used    Current Outpatient Medications on File Prior to Visit  Medication Sig Dispense Refill  . apixaban (ELIQUIS) 5 MG TABS tablet Take 1 tablet (5 mg total) by mouth 2 (two) times daily. 180 tablet 1  . azelastine (OPTIVAR) 0.05 % ophthalmic solution Place 1 drop into both eyes 2 (two) times daily.    . B Complex-C (B-COMPLEX WITH VITAMIN C) tablet Take 1 tablet by mouth daily.    . Black Cohosh 40 MG CAPS Take 40 mg by mouth daily.    . cetirizine (ZYRTEC) 10 MG tablet Take 1 tablet (10 mg total) by mouth daily. 90 tablet 0  . EPIPEN 2-PAK 0.3 MG/0.3ML SOAJ injection Inject 0.3 mLs (0.3 mg total) into the muscle as directed. AS NEEDED FOR ANAPHYLAXIS 2 Device 0  . fluticasone (FLONASE) 50 MCG/ACT nasal spray Place 2 sprays into both nostrils daily. 16 g 0  . GLUCOSAMINE-CHONDROITIN DS PO Take 3,000 mg by mouth daily.    Marland Kitchen losartan (COZAAR) 50 MG tablet Take 25 mg by mouth daily.    . Magnesium 250 MG TABS Take 250 mg by mouth daily.    . montelukast (SINGULAIR) 10 MG tablet Take 10  mg by mouth at bedtime.     . Multiple Vitamin (MULTIVITAMIN WITH MINERALS) TABS tablet Take 1 tablet by mouth daily. One-A-Day Active 65+    . sertraline (ZOLOFT) 50 MG tablet Take 1 tablet (50 mg total) by mouth daily. 30 tablet 0  . atorvastatin (LIPITOR) 80 MG tablet Take 1 tablet (80 mg total) by mouth at bedtime. 90 tablet 0   No current facility-administered medications on file prior to visit.     ROS see history of present illness  Objective  Physical Exam Vitals:   03/14/21 1422  BP: 120/70  Pulse: 77  Temp: 98.6 F (37 C)  SpO2: 99%    BP Readings from Last 3 Encounters:  03/14/21 120/70  02/06/21 120/60  12/15/20 130/80   Wt Readings from Last 3 Encounters:  03/14/21 237 lb 3.2 oz (107.6 kg)  02/06/21 244 lb 2 oz (110.7 kg)  12/15/20 239 lb 3.2 oz (108.5 kg)    Physical Exam Constitutional:      General: She is not in acute distress.    Appearance: She is not diaphoretic.  Cardiovascular:     Rate and Rhythm: Normal rate. Rhythm irregularly irregular.     Heart sounds: Normal heart sounds.  Pulmonary:     Effort: Pulmonary effort is normal.  Breath sounds: Normal breath sounds.  Skin:    General: Skin is warm and dry.  Neurological:     Mental Status: She is alert.      Assessment/Plan: Please see individual problem list.  Problem List Items Addressed This Visit    Chronic atrial fibrillation (HCC) - Primary    Rate controlled on no medications.  Discussed continuing Eliquis 5 mg twice daily.  She will continue to see cardiology as well.      Hypertension    I discussed that her current blood pressure is well controlled.  She wants to come off of the amlodipine and I advised this is reasonable as long as her blood pressure stays less than 130/80.  If it rises above 130/80 she will restart the amlodipine.  She will continue on losartan 25 mg daily.  She will return for nurse BP check in 1 month.      HLD (hyperlipidemia)    Well-controlled.   She will continue Lipitor 80 mg once daily.      Rash    Related to an allergic reaction.  She has been progressively improving.  She will monitor.  Amoxicillin added to her allergy list.       Other Visit Diagnoses    Encounter for screening mammogram for malignant neoplasm of breast       Relevant Orders   MM 3D SCREEN BREAST BILATERAL    The patient knows to call to schedule her mammogram.   Return in about 1 month (around 04/14/2021) for Nurse BP check, 4 months PCP.  This visit occurred during the SARS-CoV-2 public health emergency.  Safety protocols were in place, including screening questions prior to the visit, additional usage of staff PPE, and extensive cleaning of exam room while observing appropriate contact time as indicated for disinfecting solutions.    Marikay Alar, MD [facility] Primary Care [facility]

## 2021-03-14 NOTE — Assessment & Plan Note (Signed)
Well-controlled.  She will continue Lipitor 80 mg once daily.

## 2021-03-14 NOTE — Assessment & Plan Note (Signed)
Rate controlled on no medications.  Discussed continuing Eliquis 5 mg twice daily.  She will continue to see cardiology as well.

## 2021-03-14 NOTE — Assessment & Plan Note (Addendum)
I discussed that her current blood pressure is well controlled.  She wants to come off of the amlodipine and I advised this is reasonable as long as her blood pressure stays less than 130/80.  If it rises above 130/80 she will restart the amlodipine.  She will continue on losartan 25 mg daily.  She will return for nurse BP check in 1 month.

## 2021-03-20 DIAGNOSIS — J301 Allergic rhinitis due to pollen: Secondary | ICD-10-CM | POA: Diagnosis not present

## 2021-03-21 ENCOUNTER — Other Ambulatory Visit: Payer: Self-pay | Admitting: Family Medicine

## 2021-03-27 DIAGNOSIS — J301 Allergic rhinitis due to pollen: Secondary | ICD-10-CM | POA: Diagnosis not present

## 2021-04-06 ENCOUNTER — Ambulatory Visit: Payer: Medicare HMO | Admitting: Family Medicine

## 2021-04-09 DIAGNOSIS — J301 Allergic rhinitis due to pollen: Secondary | ICD-10-CM | POA: Diagnosis not present

## 2021-04-10 ENCOUNTER — Other Ambulatory Visit: Payer: Self-pay

## 2021-04-10 ENCOUNTER — Ambulatory Visit
Admission: EM | Admit: 2021-04-10 | Discharge: 2021-04-10 | Disposition: A | Discharge status: Home / Self Care | Payer: Medicare HMO | Attending: Family Medicine | Admitting: Family Medicine

## 2021-04-10 ENCOUNTER — Encounter: Payer: Self-pay | Admitting: Emergency Medicine

## 2021-04-10 ENCOUNTER — Telehealth: Payer: Self-pay

## 2021-04-10 DIAGNOSIS — M1712 Unilateral primary osteoarthritis, left knee: Secondary | ICD-10-CM

## 2021-04-10 MED ORDER — PREDNISONE 10 MG (21) PO TBPK: ORAL_TABLET | ORAL | 0 refills | Status: DC

## 2021-04-10 MED ORDER — LOSARTAN POTASSIUM 25 MG PO TABS: 25.0000 mg | ORAL_TABLET | Freq: Every day | ORAL | 1 refills | Status: DC

## 2021-04-10 NOTE — Discharge Instructions (Addendum)
Rest, ice, elevation.  Medication as prescribed.  Follow up with Orthopedics.  Take care  Dr. Adriana Simas

## 2021-04-10 NOTE — ED Triage Notes (Signed)
Pt c/o left knee pain. She states she has had chronic pain for a while but last night it woke her up this morning and when she tried to stand she could not put weight on it. No new injuries.

## 2021-04-10 NOTE — Telephone Encounter (Signed)
PT is out of LOSARTAN and she states that she needs 25mg  -NOT 50. She goes to [facility]. Please send asap

## 2021-04-10 NOTE — Addendum Note (Signed)
Addended byElise Benne T on: 04/10/2021 04:33 PM   Modules accepted: Orders

## 2021-04-10 NOTE — ED Provider Notes (Signed)
[facility]    CSN: [id number] Arrival date & time: 04/10/21  1019      History   Chief Complaint Chief Complaint  Patient presents with   Knee Pain    left   HPI  75 year old female presents with knee pain.  Patient reports that this has been going on for a few months.  Now worsening.  She reports left knee pain.  Difficulty ambulating secondary to pain.  She has a history of osteoarthritis.  She has had a right knee replacement.  No recent fall, trauma, injury.  She has been taking Tylenol without resolution.  She states that it does not help her pain.  Pain currently 4/10 in severity.  Worse with ambulation.  No other complaints.   Past Medical History:  Diagnosis Date   Anemia    distant past   Anxiety    Arthritis    "everywhere" - big toes worst   Chronic atrial fibrillation (HCC)    a. on eliquis; b. 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   Seasonal allergies    takes allergy weekly   Status post bilateral breast reduction 09/06/2016   Overview:  08/29/16    Patient Active Problem List   Diagnosis Date Noted   Rash 03/14/2021   HLD (hyperlipidemia) 10/03/2020   TIA (transient ischemic attack) 09/19/2020   Obesity (BMI 30-39.9) 09/19/2020   CVA (cerebral vascular accident) (HCC) 09/19/2020   GERD (gastroesophageal reflux disease)    Acute right ankle pain 07/27/2019   Prediabetes 07/26/2019   Rib pain on right side 11/03/2018   Headache 11/03/2018   Diverticulosis of large intestine without diverticulitis    Lower GI bleed 10/23/2018   Night sweats 04/20/2018   Morbid obesity (HCC) 10/11/2017   Degenerative arthritis of right knee 07/17/2017   Allergic rhinitis 09/02/2016   Skin lesion 09/02/2016   Change of skin  color 08/06/2016   Injury of right rotator cuff 02/15/2016   Chronic atrial fibrillation (HCC) 09/11/2015   Sleep apnea 08/30/2015   Depression 06/21/2015   Chronic insomnia 05/30/2015   Anxiety 05/27/2014   Hypertension 05/27/2014   Cataracts, bilateral 05/25/2014   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   Macular hole of left eye 05/24/2014   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    Past Surgical History:  Procedure Laterality Date   BREAST REDUCTION SURGERY Bilateral 08/29/2016   Procedure: BILATERAL MAMMARY REDUCTION  (BREAST)WITH LIPOSUCTION;  Surgeon: Peggye Form, DO;  Location: [facility];  Service: Plastics;  Laterality: Bilateral;   CATARACT EXTRACTION W/PHACO Left 05/03/2015   Procedure: CATARACT EXTRACTION PHACO AND INTRAOCULAR LENS PLACEMENT (IOC);  Surgeon: Lockie Mola, MD;  Location: [facility];  Service: Ophthalmology;  Laterality: Left;  CPAP   CATARACT EXTRACTION W/PHACO Right 10/09/2016   Procedure: CATARACT EXTRACTION PHACO AND INTRAOCULAR LENS PLACEMENT (IOC);  Surgeon: Lockie Mola, MD;  Location: [facility];  Service: Ophthalmology;  Laterality: Right;  sleep apnea   CHONDROPLASTY Right 04/29/2016   Procedure: CHONDROPLASTY;  Surgeon: Donato Heinz, MD;  Location:  [facility];  Service: Orthopedics;  Laterality: Right;   COLONOSCOPY WITH PROPOFOL N/A 10/24/2018   Procedure: COLONOSCOPY WITH PROPOFOL;  Surgeon: Pasty Spillersahiliani, Varnita B, MD;  Location: [facility];  Service: Endoscopy;  Laterality: N/A;   EYE SURGERY     KNEE ARTHROPLASTY Right 03/25/2018   Procedure: COMPUTER ASSISTED TOTAL KNEE ARTHROPLASTY;  Surgeon: Donato HeinzHooten, James P, MD;  Location: [facility];  Service: Orthopedics;  Laterality: Right;   KNEE ARTHROSCOPY WITH LATERAL MENISECTOMY  04/29/2016   Procedure: KNEE ARTHROSCOPY WITH LATERAL MENISECTOMY;  Surgeon: Donato HeinzJames P Hooten,  MD;  Location: [facility];  Service: Orthopedics;;   KNEE ARTHROSCOPY WITH MEDIAL MENISECTOMY  04/29/2016   Procedure: KNEE ARTHROSCOPY WITH MEDIAL MENISECTOMY;  Surgeon: Donato HeinzJames P Hooten, MD;  Location: [facility];  Service: Orthopedics;;   REDUCTION MAMMAPLASTY Bilateral 09/2016   RETINAL DETACHMENT SURGERY Left May 03, 2015   [facility]   TUBAL LIGATION      OB History   No obstetric history on file.      Home Medications    Prior to Admission medications   Medication Sig Start Date End Date Taking? Authorizing Provider  apixaban (ELIQUIS) 5 MG TABS tablet Take 1 tablet (5 mg total) by mouth 2 (two) times daily. 11/23/20  Yes Iran OuchArida, Muhammad A, MD  atorvastatin (LIPITOR) 80 MG tablet Take 1 tablet (80 mg total) by mouth at bedtime. 09/20/20 04/10/21 Yes Pokhrel, Laxman, MD  B Complex-C (B-COMPLEX WITH VITAMIN C) tablet Take 1 tablet by mouth daily.   Yes [provider]  cetirizine (ZYRTEC) 10 MG tablet Take 1 tablet (10 mg total) by mouth daily. 01/22/19  Yes Glori LuisSonnenberg, Eric G, MD  EPIPEN 2-PAK 0.3 MG/0.3ML SOAJ injection Inject 0.3 mLs (0.3 mg total) into the muscle as directed. AS NEEDED FOR ANAPHYLAXIS 01/22/19  Yes Glori LuisSonnenberg, Eric G, MD  fluticasone [facility](FLONASE) 50 MCG/ACT nasal spray Place 2 sprays into both nostrils daily. 01/22/19  Yes Glori LuisSonnenberg, Eric G, MD  GLUCOSAMINE-CHONDROITIN DS PO Take 3,000 mg by mouth daily.   Yes [provider]  losartan (COZAAR) 50 MG tablet Take 25 mg by mouth daily.   Yes [provider]  Magnesium 250 MG TABS Take 250 mg by mouth daily.   Yes [provider]  montelukast (SINGULAIR) 10 MG tablet Take 10 mg by mouth at bedtime.  08/20/19  Yes [provider]  Multiple Vitamin (MULTIVITAMIN WITH MINERALS) TABS tablet Take 1 tablet by mouth daily. One-A-Day Active 65+   Yes [provider]  predniSONE (STERAPRED UNI-PAK 21 TAB) 10 MG (21) TBPK tablet 6 tablets on day 1; decrease by 1 tablet daily  until gone. 04/10/21  Yes Maylynn Orzechowski G, DO  sertraline (ZOLOFT) 50 MG tablet TAKE 1 TABLET BY MOUTH DAILY 03/21/21  Yes Glori LuisSonnenberg, Eric G, MD  azelastine (OPTIVAR) 0.05 % ophthalmic solution Place 1 drop into both eyes 2 (two) times daily.    [provider]    Family History Family History  Problem Relation Age of Onset   Alcoholism Other    Heart disease Other    Breast cancer Neg Hx     Social History Social History   Tobacco Use   Smoking status: Former    Packs/day: 0.25    Years: 2.00    Pack years: 0.50    Types: Cigarettes    Quit date: 10/21/1969    Years since quitting: 51.5   Smokeless tobacco: Never  Vaping Use   Vaping Use: Never used  Substance Use Topics   Alcohol use: Not Currently    Comment: occasional glass of wine   Drug use: No     Allergies   Ivp dye [iodinated diagnostic agents], Iodine, Tape, and Amoxicillin   Review of Systems Review of Systems Per HPI  Physical Exam Triage Vital Signs ED Triage Vitals  Enc Vitals Group     BP 04/10/21 1107 (!) 143/83     Pulse Rate 04/10/21 1107 76     Resp 04/10/21 1107 18     Temp 04/10/21 1107 98.6 F (37 C)     Temp Source 04/10/21 1107 Oral     SpO2 04/10/21 1107 95 %     Weight 04/10/21 1104 237 lb 3.4 oz (107.6 kg)     Height 04/10/21 1104 5\' 5"  (1.651 m)     Head Circumference --      Peak Flow --      Pain Score 04/10/21 1103 4     Pain Loc --      Pain Edu? --      Excl. in GC? --    Updated Vital Signs BP (!) 143/83 (BP Location: Left Arm)   Pulse 76   Temp 98.6 F (37 C) (Oral)   Resp 18   Ht 5\' 5"  (1.651 m)   Wt 107.6 kg   SpO2 95%   BMI 39.47 kg/m   Visual Acuity Right Eye Distance:   Left Eye Distance:   Bilateral Distance:    Right Eye Near:   Left Eye Near:    Bilateral Near:     Physical Exam Vitals and nursing note reviewed.  Constitutional:      General: She is not in acute distress.    Appearance: Normal appearance. She is obese. She is not  ill-appearing.  HENT:     Head: Normocephalic and atraumatic.  Eyes:     General:        Right eye: No discharge.        Left eye: No discharge.     Conjunctiva/sclera: Conjunctivae normal.  Pulmonary:     Effort: Pulmonary effort is normal. No respiratory distress.  Musculoskeletal:     Comments: Left knee -anterior joint line tenderness.  Mild swelling.  Neurological:     Mental Status: She is alert.  Psychiatric:        Mood and Affect: Mood normal.        Behavior: Behavior normal.     UC Treatments / Results  Labs (all labs ordered are listed, but only abnormal results are displayed) Labs Reviewed - No data to display  EKG   Radiology No results found.  Procedures Procedures (including critical care time)  Medications Ordered in UC Medications - No data to display  Initial Impression / Assessment and Plan / UC Course  I have reviewed the triage vital signs and the nursing notes.  Pertinent labs & imaging results that were available during my care of the patient were reviewed by me and considered in my medical decision making (see chart for details).    75 year old female presents with worsening knee pain.  This appears to be a chronic issue which has acutely worsened.  I suspect that this is secondary to osteoarthritis.  Prednisone as prescribed.  Follow-up with orthopedics.  Final Clinical Impressions(s) / UC Diagnoses   Final diagnoses:  Primary osteoarthritis of left knee     Discharge Instructions      Rest, ice, elevation.  Medication as  prescribed.  Follow up with Orthopedics.  Take care  Dr. Adriana Simas    ED Prescriptions     Medication Sig Dispense Auth. Provider   predniSONE (STERAPRED UNI-PAK 21 TAB) 10 MG (21) TBPK tablet 6 tablets on day 1; decrease by 1 tablet daily until gone. 21 tablet Everlene Other G, DO      PDMP not reviewed this encounter.   Tommie Sams, [place] 04/10/21 1311

## 2021-04-13 DIAGNOSIS — M1712 Unilateral primary osteoarthritis, left knee: Secondary | ICD-10-CM | POA: Diagnosis not present

## 2021-04-17 ENCOUNTER — Other Ambulatory Visit: Payer: Self-pay

## 2021-04-17 ENCOUNTER — Ambulatory Visit: Payer: Medicare HMO

## 2021-04-17 ENCOUNTER — Ambulatory Visit (INDEPENDENT_AMBULATORY_CARE_PROVIDER_SITE_OTHER): Payer: Medicare HMO

## 2021-04-17 VITALS — BP 115/72 | HR 74

## 2021-04-17 DIAGNOSIS — I10 Essential (primary) hypertension: Secondary | ICD-10-CM

## 2021-04-17 NOTE — Progress Notes (Signed)
Patient is here for a BP check due to bp being high at last visit, as per patient.  Currently patients BP is 115/72 and BPM is 74.  Patient has no complaints of headaches, blurry vision, chest pain, arm pain, light headedness, dizziness, and nor jaw pain. Please see previous note for order.

## 2021-04-24 DIAGNOSIS — J301 Allergic rhinitis due to pollen: Secondary | ICD-10-CM | POA: Diagnosis not present

## 2021-05-01 DIAGNOSIS — J301 Allergic rhinitis due to pollen: Secondary | ICD-10-CM | POA: Diagnosis not present

## 2021-06-01 ENCOUNTER — Encounter: Payer: Self-pay | Admitting: Family Medicine

## 2021-06-11 DIAGNOSIS — G4733 Obstructive sleep apnea (adult) (pediatric): Secondary | ICD-10-CM | POA: Diagnosis not present

## 2021-06-13 DIAGNOSIS — G4733 Obstructive sleep apnea (adult) (pediatric): Secondary | ICD-10-CM | POA: Diagnosis not present

## 2021-06-26 ENCOUNTER — Ambulatory Visit
Admission: RE | Admit: 2021-06-26 | Discharge: 2021-06-26 | Disposition: A | Discharge status: Home / Self Care | Payer: Medicare HMO | Source: Ambulatory Visit | Attending: Family Medicine | Admitting: Family Medicine

## 2021-06-26 ENCOUNTER — Other Ambulatory Visit: Payer: Self-pay

## 2021-06-26 DIAGNOSIS — Z1231 Encounter for screening mammogram for malignant neoplasm of breast: Secondary | ICD-10-CM | POA: Diagnosis not present

## 2021-07-17 ENCOUNTER — Encounter: Payer: Self-pay | Admitting: Family Medicine

## 2021-07-17 ENCOUNTER — Ambulatory Visit (INDEPENDENT_AMBULATORY_CARE_PROVIDER_SITE_OTHER): Payer: Medicare HMO | Admitting: Family Medicine

## 2021-07-17 ENCOUNTER — Other Ambulatory Visit: Payer: Self-pay

## 2021-07-17 VITALS — BP 130/80 | HR 81 | Temp 98.1°F | Ht 65.0 in | Wt 254.0 lb

## 2021-07-17 DIAGNOSIS — F5104 Psychophysiologic insomnia: Secondary | ICD-10-CM | POA: Diagnosis not present

## 2021-07-17 DIAGNOSIS — I10 Essential (primary) hypertension: Secondary | ICD-10-CM | POA: Diagnosis not present

## 2021-07-17 DIAGNOSIS — R7303 Prediabetes: Secondary | ICD-10-CM | POA: Diagnosis not present

## 2021-07-17 DIAGNOSIS — B372 Candidiasis of skin and nail: Secondary | ICD-10-CM | POA: Insufficient documentation

## 2021-07-17 DIAGNOSIS — G4730 Sleep apnea, unspecified: Secondary | ICD-10-CM | POA: Diagnosis not present

## 2021-07-17 DIAGNOSIS — E669 Obesity, unspecified: Secondary | ICD-10-CM | POA: Diagnosis not present

## 2021-07-17 DIAGNOSIS — Z23 Encounter for immunization: Secondary | ICD-10-CM

## 2021-07-17 DIAGNOSIS — R632 Polyphagia: Secondary | ICD-10-CM | POA: Diagnosis not present

## 2021-07-17 LAB — HEMOGLOBIN A1C: Hgb A1c MFr Bld: 5.8 % (ref 4.6–6.5)

## 2021-07-17 LAB — BASIC METABOLIC PANEL
BUN: 15 mg/dL (ref 6–23)
CO2: 31 mEq/L (ref 19–32)
Calcium: 9.2 mg/dL (ref 8.4–10.5)
Chloride: 104 mEq/L (ref 96–112)
Creatinine, Ser: 0.88 mg/dL (ref 0.40–1.20)
GFR: 64.95 mL/min (ref >60.00)
Glucose, Bld: 94 mg/dL (ref 70–99)
Potassium: 4.7 mEq/L (ref 3.5–5.1)
Sodium: 140 mEq/L (ref 135–145)

## 2021-07-17 MED ORDER — TRAZODONE HCL 50 MG PO TABS: 25.0000 mg | ORAL_TABLET | Freq: Every evening | ORAL | 3 refills | Status: DC | PRN

## 2021-07-17 MED ORDER — NYSTATIN 100000 UNIT/GM EX CREA: 1.0000 "application " | TOPICAL_CREAM | Freq: Two times a day (BID) | CUTANEOUS | 0 refills | Status: AC

## 2021-07-17 NOTE — Assessment & Plan Note (Signed)
Check A1c. 

## 2021-07-17 NOTE — Patient Instructions (Signed)
Nice to see you. Please try the trazodone for your sleep. We will try to get you into see a therapist to see if they can help with your eating. We will get lab work today. Please try the topical cream for your groin itching.

## 2021-07-17 NOTE — Progress Notes (Signed)
Marikay Alar, MD Phone: [phone]  Crystal Haas is a 75 y.o. female who presents today for f/u.  HYPERTENSION Disease Monitoring Home BP Monitoring similar Chest pain- no    Dyspnea- no Medications Compliance-  taking losartan.   Edema- no BMET    Component Value Date/Time   NA 139 09/19/2020 1030   NA 146 (H) 09/04/2015 1051   NA 143 05/10/2014 1847   K 4.4 09/19/2020 1030   K 3.8 05/10/2014 1847   CL 100 09/19/2020 1030   CL 108 (H) 05/10/2014 1847   CO2 27 09/19/2020 1030   CO2 29 05/10/2014 1847   GLUCOSE 116 (H) 09/19/2020 1030   GLUCOSE 119 (H) 05/10/2014 1847   BUN 8 09/19/2020 1030   BUN 15 09/04/2015 1051   BUN 12 05/10/2014 1847   CREATININE 0.97 09/19/2020 1030   CREATININE 0.91 05/10/2014 1847   CALCIUM 9.4 09/19/2020 1030   CALCIUM 8.7 05/10/2014 1847   GFRNONAA >60 09/19/2020 1030   GFRNONAA >60 05/10/2014 1847   GFRAA >60 04/28/2020 1217   GFRAA >60 05/10/2014 1847   OSA: Patient reports wearing her CPAP though notes it leaks a lot.  She has tried multiple different masks with no benefit.  She does note some hypersomnia.  She does not wake up well rested.  She reports having seen a sleep specialist in the past.  Insomnia: This is a chronic issue.  She was on Ambien previously.  She felt as though she was getting addicted.  She does report trying trazodone in the past though does not remember how she tolerated this.  Prediabetes: Due for an A1c.  She has no self-control regarding her diet.  It sounds that she binge eats frequently and is never full.  Diet meds were not helpful in the past.  She walks 20 minutes 1 time a week.  Candidal intertrigo: Patient notes she has to wear a diaper when she dog sits.  She ended up getting a rash and itching in her inguinal and vaginal area.  She has been using athlete's foot powder that has helped significantly.  The rash has resolved though she does still have some itching.  Social History   Tobacco Use   Smoking Status Former   Packs/day: 0.25   Years: 2.00   Pack years: 0.50   Types: Cigarettes   Quit date: 10/21/1969   Years since quitting: 51.7  Smokeless Tobacco Never    Current Outpatient Medications on File Prior to Visit  Medication Sig Dispense Refill   apixaban (ELIQUIS) 5 MG TABS tablet Take 1 tablet (5 mg total) by mouth 2 (two) times daily. 180 tablet 1   azelastine (OPTIVAR) 0.05 % ophthalmic solution Place 1 drop into both eyes 2 (two) times daily.     B Complex-C (B-COMPLEX WITH VITAMIN C) tablet Take 1 tablet by mouth daily.     fluticasone (FLONASE) 50 MCG/ACT nasal spray Place 2 sprays into both nostrils daily. 16 g 0   GLUCOSAMINE-CHONDROITIN DS PO Take 3,000 mg by mouth daily.     losartan (COZAAR) 25 MG tablet Take 1 tablet (25 mg total) by mouth daily. 90 tablet 1   Magnesium 250 MG TABS Take 250 mg by mouth daily.     montelukast (SINGULAIR) 10 MG tablet Take 10 mg by mouth at bedtime.      Multiple Vitamin (MULTIVITAMIN WITH MINERALS) TABS tablet Take 1 tablet by mouth daily. One-A-Day Active 65+     sertraline (ZOLOFT) 50 MG tablet  TAKE 1 TABLET BY MOUTH DAILY 90 tablet 1   atorvastatin (LIPITOR) 80 MG tablet Take 1 tablet (80 mg total) by mouth at bedtime. 90 tablet 0   EPIPEN 2-PAK 0.3 MG/0.3ML SOAJ injection Inject 0.3 mLs (0.3 mg total) into the muscle as directed. AS NEEDED FOR ANAPHYLAXIS (Patient not taking: Reported on 07/17/2021) 2 Device 0   No current facility-administered medications on file prior to visit.     ROS see history of present illness  Objective  Physical Exam Vitals:   07/17/21 1112  BP: 130/80  Pulse: 81  Temp: 98.1 F (36.7 C)  SpO2: 99%    BP Readings from Last 3 Encounters:  07/17/21 130/80  04/17/21 115/72  04/10/21 (!) 143/83   Wt Readings from Last 3 Encounters:  07/17/21 254 lb (115.2 kg)  04/10/21 237 lb 3.4 oz (107.6 kg)  03/14/21 237 lb 3.2 oz (107.6 kg)    Physical Exam Constitutional:      General:  She is not in acute distress.    Appearance: She is not diaphoretic.  Cardiovascular:     Rate and Rhythm: Normal rate and regular rhythm.     Heart sounds: Normal heart sounds.  Pulmonary:     Effort: Pulmonary effort is normal.     Breath sounds: Normal breath sounds.  Genitourinary:    Comments: Charlyne Mom, CMA served as chaperone, there is no rash in her inguinal or vaginal area, there is evidence of powder Skin:    General: Skin is warm and dry.  Neurological:     Mental Status: She is alert.     Assessment/Plan: Please see individual problem list.  Problem List Items Addressed This Visit     Binge eating    Concern for binge eating disorder.  We will see about therapy to evaluate and treat this further.      Relevant Orders   Ambulatory referral to Psychology   Candidal intertrigo    Much improved with over-the-counter treatment.  We will start her on nystatin cream to see if this will fully resolve the issue.      Relevant Medications   nystatin cream (MYCOSTATIN)   Chronic insomnia    We will trial trazodone on for sleep.      Relevant Medications   traZODone (DESYREL) 50 MG tablet   Hypertension - Primary    Adequate control.  She will continue losartan 25 mg once daily.  Check BMP.      Relevant Orders   Basic Metabolic Panel (BMET)   Obesity (BMI 30-39.9)    Seems as though she potentially could have binge eating disorder.  Discussed the potential for seeing a therapist to help with this.  We will place a referral.      Prediabetes    Check A1c.      Relevant Orders   HgB A1c   Sleep apnea    It seems as though she continues to have issues with her CPAP mask.  She has tried multiple masks.  I did advise referral to a sleep specialist and she will think about this.  She will let us know if she would like that referral.  She will continue to use her CPAP.      Other Visit Diagnoses     Need for immunization against influenza       Relevant  Orders   Flu Vaccine QUAD High Dose(Fluad) (Completed)       Return in about 6 months (around 01/14/2022).  This visit occurred during the SARS-CoV-2 public health emergency.  Safety protocols were in place, including screening questions prior to the visit, additional usage of staff PPE, and extensive cleaning of exam room while observing appropriate contact time as indicated for disinfecting solutions.    Marikay Alar, MD [facility]

## 2021-07-17 NOTE — Assessment & Plan Note (Signed)
Adequate control.  She will continue losartan 25 mg once daily.  Check BMP.

## 2021-07-17 NOTE — Assessment & Plan Note (Addendum)
It seems as though she continues to have issues with her CPAP mask.  She has tried multiple masks.  I did advise referral to a sleep specialist and she will think about this.  She will let us know if she would like that referral.  She will continue to use her CPAP.

## 2021-07-17 NOTE — Assessment & Plan Note (Signed)
Seems as though she potentially could have binge eating disorder.  Discussed the potential for seeing a therapist to help with this.  We will place a referral.

## 2021-07-17 NOTE — Assessment & Plan Note (Signed)
We will trial trazodone on for sleep.

## 2021-07-17 NOTE — Assessment & Plan Note (Signed)
Concern for binge eating disorder.  We will see about therapy to evaluate and treat this further.

## 2021-07-17 NOTE — Assessment & Plan Note (Signed)
Much improved with over-the-counter treatment.  We will start her on nystatin cream to see if this will fully resolve the issue.

## 2021-08-08 ENCOUNTER — Other Ambulatory Visit: Payer: Self-pay | Admitting: Family Medicine

## 2021-08-08 DIAGNOSIS — F5104 Psychophysiologic insomnia: Secondary | ICD-10-CM

## 2021-08-22 ENCOUNTER — Other Ambulatory Visit: Payer: Self-pay | Admitting: Cardiovascular Disease

## 2021-08-22 DIAGNOSIS — Z7901 Long term (current) use of anticoagulants: Secondary | ICD-10-CM

## 2021-08-22 DIAGNOSIS — I482 Chronic atrial fibrillation, unspecified: Secondary | ICD-10-CM

## 2021-08-22 NOTE — Telephone Encounter (Signed)
Eliquis 5 mg refill request received. Patient is 75 years old, weight-115.2 kg, Crea- 0.88 on 07/17/21, Diagnosis-afib, and last seen by Dr. Kirke Corin on 02/06/21. Dose is appropriate based on dosing criteria. Will send in refill to requested pharmacy.

## 2021-09-02 ENCOUNTER — Other Ambulatory Visit: Payer: Self-pay | Admitting: Family Medicine

## 2021-10-03 ENCOUNTER — Telehealth: Payer: Self-pay | Admitting: Family Medicine

## 2021-10-03 NOTE — Telephone Encounter (Signed)
I spoke with pt regarding referral to [facility] pt stated she is moving to [place] at the end of January. I asked if she still wanted me to send the referral into [facility] pt stated no.   Please advise and Thank you!

## 2021-10-03 NOTE — Telephone Encounter (Signed)
Noted.  That is fine.  You can cancel the referral.

## 2021-10-03 NOTE — Addendum Note (Signed)
Addended by: Birdie Sons, Ileene Allie G on: 10/03/2021 12:18 PM   Modules accepted: Orders

## 2021-10-12 ENCOUNTER — Telehealth: Payer: Self-pay | Admitting: Family Medicine

## 2021-10-12 ENCOUNTER — Other Ambulatory Visit: Payer: Self-pay

## 2021-10-12 DIAGNOSIS — J309 Allergic rhinitis, unspecified: Secondary | ICD-10-CM

## 2021-10-12 DIAGNOSIS — I10 Essential (primary) hypertension: Secondary | ICD-10-CM

## 2021-10-12 MED ORDER — MONTELUKAST SODIUM 10 MG PO TABS: 10.0000 mg | ORAL_TABLET | Freq: Every day | ORAL | 1 refills | Status: DC

## 2021-10-12 MED ORDER — LOSARTAN POTASSIUM 25 MG PO TABS: 25.0000 mg | ORAL_TABLET | Freq: Every day | ORAL | 1 refills | Status: DC

## 2021-10-12 NOTE — Telephone Encounter (Signed)
Pt called in requesting 1 Month refill on medication (losartan (COZAAR) 25 MG tablet) and (montelukast (SINGULAIR) 10 MG tablet). Pt stated that she decided to stay in [place]. Pt stated she is in [place] right now because her son is having surgery. Pt requesting callback with confirmation that script was sent over

## 2021-10-12 NOTE — Telephone Encounter (Signed)
Noted. Thanks.

## 2021-10-12 NOTE — Telephone Encounter (Signed)
I spoke with pt she is no longer moving to [place] and wanted to proceed with the referral. I gave pt the number to call and sch. No referral is needed. Thank you

## 2021-10-12 NOTE — Telephone Encounter (Signed)
I called and inforemd the patient that I would send the medication to [facility] but they did have power outages and she stated she has enough for a few days.  Derita Michelsen,cma

## 2021-10-16 ENCOUNTER — Other Ambulatory Visit: Payer: Self-pay

## 2021-10-16 DIAGNOSIS — J309 Allergic rhinitis, unspecified: Secondary | ICD-10-CM

## 2021-10-16 DIAGNOSIS — I10 Essential (primary) hypertension: Secondary | ICD-10-CM

## 2021-10-16 MED ORDER — LOSARTAN POTASSIUM 25 MG PO TABS: 25.0000 mg | ORAL_TABLET | Freq: Every day | ORAL | 0 refills | Status: DC

## 2021-10-16 MED ORDER — MONTELUKAST SODIUM 10 MG PO TABS: 10.0000 mg | ORAL_TABLET | Freq: Every day | ORAL | 0 refills | Status: DC

## 2021-10-16 NOTE — Telephone Encounter (Signed)
Patient called in today requesting   1 Month refill on medication (losartan (COZAAR) 25 MG tablet) and (montelukast (SINGULAIR) 10 MG tablet). Zoloft sertraline 50 mg. Patient needs to go [facility] [address] [place], [place] # [phone]. Patient advise completely out of all these meds.  Patient can be reached at [phone]

## 2021-10-16 NOTE — Telephone Encounter (Signed)
I call the patient and inforemd her that I sent her [facility] to [facility] in [place] and she understood.  Rebakah Cokley,cma

## 2021-10-29 DIAGNOSIS — R9431 Abnormal electrocardiogram [ECG] [EKG]: Secondary | ICD-10-CM | POA: Diagnosis not present

## 2021-10-29 DIAGNOSIS — Z7901 Long term (current) use of anticoagulants: Secondary | ICD-10-CM | POA: Diagnosis not present

## 2021-10-29 DIAGNOSIS — I4891 Unspecified atrial fibrillation: Secondary | ICD-10-CM | POA: Diagnosis not present

## 2021-10-29 DIAGNOSIS — I10 Essential (primary) hypertension: Secondary | ICD-10-CM | POA: Diagnosis not present

## 2021-10-29 DIAGNOSIS — Z9851 Tubal ligation status: Secondary | ICD-10-CM | POA: Diagnosis not present

## 2021-10-29 DIAGNOSIS — R42 Dizziness and giddiness: Secondary | ICD-10-CM | POA: Diagnosis not present

## 2021-10-29 DIAGNOSIS — Z8673 Personal history of transient ischemic attack (TIA), and cerebral infarction without residual deficits: Secondary | ICD-10-CM | POA: Diagnosis not present

## 2021-10-29 DIAGNOSIS — R0600 Dyspnea, unspecified: Secondary | ICD-10-CM | POA: Diagnosis not present

## 2021-10-29 DIAGNOSIS — Z96651 Presence of right artificial knee joint: Secondary | ICD-10-CM | POA: Diagnosis not present

## 2021-10-29 DIAGNOSIS — Z79899 Other long term (current) drug therapy: Secondary | ICD-10-CM | POA: Diagnosis not present

## 2021-11-01 ENCOUNTER — Ambulatory Visit: Payer: Medicare HMO | Admitting: Adult Health

## 2021-11-02 ENCOUNTER — Encounter: Payer: Self-pay | Admitting: Internal Medicine

## 2021-11-02 ENCOUNTER — Telehealth: Payer: Self-pay | Admitting: Cardiovascular Disease

## 2021-11-02 ENCOUNTER — Ambulatory Visit (INDEPENDENT_AMBULATORY_CARE_PROVIDER_SITE_OTHER): Payer: Medicare HMO

## 2021-11-02 ENCOUNTER — Ambulatory Visit (INDEPENDENT_AMBULATORY_CARE_PROVIDER_SITE_OTHER): Payer: Medicare HMO | Admitting: Internal Medicine

## 2021-11-02 ENCOUNTER — Other Ambulatory Visit: Payer: Self-pay

## 2021-11-02 DIAGNOSIS — I10 Essential (primary) hypertension: Secondary | ICD-10-CM | POA: Diagnosis not present

## 2021-11-02 DIAGNOSIS — R944 Abnormal results of kidney function studies: Secondary | ICD-10-CM | POA: Diagnosis not present

## 2021-11-02 DIAGNOSIS — R42 Dizziness and giddiness: Secondary | ICD-10-CM

## 2021-11-02 DIAGNOSIS — E782 Mixed hyperlipidemia: Secondary | ICD-10-CM | POA: Diagnosis not present

## 2021-11-02 DIAGNOSIS — G458 Other transient cerebral ischemic attacks and related syndromes: Secondary | ICD-10-CM

## 2021-11-02 DIAGNOSIS — I6523 Occlusion and stenosis of bilateral carotid arteries: Secondary | ICD-10-CM | POA: Diagnosis not present

## 2021-11-02 DIAGNOSIS — I482 Chronic atrial fibrillation, unspecified: Secondary | ICD-10-CM

## 2021-11-02 LAB — COMPREHENSIVE METABOLIC PANEL
ALT: 16 U/L (ref 0–35)
AST: 18 U/L (ref 0–37)
Albumin: 4.2 g/dL (ref 3.5–5.2)
Alkaline Phosphatase: 54 U/L (ref 39–117)
BUN: 14 mg/dL (ref 6–23)
CO2: 29 mEq/L (ref 19–32)
Calcium: 9.3 mg/dL (ref 8.4–10.5)
Chloride: 104 mEq/L (ref 96–112)
Creatinine, Ser: 0.94 mg/dL (ref 0.40–1.20)
GFR: 59.88 mL/min — ABNORMAL LOW (ref >60.00)
Glucose, Bld: 99 mg/dL (ref 70–99)
Potassium: 4.4 mEq/L (ref 3.5–5.1)
Sodium: 141 mEq/L (ref 135–145)
Total Bilirubin: 0.5 mg/dL (ref 0.2–1.2)
Total Protein: 6.6 g/dL (ref 6.0–8.3)

## 2021-11-02 MED ORDER — AMLODIPINE BESYLATE 2.5 MG PO TABS: 2.5000 mg | ORAL_TABLET | Freq: Every day | ORAL | 1 refills | Status: DC

## 2021-11-02 MED ORDER — ATORVASTATIN CALCIUM 80 MG PO TABS: 80.0000 mg | ORAL_TABLET | Freq: Every day | ORAL | 1 refills | Status: DC

## 2021-11-02 NOTE — Progress Notes (Addendum)
Subjective:  Patient ID: Crystal Haas, female    DOB: Dec 09, 1945  Age: 76 y.o. MRN: [id number]  CC: Diagnoses of Mixed hyperlipidemia, Primary hypertension, Light headedness, Carotid atherosclerosis, bilateral, Subclavian steal syndrome, and Decreased GFR were pertinent to this visit.   This visit occurred during the SARS-CoV-2 public health emergency.  Safety protocols were in place, including screening questions prior to the visit, additional usage of staff PPE, and extensive cleaning of exam room while observing appropriate contact time as indicated for disinfecting solutions.    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 .  Started   feeling light headed on Sunday .  Symptoms became more persistent on Jan 9,  called EMS.  EKG noted a fib (chronic) rate <100,  no ischemic changes,  but BP was 173/71 sitting and 196/93 standing so she went to ER in [place].  Denies chest pain shortness of breath.  One headache last week   CT Head was done,  no acute changes  labs including troponin were normal and EKG unrevealing.  Sent home with no medication change  after dizziness improved with Valium .   She has a History of intolerance to higher doses of losartan due to recurrent  dizziness.  Amlodipine stopped last year by Dr Caryl Bis, when BP improved.   losartan 25 mg continued.   December was stressful  due to son's health issues.  Also has not been sleeping well despite using CPAP on average 7 hours per night (per patient) .  She is careful to limit salt intake,   averages 2 cups of coffee daily in the morning at home.   She had been taking an OTC"ketogenic"  supplement for weight loss that contained beta hydroxybutyrate  and was advised by ER physician to stop the supplement.   She uses a Left wrist used BP cuff at home to measure BP . Notes that the arm cuff used in the ER was quite painful and BP was [id number] systolic in ER.  Review of readings show significant lability ; systolic has been as low as 94 in the last month and as high as 170.         Outpatient Medications Prior to Visit  Medication Sig Dispense Refill   apixaban (ELIQUIS) 5 MG TABS tablet TAKE 1 TABLET BY MOUTH TWICE A DAY 60 tablet 6   azelastine (OPTIVAR) 0.05 % ophthalmic solution Place 1 drop into both eyes 2 (two) times daily.     B Complex-C (B-COMPLEX WITH VITAMIN C) tablet Take 1 tablet by mouth daily.     EPIPEN 2-PAK 0.3 MG/0.3ML SOAJ injection Inject 0.3 mLs (0.3 mg total) into the muscle as directed. AS NEEDED FOR ANAPHYLAXIS 2 Device 0   fluticasone (FLONASE) 50 MCG/ACT nasal spray Place 2 sprays into both nostrils daily. 16 g 0   GLUCOSAMINE-CHONDROITIN DS PO Take 3,000 mg by mouth daily.     losartan (COZAAR) 25 MG tablet Take 1 tablet (25 mg total) by mouth daily. 90 tablet 0   Magnesium 250 MG TABS Take 250 mg by mouth daily.     montelukast (SINGULAIR) 10 MG  tablet Take 1 tablet (10 mg total) by mouth at bedtime. 90 tablet 0   Multiple Vitamin (MULTIVITAMIN WITH MINERALS) TABS tablet Take 1 tablet by mouth daily. One-A-Day Active 65+     nystatin cream (MYCOSTATIN) Apply 1 application topically 2 (two) times daily. 30 g 0   sertraline (ZOLOFT) 50 MG tablet TAKE 1 TABLET BY MOUTH EVERY DAY 90 tablet 1   traZODone (DESYREL) 50 MG tablet Take 0.5-1 tablets (25-50 mg total) by mouth at bedtime as needed for sleep. 30 tablet 3   atorvastatin (LIPITOR) 80 MG tablet Take 1 tablet (80 mg total) by mouth at bedtime. 90 tablet 0   No facility-administered medications prior to visit.    Review of Systems;  Patient denies headache, fevers, malaise, unintentional weight  loss, skin rash, eye pain, sinus congestion and sinus pain, sore throat, dysphagia,  hemoptysis , cough, dyspnea, wheezing, chest pain, palpitations, orthopnea, edema, abdominal pain, nausea, melena, diarrhea, constipation, flank pain, dysuria, hematuria, urinary  Frequency, nocturia, numbness, tingling, seizures,  Focal weakness, Loss of consciousness,  Tremor, insomnia, depression, anxiety, and suicidal ideation.      Objective:  BP (!) 170/96 (BP Location: Left Arm, Patient Position: Sitting, Cuff Size: Large)    Pulse 85    Temp (!) 97.5 F (36.4 C) (Oral)    Ht 5\' 5"  (1.651 m)    Wt 243 lb 6.4 oz (110.4 kg)    SpO2 92%    BMI 40.50 kg/m   BP Readings from Last 3 Encounters:  11/02/21 (!) 170/96  07/17/21 130/80  04/17/21 115/72    Wt Readings from Last 3 Encounters:  11/02/21 243 lb 6.4 oz (110.4 kg)  07/17/21 254 lb (115.2 kg)  04/10/21 237 lb 3.4 oz (107.6 kg)    General appearance: alert, cooperative and appears stated age Ears: normal TM's and external ear canals both ears Throat: lips, mucosa, and tongue normal; teeth and gums normal Neck: no adenopathy, no carotid bruit, supple, symmetrical, trachea midline and thyroid not enlarged, symmetric, no tenderness/mass/nodules Back: symmetric, no curvature. ROM normal. No CVA tenderness. Lungs: clear to auscultation bilaterally Heart: regular rate and rhythm, S1, S2 normal, no murmur, click, rub or gallop Abdomen: soft, non-tender; bowel sounds normal; no masses,  no organomegaly Pulses: 2+ and symmetric Skin: Skin color, texture, turgor normal. No rashes or lesions Lymph nodes: Cervical, supraclavicular, and axillary nodes normal.  Lab Results  Component Value Date   HGBA1C 5.8 07/17/2021   HGBA1C 5.7 (H) 09/20/2020   HGBA1C 6.0 07/26/2019    Lab Results  Component Value Date   CREATININE 0.94 11/02/2021   CREATININE 0.88 07/17/2021   CREATININE 0.97 09/19/2020    Lab Results  Component Value Date   WBC 7.7  09/19/2020   HGB 14.2 09/19/2020   HCT 42.9 09/19/2020   PLT 196 09/19/2020   GLUCOSE 99 11/02/2021   CHOL 94 09/20/2020   TRIG 90 09/20/2020   HDL 43 09/20/2020   LDLDIRECT 57.0 10/31/2020   LDLCALC 33 09/20/2020   ALT 16 11/02/2021   AST 18 11/02/2021   NA 141 11/02/2021   K 4.4 11/02/2021   CL 104 11/02/2021   CREATININE 0.94 11/02/2021   BUN 14 11/02/2021   CO2 29 11/02/2021   TSH 2.96 07/26/2019   INR 1.1 09/19/2020   HGBA1C 5.8 07/17/2021    MM 3D SCREEN BREAST BILATERAL  Result Date: 06/27/2021 CLINICAL DATA:  Screening. EXAM: DIGITAL SCREENING BILATERAL MAMMOGRAM WITH TOMOSYNTHESIS AND CAD TECHNIQUE: Bilateral screening  digital craniocaudal and mediolateral oblique mammograms were obtained. Bilateral screening digital breast tomosynthesis was performed. The images were evaluated with computer-aided detection. COMPARISON:  Previous exam(s). ACR Breast Density Category b: There are scattered areas of fibroglandular density. FINDINGS: There are no findings suspicious for malignancy. Post reduction changes. IMPRESSION: No mammographic evidence of malignancy. A result letter of this screening mammogram will be mailed directly to the patient. RECOMMENDATION: Screening mammogram in one year. (Code:SM-B-01Y) BI-RADS CATEGORY  2: Benign. Electronically Signed   By: Valentino Saxon M.D.   On: 06/27/2021 09:46   Assessment & Plan:   Problem List Items Addressed This Visit     Hypertension    reason for incre aseand chronicity of increase unclear.Diet pills may have played a role.  She has stopped these.  Advised to resume amlodipine starting at 2.5 mg daily. Continue losartan 25 mg daily .  Increase to 5 mg after one week for systolic > [id number]       Relevant Medications   amLODipine (NORVASC) 2.5 MG tablet   atorvastatin (LIPITOR) 80 MG tablet   Other Relevant Orders   Microalbumin / creatinine urine ratio   Renal function panel   HLD (hyperlipidemia)   Relevant Medications    amLODipine (NORVASC) 2.5 MG tablet   atorvastatin (LIPITOR) 80 MG tablet   Other Relevant Orders   Comprehensive metabolic panel (Completed)   Light headedness    She is not orthostatic.  She has a history of chronic atrial fib, rate controlled without medications , is anticoagulated and has a h/o left brain CVA.  Will send back to [facility] for Zio monitor .      Carotid atherosclerosis, bilateral    <50% by 2021 doppler ultrasound.  She appears to have inadvertently stopped her statin.  Refill sent and patient educated on need to resume for stroke prevention       Relevant Medications   amLODipine (NORVASC) 2.5 MG tablet   atorvastatin (LIPITOR) 80 MG tablet   Subclavian steal syndrome    Suggested by persistent differential of 10 pts (right arm 154/92,  Left:  162/76).  Needs further evaluation ultrasound/doppler of subclavian arteries       Relevant Medications   amLODipine (NORVASC) 2.5 MG tablet   atorvastatin (LIPITOR) 80 MG tablet   Decreased GFR    GFR < 60 intermittently for the last year.  Marland Kitchenadsed to increase  water intake gradually to 60 ounces daily, , avoid NSAID and repeat at next office visit with [facility] ratio  Lab Results  Component Value Date   CREATININE 0.94 11/02/2021   .lastlte No results found for: LABMICR, MICROALBUR         Relevant Orders   Microalbumin / creatinine urine ratio   Renal function panel    I am having Crystal Haas start on amLODipine. I am also having her maintain her multivitamin with minerals, B-complex with vitamin C, GLUCOSAMINE-CHONDROITIN DS PO, fluticasone, EpiPen 2-Pak, azelastine, Magnesium, nystatin cream, traZODone, Eliquis, sertraline, montelukast, losartan, and atorvastatin.  Meds ordered this encounter  Medications   amLODipine (NORVASC) 2.5 MG tablet    Sig: Take 1 tablet (2.5 mg total) by mouth daily.    Dispense:  90 tablet    Refill:  1   atorvastatin (LIPITOR) 80 MG tablet    Sig: Take 1 tablet (80 mg total) by mouth  at bedtime.    Dispense:  90 tablet    Refill:  1     I provided  40 minutes  of  face-to-face time during this encounter reviewing patient's current problems and past surgeries, labs and imaging studies, providing counseling on the above mentioned problems , and coordination  of care  with her cardiologist Dr Fletcher Anon .   Follow-up: Return in about 2 weeks (around 11/16/2021).   Crecencio Mc, MD

## 2021-11-02 NOTE — Assessment & Plan Note (Addendum)
Suggested by persistent differential of 10 pts (right arm 154/92,  Left:  162/76).  Needs further evaluation ultrasound/doppler of subclavian arteries

## 2021-11-02 NOTE — Telephone Encounter (Signed)
Order placed for 14 day zio XT monitor to be mailed to the patient.

## 2021-11-02 NOTE — Assessment & Plan Note (Signed)
reason for incre aseand chronicity of increase unclear.Diet pills may have played a role.  She has stopped these.  Advised to resume amlodipine starting at 2.5 mg daily. Continue losartan 25 mg daily .  Increase to 5 mg after one week for systolic > 150

## 2021-11-02 NOTE — Assessment & Plan Note (Signed)
She is not orthostatic.  She has a history of chronic atrial fib, rate controlled without medications , is anticoagulated and has a h/o left brain CVA.  Will send back to [facility] for Zio monitor .

## 2021-11-02 NOTE — Assessment & Plan Note (Signed)
<  50% by 2021 doppler ultrasound.  She appears to have inadvertently stopped her statin.  Refill sent and patient educated on need to resume for stroke prevention

## 2021-11-02 NOTE — Telephone Encounter (Signed)
Per secure chat Dr. Darrick Huntsman: seeing Crystal Haas today for one week history of very elevated BP's  and dizziness.  ER visit nondiagnostic.  .  she has appt with you in a few weeks,  for chronic atrial fib.  but has been having recurrent dizziness (not vertigo),  and has a differential  BP of 10 pts in her arms .  do you want me or order a holter monitor to assess prior to your visit  Dr. Kirke Corin:   can have our office mail her a Zio monitor.   [name], Please arrange for this patient 2-week ZIO monitor for dizziness and chronic A-fib

## 2021-11-02 NOTE — Patient Instructions (Addendum)
Continue losartan 25 mg daily  Add amlodipine 2.5 mg daily starting this morning.  Follow daily BP readings and increase dose to 5  mg IF:  1) you are still seeing readings of 170 or higher this week  Or  2) your BP is not < 140/90 by next week  Please also resume atorvastatin for management of atherosclerosis.  You will need a blood test in 3-4 weeks to  check liver function

## 2021-11-03 DIAGNOSIS — R944 Abnormal results of kidney function studies: Secondary | ICD-10-CM | POA: Insufficient documentation

## 2021-11-03 NOTE — Addendum Note (Signed)
Addended by: Sherlene Shams on: 11/03/2021 05:37 PM   Modules accepted: Orders

## 2021-11-03 NOTE — Assessment & Plan Note (Signed)
GFR < 60 intermittently for the last year.  Marland Kitchenadsed to increase  water intake gradually to 60 ounces daily, , avoid NSAID and repeat at next office visit with [facility] ratio  Lab Results  Component Value Date   CREATININE 0.94 11/02/2021   .lastlte No results found for: LABMICR, MICROALBUR

## 2021-11-06 NOTE — Telephone Encounter (Signed)
[employer] website  pt  zio scheduled for delivery today 11/06/21. Closing this encounter.

## 2021-11-11 DIAGNOSIS — R42 Dizziness and giddiness: Secondary | ICD-10-CM

## 2021-11-11 DIAGNOSIS — I482 Chronic atrial fibrillation, unspecified: Secondary | ICD-10-CM | POA: Diagnosis not present

## 2021-11-12 DIAGNOSIS — G4733 Obstructive sleep apnea (adult) (pediatric): Secondary | ICD-10-CM | POA: Diagnosis not present

## 2021-11-16 ENCOUNTER — Encounter: Payer: Self-pay | Admitting: Internal Medicine

## 2021-11-16 ENCOUNTER — Ambulatory Visit (INDEPENDENT_AMBULATORY_CARE_PROVIDER_SITE_OTHER): Payer: Medicare HMO

## 2021-11-16 ENCOUNTER — Ambulatory Visit (INDEPENDENT_AMBULATORY_CARE_PROVIDER_SITE_OTHER): Payer: Medicare HMO | Admitting: Internal Medicine

## 2021-11-16 ENCOUNTER — Other Ambulatory Visit: Payer: Self-pay

## 2021-11-16 VITALS — BP 130/70 | HR 79 | Temp 97.7°F | Ht 65.0 in | Wt 246.4 lb

## 2021-11-16 DIAGNOSIS — I482 Chronic atrial fibrillation, unspecified: Secondary | ICD-10-CM

## 2021-11-16 DIAGNOSIS — R61 Generalized hyperhidrosis: Secondary | ICD-10-CM

## 2021-11-16 DIAGNOSIS — I252 Old myocardial infarction: Secondary | ICD-10-CM | POA: Diagnosis not present

## 2021-11-16 DIAGNOSIS — F3342 Major depressive disorder, recurrent, in full remission: Secondary | ICD-10-CM | POA: Diagnosis not present

## 2021-11-16 DIAGNOSIS — R918 Other nonspecific abnormal finding of lung field: Secondary | ICD-10-CM | POA: Diagnosis not present

## 2021-11-16 DIAGNOSIS — I10 Essential (primary) hypertension: Secondary | ICD-10-CM | POA: Diagnosis not present

## 2021-11-16 DIAGNOSIS — R69 Illness, unspecified: Secondary | ICD-10-CM | POA: Diagnosis not present

## 2021-11-16 LAB — CBC WITH DIFFERENTIAL/PLATELET
Basophils Absolute: 0.1 10*3/uL (ref 0.0–0.1)
Basophils Relative: 1.2 % (ref 0.0–3.0)
Eosinophils Absolute: 0.3 10*3/uL (ref 0.0–0.7)
Eosinophils Relative: 3.4 % (ref 0.0–5.0)
HCT: 41.4 % (ref 36.0–46.0)
Hemoglobin: 13.4 g/dL (ref 12.0–15.0)
Lymphocytes Relative: 15.3 % (ref 12.0–46.0)
Lymphs Abs: 1.3 10*3/uL (ref 0.7–4.0)
MCHC: 32.3 g/dL (ref 30.0–36.0)
MCV: 91.9 fl (ref 78.0–100.0)
Monocytes Absolute: 1 10*3/uL (ref 0.1–1.0)
Monocytes Relative: 11.5 % (ref 3.0–12.0)
Neutro Abs: 5.7 10*3/uL (ref 1.4–7.7)
Neutrophils Relative %: 68.6 % (ref 43.0–77.0)
Platelets: 173 10*3/uL (ref 150.0–400.0)
RBC: 4.51 Mil/uL (ref 3.87–5.11)
RDW: 13.9 % (ref 11.5–15.5)
WBC: 8.3 10*3/uL (ref 4.0–10.5)

## 2021-11-16 LAB — TSH: TSH: 4.2 u[IU]/mL (ref 0.35–5.50)

## 2021-11-16 NOTE — Progress Notes (Signed)
Subjective:  Patient ID: Crystal Haas, female    DOB: 1945-12-24  Age: 76 y.o. MRN: [id number]  CC: The primary encounter diagnosis was Major depressive disorder, recurrent, in full remission ([place]). Diagnoses of Chronic atrial fibrillation ([place]), Morbid obesity (HCC), Chronic night sweats, History of inferior wall myocardial infarction, Primary hypertension, and Night sweats were also pertinent to this visit.   This visit occurred during the SARS-CoV-2 public health emergency.  Safety protocols were in place, including screening questions prior to the visit, additional usage of staff PPE, and extensive cleaning of exam room while observing appropriate contact time as indicated for disinfecting solutions.    HPI Crystal Haas presents for   1) HTN:  Patient is tolerating the addition of amlodipine 2.5 mg  to  losartan 25 mg as prescribed and notes no adverse effects.  Home BP readings have been done  daily  and are  generally < 140/80 .  She is avoiding added salt in her diet  2) Night sweats .   Chronic, going on for years.   feels Cold during the day in spite of wearing sweater and keeping thermostat.  At night uses a lightweight blanket and sheet and sweats through pajamas.  Chronic for years. Denies weight loss and cough    4) Chronic atrial fib.  With recent episode of recurrent dizziness.  Has been Wearing a Zio monitor  since Sunday  ordered by [facility]    5) Rash on chest wall: localized to the zio monitor adhesive backing .  Has triamcinolone  cream prescribed from dermatologist . has not used it  on current rash    Outpatient Medications Prior to Visit  Medication Sig Dispense Refill   amLODipine (NORVASC) 2.5 MG tablet Take 1 tablet (2.5 mg total) by mouth daily. 90 tablet 1   apixaban (ELIQUIS) 5 MG TABS tablet TAKE 1 TABLET BY MOUTH TWICE A DAY 60 tablet 6   atorvastatin (LIPITOR) 80 MG tablet Take 1 tablet (80 mg total) by mouth at bedtime. 90 tablet 1   azelastine (OPTIVAR) 0.05 %  ophthalmic solution Place 1 drop into both eyes 2 (two) times daily.     B Complex-C (B-COMPLEX WITH VITAMIN C) tablet Take 1 tablet by mouth daily.     fluticasone (FLONASE) 50 MCG/ACT nasal spray Place 2 sprays into both nostrils daily. 16 g 0   GLUCOSAMINE-CHONDROITIN DS PO Take 3,000 mg by mouth daily.     losartan (COZAAR) 25 MG tablet Take 1 tablet (25 mg total) by mouth daily. 90 tablet 0   Magnesium 250 MG TABS Take 250 mg by mouth daily.     montelukast (SINGULAIR) 10 MG tablet Take 1 tablet (10 mg total) by mouth at bedtime. 90 tablet 0   Multiple Vitamin (MULTIVITAMIN WITH MINERALS) TABS tablet Take 1 tablet by mouth daily. One-A-Day Active 65+     nystatin cream (MYCOSTATIN) Apply 1 application topically 2 (two) times daily. 30 g 0   sertraline (ZOLOFT) 50 MG tablet TAKE 1 TABLET BY MOUTH EVERY DAY 90 tablet 1   traZODone (DESYREL) 50 MG tablet Take 0.5-1 tablets (25-50 mg total) by mouth at bedtime as needed for sleep. 30 tablet 3   EPIPEN 2-PAK 0.3 MG/0.3ML SOAJ injection Inject 0.3 mLs (0.3 mg total) into the muscle as directed. AS NEEDED FOR ANAPHYLAXIS 2 Device 0   No facility-administered medications prior to visit.    Review of Systems;  Patient denies headache, fevers, malaise, unintentional weight loss, skin rash,  eye pain, sinus congestion and sinus pain, sore throat, dysphagia,  hemoptysis , cough, dyspnea, wheezing, chest pain, palpitations, orthopnea, edema, abdominal pain, nausea, melena, diarrhea, constipation, flank pain, dysuria, hematuria, urinary  Frequency, nocturia, numbness, tingling, seizures,  Focal weakness, Loss of consciousness,  Tremor, insomnia, depression, anxiety, and suicidal ideation.      Objective:  BP 130/70 (BP Location: Left Arm, Patient Position: Sitting, Cuff Size: Large)    Pulse 79    Temp 97.7 F (36.5 C) (Oral)    Ht 5\' 5"  (1.651 m)    Wt 246 lb 6.4 oz (111.8 kg)    SpO2 96%    BMI 41.00 kg/m   BP Readings from Last 3 Encounters:   11/16/21 130/70  11/02/21 (!) 170/96  07/17/21 130/80    Wt Readings from Last 3 Encounters:  11/16/21 246 lb 6.4 oz (111.8 kg)  11/02/21 243 lb 6.4 oz (110.4 kg)  07/17/21 254 lb (115.2 kg)    General appearance: alert, cooperative and appears stated age Ears: normal TM's and external ear canals both ears Throat: lips, mucosa, and tongue normal; teeth and gums normal Neck: no adenopathy, no carotid bruit, supple, symmetrical, trachea midline and thyroid not enlarged, symmetric, no tenderness/mass/nodules Back: symmetric, no curvature. ROM normal. No CVA tenderness. Lungs: clear to auscultation bilaterally Heart: regular rate and rhythm, S1, S2 normal, no murmur, click, rub or gallop Abdomen: soft, non-tender; bowel sounds normal; no masses,  no organomegaly Pulses: 2+ and symmetric Skin: Skin color, texture, turgor normal. No rashes or lesions Lymph nodes: Cervical, supraclavicular, and axillary nodes normal.  Lab Results  Component Value Date   HGBA1C 5.8 07/17/2021   HGBA1C 5.7 (H) 09/20/2020   HGBA1C 6.0 07/26/2019    Lab Results  Component Value Date   CREATININE 0.94 11/02/2021   CREATININE 0.88 07/17/2021   CREATININE 0.97 09/19/2020    Lab Results  Component Value Date   WBC 8.3 11/16/2021   HGB 13.4 11/16/2021   HCT 41.4 11/16/2021   PLT 173.0 11/16/2021   GLUCOSE 99 11/02/2021   CHOL 94 09/20/2020   TRIG 90 09/20/2020   HDL 43 09/20/2020   LDLDIRECT 57.0 10/31/2020   LDLCALC 33 09/20/2020   ALT 16 11/02/2021   AST 18 11/02/2021   NA 141 11/02/2021   K 4.4 11/02/2021   CL 104 11/02/2021   CREATININE 0.94 11/02/2021   BUN 14 11/02/2021   CO2 29 11/02/2021   TSH 4.20 11/16/2021   INR 1.1 09/19/2020   HGBA1C 5.8 07/17/2021    MM 3D SCREEN BREAST BILATERAL  Result Date: 06/27/2021 CLINICAL DATA:  Screening. EXAM: DIGITAL SCREENING BILATERAL MAMMOGRAM WITH TOMOSYNTHESIS AND CAD TECHNIQUE: Bilateral screening digital craniocaudal and mediolateral  oblique mammograms were obtained. Bilateral screening digital breast tomosynthesis was performed. The images were evaluated with computer-aided detection. COMPARISON:  Previous exam(s). ACR Breast Density Category b: There are scattered areas of fibroglandular density. FINDINGS: There are no findings suspicious for malignancy. Post reduction changes. IMPRESSION: No mammographic evidence of malignancy. A result letter of this screening mammogram will be mailed directly to the patient. RECOMMENDATION: Screening mammogram in one year. (Code:SM-B-01Y) BI-RADS CATEGORY  2: Benign. Electronically Signed   By: Valentino Saxon M.D.   On: 06/27/2021 09:46   Assessment & Plan:   Problem List Items Addressed This Visit     Chronic atrial fibrillation ([place])    Currently wearing a zio monitor to rule out sinus pauses and atrial  flutter as causes for her recurrent  episodes of dizziess. I have ordered and reviewed a 12 lead EKG and find she is in atrial fibrillation with a slow irregular rhythm .        Morbid obesity ([place])   Hypertension    Well controlled on current regimen. Renal function stable, no changes today.      Night sweats    Symptoms have been present for over six months and are unaccompanied by weight loss and cough.  Chest x ray has an  increased reticular pattern without infiltrate;  CBC is normal.     Lab Results  Component Value Date   WBC 8.3 11/16/2021   HGB 13.4 11/16/2021   HCT 41.4 11/16/2021   MCV 91.9 11/16/2021   PLT 173.0 11/16/2021         Major depressive disorder, recurrent, in full remission ([place]) - Primary   Other Visit Diagnoses     Chronic night sweats       Relevant Orders   CBC with Differential/Platelet (Completed)   DG Chest 2 View (Completed)   TSH (Completed)   History of inferior wall myocardial infarction       Relevant Orders   EKG 12-Lead (Completed)       I spent 30 minutes dedicated to the care of this patient on the date of this  encounter to include pre-visit review of patient's medical history,  most recent imaging studies, Face-to-face time with the patient , and post visit ordering of testing and therapeutics.    Follow-up: No follow-ups on file.   Crecencio Mc, MD

## 2021-11-16 NOTE — Patient Instructions (Addendum)
Continue amlodipine and losartan at current doses   You may use the triamcinolone ointment on your rash,  and either benadryl or hydroxyzine at night for the itching.  If the chest x ray and CBC are normal,  we will try changing sertraline to escitalopram to see if we can reduce the hot flashes at night

## 2021-11-18 NOTE — Assessment & Plan Note (Signed)
Well controlled on current regimen. Renal function stable, no changes today. 

## 2021-11-18 NOTE — Assessment & Plan Note (Addendum)
Currently wearing a zio monitor to rule out sinus pauses and atrial  flutter as causes for her recurrent episodes of dizziess. I have ordered and reviewed a 12 lead EKG and find she is in atrial fibrillation with a slow irregular rhythm .

## 2021-11-18 NOTE — Assessment & Plan Note (Addendum)
Symptoms have been present for over six months and are unaccompanied by weight loss and cough.  Chest x ray has an  increased reticular pattern without infiltrate;  CBC is normal.     Lab Results  Component Value Date   WBC 8.3 11/16/2021   HGB 13.4 11/16/2021   HCT 41.4 11/16/2021   MCV 91.9 11/16/2021   PLT 173.0 11/16/2021

## 2021-11-26 ENCOUNTER — Telehealth: Payer: Self-pay | Admitting: Family Medicine

## 2021-11-26 NOTE — Telephone Encounter (Signed)
Pt called in wanting to let the provider know that the heart monitor is off so provider can schedule the appointment

## 2021-11-26 NOTE — Telephone Encounter (Signed)
Is she trying to schedule an appointment with me or is this supposed to be with cardiology for follow-up after the zio monitor has come off?

## 2021-12-04 ENCOUNTER — Telehealth: Payer: Self-pay | Admitting: Internal Medicine

## 2021-12-04 DIAGNOSIS — J22 Unspecified acute lower respiratory infection: Secondary | ICD-10-CM

## 2021-12-04 NOTE — Telephone Encounter (Signed)
Pt called in stating that Dr. Darrick Huntsman sent pt for chest xray. Pt stated that Dr. Darrick Huntsman thinks Pt have walking pneumonia. Pt stated that she has an appt with Dr. Darrick Huntsman on 12/11/2021 at 1pm. Pt stated that she is coming down with a cold and she was wondering if Dr. Darrick Huntsman can move her appt up sooner. Pt requesting callback

## 2021-12-05 DIAGNOSIS — J22 Unspecified acute lower respiratory infection: Secondary | ICD-10-CM | POA: Insufficient documentation

## 2021-12-05 MED ORDER — AZITHROMYCIN 250 MG PO TABS: ORAL_TABLET | ORAL | 0 refills | Status: DC

## 2021-12-05 NOTE — Telephone Encounter (Signed)
Spoke with pt and she stated that yesterday she started with a runny nose, cough and extreme fatigue. Pt stated that she has not been around anyone so she didn't think she needed to be tested for covid. I have moved pt's appt to Friday the 17th at 9 am, virtual.

## 2021-12-05 NOTE — Telephone Encounter (Signed)
Pt aware medication has been sent in. Do you want pt to keep as a virtual visit or do you prefer her to come in?

## 2021-12-05 NOTE — Assessment & Plan Note (Signed)
Will treat for atypical pneumonia given onset of symptoms

## 2021-12-06 NOTE — Telephone Encounter (Signed)
Pt is okay with in person visit tomorrow.

## 2021-12-07 ENCOUNTER — Encounter: Payer: Self-pay | Admitting: Internal Medicine

## 2021-12-07 ENCOUNTER — Ambulatory Visit (INDEPENDENT_AMBULATORY_CARE_PROVIDER_SITE_OTHER): Payer: Medicare HMO | Admitting: Internal Medicine

## 2021-12-07 ENCOUNTER — Telehealth: Payer: Self-pay | Admitting: Cardiovascular Disease

## 2021-12-07 ENCOUNTER — Other Ambulatory Visit: Payer: Self-pay

## 2021-12-07 VITALS — BP 120/72 | HR 60 | Temp 98.0°F | Ht 65.0 in | Wt 249.0 lb

## 2021-12-07 DIAGNOSIS — R051 Acute cough: Secondary | ICD-10-CM | POA: Diagnosis not present

## 2021-12-07 DIAGNOSIS — J189 Pneumonia, unspecified organism: Secondary | ICD-10-CM

## 2021-12-07 DIAGNOSIS — R42 Dizziness and giddiness: Secondary | ICD-10-CM | POA: Diagnosis not present

## 2021-12-07 DIAGNOSIS — R21 Rash and other nonspecific skin eruption: Secondary | ICD-10-CM | POA: Diagnosis not present

## 2021-12-07 DIAGNOSIS — I482 Chronic atrial fibrillation, unspecified: Secondary | ICD-10-CM | POA: Diagnosis not present

## 2021-12-07 LAB — POCT INFLUENZA A/B
Influenza A, POC: NEGATIVE
Influenza B, POC: NEGATIVE

## 2021-12-07 LAB — POC COVID19 BINAXNOW: SARS Coronavirus 2 Ag: NEGATIVE

## 2021-12-07 MED ORDER — PREDNISONE 10 MG PO TABS: ORAL_TABLET | ORAL | 0 refills | Status: DC

## 2021-12-07 MED ORDER — AMOXICILLIN-POT CLAVULANATE 875-125 MG PO TABS: 1.0000 | ORAL_TABLET | Freq: Two times a day (BID) | ORAL | 0 refills | Status: DC

## 2021-12-07 MED ORDER — TRIAMCINOLONE ACETONIDE 0.1 % EX CREA: 1.0000 "application " | TOPICAL_CREAM | Freq: Two times a day (BID) | CUTANEOUS | 0 refills | Status: DC

## 2021-12-07 NOTE — Patient Instructions (Addendum)
You do not have COVID or Influenza:  However,  you have  URI which can develop into a sinus infection in spite of taking azithromycin.   For the symptoms,  here's "what does what ":  Phenylephrine and pseudoephedrine are DECONGESTANTS. Use to prevent sinus infections.  Side effects:  elevated blood pressure ,  rapid heart rate,  insomnia.  So I am prescribing prednisone instead in a 6 week taper.   Guaifensin  is a MUCOLYTIC:  thins out the 'rubber cement snot" that is hard to clear /blow/cough up  Dextromethorphan;  COUGH SUPPRESSANT (Robitussin,  Delsym products)   Dipenhydramine:   (generic benadryl) :  dries up the runny nose and counteracts the jitteriness that can be a side effect of the DECONGESTANTS  PICK UP THE AMOXICILLIN BUT DO NOT START UNLESS SINUSES GET WORSE.   Marland KitchenTaking an antibiotic can create an imbalance in the normal population of bacteria that live in the small intestine.  This imbalance can persist for 3 months.   Taking a probiotic ( Align, Floraque or Culturelle), the generic version of one of these over the counter medications, or an alternative form (kombucha,  Yogurt, or another dietary source) for a minimum of 3 weeks may help prevent a serious antibiotic associated diarrhea  Called clostridium dificile colitis that occurs when the bacteria population is altered .  Taking a probiotic may also prevent vaginitis due to yeast infections and can be continued indefinitely if you feel that it improves your digestion or your elimination (bowels).      I AM PRESCRIBING AND OINTMENT CALLED TRIAMCINOLONE FOR THE ITCHY RASH ON YOUR LEFT LEG.

## 2021-12-07 NOTE — Assessment & Plan Note (Signed)
Rash on left lateral leg /shins is  c/w psoriasis or eczema.  Triamcinolone ointment prescribed for twice daily use

## 2021-12-07 NOTE — Telephone Encounter (Signed)
Iran Ouch, MD  12/07/2021  2:38 PM EST     Inform patient that outpatient monitor was fine and showed good control of atrial fibrillation with no significant bradycardia.  She is due for a follow-up visit with me.

## 2021-12-07 NOTE — Telephone Encounter (Signed)
Reviewed with Dr. Fletcher Anon prior to calling the patient that he is booked out until May. Per Dr. Fletcher Anon, the patient needs to follow up with an APP sooner.  I have called and spoken with the patient regarding her heart monitor results and Dr. Tyrell Antonio recommendations to follow up with his APP.  The patient voices understanding of her results. She is agreeable with seeing Christell Faith, PA on 12/10/21 at 2:25 pm.  She was very appreciative of the call.

## 2021-12-07 NOTE — Progress Notes (Signed)
Subjective:  Patient ID: Crystal Haas, female    DOB: November 28, 1945  Age: 76 y.o. MRN: [id number]  CC: The primary encounter diagnosis was Acute cough. Diagnoses of Rash and nonspecific skin eruption and Atypical pneumonia were also pertinent to this visit.   This visit occurred during the SARS-CoV-2 public health emergency.  Safety protocols were in place, including screening questions prior to the visit, additional usage of staff PPE, and extensive cleaning of exam room while observing appropriate contact time as indicated for disinfecting solutions.    HPI Khrystyn Merriott presents for follow up on recent treatment for  atypical pneumonia Chief Complaint  Patient presents with   Follow-up    Follow up on pneumonia    Currently under treatment for atypical pneumonia secondary to Jan  27  chest x ray which noted "Pattern of increased reticular opacity of the lungs. No confluent airspace disease." Patient had no cough initially,  only  night sweats , then developed rhinitis, cough and fatigue. On Feb 13 .    azithromycin added Feb 15.  Flu and covid tests done today and negative   Currenlty having left sinus congestion with yellow drainage.  No fevers or facial pain    2) itchy patches on left lower leg . Psoriasis suspected.      Outpatient Medications Prior to Visit  Medication Sig Dispense Refill   amLODipine (NORVASC) 2.5 MG tablet Take 1 tablet (2.5 mg total) by mouth daily. 90 tablet 1   apixaban (ELIQUIS) 5 MG TABS tablet TAKE 1 TABLET BY MOUTH TWICE A DAY 60 tablet 6   atorvastatin (LIPITOR) 80 MG tablet Take 1 tablet (80 mg total) by mouth at bedtime. 90 tablet 1   azelastine (OPTIVAR) 0.05 % ophthalmic solution Place 1 drop into both eyes 2 (two) times daily.     azithromycin (ZITHROMAX) 250 MG tablet Take 2 tablets on day 1, then 1 tablet daily on days 2 through 5 6 tablet 0   B Complex-C (B-COMPLEX WITH VITAMIN C) tablet Take 1 tablet by mouth daily.     fluticasone (FLONASE) 50  MCG/ACT nasal spray Place 2 sprays into both nostrils daily. 16 g 0   GLUCOSAMINE-CHONDROITIN DS PO Take 3,000 mg by mouth daily.     losartan (COZAAR) 25 MG tablet Take 1 tablet (25 mg total) by mouth daily. 90 tablet 0   Magnesium 250 MG TABS Take 250 mg by mouth daily.     montelukast (SINGULAIR) 10 MG tablet Take 1 tablet (10 mg total) by mouth at bedtime. 90 tablet 0   Multiple Vitamin (MULTIVITAMIN WITH MINERALS) TABS tablet Take 1 tablet by mouth daily. One-A-Day Active 65+     nystatin cream (MYCOSTATIN) Apply 1 application topically 2 (two) times daily. 30 g 0   sertraline (ZOLOFT) 50 MG tablet TAKE 1 TABLET BY MOUTH EVERY DAY 90 tablet 1   traZODone (DESYREL) 50 MG tablet Take 0.5-1 tablets (25-50 mg total) by mouth at bedtime as needed for sleep. 30 tablet 3   No facility-administered medications prior to visit.    Review of Systems;  Patient denies headache, fevers, malaise, unintentional weight loss, skin rash, eye pain, sinus congestion and sinus pain, sore throat, dysphagia,  hemoptysis , cough, dyspnea, wheezing, chest pain, palpitations, orthopnea, edema, abdominal pain, nausea, melena, diarrhea, constipation, flank pain, dysuria, hematuria, urinary  Frequency, nocturia, numbness, tingling, seizures,  Focal weakness, Loss of consciousness,  Tremor, insomnia, depression, anxiety, and suicidal ideation.      Objective:  BP 120/72 (BP Location: Left Arm, Patient Position: Sitting, Cuff Size: Large)    Pulse 60    Temp 98 F (36.7 C) (Oral)    Ht 5\' 5"  (1.651 m)    Wt 249 lb (112.9 kg)    SpO2 97%    BMI 41.44 kg/m   BP Readings from Last 3 Encounters:  12/07/21 120/72  11/16/21 130/70  11/02/21 (!) 170/96    Wt Readings from Last 3 Encounters:  12/07/21 249 lb (112.9 kg)  11/16/21 246 lb 6.4 oz (111.8 kg)  11/02/21 243 lb 6.4 oz (110.4 kg)    General appearance: alert, cooperative and appears stated age Ears: normal TM's and external ear canals both ears Throat:  lips, mucosa, and tongue normal; teeth and gums normal Neck: no adenopathy, no carotid bruit, supple, symmetrical, trachea midline and thyroid not enlarged, symmetric, no tenderness/mass/nodules Back: symmetric, no curvature. ROM normal. No CVA tenderness. Lungs: clear to auscultation bilaterally Heart: regular rate and rhythm, S1, S2 normal, no murmur, click, rub or gallop Abdomen: soft, non-tender; bowel sounds normal; no masses,  no organomegaly Pulses: 2+ and symmetric Skin:  left lwer shin with two erythematous patches approx 2 cm in diameter  Lymph nodes: Cervical, supraclavicular, and axillary nodes normal.  Lab Results  Component Value Date   HGBA1C 5.8 07/17/2021   HGBA1C 5.7 (H) 09/20/2020   HGBA1C 6.0 07/26/2019    Lab Results  Component Value Date   CREATININE 0.94 11/02/2021   CREATININE 0.88 07/17/2021   CREATININE 0.97 09/19/2020    Lab Results  Component Value Date   WBC 8.3 11/16/2021   HGB 13.4 11/16/2021   HCT 41.4 11/16/2021   PLT 173.0 11/16/2021   GLUCOSE 99 11/02/2021   CHOL 94 09/20/2020   TRIG 90 09/20/2020   HDL 43 09/20/2020   LDLDIRECT 57.0 10/31/2020   LDLCALC 33 09/20/2020   ALT 16 11/02/2021   AST 18 11/02/2021   NA 141 11/02/2021   K 4.4 11/02/2021   CL 104 11/02/2021   CREATININE 0.94 11/02/2021   BUN 14 11/02/2021   CO2 29 11/02/2021   TSH 4.20 11/16/2021   INR 1.1 09/19/2020   HGBA1C 5.8 07/17/2021    MM 3D SCREEN BREAST BILATERAL  Result Date: 06/27/2021 CLINICAL DATA:  Screening. EXAM: DIGITAL SCREENING BILATERAL MAMMOGRAM WITH TOMOSYNTHESIS AND CAD TECHNIQUE: Bilateral screening digital craniocaudal and mediolateral oblique mammograms were obtained. Bilateral screening digital breast tomosynthesis was performed. The images were evaluated with computer-aided detection. COMPARISON:  Previous exam(s). ACR Breast Density Category b: There are scattered areas of fibroglandular density. FINDINGS: There are no findings suspicious for  malignancy. Post reduction changes. IMPRESSION: No mammographic evidence of malignancy. A result letter of this screening mammogram will be mailed directly to the patient. RECOMMENDATION: Screening mammogram in one year. (Code:SM-B-01Y) BI-RADS CATEGORY  2: Benign. Electronically Signed   By: Valentino Saxon M.D.   On: 06/27/2021 09:46   Assessment & Plan:   Problem List Items Addressed This Visit     Atypical pneumonia    Finish azithromycin.  Prednisone added for sinus congestion.  Add augmentin if sinus symptoms persist. Daily use of a probiotic advised for 3 weeks.       Rash and nonspecific skin eruption    Rash on left lateral leg /shins is  c/w psoriasis or eczema.  Triamcinolone ointment prescribed for twice daily use       Relevant Medications   triamcinolone cream (KENALOG) 0.1 %   Other Visit  Diagnoses     Acute cough    -  Primary   Relevant Orders   POC COVID-19 (Completed)   POCT Influenza A/B (Completed)       I spent 30 minutes dedicated to the care of this patient on the date of this encounter to include pre-visit review of patient's medical history,  most recent imaging studies, Face-to-face time with the patient , and post visit ordering of testing and therapeutics.    Follow-up: No follow-ups on file.   Crecencio Mc, MD

## 2021-12-07 NOTE — Assessment & Plan Note (Signed)
Finish azithromycin.  Prednisone added for sinus congestion.  Add augmentin if sinus symptoms persist. Daily use of a probiotic advised for 3 weeks.

## 2021-12-10 ENCOUNTER — Encounter: Payer: Self-pay | Admitting: Cardiovascular Disease

## 2021-12-10 ENCOUNTER — Other Ambulatory Visit: Payer: Self-pay

## 2021-12-10 ENCOUNTER — Ambulatory Visit (INDEPENDENT_AMBULATORY_CARE_PROVIDER_SITE_OTHER): Payer: Medicare HMO | Admitting: Cardiovascular Disease

## 2021-12-10 ENCOUNTER — Ambulatory Visit: Payer: Medicare HMO | Admitting: Physician Assistant

## 2021-12-10 VITALS — BP 140/80 | HR 79 | Ht 65.0 in | Wt 245.5 lb

## 2021-12-10 DIAGNOSIS — I10 Essential (primary) hypertension: Secondary | ICD-10-CM | POA: Diagnosis not present

## 2021-12-10 DIAGNOSIS — I482 Chronic atrial fibrillation, unspecified: Secondary | ICD-10-CM | POA: Diagnosis not present

## 2021-12-10 DIAGNOSIS — E785 Hyperlipidemia, unspecified: Secondary | ICD-10-CM | POA: Diagnosis not present

## 2021-12-10 DIAGNOSIS — Z8673 Personal history of transient ischemic attack (TIA), and cerebral infarction without residual deficits: Secondary | ICD-10-CM | POA: Diagnosis not present

## 2021-12-10 NOTE — Progress Notes (Unsigned)
Cardiology Office Note    Date:  12/10/2021   ID:  Crystal Haas, DOB 1946/07/21, MRN [id number]  PCP:  Sherlene Shams, MD  Cardiologist:  Lorine Bears, MD  Electrophysiologist:  None   Chief Complaint: Follow up  History of Present Illness:   Crystal Haas is a 76 y.o. female with history of permanent Afib on Eliquis, CVA, HTN, OSA on CPAP, and depression who presents for follow up of ***  Echo in 04/2015 showed a preserved LV systolic function with mild biatrial enlargement. She was admitted in 08/2020 with numbness, slurred speech and weakness of the right arm and leg.  Symptoms lasted for about 20 minutes. MRI showed small infarcts in the left insula and overlying frontal operculum with possible infarct in the left corona radiate.  Echo showed normal LV systolic function with no obvious source of cardiac embolism.  Carotid Doppler showed no significant disease.  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  She was recently seen at an outside ED on 10/29/2021 with dizziness. High-sensitivity troponin negative, CT head not acute, CXR without acute cardiopulmonary process and EKG reported as Afib. BP was elevated in the 170s to 190s systolic.   She followed up with her PCP on 11/02/2021 and continued to note dizziness.  She reported a BP differential of 10 mmHg in her arms. PCP note has indicated she has been using a wrist BP cuff at home. She was restarted on amlodipine (previously stopped with well controlled BP).  Subsequent outpatient cardiac monitoring showed a 100% Afib burden with ventricular rates ranging from 40-125 bpm with an average rate of 69 bpm. There were rare PVCs. No evidence of prolonged bradycardia or significant pauses.   She was seen by her PCP on 12/07/2021 and diagnosed with atypical PNA and a rash consistent with psoriasis or eczema.   ***   Labs  independently reviewed: 10/2021 - TSH normal, Hgb 13.4, PLT 173, potassium 4.4, BUN 14, serum creatinine 0.94, albumin 4.2, AST/ALT normal 06/2021 - A1c 5.8 10/2020 - direct LDL 57, TC 94, TG 90, HDL 43  Past Medical History:  Diagnosis Date   Anemia    distant past   Anxiety    Arthritis    "everywhere" - big toes worst   Chronic atrial fibrillation (HCC)    a. on eliquis; b. 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   Seasonal allergies    takes allergy weekly   Status post bilateral breast reduction 09/06/2016   Overview:  08/29/16    Past Surgical History:  Procedure Laterality Date   BREAST REDUCTION SURGERY Bilateral 08/29/2016   Procedure: BILATERAL MAMMARY REDUCTION  (BREAST)WITH LIPOSUCTION;  Surgeon: Peggye Form, DO;  Location: [facility];  Service: Plastics;  Laterality: Bilateral;   CATARACT EXTRACTION W/PHACO Left 05/03/2015   Procedure: CATARACT EXTRACTION PHACO AND INTRAOCULAR LENS PLACEMENT (IOC);  Surgeon: Lockie Mola, MD;  Location: [facility];  Service: Ophthalmology;  Laterality: Left;  CPAP   CATARACT EXTRACTION W/PHACO  Right 10/09/2016   Procedure: CATARACT EXTRACTION PHACO AND INTRAOCULAR LENS PLACEMENT (IOC);  Surgeon: Lockie Molahadwick Brasington, MD;  [facility];  Service: Ophthalmology;  Laterality: Right;  sleep apnea   CHONDROPLASTY Right 04/29/2016   Procedure: CHONDROPLASTY;  Surgeon: Donato HeinzJames P Hooten, MD;  Location: [facility];  Service: Orthopedics;  Laterality: Right;   COLONOSCOPY WITH PROPOFOL N/A 10/24/2018   Procedure: COLONOSCOPY WITH PROPOFOL;  Surgeon: Pasty Spillersahiliani, Varnita B, MD;  Location: [facility];  Service: Endoscopy;  Laterality: N/A;   EYE SURGERY     KNEE ARTHROPLASTY  Right 03/25/2018   Procedure: COMPUTER ASSISTED TOTAL KNEE ARTHROPLASTY;  Surgeon: Donato HeinzHooten, James P, MD;  Location: [facility];  Service: Orthopedics;  Laterality: Right;   KNEE ARTHROSCOPY WITH LATERAL MENISECTOMY  04/29/2016   Procedure: KNEE ARTHROSCOPY WITH LATERAL MENISECTOMY;  Surgeon: Donato HeinzJames P Hooten, MD;  Location: [facility];  Service: Orthopedics;;   KNEE ARTHROSCOPY WITH MEDIAL MENISECTOMY  04/29/2016   Procedure: KNEE ARTHROSCOPY WITH MEDIAL MENISECTOMY;  Surgeon: Donato HeinzJames P Hooten, MD;  Location: [facility];  Service: Orthopedics;;   REDUCTION MAMMAPLASTY Bilateral 09/2016   RETINAL DETACHMENT SURGERY Left May 03, 2015   Dr. Inez PilgrimBrasington[facility]   TUBAL LIGATION      Current Medications: No outpatient medications have been marked as taking for the 12/10/21 encounter (Appointment) with Sondra Bargesunn, Sindhu Nguyen M, PA-C.    Allergies:   Ivp dye [iodinated contrast media] and Tape   Social History   Socioeconomic History   Marital status: Widowed    Spouse name: Not on file   Number of children: Not on file   Years of education: Not on file   Highest education level: Not on file  Occupational History   Not on file  Tobacco Use   Smoking status: Former    Packs/day: 0.25    Years: 2.00    Pack years: 0.50    Types: Cigarettes    Quit date: 10/21/1969    Years since quitting: 52.1   Smokeless tobacco: Never  Vaping Use   Vaping Use: Never used  Substance and Sexual Activity   Alcohol use: Not Currently    Comment: occasional glass of wine   Drug use: No   Sexual activity: Not on file  Other Topics Concern   Not on file  Social History Narrative   Not on file   Social Determinants of Health   Financial Resource Strain: Low Risk    Difficulty of Paying Living Expenses: Not hard at all  Food Insecurity: No Food Insecurity   Worried About Programme researcher, broadcasting/film/videounning Out of Food in the Last Year: Never true   Ran Out of Food in the Last Year: Never true  Transportation Needs: No Transportation Needs   Lack of  Transportation (Medical): No   Lack of Transportation (Non-Medical): No  Physical Activity: Not on file  Stress: No Stress Concern Present   Feeling of Stress : Not at all  Social Connections: Unknown   Frequency of Communication with Friends and Family: More than three times a week   Frequency of Social Gatherings with Friends and Family: Not on file   Attends Religious Services: Not on file   Active Member of Clubs or Organizations: Not on file   Attends BankerClub or Organization Meetings: Not on file   Marital Status: Not on file     Family History:  The patient's family history includes Alcoholism in an other family member; Heart disease in an other family member. There is no history of  Breast cancer.  ROS:   ROS   EKGs/Labs/Other Studies Reviewed:    Studies reviewed were summarized above. The additional studies were reviewed today:  Zio patch 10/2021: Atrial Fibrillation occurred continuously (100% burden), ranging from 40-125 bpm (avg of 69 bpm). Rare PVCs. No significant pauses or prolonged bradycardia. __________  2D echo 08/2020:  1. Left ventricular ejection fraction, by estimation, is 60 to 65%. The  left ventricle has normal function. The left ventricle has no regional  wall motion abnormalities. Left ventricular diastolic parameters are  indeterminate.   2. Right ventricular systolic function is normal. The right ventricular  size is normal.   3. Left atrial size was moderately dilated.   4. Right atrial size was mildly dilated.   5. The mitral valve is normal in structure. Trivial mitral valve  regurgitation.   6. The aortic valve was not well visualized. Aortic valve regurgitation  is not visualized. Mild aortic valve sclerosis is present, with no  evidence of aortic valve stenosis.  __________  Eugenie Birks MPI 04/2020: There was no ST segment deviation noted during stress. The study is normal. This is a low risk study. The left ventricular ejection fraction  is normal (55-65%). There is no evidence for ischemia __________  2D echo 04/2015: - Left ventricle: The cavity size was normal. Wall thickness was    normal. Systolic function was normal. The estimated ejection    fraction was in the range of 55% to 60%. Wall motion was normal;    there were no regional wall motion abnormalities.  - Left atrium: The atrium was mildly dilated.  - Right atrium: The atrium was mildly dilated.   EKG:  EKG is ordered today.  The EKG ordered today demonstrates ***  Recent Labs: 11/02/2021: ALT 16; BUN 14; Creatinine, Ser 0.94; Potassium 4.4; Sodium 141 11/16/2021: Hemoglobin 13.4; Platelets 173.0; TSH 4.20  Recent Lipid Panel    Component Value Date/Time   CHOL 94 09/20/2020 0632   CHOL 186 09/04/2015 1051   TRIG 90 09/20/2020 0632   HDL 43 09/20/2020 0632   HDL 66 09/04/2015 1051   CHOLHDL 2.2 09/20/2020 0632   VLDL 18 09/20/2020 0632   LDLCALC 33 09/20/2020 0632   LDLCALC 99 09/04/2015 1051   LDLDIRECT 57.0 10/31/2020 1054    PHYSICAL EXAM:    VS:  There were no vitals taken for this visit.  BMI: There is no height or weight on file to calculate BMI.  Physical Exam  Wt Readings from Last 3 Encounters:  12/07/21 249 lb (112.9 kg)  11/16/21 246 lb 6.4 oz (111.8 kg)  11/02/21 243 lb 6.4 oz (110.4 kg)     ASSESSMENT & PLAN:   Dizziness: ***  Permanent Afib: ***.  CHADS2VASc at least 6 (HTN, age x 1, stroke x 2, vascular disease, sex category).  She remains on Eliquis 5 mg bid (does not meet reduced dosing criteria).   HTN: Blood pressure ***.  She has been using a wrist BP cuff at home.  Historically, these are inaccurate.  I recommend she obtain a brachial BP cuff for home monitoring.  BP in the bilateral upper extremities today ***.  Schedule carotid artery ultrasound to evaluate the subclavian arteries.   Carotid artery disease: Schedule ultrasound as above.  History of left MCA CVA: CT head without acute intracranial process last  month.  She has been anticoagulated with Eliquis.  ***  HLD: LDL 57 in 10/2020.  ***   {Are you ordering a CV  Procedure (e.g. stress test, cath, DCCV, TEE, etc)?   Press F2        :[id number]}     Disposition: F/u with Dr. Kirke Corin or an APP in ***.   Medication Adjustments/Labs and Tests Ordered: Current medicines are reviewed at length with the patient today.  Concerns regarding medicines are outlined above. Medication changes, Labs and Tests ordered today are summarized above and listed in the Patient Instructions accessible in Encounters.   Signed, Eula Listen, PA-C 12/10/2021 7:22 AM     [facility] [place] [address] [place], [place] [address] [phone]

## 2021-12-10 NOTE — Telephone Encounter (Signed)
Patient had appointment with APP today and provider opened his schedule. Offered to switch patient to see Dr. Kirke Corin and she was very appreciative. No other needs at this time.

## 2021-12-10 NOTE — Progress Notes (Signed)
Cardiology Office Note   Date:  12/10/2021   ID:  Crystal Haas, DOB 1945-11-26, MRN [id number]  PCP:  Sherlene Shams, MD  Cardiologist:   Lorine Bears, MD   Chief Complaint  Patient presents with   Other    OD 6 month f/u. C/o HTN and SOB with exertion.  Meds reviewed verbally with pt.      History of Present Illness: Crystal Haas is a 76 y.o. female who presents for a follow-up visit regarding chronic atrial fibrillation and hypertension.  Echocardiogram in July, 2016 showed normal LV systolic function with mildly dilated right and left atrium.  She is tolerating anticoagulation with Eliquis.   She has known history of sleep apnea on CPAP. She had previous knee surgery.  She was hospitalized in November 2021 with numbness, slurred speech and weakness of the right arm and leg.  Symptoms lasted for about 20 minutes. MRI showed small infarcts in the left insula and overlying frontal operculum with possible infarct in the left corona radiate.    Echocardiogram showed normal LV systolic function with no obvious source of cardiac embolism.  Carotid Doppler showed no significant disease.  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  She had a stressful December related to her son's health issues.  She had to go see him in [place] and her blood pressure started increasing during that time.  She was exposed to mold there and reports being allergic to it.  When she returned to [place], she had episodes of dizziness and vertigo and had to go to the emergency room in [place].  Her blood pressure was noted to be significantly elevated.  Ventricular rate was controlled.  Subsequently, amlodipine was added.  Dizziness and vertigo resolved and blood pressure became more controlled.  She had an outpatient ZIO monitor which showed good control of atrial fibrillation with  average ventricular rate of 69 bpm with rare PVCs.    Past Medical History:  Diagnosis Date   Anemia    distant past   Anxiety    Arthritis    "everywhere" - big toes worst   Chronic atrial fibrillation (HCC)    a. on eliquis; b. 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   Seasonal allergies    takes allergy weekly   Status post bilateral breast reduction 09/06/2016   Overview:  08/29/16    Past Surgical History:  Procedure Laterality Date   BREAST REDUCTION SURGERY Bilateral 08/29/2016   Procedure: BILATERAL MAMMARY REDUCTION  (BREAST)WITH LIPOSUCTION;  Surgeon: Peggye Form, DO;  Location: [facility];  Service: Plastics;  Laterality: Bilateral;   CATARACT EXTRACTION W/PHACO Left 05/03/2015   Procedure: CATARACT EXTRACTION PHACO AND INTRAOCULAR LENS PLACEMENT (IOC);  Surgeon: Lockie Mola, MD;  Location: [facility];  Service: Ophthalmology;  Laterality: Left;  CPAP   CATARACT EXTRACTION W/PHACO Right 10/09/2016   Procedure: CATARACT EXTRACTION PHACO AND INTRAOCULAR LENS PLACEMENT (IOC);  Surgeon: Lockie Mola, MD;  Location: [facility];  Service: Ophthalmology;  Laterality: Right;  sleep apnea   CHONDROPLASTY Right 04/29/2016   Procedure: CHONDROPLASTY;  Surgeon: Donato Heinz, MD;  Location: [facility];  Service: Orthopedics;  Laterality: Right;   COLONOSCOPY WITH PROPOFOL N/A 10/24/2018   Procedure: COLONOSCOPY WITH PROPOFOL;  Surgeon: Pasty Spillers, MD;  Location: [facility];  Service: Endoscopy;  Laterality: N/A;   EYE SURGERY     KNEE ARTHROPLASTY Right 03/25/2018   Procedure: COMPUTER ASSISTED TOTAL KNEE ARTHROPLASTY;  Surgeon: Donato Heinz, MD;  Location: [facility];  Service: Orthopedics;   Laterality: Right;   KNEE ARTHROSCOPY WITH LATERAL MENISECTOMY  04/29/2016   Procedure: KNEE ARTHROSCOPY WITH LATERAL MENISECTOMY;  Surgeon: Donato Heinz, MD;  Location: [facility];  Service: Orthopedics;;   KNEE ARTHROSCOPY WITH MEDIAL MENISECTOMY  04/29/2016   Procedure: KNEE ARTHROSCOPY WITH MEDIAL MENISECTOMY;  Surgeon: Donato Heinz, MD;  Location: [facility];  Service: Orthopedics;;   REDUCTION MAMMAPLASTY Bilateral 09/2016   RETINAL DETACHMENT SURGERY Left May 03, 2015   Dr. Inez Pilgrim, [facility]   TUBAL LIGATION       Current Outpatient Medications  Medication Sig Dispense Refill   amLODipine (NORVASC) 2.5 MG tablet Take 1 tablet (2.5 mg total) by mouth daily. 90 tablet 1   apixaban (ELIQUIS) 5 MG TABS tablet TAKE 1 TABLET BY MOUTH TWICE A DAY 60 tablet 6   atorvastatin (LIPITOR) 80 MG tablet Take 1 tablet (80 mg total) by mouth at bedtime. 90 tablet 1   azelastine (OPTIVAR) 0.05 % ophthalmic solution Place 1 drop into both eyes 2 (two) times daily.     B Complex-C (B-COMPLEX WITH VITAMIN C) tablet Take 1 tablet by mouth daily.     fluticasone (FLONASE) 50 MCG/ACT nasal spray Place 2 sprays into both nostrils daily. 16 g 0   GLUCOSAMINE-CHONDROITIN DS PO Take 3,000 mg by mouth daily.     losartan (COZAAR) 25 MG tablet Take 1 tablet (25 mg total) by mouth daily. 90 tablet 0   Magnesium 250 MG TABS Take 250 mg by mouth daily.     montelukast (SINGULAIR) 10 MG tablet Take 1 tablet (10 mg total) by mouth at bedtime. 90 tablet 0   Multiple Vitamin (MULTIVITAMIN WITH MINERALS) TABS tablet Take 1 tablet by mouth daily. One-A-Day Active 65+     nystatin cream (MYCOSTATIN) Apply 1 application topically 2 (two) times daily. 30 g 0   sertraline (ZOLOFT) 50 MG tablet TAKE 1 TABLET BY MOUTH EVERY DAY 90 tablet 1   triamcinolone cream (KENALOG) 0.1 % Apply 1 application topically 2 (two) times daily. 30 g 0   No current facility-administered medications for this visit.    Allergies:   Ivp dye  [iodinated contrast media] and Tape    Social History:  The patient  reports that she quit smoking about 52 years ago. Her smoking use included cigarettes. She has a 0.50 pack-year smoking history. She has never used smokeless tobacco. She reports that she does not currently use alcohol. She reports that she does not use drugs.      ROS:  Please see the history of present illness.   Otherwise, review of systems are positive for none.   All other systems are reviewed and negative.    PHYSICAL EXAM: VS:  BP 140/80 (BP Location: Left Arm, Patient Position: Sitting, Cuff Size: Large)    Pulse 79    Ht 5\' 5"  (1.651 m)    Wt 245 lb 8 oz (111.4 kg)  SpO2 93%    BMI 40.85 kg/m  , BMI Body mass index is 40.85 kg/m. GEN: Well nourished, well developed, in no acute distress  HEENT: normal  Neck: no JVD, carotid bruits, or masses Cardiac: Irregularly irregular; no rubs, or gallops,no edema .  1 out of 6 systolic murmur in the aortic area Respiratory:  clear to auscultation bilaterally, normal work of breathing GI: soft, nontender, nondistended, + BS MS: no deformity or atrophy  Skin: warm and dry, no rash Neuro:  Strength and sensation are intact Psych: euthymic mood, full affect Radial pulses normal bilaterally   EKG:  EKG  ordered today. EKG showed atrial fibrillation with low voltage and poor R wave progression in the anterior leads.  Recent Labs: 11/02/2021: ALT 16; BUN 14; Creatinine, Ser 0.94; Potassium 4.4; Sodium 141 11/16/2021: Hemoglobin 13.4; Platelets 173.0; TSH 4.20    Lipid Panel    Component Value Date/Time   CHOL 94 09/20/2020 0632   CHOL 186 09/04/2015 1051   TRIG 90 09/20/2020 0632   HDL 43 09/20/2020 0632   HDL 66 09/04/2015 1051   CHOLHDL 2.2 09/20/2020 0632   VLDL 18 09/20/2020 0632   LDLCALC 33 09/20/2020 0632   LDLCALC 99 09/04/2015 1051   LDLDIRECT 57.0 10/31/2020 1054      Wt Readings from Last 3 Encounters:  12/10/21 245 lb 8 oz (111.4 kg)  12/07/21  249 lb (112.9 kg)  11/16/21 246 lb 6.4 oz (111.8 kg)        ASSESSMENT AND PLAN:  1.  Chronic atrial fibrillation: Ventricular rate is controlled without any medication.  Ventricular rate was also well controlled on recent outpatient ZIO monitor.  She is tolerating anticoagulation with Eliquis.    2.  Essential hypertension: Her blood pressure improved with the addition of small dose amlodipine.  3.   left MCA stroke: No recurrent symptoms.  Continue medical therapy.  4.  Hyperlipidemia: Continue high-dose atorvastatin.  Most recent lipid profile in January showed an LDL of 57.    5.  Concerns about subclavian stenosis: The patient was noted to have different blood pressure in both arms but it was still less than 10 mmHg.  In addition, her radial pulses are equal bilaterally and she has no convincing symptoms of subclavian steal syndrome.  Suspect that she had vertigo recently that has resolved since then.  No further work-up of this is needed at the present time.   Disposition:   FU with me in 6 months.  Signed,  Lorine Bears, MD  12/10/2021 1:55 PM    [facility]

## 2021-12-10 NOTE — Patient Instructions (Signed)
Medication Instructions:   Your physician recommends that you continue on your current medications as directed. Please refer to the Current Medication list given to you today.  *If you need a refill on your cardiac medications before your next appointment, please call your pharmacy*   Lab Work:  None ordered  Testing/Procedures:  None ordered   Follow-Up: At [facility], you and your health needs are our priority.  As part of our continuing mission to provide you with exceptional heart care, we have created designated Provider Care Teams.  These Care Teams include your primary Cardiologist (physician) and Advanced Practice Providers (APPs -  Physician Assistants and Nurse Practitioners) who all work together to provide you with the care you need, when you need it.  We recommend signing up for the patient portal called "MyChart".  Sign up information is provided on this After Visit Summary.  MyChart is used to connect with patients for Virtual Visits (Telemedicine).  Patients are able to view lab/test results, encounter notes, upcoming appointments, etc.  Non-urgent messages can be sent to your provider as well.   To learn more about what you can do with MyChart, go to [email or web address].    Your next appointment:   6 month(s)  The format for your next appointment:   In Person  Provider:   You may see Lorine Bears, MD or one of the following Advanced Practice Providers on your designated Care Team:   Nicolasa Ducking, NP Eula Listen, PA-C Cadence Fransico Michael, [place]

## 2021-12-11 ENCOUNTER — Ambulatory Visit: Payer: Medicare HMO | Admitting: Internal Medicine

## 2021-12-12 ENCOUNTER — Ambulatory Visit (INDEPENDENT_AMBULATORY_CARE_PROVIDER_SITE_OTHER): Payer: Medicare HMO

## 2021-12-12 VITALS — Ht 65.0 in | Wt 245.0 lb

## 2021-12-12 DIAGNOSIS — Z0000 Encounter for general adult medical examination without abnormal findings: Secondary | ICD-10-CM

## 2021-12-12 NOTE — Progress Notes (Addendum)
Subjective:   Crystal Haas is a 76 y.o. female who presents for Medicare Annual (Subsequent) preventive examination.  Review of Systems    No ROS.  Medicare Wellness Virtual Visit.  Visual/audio telehealth visit, UTA vital signs.   See social history for additional risk factors.   Cardiac Risk Factors include: advanced age (>65men, >44 women)     Objective:    Today's Vitals   12/12/21 1404  Weight: 245 lb (111.1 kg)  Height: 5\' 5"  (1.651 m)   Body mass index is 40.77 kg/m.  Advanced Directives 12/12/2021 04/10/2021 12/11/2020 09/19/2020 09/19/2020 09/09/2020 04/28/2020  Does Patient Have a Medical Advance Directive? Yes No Yes No No No Yes  Type of 06/29/2020 of [place];Living will - Healthcare Power of [place];Living will - - - Living will  Does patient want to make changes to medical advance directive? No - Patient declined - No - Patient declined - - - -  Copy of Healthcare Power of Attorney in Chart? Yes - validated most recent copy scanned in chart (See row information) - Yes - validated most recent copy scanned in chart (See row information) - - - -  Would patient like information on creating a medical advance directive? - - - No - Patient declined - - -   Current Medications (verified) Outpatient Encounter Medications as of 12/12/2021  Medication Sig   amLODipine (NORVASC) 2.5 MG tablet Take 1 tablet (2.5 mg total) by mouth daily.   apixaban (ELIQUIS) 5 MG TABS tablet TAKE 1 TABLET BY MOUTH TWICE A DAY   atorvastatin (LIPITOR) 80 MG tablet Take 1 tablet (80 mg total) by mouth at bedtime.   azelastine (OPTIVAR) 0.05 % ophthalmic solution Place 1 drop into both eyes 2 (two) times daily.   B Complex-C (B-COMPLEX WITH VITAMIN C) tablet Take 1 tablet by mouth daily.   fluticasone (FLONASE) 50 MCG/ACT nasal spray Place 2 sprays into both nostrils daily.   GLUCOSAMINE-CHONDROITIN DS PO Take 3,000 mg by mouth daily.   losartan (COZAAR) 25 MG tablet Take 1  tablet (25 mg total) by mouth daily.   Magnesium 250 MG TABS Take 250 mg by mouth daily.   montelukast (SINGULAIR) 10 MG tablet Take 1 tablet (10 mg total) by mouth at bedtime.   Multiple Vitamin (MULTIVITAMIN WITH MINERALS) TABS tablet Take 1 tablet by mouth daily. One-A-Day Active 65+   nystatin cream (MYCOSTATIN) Apply 1 application topically 2 (two) times daily.   sertraline (ZOLOFT) 50 MG tablet TAKE 1 TABLET BY MOUTH EVERY DAY   triamcinolone cream (KENALOG) 0.1 % Apply 1 application topically 2 (two) times daily.   No facility-administered encounter medications on file as of 12/12/2021.   Allergies (verified) Ivp dye [iodinated contrast media] and Tape   History: Past Medical History:  Diagnosis Date   Anemia    distant past   Anxiety    Arthritis    "everywhere" - big toes worst   Chronic atrial fibrillation (HCC)    a. on eliquis; b. 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   Seasonal allergies    takes allergy weekly   Status post bilateral  breast reduction 09/06/2016   Overview:  08/29/16   Past Surgical History:  Procedure Laterality Date   BREAST REDUCTION SURGERY Bilateral 08/29/2016   Procedure: BILATERAL MAMMARY REDUCTION  (BREAST)WITH LIPOSUCTION;  Surgeon: Peggye Formlaire S Dillingham, DO;  Location: [facility];  Service: Plastics;  Laterality: Bilateral;   CATARACT EXTRACTION W/PHACO Left 05/03/2015   Procedure: CATARACT EXTRACTION PHACO AND INTRAOCULAR LENS PLACEMENT (IOC);  Surgeon: Lockie Molahadwick Brasington, MD;  [facility];  Service: Ophthalmology;  Laterality: Left;  CPAP   CATARACT EXTRACTION W/PHACO Right 10/09/2016   Procedure: CATARACT EXTRACTION PHACO AND INTRAOCULAR LENS PLACEMENT (IOC);  Surgeon: Lockie Molahadwick  Brasington, MD;  [facility];  Service: Ophthalmology;  Laterality: Right;  sleep apnea   CHONDROPLASTY Right 04/29/2016   Procedure: CHONDROPLASTY;  Surgeon: Donato HeinzJames P Hooten, MD;  Location: [facility];  Service: Orthopedics;  Laterality: Right;   COLONOSCOPY WITH PROPOFOL N/A 10/24/2018   Procedure: COLONOSCOPY WITH PROPOFOL;  Surgeon: Pasty Spillersahiliani, Varnita B, MD;  Location: [facility];  Service: Endoscopy;  Laterality: N/A;   EYE SURGERY     KNEE ARTHROPLASTY Right 03/25/2018   Procedure: COMPUTER ASSISTED TOTAL KNEE ARTHROPLASTY;  Surgeon: Donato HeinzHooten, James P, MD;  Location: [facility];  Service: Orthopedics;  Laterality: Right;   KNEE ARTHROSCOPY WITH LATERAL MENISECTOMY  04/29/2016   Procedure: KNEE ARTHROSCOPY WITH LATERAL MENISECTOMY;  Surgeon: Donato HeinzJames P Hooten, MD;  Location: [facility];  Service: Orthopedics;;   KNEE ARTHROSCOPY WITH MEDIAL MENISECTOMY  04/29/2016   Procedure: KNEE ARTHROSCOPY WITH MEDIAL MENISECTOMY;  Surgeon: Donato HeinzJames P Hooten, MD;  Location: [facility];  Service: Orthopedics;;   REDUCTION MAMMAPLASTY Bilateral 09/2016   RETINAL DETACHMENT SURGERY Left May 03, 2015   [facility]   TUBAL LIGATION     Family History  Problem Relation Age of Onset   Alcoholism Other    Heart disease Other    Breast cancer Neg Hx    Social History   Socioeconomic History   Marital status: Widowed    Spouse name: Not on file   Number of children: Not on file   Years of education: Not on file   Highest education level: Not on file  Occupational History   Not on file  Tobacco Use   Smoking status: Former    Packs/day: 0.25    Years: 2.00    Pack years: 0.50    Types: Cigarettes    Quit date: 10/21/1969    Years since quitting: 52.1   Smokeless tobacco: Never  Vaping Use   Vaping Use: Never used  Substance and Sexual Activity   Alcohol use: Not Currently    Comment: occasional glass of wine   Drug use: No   Sexual activity: Not on file  Other Topics Concern    Not on file  Social History Narrative   Not on file   Social Determinants of Health   Financial Resource Strain: Not on file  Food Insecurity: Not on file  Transportation Needs: Not on file  Physical Activity: Not on file  Stress: Not on file  Social Connections: Not on file   Tobacco Counseling Counseling given: Not Answered  Clinical Intake:  Pre-visit preparation completed: Yes        Diabetes: No  How often do you need to have someone help you when you read instructions, pamphlets, or other written materials from your doctor or pharmacy?: 1 - Never  Interpreter Needed?: No    Activities of Daily Living In your present state of  health, do you have any difficulty performing the following activities: 12/12/2021  Hearing? N  Vision? N  Difficulty concentrating or making decisions? N  Walking or climbing stairs? N  Dressing or bathing? N  Doing errands, shopping? N  Preparing Food and eating ? N  Using the Toilet? N  In the past six months, have you accidently leaked urine? Y  Comment Managed with daily brief  Do you have problems with loss of bowel control? N  Managing your Medications? N  Managing your Finances? N  Housekeeping or managing your Housekeeping? N  Some recent data might be hidden   Patient Care Team: Sherlene Shams, MD as PCP - General (Internal Medicine) Iran Ouch, MD as PCP - Cardiology (Cardiology)  Indicate any recent Medical Services you may have received from other than [facility] providers in the past year (date may be approximate).     Assessment:   This is a routine wellness examination for Crystal Haas.  Virtual Visit via Telephone Note  I connected with  Crystal Haas on 12/12/21 at  2:00 PM EST by telephone and verified that I am speaking with the correct person using two identifiers.  Persons participating in the virtual visit: patient/Nurse Health Advisor   I discussed the limitations, risks, security and privacy concerns of  performing an evaluation and management service by telephone and the availability of in person appointments. The patient expressed understanding and agreed to proceed.  Interactive audio and video telecommunications were attempted between this nurse and patient, however failed, due to patient having technical difficulties OR patient did not have access to video capability.  We continued and completed visit with audio only.  Some vital signs may be absent or patient reported.   Hearing/Vision screen Hearing Screening - Comments:: Patient is able to hear conversational tones without difficulty. No issues reported. Vision Screening - Comments:: Wears corrective lenses Cataract extraction, bilateral  Glaucoma suspect; yes  They have seen their ophthalmologist in the last 6 months.   Dietary issues and exercise activities discussed: Current Exercise Habits: Home exercise routine, Intensity: Mild Regular diet Good water intake   Goals Addressed               This Visit's Progress     Patient Stated     Weight loss (pt-stated)        Increase physical activity Eat only when hungry         Depression Screen PHQ 2/9 Scores 12/12/2021 11/16/2021 11/02/2021 07/17/2021 12/11/2020 10/03/2020 10/12/2019  PHQ - 2 Score 0 0 1 0 0 0 1  PHQ- 9 Score - - - - - - -    Fall Risk Fall Risk  12/12/2021 12/07/2021 11/16/2021 11/02/2021 07/17/2021  Falls in the past year? 0 0 0 0 0  Number falls in past yr: 0 - - - 0  Injury with Fall? - - - - -  Risk for fall due to : - No Fall Risks No Fall Risks No Fall Risks -  Follow up Falls evaluation completed Falls evaluation completed Falls evaluation completed Falls evaluation completed Falls evaluation completed  Comment - - - - -   FALL RISK PREVENTION PERTAINING TO THE HOME: Home free of loose throw rugs in walkways, pet beds, electrical cords, etc? Yes  Adequate lighting in your home to reduce risk of falls? Yes   ASSISTIVE DEVICES UTILIZED TO  PREVENT FALLS: Use of a cane, walker or w/c? No   TIMED UP AND GO: Was  the test performed? No .   Cognitive Function: Patient is alert and oriented x3.  MMSE - Mini Mental State Exam 01/01/2017  Orientation to time 5  Orientation to Place 5  Registration 3  Attention/ Calculation 5  Recall 3  Language- name 2 objects 2  Language- repeat 1  Language- follow 3 step command 3  Language- read & follow direction 1  Write a sentence 1  Copy design 1  Total score 30     6CIT Screen 10/12/2019  What Year? 0 points  What month? 0 points  What time? 0 points  Count back from 20 0 points  Months in reverse 2 points  Repeat phrase 0 points  Total Score 2    Immunizations Immunization History  Administered Date(s) Administered   Fluad Quad(high Dose 65+) 07/26/2019, 09/20/2020, 07/17/2021   Influenza, High Dose Seasonal PF 08/30/2015, 08/06/2016, 08/14/2017, 11/03/2018   Moderna Sars-Covid-2 Vaccination 12/08/2019, 01/18/2020   Pneumococcal Conjugate-13 01/01/2013   Pneumococcal Polysaccharide-23 01/01/2014   Td 12/15/2020   Zoster Recombinat (Shingrix) 01/02/2016   Shingrix Completed?: No.    Education has been provided regarding the importance of this vaccine. Patient has been advised to call insurance company to determine out of pocket expense if they have not yet received this vaccine. Advised may also receive vaccine at local pharmacy or Health Dept. Verbalized acceptance and understanding.  Screening Tests Health Maintenance  Topic Date Due   Zoster Vaccines- Shingrix (2 of 2) 03/11/2022 (Originally 02/27/2016)   MAMMOGRAM  06/27/2023   COLONOSCOPY (Pts 45-64yrs Insurance coverage will need to be confirmed)  10/24/2028   TETANUS/TDAP  12/15/2030   Pneumonia Vaccine 7+ Years old  Completed   INFLUENZA VACCINE  Completed   DEXA SCAN  Completed   Hepatitis C Screening  Completed   HPV VACCINES  Aged Out   COVID-19 Vaccine  Discontinued   Health Maintenance There are  no preventive care reminders to display for this patient.  Lung Cancer Screening: (Low Dose CT Chest recommended if Age 75-80 years, 30 pack-year currently smoking OR have quit w/in 15years.) does not qualify.   Vision Screening: Recommended annual ophthalmology exams for early detection of glaucoma and other disorders of the eye.  Dental Screening: Recommended annual dental exams for proper oral hygiene  Community Resource Referral / Chronic Care Management: CRR required this visit?  No   CCM required this visit?  No      Plan:   Keep all routine maintenance appointments.   I have personally reviewed and noted the following in the patients chart:   Medical and social history Use of alcohol, tobacco or illicit drugs  Current medications and supplements including opioid prescriptions.  Functional ability and status Nutritional status Physical activity Advanced directives List of other physicians Hospitalizations, surgeries, and ER visits in previous 12 months Vitals Screenings to include cognitive, depression, and falls Referrals and appointments  In addition, I have reviewed and discussed with patient certain preventive protocols, quality metrics, and best practice recommendations. A written personalized care plan for preventive services as well as general preventive health recommendations were provided to patient.     OBrien-Blaney, Crystal Manring L, LPN   [id number]/[id number]    I have reviewed the above information and agree with above.   Duncan Dull, MD

## 2021-12-12 NOTE — Patient Instructions (Addendum)
[name] , Thank you for taking time to come for your Medicare Wellness Visit. I appreciate your ongoing commitment to your health goals. Please review the following plan we discussed and let me know if I can assist you in the future.   These are the goals we discussed:  Goals       Patient Stated     Weight loss (pt-stated)      Increase physical activity Eat only when hungry          This is a list of the screening recommended for you and due dates:  Health Maintenance  Topic Date Due   Zoster (Shingles) Vaccine (2 of 2) 03/11/2022*   Mammogram  06/27/2023   Colon Cancer Screening  10/24/2028   Tetanus Vaccine  12/15/2030   Pneumonia Vaccine  Completed   Flu Shot  Completed   DEXA scan (bone density measurement)  Completed   Hepatitis C Screening: USPSTF Recommendation to screen - Ages 12-79 yo.  Completed   HPV Vaccine  Aged Out   COVID-19 Vaccine  Discontinued  *Topic was postponed. The date shown is not the original due date.   Advanced directives: on file  Conditions/risks identified: none new  Follow up in one year for your annual wellness visit    Preventive Care 65 Years and Older, Female Preventive care refers to lifestyle choices and visits with your health care provider that can promote health and wellness. What does preventive care include? A yearly physical exam. This is also called an annual well check. Dental exams once or twice a year. Routine eye exams. Ask your health care provider how often you should have your eyes checked. Personal lifestyle choices, including: Daily care of your teeth and gums. Regular physical activity. Eating a healthy diet. Avoiding tobacco and drug use. Limiting alcohol use. Practicing safe sex. Taking low-dose aspirin every day. Taking vitamin and mineral supplements as recommended by your health care provider. What happens during an annual well check? The services and screenings done by your health care provider  during your annual well check will depend on your age, overall health, lifestyle risk factors, and family history of disease. Counseling  Your health care provider may ask you questions about your: Alcohol use. Tobacco use. Drug use. Emotional well-being. Home and relationship well-being. Sexual activity. Eating habits. History of falls. Memory and ability to understand (cognition). Work and work Astronomer. Reproductive health. Screening  You may have the following tests or measurements: Height, weight, and BMI. Blood pressure. Lipid and cholesterol levels. These may be checked every 5 years, or more frequently if you are over 8 years old. Skin check. Lung cancer screening. You may have this screening every year starting at age 46 if you have a 30-pack-year history of smoking and currently smoke or have quit within the past 15 years. Fecal occult blood test (FOBT) of the stool. You may have this test every year starting at age 60. Flexible sigmoidoscopy or colonoscopy. You may have a sigmoidoscopy every 5 years or a colonoscopy every 10 years starting at age 11. Hepatitis C blood test. Hepatitis B blood test. Sexually transmitted disease (STD) testing. Diabetes screening. This is done by checking your blood sugar (glucose) after you have not eaten for a while (fasting). You may have this done every 1-3 years. Bone density scan. This is done to screen for osteoporosis. You may have this done starting at age 75. Mammogram. This may be done every 1-2 years. Talk to your  health care provider about how often you should have regular mammograms. Talk with your health care provider about your test results, treatment options, and if necessary, the need for more tests. Vaccines  Your health care provider may recommend certain vaccines, such as: Influenza vaccine. This is recommended every year. Tetanus, diphtheria, and acellular pertussis (Tdap, Td) vaccine. You may need a Td booster every  10 years. Zoster vaccine. You may need this after age 64. Pneumococcal 13-valent conjugate (PCV13) vaccine. One dose is recommended after age 4. Pneumococcal polysaccharide (PPSV23) vaccine. One dose is recommended after age 57. Talk to your health care provider about which screenings and vaccines you need and how often you need them. This information is not intended to replace advice given to you by your health care provider. Make sure you discuss any questions you have with your health care provider. Document Released: 11/03/2015 Document Revised: 06/26/2016 Document Reviewed: 08/08/2015 Elsevier Interactive Patient Education  2017 [place] Prevention in the Home Falls can cause injuries. They can happen to people of all ages. There are many things you can do to make your home safe and to help prevent falls. What can I do on the outside of my home? Regularly fix the edges of walkways and driveways and fix any cracks. Remove anything that might make you trip as you walk through a door, such as a raised step or threshold. Trim any bushes or trees on the path to your home. Use bright outdoor lighting. Clear any walking paths of anything that might make someone trip, such as rocks or tools. Regularly check to see if handrails are loose or broken. Make sure that both sides of any steps have handrails. Any raised decks and porches should have guardrails on the edges. Have any leaves, snow, or ice cleared regularly. Use sand or salt on walking paths during winter. Clean up any spills in your garage right away. This includes oil or grease spills. What can I do in the bathroom? Use night lights. Install grab bars by the toilet and in the tub and shower. Do not use towel bars as grab bars. Use non-skid mats or decals in the tub or shower. If you need to sit down in the shower, use a plastic, non-slip stool. Keep the floor dry. Clean up any water that spills on the floor as soon as it  happens. Remove soap buildup in the tub or shower regularly. Attach bath mats securely with double-sided non-slip rug tape. Do not have throw rugs and other things on the floor that can make you trip. What can I do in the bedroom? Use night lights. Make sure that you have a light by your bed that is easy to reach. Do not use any sheets or blankets that are too big for your bed. They should not hang down onto the floor. Have a firm chair that has side arms. You can use this for support while you get dressed. Do not have throw rugs and other things on the floor that can make you trip. What can I do in the kitchen? Clean up any spills right away. Avoid walking on wet floors. Keep items that you use a lot in easy-to-reach places. If you need to reach something above you, use a strong step stool that has a grab bar. Keep electrical cords out of the way. Do not use floor polish or wax that makes floors slippery. If you must use wax, use non-skid floor wax. Do not have  throw rugs and other things on the floor that can make you trip. What can I do with my stairs? Do not leave any items on the stairs. Make sure that there are handrails on both sides of the stairs and use them. Fix handrails that are broken or loose. Make sure that handrails are as long as the stairways. Check any carpeting to make sure that it is firmly attached to the stairs. Fix any carpet that is loose or worn. Avoid having throw rugs at the top or bottom of the stairs. If you do have throw rugs, attach them to the floor with carpet tape. Make sure that you have a light switch at the top of the stairs and the bottom of the stairs. If you do not have them, ask someone to add them for you. What else can I do to help prevent falls? Wear shoes that: Do not have high heels. Have rubber bottoms. Are comfortable and fit you well. Are closed at the toe. Do not wear sandals. If you use a stepladder: Make sure that it is fully opened.  Do not climb a closed stepladder. Make sure that both sides of the stepladder are locked into place. Ask someone to hold it for you, if possible. Clearly mark and make sure that you can see: Any grab bars or handrails. First and last steps. Where the edge of each step is. Use tools that help you move around (mobility aids) if they are needed. These include: Canes. Walkers. Scooters. Crutches. Turn on the lights when you go into a dark area. Replace any light bulbs as soon as they burn out. Set up your furniture so you have a clear path. Avoid moving your furniture around. If any of your floors are uneven, fix them. If there are any pets around you, be aware of where they are. Review your medicines with your doctor. Some medicines can make you feel dizzy. This can increase your chance of falling. Ask your doctor what other things that you can do to help prevent falls. This information is not intended to replace advice given to you by your health care provider. Make sure you discuss any questions you have with your health care provider. Document Released: 08/03/2009 Document Revised: 03/14/2016 Document Reviewed: 11/11/2014 Elsevier Interactive Patient Education  2017 [employer].

## 2021-12-13 DIAGNOSIS — G4733 Obstructive sleep apnea (adult) (pediatric): Secondary | ICD-10-CM | POA: Diagnosis not present

## 2021-12-17 DIAGNOSIS — H5203 Hypermetropia, bilateral: Secondary | ICD-10-CM | POA: Diagnosis not present

## 2021-12-18 ENCOUNTER — Ambulatory Visit: Payer: Medicare HMO | Admitting: Cardiovascular Disease

## 2021-12-25 DIAGNOSIS — Z0100 Encounter for examination of eyes and vision without abnormal findings: Secondary | ICD-10-CM | POA: Diagnosis not present

## 2022-01-07 ENCOUNTER — Other Ambulatory Visit: Payer: Self-pay | Admitting: Family Medicine

## 2022-01-07 DIAGNOSIS — J309 Allergic rhinitis, unspecified: Secondary | ICD-10-CM

## 2022-01-07 DIAGNOSIS — I10 Essential (primary) hypertension: Secondary | ICD-10-CM

## 2022-01-10 DIAGNOSIS — G4733 Obstructive sleep apnea (adult) (pediatric): Secondary | ICD-10-CM | POA: Diagnosis not present

## 2022-01-14 ENCOUNTER — Ambulatory Visit: Payer: Medicare HMO | Admitting: Family Medicine

## 2022-01-16 ENCOUNTER — Other Ambulatory Visit: Payer: Self-pay | Admitting: Cardiovascular Disease

## 2022-01-16 DIAGNOSIS — I482 Chronic atrial fibrillation, unspecified: Secondary | ICD-10-CM

## 2022-01-16 DIAGNOSIS — Z7901 Long term (current) use of anticoagulants: Secondary | ICD-10-CM

## 2022-01-17 MED ORDER — APIXABAN 5 MG PO TABS: 5.0000 mg | ORAL_TABLET | Freq: Two times a day (BID) | ORAL | 11 refills | Status: DC

## 2022-01-17 NOTE — Addendum Note (Signed)
Addended by: Memory Dance on: 01/17/2022 11:42 AM ? ? Modules accepted: Orders ? ?

## 2022-01-17 NOTE — Telephone Encounter (Signed)
Prescription refill request for Eliquis received. ?Indication: Atrial Fib ?Last office visit: 12/10/21  Terrilee Files MD ?Scr: 0.94 on 11/02/21 ?Age: 76 ?Weight: 111.4kg ? ?Based on above findings Eliquis 5mg  twice daily is the appropriate dose.  Refill approved. ? ?

## 2022-03-13 DIAGNOSIS — H31002 Unspecified chorioretinal scars, left eye: Secondary | ICD-10-CM | POA: Diagnosis not present

## 2022-03-27 ENCOUNTER — Telehealth: Payer: Self-pay | Admitting: Internal Medicine

## 2022-03-27 NOTE — Telephone Encounter (Signed)
Patient called and stated she had lower rt abdominal pain for 3 days. No appointments at time of call. Patient was transferred to Access Nurse.

## 2022-03-27 NOTE — Telephone Encounter (Signed)
Pt is scheduled with Dr. Tracy tomorrow.  

## 2022-03-27 NOTE — Telephone Encounter (Signed)
Appointment scheduled for tomorrow with Dr. Olivia Mackie McLean-Scocuzza.

## 2022-03-28 ENCOUNTER — Encounter: Payer: Self-pay | Admitting: Internal Medicine

## 2022-03-28 ENCOUNTER — Ambulatory Visit (INDEPENDENT_AMBULATORY_CARE_PROVIDER_SITE_OTHER): Payer: Medicare HMO | Admitting: Internal Medicine

## 2022-03-28 ENCOUNTER — Ambulatory Visit
Admission: RE | Admit: 2022-03-28 | Discharge: 2022-03-28 | Disposition: A | Discharge status: Home / Self Care | Payer: Medicare HMO | Source: Ambulatory Visit | Attending: Internal Medicine | Admitting: Internal Medicine

## 2022-03-28 ENCOUNTER — Ambulatory Visit (INDEPENDENT_AMBULATORY_CARE_PROVIDER_SITE_OTHER): Payer: Medicare HMO

## 2022-03-28 VITALS — BP 130/70 | HR 70 | Temp 97.9°F | Resp 14 | Ht 65.0 in | Wt 248.6 lb

## 2022-03-28 DIAGNOSIS — R1030 Lower abdominal pain, unspecified: Secondary | ICD-10-CM

## 2022-03-28 DIAGNOSIS — R159 Full incontinence of feces: Secondary | ICD-10-CM | POA: Diagnosis not present

## 2022-03-28 DIAGNOSIS — S41112A Laceration without foreign body of left upper arm, initial encounter: Secondary | ICD-10-CM

## 2022-03-28 DIAGNOSIS — R1031 Right lower quadrant pain: Secondary | ICD-10-CM

## 2022-03-28 DIAGNOSIS — M47816 Spondylosis without myelopathy or radiculopathy, lumbar region: Secondary | ICD-10-CM | POA: Diagnosis not present

## 2022-03-28 DIAGNOSIS — R195 Other fecal abnormalities: Secondary | ICD-10-CM

## 2022-03-28 DIAGNOSIS — R109 Unspecified abdominal pain: Secondary | ICD-10-CM | POA: Diagnosis not present

## 2022-03-28 DIAGNOSIS — M545 Low back pain, unspecified: Secondary | ICD-10-CM | POA: Diagnosis not present

## 2022-03-28 DIAGNOSIS — M25551 Pain in right hip: Secondary | ICD-10-CM | POA: Diagnosis not present

## 2022-03-28 DIAGNOSIS — M5136 Other intervertebral disc degeneration, lumbar region: Secondary | ICD-10-CM | POA: Diagnosis not present

## 2022-03-28 LAB — COMPREHENSIVE METABOLIC PANEL
ALT: 15 U/L (ref 0–35)
AST: 15 U/L (ref 0–37)
Albumin: 4.2 g/dL (ref 3.5–5.2)
Alkaline Phosphatase: 54 U/L (ref 39–117)
BUN: 13 mg/dL (ref 6–23)
CO2: 31 mEq/L (ref 19–32)
Calcium: 9.4 mg/dL (ref 8.4–10.5)
Chloride: 103 mEq/L (ref 96–112)
Creatinine, Ser: 0.82 mg/dL (ref 0.40–1.20)
GFR: 70.34 mL/min (ref >60.00)
Glucose, Bld: 101 mg/dL — ABNORMAL HIGH (ref 70–99)
Potassium: 4.6 mEq/L (ref 3.5–5.1)
Sodium: 141 mEq/L (ref 135–145)
Total Bilirubin: 0.9 mg/dL (ref 0.2–1.2)
Total Protein: 6.5 g/dL (ref 6.0–8.3)

## 2022-03-28 LAB — CBC WITH DIFFERENTIAL/PLATELET
Basophils Absolute: 0.1 10*3/uL (ref 0.0–0.1)
Basophils Relative: 1.3 % (ref 0.0–3.0)
Eosinophils Absolute: 0.3 10*3/uL (ref 0.0–0.7)
Eosinophils Relative: 4.7 % (ref 0.0–5.0)
HCT: 39.9 % (ref 36.0–46.0)
Hemoglobin: 13.1 g/dL (ref 12.0–15.0)
Lymphocytes Relative: 17.9 % (ref 12.0–46.0)
Lymphs Abs: 1.2 10*3/uL (ref 0.7–4.0)
MCHC: 32.8 g/dL (ref 30.0–36.0)
MCV: 91.5 fl (ref 78.0–100.0)
Monocytes Absolute: 0.9 10*3/uL (ref 0.1–1.0)
Monocytes Relative: 13.6 % — ABNORMAL HIGH (ref 3.0–12.0)
Neutro Abs: 4.3 10*3/uL (ref 1.4–7.7)
Neutrophils Relative %: 62.5 % (ref 43.0–77.0)
Platelets: 151 10*3/uL (ref 150.0–400.0)
RBC: 4.36 Mil/uL (ref 3.87–5.11)
RDW: 14.8 % (ref 11.5–15.5)
WBC: 6.9 10*3/uL (ref 4.0–10.5)

## 2022-03-28 MED ORDER — MUPIROCIN 2 % EX OINT: 1.0000 "application " | TOPICAL_OINTMENT | Freq: Two times a day (BID) | CUTANEOUS | 0 refills | Status: DC

## 2022-03-28 MED ORDER — HYDROCODONE-ACETAMINOPHEN 5-325 MG PO TABS: 1.0000 | ORAL_TABLET | Freq: Two times a day (BID) | ORAL | 0 refills | Status: DC | PRN

## 2022-03-28 NOTE — Patient Instructions (Signed)
[facility] will call for appt  Phone Fax E-mail Address  [phone] Not available [place] [phone]     Specialties      Abdominal Pain, Adult Pain in the abdomen (abdominal pain) can be caused by many things. Often, abdominal pain is not serious and it gets better with no treatment or by being treated at home. However, sometimes abdominal pain is serious. Your health care provider will ask questions about your medical history and do a physical exam to try to determine the cause of your abdominal pain. Follow these instructions at home: Medicines Take over-the-counter and prescription medicines only as told by your health care provider. Do not take a laxative unless told by your health care provider. General instructions  Watch your condition for any changes. Drink enough fluid to keep your urine pale yellow. Keep all follow-up visits as told by your health care provider. This is important. Contact a health care provider if: Your abdominal pain changes or gets worse. You are not hungry or you lose weight without trying. You are constipated or have diarrhea for more than 2-3 days. You have pain when you urinate or have a bowel movement. Your abdominal pain wakes you up at night. Your pain gets worse with meals, after eating, or with certain foods. You are vomiting and cannot keep anything down. You have a fever. You have blood in your urine. Get help right away if: Your pain does not go away as soon as your health care provider told you to expect. You cannot stop vomiting. Your pain is only in areas of the abdomen, such as the right side or the left lower portion of the abdomen. Pain on the right side could be caused by appendicitis. You have bloody or black stools, or stools that look like tar. You have severe pain, cramping, or bloating in your abdomen. You have signs of dehydration, such as: Dark urine, very little urine, or  no urine. Cracked lips. Dry mouth. Sunken eyes. Sleepiness. Weakness. You have trouble breathing or chest pain. Summary Often, abdominal pain is not serious and it gets better with no treatment or by being treated at home. However, sometimes abdominal pain is serious. Watch your condition for any changes. Take over-the-counter and prescription medicines only as told by your health care provider. Contact a health care provider if your abdominal pain changes or gets worse. Get help right away if you have severe pain, cramping, or bloating in your abdomen. This information is not intended to replace advice given to you by your health care provider. Make sure you discuss any questions you have with your health care provider. Document Revised: 11/26/2019 Document Reviewed: 02/15/2019 Elsevier Patient Education  [place].

## 2022-03-28 NOTE — Progress Notes (Addendum)
Chief Complaint  Patient presents with   Abdominal Pain    Pt c/o RLQ pain which is radiating to her lower back, ongoing x3 days. Denies any N/V/D or bloating, stools are soft but normal for pt, KGM:WNUU night. Pain is achy at times   F/u  1. Right lower back pain h/o multilevel arthritis and right hip pain radiating to groin as well 2. RLQ ab pain x 3 days worse with with walking standing ok no n/v/d stools having loose and fecal incontinence pain x 1-2 years and loose stools had colonoscopy in 2020. pain is achy not stabbing pain 4-5/10. Tried tylenol which helped with back pain #1  3. Laceration to left forearm x 3 days using otc antibiotic oint and has bandaid on it   Review of Systems  Constitutional:  Negative for weight loss.  HENT:  Negative for hearing loss.   Eyes:  Negative for blurred vision.  Respiratory:  Negative for shortness of breath.   Cardiovascular:  Negative for chest pain.  Gastrointestinal:  Positive for abdominal pain and diarrhea. Negative for blood in stool.  Genitourinary:  Negative for dysuria.  Musculoskeletal:  Positive for back pain and joint pain. Negative for falls.  Skin:  Negative for rash.  Neurological:  Negative for headaches.  Psychiatric/Behavioral:  Negative for depression.    Past Medical History:  Diagnosis Date   Anemia    distant past   Anxiety    Arthritis    "everywhere" - big toes worst   Chronic atrial fibrillation (HCC)    a. on eliquis; b. 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   Seasonal allergies    takes allergy weekly   Status post bilateral breast reduction 09/06/2016   Overview:  08/29/16   Past Surgical History:  Procedure Laterality Date   BREAST REDUCTION SURGERY Bilateral 08/29/2016    Procedure: BILATERAL MAMMARY REDUCTION  (BREAST)WITH LIPOSUCTION;  Surgeon: Peggye Form, DO;  Location: [facility];  Service: Plastics;  Laterality: Bilateral;   CATARACT EXTRACTION W/PHACO Left 05/03/2015   Procedure: CATARACT EXTRACTION PHACO AND INTRAOCULAR LENS PLACEMENT (IOC);  Surgeon: Lockie Mola, MD;  Location: [facility];  Service: Ophthalmology;  Laterality: Left;  CPAP   CATARACT EXTRACTION W/PHACO Right 10/09/2016   Procedure: CATARACT EXTRACTION PHACO AND INTRAOCULAR LENS PLACEMENT (IOC);  Surgeon: Lockie Mola, MD;  Location: [facility];  Service: Ophthalmology;  Laterality: Right;  sleep apnea   CHONDROPLASTY Right 04/29/2016   Procedure: CHONDROPLASTY;  Surgeon: Donato Heinz, MD;  Location: [facility];  Service: Orthopedics;  Laterality: Right;   COLONOSCOPY WITH PROPOFOL N/A 10/24/2018   Procedure: COLONOSCOPY WITH PROPOFOL;  Surgeon: Pasty Spillers, MD;  Location: [facility];  Service: Endoscopy;  Laterality: N/A;   EYE SURGERY     KNEE ARTHROPLASTY Right 03/25/2018   Procedure: COMPUTER ASSISTED TOTAL KNEE ARTHROPLASTY;  Surgeon: Donato Heinz, MD;  Location: [facility];  Service: Orthopedics;  Laterality: Right;   KNEE ARTHROSCOPY WITH LATERAL MENISECTOMY  04/29/2016   Procedure: KNEE ARTHROSCOPY WITH LATERAL MENISECTOMY;  Surgeon: Donato Heinz, MD;  Location: [facility];  Service: Orthopedics;;  KNEE ARTHROSCOPY WITH MEDIAL MENISECTOMY  04/29/2016   Procedure: KNEE ARTHROSCOPY WITH MEDIAL MENISECTOMY;  Surgeon: Dereck Leep, MD;  Location: [facility];  Service: Orthopedics;;   REDUCTION MAMMAPLASTY Bilateral 09/2016   RETINAL DETACHMENT SURGERY Left May 03, 2015   Dr. Wallace Going, [place]     Family History  Problem Relation Age of Onset   Alcoholism Other    Heart disease Other    Breast cancer Neg Hx    Social History   Socioeconomic History   Marital status: Widowed    Spouse name: Not on  file   Number of children: Not on file   Years of education: Not on file   Highest education level: Not on file  Occupational History   Not on file  Tobacco Use   Smoking status: Former    Packs/day: 0.25    Years: 2.00    Total pack years: 0.50    Types: Cigarettes    Quit date: 10/21/1969    Years since quitting: 52.4   Smokeless tobacco: Never  Vaping Use   Vaping Use: Never used  Substance and Sexual Activity   Alcohol use: Not Currently    Comment: occasional glass of wine   Drug use: No   Sexual activity: Not on file  Other Topics Concern   Not on file  Social History Narrative   Not on file   Social Determinants of Health   Financial Resource Strain: Not on file  Food Insecurity: Not on file  Transportation Needs: Not on file  Physical Activity: Not on file  Stress: Not on file  Social Connections: Not on file  Intimate Partner Violence: Not on file   Current Meds  Medication Sig   amLODipine (NORVASC) 2.5 MG tablet Take 1 tablet (2.5 mg total) by mouth daily.   apixaban (ELIQUIS) 5 MG TABS tablet Take 1 tablet (5 mg total) by mouth 2 (two) times daily.   atorvastatin (LIPITOR) 80 MG tablet Take 1 tablet (80 mg total) by mouth at bedtime.   azelastine (OPTIVAR) 0.05 % ophthalmic solution Place 1 drop into both eyes 2 (two) times daily.   B Complex-C (B-COMPLEX WITH VITAMIN C) tablet Take 1 tablet by mouth daily.   fluticasone (FLONASE) 50 MCG/ACT nasal spray Place 2 sprays into both nostrils daily.   GLUCOSAMINE-CHONDROITIN DS PO Take 3,000 mg by mouth daily.   HYDROcodone-acetaminophen (NORCO) 5-325 MG tablet Take 1 tablet by mouth 2 (two) times daily as needed for moderate pain.   losartan (COZAAR) 25 MG tablet TAKE 1 TABLET (25 MG TOTAL) BY MOUTH DAILY.   Magnesium 250 MG TABS Take 250 mg by mouth daily.   montelukast (SINGULAIR) 10 MG tablet TAKE 1 TABLET BY MOUTH EVERYDAY AT BEDTIME   Multiple Vitamin (MULTIVITAMIN WITH MINERALS) TABS tablet Take 1 tablet  by mouth daily. One-A-Day Active 65+   mupirocin ointment (BACTROBAN) 2 % Apply 1 application. topically 2 (two) times daily. Prn left arm   nystatin cream (MYCOSTATIN) Apply 1 application topically 2 (two) times daily.   sertraline (ZOLOFT) 50 MG tablet TAKE 1 TABLET BY MOUTH EVERY DAY   triamcinolone cream (KENALOG) 0.1 % Apply 1 application topically 2 (two) times daily.   Allergies  Allergen Reactions   Ivp Dye [Iodinated Contrast Media] Shortness Of Breath    Also swelling.  Topical betadine is OK.   Tape Other (See Comments)    Most tapes case raw skin.  Paper tape is OK.  No results found for this or any previous visit (from the past 2160 hour(s)). Objective  Body mass index is 41.37 kg/m. Wt Readings from Last 3 Encounters:  03/28/22 248 lb 9.6 oz (112.8 kg)  12/12/21 245 lb (111.1 kg)  12/10/21 245 lb 8 oz (111.4 kg)   Temp Readings from Last 3 Encounters:  03/28/22 97.9 F (36.6 C) (Oral)  12/07/21 98 F (36.7 C) (Oral)  11/16/21 97.7 F (36.5 C) (Oral)   BP Readings from Last 3 Encounters:  03/28/22 130/70  12/10/21 140/80  12/07/21 120/72   Pulse Readings from Last 3 Encounters:  03/28/22 70  12/10/21 79  12/07/21 60    Physical Exam Vitals and nursing note reviewed.  Constitutional:      Appearance: Normal appearance. She is well-developed and well-groomed.  HENT:     Head: Normocephalic and atraumatic.  Eyes:     Conjunctiva/sclera: Conjunctivae normal.     Pupils: Pupils are equal, round, and reactive to light.  Cardiovascular:     Rate and Rhythm: Normal rate. Rhythm irregular.     Heart sounds: Normal heart sounds. No murmur heard.    Comments: In Afib Pulmonary:     Effort: Pulmonary effort is normal.     Breath sounds: Normal breath sounds.  Abdominal:     General: Abdomen is flat. Bowel sounds are normal.     Tenderness: There is abdominal tenderness in the right lower quadrant.  Musculoskeletal:     Lumbar back: Tenderness present.        Back:     Right hip: Bony tenderness present.  Skin:    General: Skin is warm and dry.       Neurological:     General: No focal deficit present.     Mental Status: She is alert and oriented to person, place, and time. Mental status is at baseline.     Cranial Nerves: Cranial nerves 2-12 are intact.     Motor: Motor function is intact.     Coordination: Coordination is intact.     Gait: Gait is intact.  Psychiatric:        Attention and Perception: Attention and perception normal.        Mood and Affect: Mood and affect normal.        Speech: Speech normal.        Behavior: Behavior normal. Behavior is cooperative.        Thought Content: Thought content normal.        Cognition and Memory: Cognition and memory normal.        Judgment: Judgment normal.     Assessment  Plan  Lower abdominal pain no h/o kidney stones r/o stones vs other pt has contrast dye allergy ? Referred pain from [facility] back hip Xray narrowing, +arthritis low back- Plan: Comprehensive metabolic panel, CBC with Differential/Platelet, Urinalysis, Routine w reflex microscopic, Urine Culture, Ambulatory referral to Gastroenterology, CT ABDOMEN PELVIS WO CONTRAST, HYDROcodone-acetaminophen (NORCO) 5-325 MG tablet bid prn  Arthritis of lumbar spine - Plan: HYDROcodone-acetaminophen (NORCO) 5-325 MG tablet, DG Lumbar Spine Complete Pt will come in next week for depomedrol 40 mg x1  Right hip pain r/o arthritis - Plan: DG Hip Unilat W OR W/O Pelvis 2-3 Views Right  Right groin pain ? Referred abdomen vs [facility]- Plan: DG Hip Unilat W OR W/O Pelvis 2-3 Views Right, Ambulatory referral to Gastroenterology  Loose stools - Plan: Ambulatory referral to Gastroenterology Incontinence of feces, unspecified fecal incontinence type -  Plan: Ambulatory referral to Gastroenterology Poor prep in 2020 colonoscopy     Laceration of left upper extremity, initial encounter - Plan: mupirocin ointment (BACTROBAN) 2 %  Wash  antibacterial soap and cover with bandage Provider: Dr. Olivia Mackie McLean-Scocuzza-Internal Medicine

## 2022-03-29 LAB — URINE CULTURE
MICRO NUMBER:: 13500925
Result:: NO GROWTH
SPECIMEN QUALITY:: ADEQUATE

## 2022-03-29 LAB — URINALYSIS, ROUTINE W REFLEX MICROSCOPIC
Bilirubin Urine: NEGATIVE
Glucose, UA: NEGATIVE
Hgb urine dipstick: NEGATIVE
Ketones, ur: NEGATIVE
Leukocytes,Ua: NEGATIVE
Nitrite: NEGATIVE
Protein, ur: NEGATIVE
Specific Gravity, Urine: 1.007 (ref 1.001–1.035)
pH: 7.5 (ref 5.0–8.0)

## 2022-03-31 DIAGNOSIS — M25551 Pain in right hip: Secondary | ICD-10-CM | POA: Insufficient documentation

## 2022-03-31 DIAGNOSIS — M47816 Spondylosis without myelopathy or radiculopathy, lumbar region: Secondary | ICD-10-CM | POA: Insufficient documentation

## 2022-03-31 NOTE — Addendum Note (Signed)
Addended by: Quentin Ore on: 03/31/2022 09:32 PM   Modules accepted: Orders

## 2022-04-02 ENCOUNTER — Ambulatory Visit: Payer: Medicare HMO

## 2022-04-12 ENCOUNTER — Other Ambulatory Visit: Payer: Self-pay | Admitting: Family

## 2022-04-12 DIAGNOSIS — I10 Essential (primary) hypertension: Secondary | ICD-10-CM

## 2022-04-12 DIAGNOSIS — J309 Allergic rhinitis, unspecified: Secondary | ICD-10-CM

## 2022-04-17 ENCOUNTER — Ambulatory Visit: Payer: Medicare HMO | Admitting: Gastroenterology

## 2022-04-18 ENCOUNTER — Other Ambulatory Visit: Payer: Self-pay | Admitting: Internal Medicine

## 2022-04-18 ENCOUNTER — Other Ambulatory Visit: Payer: Self-pay | Admitting: Family Medicine

## 2022-04-18 DIAGNOSIS — E782 Mixed hyperlipidemia: Secondary | ICD-10-CM

## 2022-04-25 ENCOUNTER — Other Ambulatory Visit: Payer: Self-pay

## 2022-04-26 DIAGNOSIS — G4733 Obstructive sleep apnea (adult) (pediatric): Secondary | ICD-10-CM | POA: Diagnosis not present

## 2022-05-02 ENCOUNTER — Encounter: Payer: Self-pay | Admitting: Gastroenterology

## 2022-05-02 ENCOUNTER — Ambulatory Visit: Payer: Medicare HMO | Admitting: Gastroenterology

## 2022-05-02 ENCOUNTER — Telehealth: Payer: Self-pay | Admitting: Internal Medicine

## 2022-05-02 VITALS — BP 144/77 | HR 89 | Temp 98.2°F | Ht 65.0 in | Wt 248.5 lb

## 2022-05-02 DIAGNOSIS — R159 Full incontinence of feces: Secondary | ICD-10-CM

## 2022-05-02 DIAGNOSIS — R195 Other fecal abnormalities: Secondary | ICD-10-CM

## 2022-05-02 DIAGNOSIS — F981 Encopresis not due to a substance or known physiological condition: Secondary | ICD-10-CM | POA: Diagnosis not present

## 2022-05-02 DIAGNOSIS — R69 Illness, unspecified: Secondary | ICD-10-CM | POA: Diagnosis not present

## 2022-05-02 DIAGNOSIS — I10 Essential (primary) hypertension: Secondary | ICD-10-CM

## 2022-05-02 MED ORDER — LOSARTAN POTASSIUM 25 MG PO TABS: 25.0000 mg | ORAL_TABLET | Freq: Every day | ORAL | 1 refills | Status: DC

## 2022-05-02 NOTE — Progress Notes (Signed)
Arlyss Repress, MD [address]  [address]  [place], [place] [address]  Main: [phone]  Fax: [phone]    Gastroenterology Consultation  Referring Provider:     Sherlene Shams, MD Primary Care Physician:  Sherlene Shams, MD Primary Gastroenterologist:  Dr. Arlyss Repress Reason for Consultation: Stool leakage, loose stools        HPI:   Crystal Haas is a 76 y.o. female referred by Dr. Sherlene Shams, MD  for consultation & management of leakage of stool.  Patient reports that for almost 15 years, she has been experiencing loose nonbloody stools and she thought she had a nervous colon.  She underwent colonoscopy by Dr. Servando Snare in 2014 for colon cancer screening and chronic diarrhea, she did have sigmoid diverticulosis and random colon biopsies were negative for microscopic colitis.  Over the last 7 years, patient reports that she has been noticing that her undergarments are soiled with stool without her knowledge, she has stool leakage almost on a daily basis.  She cannot exercise any longer, cannot dance or go out.  Her social life has been significantly compromised because of stool leakage.  She describes her stools as soft and clumpy, Bristol stool scale 6.  She denies any rectal bleeding or rectal pain or discomfort.  She denies any abdominal cramps, bloating, postprandial urgency.  Patient denies any weight loss, rather gained weight since she stopped being physically active because of stool leakage.  She denies any nocturnal symptoms other than occasional stool leakage.  She denies any particular relation to food.  She denies consumption of carbonated beverages, artificial sweeteners, red meat.  She tries to eat healthy.  Patient takes magnesium tablets daily, started 2 months ago.  Symptoms have remained unchanged.  She also tried Imodium, Pepto-Bismol which did not help much.  Patient reports that the consistency of the stool has become more looser within last few  years.  NSAIDs: None  Antiplts/Anticoagulants/Anti thrombotics: Eliquis for history of A-fib  GI Procedures: Colonoscopy 10/2018, found to have sigmoid diverticulosis, internal hemorrhoids  Past Medical History:  Diagnosis Date   Anemia    distant past   Anxiety    Arthritis    "everywhere" - big toes worst   Chronic atrial fibrillation (HCC)    a. on eliquis; b. 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   Seasonal allergies    takes allergy weekly   Status post bilateral breast reduction 09/06/2016   Overview:  08/29/16    Past Surgical History:  Procedure Laterality Date   BREAST REDUCTION SURGERY Bilateral 08/29/2016   Procedure: BILATERAL MAMMARY REDUCTION  (BREAST)WITH LIPOSUCTION;  Surgeon: Peggye Form, DO;  Location: [facility];  Service: Plastics;  Laterality: Bilateral;   CATARACT EXTRACTION W/PHACO Left 05/03/2015   Procedure: CATARACT EXTRACTION PHACO AND INTRAOCULAR LENS PLACEMENT (IOC);  Surgeon: Lockie Mola, MD;  Location: [facility];  Service: Ophthalmology;  Laterality: Left;  CPAP   CATARACT EXTRACTION W/PHACO Right 10/09/2016   Procedure: CATARACT EXTRACTION PHACO AND INTRAOCULAR LENS PLACEMENT (IOC);  Surgeon: Lockie Mola, MD;  Location: [facility];  Service:  Ophthalmology;  Laterality: Right;  sleep apnea   CHONDROPLASTY Right 04/29/2016   Procedure: CHONDROPLASTY;  Surgeon: Donato Heinz, MD;  Location: [facility];  Service: Orthopedics;  Laterality: Right;   COLONOSCOPY WITH PROPOFOL N/A 10/24/2018   Procedure: COLONOSCOPY WITH PROPOFOL;  Surgeon: Pasty Spillers, MD;  Location: [facility];  Service: Endoscopy;  Laterality: N/A;   EYE SURGERY     KNEE ARTHROPLASTY Right  03/25/2018   Procedure: COMPUTER ASSISTED TOTAL KNEE ARTHROPLASTY;  Surgeon: Donato Heinz, MD;  Location: [facility];  Service: Orthopedics;  Laterality: Right;   KNEE ARTHROSCOPY WITH LATERAL MENISECTOMY  04/29/2016   Procedure: KNEE ARTHROSCOPY WITH LATERAL MENISECTOMY;  Surgeon: Donato Heinz, MD;  Location: [facility];  Service: Orthopedics;;   KNEE ARTHROSCOPY WITH MEDIAL MENISECTOMY  04/29/2016   Procedure: KNEE ARTHROSCOPY WITH MEDIAL MENISECTOMY;  Surgeon: Donato Heinz, MD;  Location: [facility];  Service: Orthopedics;;   REDUCTION MAMMAPLASTY Bilateral 09/2016   RETINAL DETACHMENT SURGERY Left May 03, 2015   Dr. Inez Pilgrim, [facility]   TUBAL LIGATION       Current Outpatient Medications:    apixaban (ELIQUIS) 5 MG TABS tablet, Take 1 tablet (5 mg total) by mouth 2 (two) times daily., Disp: 60 tablet, Rfl: 11   atorvastatin (LIPITOR) 80 MG tablet, TAKE 1 TABLET BY MOUTH EVERYDAY AT BEDTIME, Disp: 90 tablet, Rfl: 3   azelastine (OPTIVAR) 0.05 % ophthalmic solution, Place 1 drop into both eyes 2 (two) times daily., Disp: , Rfl:    B Complex-C (B-COMPLEX WITH VITAMIN C) tablet, Take 1 tablet by mouth daily., Disp: , Rfl:    fluticasone (FLONASE) 50 MCG/ACT nasal spray, Place 2 sprays into both nostrils daily., Disp: 16 g, Rfl: 0   GLUCOSAMINE-CHONDROITIN DS PO, Take 3,000 mg by mouth daily., Disp: , Rfl:    HYDROcodone-acetaminophen (NORCO) 5-325 MG tablet, Take 1 tablet by mouth 2 (two) times daily as needed for moderate pain., Disp: 10 tablet, Rfl: 0   losartan (COZAAR) 25 MG tablet, Take 1 tablet (25 mg total) by mouth daily., Disp: 90 tablet, Rfl: 1   Magnesium 250 MG TABS, Take 250 mg by mouth daily., Disp: , Rfl:    montelukast (SINGULAIR) 10 MG tablet, TAKE 1 TABLET BY MOUTH EVERYDAY AT BEDTIME, Disp: 90 tablet, Rfl: 0   Multiple Vitamin (MULTIVITAMIN WITH MINERALS) TABS tablet, Take 1 tablet by mouth daily. One-A-Day Active 65+, Disp: , Rfl:    mupirocin ointment (BACTROBAN) 2 %, Apply  1 application. topically 2 (two) times daily. Prn left arm, Disp: 30 g, Rfl: 0   nystatin cream (MYCOSTATIN), Apply 1 application topically 2 (two) times daily., Disp: 30 g, Rfl: 0   sertraline (ZOLOFT) 50 MG tablet, TAKE 1 TABLET BY MOUTH EVERY DAY, Disp: 90 tablet, Rfl: 1   triamcinolone cream (KENALOG) 0.1 %, Apply 1 application topically 2 (two) times daily., Disp: 30 g, Rfl: 0   Family History  Problem Relation Age of Onset   Alcoholism Other    Heart disease Other    Breast cancer Neg Hx      Social History   Tobacco Use   Smoking status: Former    Packs/day: 0.25    Years: 2.00    Total pack years: 0.50    Types: Cigarettes    Quit date: 10/21/1969    Years since quitting: 52.5   Smokeless tobacco: Never  Vaping Use   Vaping Use: Never used  Substance Use Topics   Alcohol use: Not  Currently    Comment: occasional glass of wine   Drug use: No    Allergies as of 05/02/2022 - Review Complete 05/02/2022  Allergen Reaction Noted   Ivp dye [iodinated contrast media] Shortness Of Breath 04/28/2015   Tape Other (See Comments) 04/28/2015    Review of Systems:    All systems reviewed and negative except where noted in HPI.   Physical Exam:  BP (!) 144/77 (BP Location: Left Arm, Patient Position: Sitting, Cuff Size: Large)   Pulse 89   Temp 98.2 F (36.8 C) (Oral)   Ht 5\' 5"  (1.651 m)   Wt 248 lb 8 oz (112.7 kg)   BMI 41.35 kg/m  No LMP recorded. Patient is postmenopausal.  General:   Alert,  Well-developed, well-nourished, pleasant and cooperative in NAD Head:  Normocephalic and atraumatic. Eyes:  Sclera clear, no icterus.   Conjunctiva pink. Ears:  Normal auditory acuity. Nose:  No deformity, discharge, or lesions. Mouth:  No deformity or lesions,oropharynx pink & moist. Neck:  Supple; no masses or thyromegaly. Lungs:  Respirations even and unlabored.  Clear throughout to auscultation.   No wheezes, crackles, or rhonchi. No acute distress. Heart:  Regular rate  and rhythm; no murmurs, clicks, rubs, or gallops. Abdomen:  Normal bowel sounds. Soft, non-tender and non-distended without masses, hepatosplenomegaly or hernias noted.  No guarding or rebound tenderness.   Rectal: Normal perianal exam, nontender digital rectal exam, good anal sphincter tone Msk:  Symmetrical without gross deformities. Good, equal movement & strength bilaterally. Pulses:  Normal pulses noted. Extremities:  No clubbing or edema.  No cyanosis. Neurologic:  Alert and oriented x3;  grossly normal neurologically. Skin:  Intact without significant lesions or rashes. No jaundice. Psych:  Alert and cooperative. Normal mood and affect.  Imaging Studies: Reviewed  Assessment and Plan:   Mckaela Howley is a 76 y.o. pleasant Caucasian female with obesity, A-fib on Eliquis, hypertension is seen in consultation for loose stools and fecal seepage, incontinence without fecal urgency.  No evidence of anemia, TSH, HbA1c normal,  Patient underwent colonoscopy with random colon biopsies in 2014 which were unremarkable Recommend GI profile PCR, pancreatic fecal elastase levels, celiac disease panel, H. pylori stool antigen If these tests are negative, recommend food allergy profile, alpha gal panel If above work-up is unremarkable, will give empiric trial of rifaximin or Cipro and Flagyl for bacterial overgrowth   Follow up in 3 to 4 months   2015, MD

## 2022-05-02 NOTE — Addendum Note (Signed)
Addended by: Sandy Salaam on: 05/02/2022 02:53 PM   Modules accepted: Orders

## 2022-05-02 NOTE — Telephone Encounter (Signed)
Patient needs a refill on her losartan (COZAAR) 25 MG tablet. Patient states she is out of medication.

## 2022-05-02 NOTE — Telephone Encounter (Signed)
Medication has been refilled.

## 2022-05-05 LAB — CELIAC DISEASE PANEL
Endomysial IgA: NEGATIVE
IgA/Immunoglobulin A, Serum: 466 mg/dL — ABNORMAL HIGH (ref 64–422)
Transglutaminase IgA: <2 U/mL (ref 0–3)

## 2022-05-06 ENCOUNTER — Other Ambulatory Visit: Payer: Self-pay | Admitting: Gastroenterology

## 2022-05-06 DIAGNOSIS — R195 Other fecal abnormalities: Secondary | ICD-10-CM | POA: Diagnosis not present

## 2022-05-06 DIAGNOSIS — F981 Encopresis not due to a substance or known physiological condition: Secondary | ICD-10-CM | POA: Diagnosis not present

## 2022-05-06 DIAGNOSIS — R159 Full incontinence of feces: Secondary | ICD-10-CM | POA: Diagnosis not present

## 2022-05-06 DIAGNOSIS — R69 Illness, unspecified: Secondary | ICD-10-CM | POA: Diagnosis not present

## 2022-05-08 LAB — H. PYLORI ANTIGEN, STOOL: H pylori Ag, Stl: NEGATIVE

## 2022-05-09 LAB — GI PROFILE, STOOL, PCR

## 2022-05-09 LAB — PANCREATIC ELASTASE, FECAL: Pancreatic Elastase, Fecal: 75 ug Elast./g — ABNORMAL LOW (ref >200)

## 2022-05-13 ENCOUNTER — Encounter: Payer: Self-pay | Admitting: Gastroenterology

## 2022-05-13 DIAGNOSIS — R195 Other fecal abnormalities: Secondary | ICD-10-CM

## 2022-05-13 DIAGNOSIS — R159 Full incontinence of feces: Secondary | ICD-10-CM

## 2022-05-16 ENCOUNTER — Other Ambulatory Visit: Payer: Self-pay

## 2022-05-16 ENCOUNTER — Telehealth: Payer: Self-pay

## 2022-05-16 DIAGNOSIS — R195 Other fecal abnormalities: Secondary | ICD-10-CM

## 2022-05-16 MED ORDER — PANCRELIPASE (LIP-PROT-AMYL) 36000-114000 UNITS PO CPEP: ORAL_CAPSULE | ORAL | 3 refills | Status: DC

## 2022-05-16 MED ORDER — ZENPEP 40000-126000 UNITS PO CPEP: 2.0000 | ORAL_CAPSULE | Freq: Two times a day (BID) | ORAL | 11 refills | Status: DC

## 2022-05-16 NOTE — Telephone Encounter (Signed)
-----   Message from Toney Reil, MD sent at 05/13/2022 11:28 AM EDT ----- Could you please call the patient and let her know the results as well as my recommendations?  Thanks  RV ----- Message ----- From: Roena Malady, CMA Sent: 05/10/2022  12:42 PM EDT To: Denman George, CMA

## 2022-05-16 NOTE — Telephone Encounter (Signed)
Called patient and patient will pick up the creon. Went over instructions for that with her. She states that is not whats going on with her. So we order the 2 lab work and the Flexsigmoid procedure for her. She will not be able to have the lab work done because she is out of town till 05/25/2022. Mailed instructions, sent to [employer]

## 2022-05-16 NOTE — Telephone Encounter (Signed)
Pancreatic fecal elastase levels are low which explains loose stools and leakage of the stools.  Recommend strict low-fat, low-carb, high-protein diet.  Recommend to start Creon 36 K or Zenpep 40 K 2 capsules with first bite of each meal and 1 with snack to treat exocrine pancreatic insufficiency   Rohini Vanga  Dr. Allegra Lai, the co-pay amount for patient to pay is $700 a month. Patient is not able to afford. She wants to know if there is anything else that she could take. I could send Zenpep and see if it's less expensive.

## 2022-05-17 MED ORDER — ZENPEP 40000-126000 UNITS PO CPEP
ORAL_CAPSULE | ORAL | 3 refills | Status: DC
Start: 2022-05-17 — End: 2022-06-11

## 2022-05-17 NOTE — Telephone Encounter (Signed)
Sent Zenpep to the pharmacy to see if it is cheaper informed patient by [employer]

## 2022-05-27 DIAGNOSIS — G4733 Obstructive sleep apnea (adult) (pediatric): Secondary | ICD-10-CM | POA: Diagnosis not present

## 2022-05-27 NOTE — Telephone Encounter (Signed)
Patient does not want to cancel the procedure. She will try to fill out patient assistance will come today to fill out

## 2022-05-27 NOTE — Telephone Encounter (Signed)
Patient states she does not qualify for the patient assistance states insurance has tried. We have not filled out anything for her insurance to try though.   Patient canceled the colonoscopy for 05/30/2022.

## 2022-05-29 ENCOUNTER — Encounter: Payer: Self-pay | Admitting: Gastroenterology

## 2022-05-30 ENCOUNTER — Encounter
Admission: RE | Disposition: A | Discharge status: Home / Self Care | Payer: Self-pay | Source: Home / Self Care | Attending: Gastroenterology

## 2022-05-30 ENCOUNTER — Encounter: Payer: Self-pay | Admitting: Gastroenterology

## 2022-05-30 ENCOUNTER — Ambulatory Visit: Payer: Medicare HMO | Admitting: Certified Registered Nurse Anesthetist

## 2022-05-30 ENCOUNTER — Ambulatory Visit
Admission: RE | Admit: 2022-05-30 | Discharge: 2022-05-30 | Disposition: A | Discharge status: Home / Self Care | Payer: Medicare HMO | Attending: Gastroenterology | Admitting: Gastroenterology

## 2022-05-30 DIAGNOSIS — Z87891 Personal history of nicotine dependence: Secondary | ICD-10-CM | POA: Diagnosis not present

## 2022-05-30 DIAGNOSIS — K529 Noninfective gastroenteritis and colitis, unspecified: Secondary | ICD-10-CM | POA: Diagnosis not present

## 2022-05-30 DIAGNOSIS — R195 Other fecal abnormalities: Secondary | ICD-10-CM | POA: Diagnosis not present

## 2022-05-30 DIAGNOSIS — K5730 Diverticulosis of large intestine without perforation or abscess without bleeding: Secondary | ICD-10-CM | POA: Insufficient documentation

## 2022-05-30 HISTORY — PX: FLEXIBLE SIGMOIDOSCOPY: SHX5431

## 2022-05-30 HISTORY — DX: Cerebral infarction, unspecified: I63.9

## 2022-05-30 SURGERY — SIGMOIDOSCOPY, FLEXIBLE
Anesthesia: General

## 2022-05-30 MED ORDER — SODIUM CHLORIDE 0.9 % IV SOLN: INTRAVENOUS | Status: DC

## 2022-05-30 NOTE — Op Note (Signed)
[facility] Patient Name: Crystal Haas Procedure Date: 05/30/2022 11:18 AM MRN: [id number] Account #: [id number] Date of Birth: [birth year] Admit Type: Outpatient Age: 76 Room: [facility] ENDO ROOM 4 Gender: Female Note Status: Finalized Instrument Name: Upper Endoscope [id number] Procedure:             Flexible Sigmoidoscopy Indications:           Clinically significant diarrhea of unexplained origin,                         Chronic diarrhea Providers:             Lin Landsman MD, MD Referring MD:          Deborra Medina, MD (Referring MD) Medicines:             None Complications:         No immediate complications. Estimated blood loss: None. Procedure:             Pre-Anesthesia Assessment:                        - Prior to the procedure, a History and Physical was                         performed, and patient medications and allergies were                         reviewed. The patient is competent. The risks and                         benefits of the procedure and the sedation options and                         risks were discussed with the patient. All questions                         were answered and informed consent was obtained.                         Patient identification and proposed procedure were                         verified by the physician, the nurse and the                         technician in the pre-procedure area in the procedure                         room in the endoscopy suite. Mental Status                         Examination: alert and oriented. Airway Examination:                         normal oropharyngeal airway and neck mobility.                         Respiratory Examination: clear to auscultation. CV  Examination: normal. Prophylactic Antibiotics: The                         patient does not require prophylactic antibiotics.                         Prior Anticoagulants: The patient has  taken Eliquis                         (apixaban), last dose was 1 day prior to procedure.                         ASA Grade Assessment: III - A patient with severe                         systemic disease. After reviewing the risks and                         benefits, the patient was deemed in satisfactory                         condition to undergo the procedure. The anesthesia                         plan was to use no sedation or anesthesia. Immediately                         prior to administration of medications, the patient                         was re-assessed for adequacy to receive sedatives. The                         heart rate, respiratory rate, oxygen saturations,                         blood pressure, adequacy of pulmonary ventilation, and                         response to care were monitored throughout the                         procedure. The physical status of the patient was                         re-assessed after the procedure.                        After obtaining informed consent, the scope was passed                         under direct vision. The Endoscope was introduced                         through the anus and advanced to the the descending                         colon. The flexible sigmoidoscopy was accomplished  without difficulty. The patient tolerated the                         procedure well. The quality of the bowel preparation                         was good. Findings:      The perianal and digital rectal examinations were normal. Pertinent       negatives include normal sphincter tone and no palpable rectal lesions.      Multiple small and large-mouthed diverticula were found in the       recto-sigmoid colon and sigmoid colon. There was no evidence of       diverticular bleeding.      Normal mucosa was found in the left colon. Biopsies for histology were       taken with a cold forceps for evaluation of  microscopic colitis.       Estimated blood loss: none. Impression:            - Severe diverticulosis in the recto-sigmoid colon and                         in the sigmoid colon. There was no evidence of                         diverticular bleeding.                        - Normal mucosa in the left colon. Biopsied. Recommendation:        - Discharge patient to home (with escort).                        - Resume previous diet today.                        - Await pathology results.                        - Return to my office as previously scheduled. Procedure Code(s):     --- Professional ---                        [phone], Sigmoidoscopy, flexible; with biopsy, single or                         multiple Diagnosis Code(s):     --- Professional ---                        R19.7, Diarrhea, unspecified                        K52.9, Noninfective gastroenteritis and colitis,                         unspecified                        K57.30, Diverticulosis of large intestine without                         perforation or abscess without bleeding CPT copyright  2019 American Medical Association. All rights reserved. The codes documented in this report are preliminary and upon coder review may  be revised to meet current compliance requirements. Dr. Libby Maw Toney Reil MD, MD 05/30/2022 12:09:55 PM This report has been signed electronically. Number of Addenda: 0 Note Initiated On: 05/30/2022 11:18 AM Estimated Blood Loss:  Estimated blood loss: none.      [facility]

## 2022-05-30 NOTE — H&P (Signed)
Arlyss Repress, MD [address]  [address]  [place], [place] [address]  Main: [phone]  Fax: [phone] Pager: [phone]  Primary Care Physician:  Sherlene Shams, MD Primary Gastroenterologist:  Dr. Arlyss Repress  Pre-Procedure History & Physical: HPI:  Crystal Haas is a 76 y.o. female is here for an flexible sigmoidoscopy.   Past Medical History:  Diagnosis Date   Anemia    distant past   Anxiety    Arthritis    "everywhere" - big toes worst   Chronic atrial fibrillation (HCC)    a. on eliquis; b. 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   Seasonal allergies    takes allergy weekly   Status post bilateral breast reduction 09/06/2016   Overview:  08/29/16   Stroke [facility])     Past Surgical History:  Procedure Laterality Date   BREAST REDUCTION SURGERY Bilateral 08/29/2016   Procedure: BILATERAL MAMMARY REDUCTION  (BREAST)WITH LIPOSUCTION;  Surgeon: Peggye Form, DO;  Location: [facility];  Service: Plastics;  Laterality: Bilateral;   CATARACT EXTRACTION W/PHACO Left 05/03/2015   Procedure: CATARACT EXTRACTION PHACO AND INTRAOCULAR LENS PLACEMENT (IOC);  Surgeon: Lockie Mola, MD;  Location: [facility];  Service: Ophthalmology;  Laterality: Left;  CPAP   CATARACT EXTRACTION W/PHACO Right 10/09/2016   Procedure: CATARACT EXTRACTION PHACO AND INTRAOCULAR LENS PLACEMENT (IOC);  Surgeon: Lockie Mola, MD;  Location: [facility];  Service: Ophthalmology;  Laterality: Right;  sleep apnea   CHONDROPLASTY Right 04/29/2016   Procedure: CHONDROPLASTY;  Surgeon: Donato Heinz, MD;  Location: [facility];  Service: Orthopedics;  Laterality: Right;   COLONOSCOPY WITH PROPOFOL N/A 10/24/2018    Procedure: COLONOSCOPY WITH PROPOFOL;  Surgeon: Pasty Spillers, MD;  Location: [facility];  Service: Endoscopy;  Laterality: N/A;   EYE SURGERY     KNEE ARTHROPLASTY Right 03/25/2018   Procedure: COMPUTER ASSISTED TOTAL KNEE ARTHROPLASTY;  Surgeon: Donato Heinz, MD;  Location: [facility];  Service: Orthopedics;  Laterality: Right;   KNEE ARTHROSCOPY WITH LATERAL MENISECTOMY  04/29/2016   Procedure: KNEE ARTHROSCOPY WITH LATERAL MENISECTOMY;  Surgeon: Donato Heinz, MD;  Location: [facility];  Service: Orthopedics;;   KNEE ARTHROSCOPY WITH MEDIAL MENISECTOMY  04/29/2016   Procedure: KNEE ARTHROSCOPY WITH MEDIAL MENISECTOMY;  Surgeon: Donato Heinz, MD;  Location: [facility];  Service: Orthopedics;;   REDUCTION MAMMAPLASTY Bilateral 09/2016   RETINAL DETACHMENT SURGERY Left May 03, 2015   Dr. Inez Pilgrim, [facility]   TUBAL LIGATION      Prior to Admission medications   Medication Sig Start Date End Date Taking? Authorizing Provider  apixaban (ELIQUIS) 5 MG TABS tablet Take 1 tablet (5 mg total) by mouth 2 (two) times daily. 01/17/22  Yes Iran Ouch, MD  atorvastatin (LIPITOR) 80 MG tablet TAKE 1 TABLET BY MOUTH EVERYDAY AT BEDTIME 04/19/22  Yes Sherlene Shams, MD  losartan (COZAAR) 25 MG tablet Take 1 tablet (25 mg total) by mouth daily. 05/02/22  Yes Sherlene Shams, MD  Magnesium 250 MG TABS Take 250 mg by mouth daily.   Yes [provider]  Multiple Vitamin (MULTIVITAMIN  WITH MINERALS) TABS tablet Take 1 tablet by mouth daily. One-A-Day Active 65+   Yes [provider]  sertraline (ZOLOFT) 50 MG tablet TAKE 1 TABLET BY MOUTH EVERY DAY 04/19/22  Yes Crecencio Mc, MD  azelastine (OPTIVAR) 0.05 % ophthalmic solution Place 1 drop into both eyes 2 (two) times daily.    [provider]  B Complex-C (B-COMPLEX WITH VITAMIN C) tablet Take 1 tablet by mouth daily.    [provider]  fluticasone (FLONASE) 50 MCG/ACT nasal spray Place 2 sprays into both  nostrils daily. 01/22/19   Leone Haven, MD  GLUCOSAMINE-CHONDROITIN DS PO Take 3,000 mg by mouth daily.    [provider]  HYDROcodone-acetaminophen (NORCO) 5-325 MG tablet Take 1 tablet by mouth 2 (two) times daily as needed for moderate pain. 03/28/22   McLean-Scocuzza, Nino Glow, MD  montelukast (SINGULAIR) 10 MG tablet TAKE 1 TABLET BY MOUTH EVERYDAY AT BEDTIME 01/08/22   Dutch Quint B, FNP  mupirocin ointment (BACTROBAN) 2 % Apply 1 application. topically 2 (two) times daily. Prn left arm 03/28/22   McLean-Scocuzza, Nino Glow, MD  nystatin cream (MYCOSTATIN) Apply 1 application topically 2 (two) times daily. 07/17/21   Leone Haven, MD  Pancrelipase, Lip-Prot-Amyl, (ZENPEP) 40000-126000 units CPEP Take 2 capsules by mouth 2 (two) times daily with a meal. And 1 capsule with snack. 05/16/22   Lin Landsman, MD  Pancrelipase, Lip-Prot-Amyl, (ZENPEP) [phone] units CPEP Take 2 capsules with the first bite of each meal and 1 capsule with the first bite of each snack 05/17/22   Lin Landsman, MD  triamcinolone cream (KENALOG) 0.1 % Apply 1 application topically 2 (two) times daily. 12/07/21   Crecencio Mc, MD    Allergies as of 05/16/2022 - Review Complete 05/02/2022  Allergen Reaction Noted   Ivp dye [iodinated contrast media] Shortness Of Breath 04/28/2015   Tape Other (See Comments) 04/28/2015    Family History  Problem Relation Age of Onset   Alcoholism Other    Heart disease Other    Breast cancer Neg Hx     Social History   Socioeconomic History   Marital status: Widowed    Spouse name: Not on file   Number of children: Not on file   Years of education: Not on file   Highest education level: Not on file  Occupational History   Not on file  Tobacco Use   Smoking status: Former    Packs/day: 0.25    Years: 2.00    Total pack years: 0.50    Types: Cigarettes    Quit date: 10/21/1969    Years since quitting: 52.6   Smokeless tobacco: Never   Vaping Use   Vaping Use: Never used  Substance and Sexual Activity   Alcohol use: Not Currently    Comment: occasional glass of wine   Drug use: No   Sexual activity: Not on file  Other Topics Concern   Not on file  Social History Narrative   Not on file   Social Determinants of Health   Financial Resource Strain: Low Risk  (12/11/2020)   Overall Financial Resource Strain (CARDIA)    Difficulty of Paying Living Expenses: Not hard at all  Food Insecurity: No Food Insecurity (12/11/2020)   Hunger Vital Sign    Worried About Running Out of Food in the Last Year: Never true    Ran Out of Food in the Last Year: Never true  Transportation Needs: No Transportation Needs (  12/11/2020)   PRAPARE - Administrator, Civil Service (Medical): No    Lack of Transportation (Non-Medical): No  Physical Activity: Not on file  Stress: No Stress Concern Present (12/11/2020)   [employer] of Occupational Health - Occupational Stress Questionnaire    Feeling of Stress : Not at all  Social Connections: Unknown (12/11/2020)   Social Connection and Isolation Panel [NHANES]    Frequency of Communication with Friends and Family: More than three times a week    Frequency of Social Gatherings with Friends and Family: Not on file    Attends Religious Services: Not on file    Active Member of Clubs or Organizations: Not on file    Attends Banker Meetings: Not on file    Marital Status: Not on file  Intimate Partner Violence: Not At Risk (12/11/2020)   Humiliation, Afraid, Rape, and Kick questionnaire    Fear of Current or Ex-Partner: No    Emotionally Abused: No    Physically Abused: No    Sexually Abused: No    Review of Systems: See HPI, otherwise negative ROS  Physical Exam: BP (!) 142/104   Pulse 88   Temp (!) 97.1 F (36.2 C) (Temporal)   Resp 17   Ht 5\' 5"  (1.651 m)   Wt 113.4 kg   SpO2 100%   BMI 41.60 kg/m  General:   Alert,  pleasant and cooperative in  NAD Head:  Normocephalic and atraumatic. Neck:  Supple; no masses or thyromegaly. Lungs:  Clear throughout to auscultation.    Heart:  Regular rate and rhythm. Abdomen:  Soft, nontender and nondistended. Normal bowel sounds, without guarding, and without rebound.   Neurologic:  Alert and  oriented x4;  grossly normal neurologically.  Impression/Plan: Crystal Haas is here for an flexible sigmoidoscopy to be performed for chronic diarrhea  Risks, benefits, limitations, and alternatives regarding  flexible sigmoidoscopy have been reviewed with the patient.  Questions have been answered.  All parties agreeable.   [name], MD  05/30/2022, 11:22 AM

## 2022-05-31 ENCOUNTER — Encounter: Payer: Self-pay | Admitting: Gastroenterology

## 2022-05-31 LAB — SURGICAL PATHOLOGY

## 2022-06-03 ENCOUNTER — Telehealth: Payer: Self-pay | Admitting: Gastroenterology

## 2022-06-03 ENCOUNTER — Telehealth: Payer: Self-pay

## 2022-06-03 NOTE — Telephone Encounter (Signed)
Pt called left message still having problems with loose bowels she would like some advice her appointment is not until November

## 2022-06-03 NOTE — Telephone Encounter (Signed)
Patient verbalized understanding of results. Patient states when she got home from the procedure she had the same issues happening. She states that Dr. Allegra Lai told her that her colon was clear. Informed patient the Flexsigmoid does not go in the upper part of colon so it could of been up there. Patient wanted Dr. Allegra Lai to call her today. I told her Dr. Allegra Lai was on vacation till 06/17/2022 and she said okay she will bring the patient assistance forms to [place]

## 2022-06-03 NOTE — Telephone Encounter (Signed)
Talk to her in a different message

## 2022-06-03 NOTE — Telephone Encounter (Signed)
-----   Message from Toney Reil, MD sent at 05/31/2022 12:28 PM EDT ----- Please inform patient that the pathology results from recent sigmoidoscopy does not reveal any colitis.  Her symptoms of loose stools, leakage or due to pancreatic insufficiency.  She says she could not afford the pancreatic enzymes.  Please inform her about the patient assistance program  Rohini Vanga

## 2022-06-11 ENCOUNTER — Ambulatory Visit (INDEPENDENT_AMBULATORY_CARE_PROVIDER_SITE_OTHER): Payer: Medicare HMO | Admitting: Cardiovascular Disease

## 2022-06-11 ENCOUNTER — Encounter: Payer: Self-pay | Admitting: Cardiovascular Disease

## 2022-06-11 VITALS — BP 118/62 | HR 90 | Ht 65.0 in | Wt 251.5 lb

## 2022-06-11 DIAGNOSIS — E785 Hyperlipidemia, unspecified: Secondary | ICD-10-CM

## 2022-06-11 DIAGNOSIS — Z8673 Personal history of transient ischemic attack (TIA), and cerebral infarction without residual deficits: Secondary | ICD-10-CM

## 2022-06-11 DIAGNOSIS — I482 Chronic atrial fibrillation, unspecified: Secondary | ICD-10-CM

## 2022-06-11 DIAGNOSIS — I10 Essential (primary) hypertension: Secondary | ICD-10-CM

## 2022-06-11 NOTE — Patient Instructions (Signed)

## 2022-06-11 NOTE — Progress Notes (Signed)
Cardiology Office Note   Date:  06/11/2022   ID:  Crystal Haas, DOB December 09, 1945, MRN [id number]  PCP:  Sherlene Shams, MD  Cardiologist:   Lorine Bears, MD   Chief Complaint  Patient presents with   Other    6 Month f/u no complaints today. Pt would like to have PA formed fill out. Meds reviewed verbally with pt.      History of Present Illness: Crystal Haas is a 76 y.o. female who presents for a follow-up visit regarding chronic atrial fibrillation and hypertension.  Echocardiogram in July, 2016 showed normal LV systolic function with mildly dilated right and left atrium.  She is tolerating anticoagulation with Eliquis.   She has known history of sleep apnea on CPAP. She had previous knee surgery.  She was hospitalized in November 2021 with numbness, slurred speech and weakness of the right arm and leg.  Symptoms lasted for about 20 minutes. MRI showed small infarcts in the left insula and overlying frontal operculum with possible infarct in the left corona radiate.    Echocardiogram showed normal LV systolic function with no obvious source of cardiac embolism.  Carotid Doppler showed no significant disease.  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  She has been doing reasonably well with no recent chest pain, shortness of breath or palpitations.  No side effects with anticoagulation but does complain of increased cost when she is in the donut hole.     Past Medical History:  Diagnosis Date   Anemia    distant past   Anxiety    Arthritis    "everywhere" - big toes worst   Chronic atrial fibrillation (HCC)    a. on eliquis; b. 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   Seasonal allergies    takes allergy weekly   Status post bilateral breast reduction 09/06/2016   Overview:  08/29/16   Stroke [facility])     Past Surgical History:  Procedure Laterality Date   BREAST REDUCTION SURGERY Bilateral 08/29/2016   Procedure: BILATERAL MAMMARY REDUCTION  (BREAST)WITH LIPOSUCTION;  Surgeon: Peggye Form, DO;  Location: [facility];  Service: Plastics;  Laterality: Bilateral;   CATARACT EXTRACTION W/PHACO Left 05/03/2015   Procedure: CATARACT EXTRACTION PHACO AND INTRAOCULAR LENS PLACEMENT (IOC);  Surgeon: Lockie Mola, MD;  Location: [facility];  Service: Ophthalmology;  Laterality: Left;  CPAP   CATARACT EXTRACTION W/PHACO Right 10/09/2016   Procedure: CATARACT EXTRACTION PHACO AND INTRAOCULAR LENS PLACEMENT (IOC);  Surgeon: Lockie Mola, MD;  Location: [facility];  Service: Ophthalmology;  Laterality: Right;  sleep apnea   CHONDROPLASTY Right 04/29/2016   Procedure: CHONDROPLASTY;  Surgeon: Donato Heinz, MD;  Location: [facility];  Service: Orthopedics;  Laterality: Right;   COLONOSCOPY WITH PROPOFOL N/A 10/24/2018   Procedure: COLONOSCOPY WITH PROPOFOL;  Surgeon: Pasty Spillers, MD;  Location: [facility];  Service: Endoscopy;  Laterality: N/A;   EYE SURGERY     FLEXIBLE SIGMOIDOSCOPY N/A 05/30/2022  Procedure: FLEXIBLE SIGMOIDOSCOPY;  Surgeon: Toney Reil, MD;  Location: [facility];  Service: Gastroenterology;  Laterality: N/A;   KNEE ARTHROPLASTY Right 03/25/2018   Procedure: COMPUTER ASSISTED TOTAL KNEE ARTHROPLASTY;  Surgeon: Donato Heinz, MD;  Location: [facility];  Service: Orthopedics;  Laterality: Right;   KNEE ARTHROSCOPY WITH LATERAL MENISECTOMY  04/29/2016   Procedure: KNEE ARTHROSCOPY WITH LATERAL MENISECTOMY;  Surgeon: Donato Heinz, MD;  Location: [facility];  Service: Orthopedics;;   KNEE ARTHROSCOPY WITH MEDIAL MENISECTOMY  04/29/2016    Procedure: KNEE ARTHROSCOPY WITH MEDIAL MENISECTOMY;  Surgeon: Donato Heinz, MD;  Location: [facility];  Service: Orthopedics;;   REDUCTION MAMMAPLASTY Bilateral 09/2016   RETINAL DETACHMENT SURGERY Left May 03, 2015   Dr. Inez Pilgrim, [facility]   TUBAL LIGATION       Current Outpatient Medications  Medication Sig Dispense Refill   apixaban (ELIQUIS) 5 MG TABS tablet Take 1 tablet (5 mg total) by mouth 2 (two) times daily. 60 tablet 11   atorvastatin (LIPITOR) 80 MG tablet TAKE 1 TABLET BY MOUTH EVERYDAY AT BEDTIME 90 tablet 3   azelastine (OPTIVAR) 0.05 % ophthalmic solution Place 1 drop into both eyes 2 (two) times daily.     B Complex-C (B-COMPLEX WITH VITAMIN C) tablet Take 1 tablet by mouth daily.     fluticasone (FLONASE) 50 MCG/ACT nasal spray Place 2 sprays into both nostrils daily. 16 g 0   GLUCOSAMINE-CHONDROITIN DS PO Take 3,000 mg by mouth daily.     losartan (COZAAR) 25 MG tablet Take 1 tablet (25 mg total) by mouth daily. 90 tablet 1   Magnesium 250 MG TABS Take 250 mg by mouth daily.     Multiple Vitamin (MULTIVITAMIN WITH MINERALS) TABS tablet Take 1 tablet by mouth daily. One-A-Day Active 65+     nystatin cream (MYCOSTATIN) Apply 1 application topically 2 (two) times daily. 30 g 0   sertraline (ZOLOFT) 50 MG tablet TAKE 1 TABLET BY MOUTH EVERY DAY 90 tablet 1   triamcinolone cream (KENALOG) 0.1 % Apply 1 application topically 2 (two) times daily. 30 g 0   CREON 36000-114000 units CPEP capsule Take by mouth as directed. (Patient not taking: Reported on 06/11/2022)     No current facility-administered medications for this visit.    Allergies:   Ivp dye [iodinated contrast media] and Tape    Social History:  The patient  reports that she quit smoking about 52 years ago. Her smoking use included cigarettes. She has a 0.50 pack-year smoking history. She has never used smokeless tobacco. She reports that she does not currently use alcohol. She reports that she does not use drugs.       ROS:  Please see the history of present illness.   Otherwise, review of systems are positive for none.   All other systems are reviewed and negative.    PHYSICAL EXAM: VS:  BP 118/62 (BP Location: Left Arm, Patient Position: Sitting, Cuff Size: Large)   Pulse 90   Ht 5\' 5"  (1.651 m)   Wt 251 lb 8 oz (114.1 kg)   SpO2 96%   BMI 41.85 kg/m  , BMI Body mass index is 41.85 kg/m. GEN: Well nourished, well developed, in no acute distress  HEENT: normal  Neck: no JVD, carotid bruits, or masses Cardiac: Irregularly irregular; no rubs, or gallops,no edema .  1/6 systolic murmur in the aortic area Respiratory:  clear to auscultation bilaterally, normal work of breathing GI: soft, nontender, nondistended, + BS  MS: no deformity or atrophy  Skin: warm and dry, no rash Neuro:  Strength and sensation are intact Psych: euthymic mood, full affect Radial pulses normal bilaterally   EKG:  EKG  ordered today. EKG showed atrial fibrillation with PVCs.  Ventricular rate is 90 bpm.  Low voltage.  Recent Labs: 11/16/2021: TSH 4.20 03/28/2022: ALT 15; BUN 13; Creatinine, Ser 0.82; Hemoglobin 13.1; Platelets 151.0; Potassium 4.6; Sodium 141    Lipid Panel    Component Value Date/Time   CHOL 94 09/20/2020 0632   CHOL 186 09/04/2015 1051   TRIG 90 09/20/2020 0632   HDL 43 09/20/2020 0632   HDL 66 09/04/2015 1051   CHOLHDL 2.2 09/20/2020 0632   VLDL 18 09/20/2020 0632   LDLCALC 33 09/20/2020 0632   LDLCALC 99 09/04/2015 1051   LDLDIRECT 57.0 10/31/2020 1054      Wt Readings from Last 3 Encounters:  06/11/22 251 lb 8 oz (114.1 kg)  05/30/22 250 lb (113.4 kg)  05/02/22 248 lb 8 oz (112.7 kg)        ASSESSMENT AND PLAN:  1.  Chronic atrial fibrillation: Ventricular rate is controlled without any medication.   She is tolerating anticoagulation with Eliquis.  I reviewed her recent labs which showed normal CBC and renal function.  We will have the patient apply for assistance for  Eliquis.  2.  Essential hypertension: Blood sugars well controlled on current medications.  3.   left MCA stroke: No recurrent symptoms.  Continue medical therapy.  4.  Hyperlipidemia: Continue high-dose atorvastatin.  Most recent lipid profile in January showed an LDL of 57.    5.  Concerns about subclavian stenosis: The patient was noted to have different blood pressure in both arms but it was still less than 10 mmHg.  In addition, her radial pulses are equal bilaterally and she has no convincing symptoms of subclavian steal syndrome.  Suspect that she had vertigo recently that has resolved since then.  No further work-up of this is needed at the present time.   Disposition:   FU with me in 6 months.  Signed,  Lorine Bears, MD  06/11/2022 2:19 PM    [facility]

## 2022-06-12 ENCOUNTER — Telehealth: Payer: Self-pay

## 2022-06-12 NOTE — Telephone Encounter (Signed)
Patient assistance app for Elquis faxed to [employer]. Fax confirmation received. Application placed in the med room file cabinet.

## 2022-06-18 NOTE — Telephone Encounter (Signed)
Patient dropped off copies requested, gave to [facility].

## 2022-06-18 NOTE — Telephone Encounter (Signed)
Fax received from [facility] stating they received the patient's application for assistance, but in order to complete the application, they advised:  The application must be signed and dated by the patient and physician Proof of income for the patient's entire household (example: the most recent federal tax return, W@, 1099, pension statement, SS statement, or at least 2 consecutive pay stubs)  I pulled the fax that [facility] sent on 06/12/22. It appears the application was signed by the patient and Dr. Fletcher Anon, but I do not see a proof of income.  I called [employer] to verify they received all 5 pages of the original fax. Per Solmon Ice at [facility], they did receive all 5 pages, but he application that was filled out was an older version and did not contain a space to initial for a consumer report check. They are still able to use what was sent, but will need to obtain: 1) a proof of income 2) an itemized list of out of pocket expenses from the pharmacy for 2023 to insure her 3% out of pocket was met   I have called the patient. She advised she does not file taxes, but did submit a SS statement to the office. I advised her I do not see this in her original paperwork. I have also advised we need an itemized list of out of pocket expenses from her pharmacy as well for 2023.  Per the patient, she will bring a copy of both of the requested items to our office so we can submit these for her.  She was very appreciative of the call.

## 2022-06-19 NOTE — Telephone Encounter (Signed)
Info requested by [employer] faxed -proof of income -itemized pharmacy expenses  Fax confirmation received. Application case ID referenced # [id number]

## 2022-06-20 NOTE — Addendum Note (Signed)
Addended by: Kendrick Fries on: 06/20/2022 12:48 PM   Modules accepted: Orders

## 2022-06-25 NOTE — Telephone Encounter (Signed)
Fax received from [employer] requesting additional info  -2022 tax return or a statement saying that she no longer file taxes -Patients signature incomplete.   Pt sts that is her signature and the way she signs. Pt will send the requested statement through mychart. Adv the pt that once received I will fax the info to [employer].

## 2022-06-27 DIAGNOSIS — G4733 Obstructive sleep apnea (adult) (pediatric): Secondary | ICD-10-CM | POA: Diagnosis not present

## 2022-07-01 ENCOUNTER — Encounter: Payer: Self-pay | Admitting: Gastroenterology

## 2022-07-09 NOTE — Telephone Encounter (Signed)
Letter received from [employer].  Patient assistance application for Eliquis has been approved through 10/20/22.

## 2022-08-05 ENCOUNTER — Telehealth: Payer: Self-pay

## 2022-08-05 NOTE — Telephone Encounter (Signed)
Filled out [employer] provider portion of patient assistance form for [employer]. Faxed form to [phone] 

## 2022-08-28 ENCOUNTER — Telehealth: Payer: Self-pay

## 2022-08-28 NOTE — Telephone Encounter (Signed)
Called [employer] patient assistance to check on the enrollement form for 2024 and they state the provider portion was dated 2022 and not 2023. Fixed the date and faxed it back to them. Faxed it to the [phone] and [phone]

## 2022-09-02 NOTE — Telephone Encounter (Signed)
Patient has been approved for patient assistance through 10/21/2023

## 2022-09-04 ENCOUNTER — Other Ambulatory Visit
Admission: RE | Admit: 2022-09-04 | Discharge: 2022-09-04 | Disposition: A | Discharge status: Home / Self Care | Payer: Medicare HMO | Source: Ambulatory Visit | Attending: Gastroenterology | Admitting: Gastroenterology

## 2022-09-04 ENCOUNTER — Encounter: Payer: Self-pay | Admitting: Gastroenterology

## 2022-09-04 ENCOUNTER — Ambulatory Visit: Payer: Medicare HMO | Admitting: Gastroenterology

## 2022-09-04 VITALS — BP 153/70 | HR 78 | Temp 98.1°F | Ht 65.0 in | Wt 252.4 lb

## 2022-09-04 DIAGNOSIS — R195 Other fecal abnormalities: Secondary | ICD-10-CM | POA: Diagnosis present

## 2022-09-04 DIAGNOSIS — K8681 Exocrine pancreatic insufficiency: Secondary | ICD-10-CM

## 2022-09-04 DIAGNOSIS — Z0189 Encounter for other specified special examinations: Secondary | ICD-10-CM | POA: Insufficient documentation

## 2022-09-04 NOTE — Progress Notes (Signed)
Arlyss Repress, MD [address]  [address]  [place], [place] [address]  Main: [phone]  Fax: [phone]    Gastroenterology Consultation  Referring Provider:     Sherlene Shams, MD Primary Care Physician:  Sherlene Shams, MD Primary Gastroenterologist:  Dr. Arlyss Repress Reason for Consultation: Exocrine pancreatic insufficiency        HPI:   Crystal Haas is a 76 y.o. female referred by Dr. Sherlene Shams, MD  for consultation & management of leakage of stool.  Patient reports that for almost 15 years, she has been experiencing loose nonbloody stools and she thought she had a nervous colon.  She underwent colonoscopy by Dr. Servando Snare in 2014 for colon cancer screening and chronic diarrhea, she did have sigmoid diverticulosis and random colon biopsies were negative for microscopic colitis.  Over the last 7 years, patient reports that she has been noticing that her undergarments are soiled with stool without her knowledge, she has stool leakage almost on a daily basis.  She cannot exercise any longer, cannot dance or go out.  Her social life has been significantly compromised because of stool leakage.  She describes her stools as soft and clumpy, Bristol stool scale 6.  She denies any rectal bleeding or rectal pain or discomfort.  She denies any abdominal cramps, bloating, postprandial urgency.  Patient denies any weight loss, rather gained weight since she stopped being physically active because of stool leakage.  She denies any nocturnal symptoms other than occasional stool leakage.  She denies any particular relation to food.  She denies consumption of carbonated beverages, artificial sweeteners, red meat.  She tries to eat healthy.  Patient takes magnesium tablets daily, started 2 months ago.  Symptoms have remained unchanged.  She also tried Imodium, Pepto-Bismol which did not help much.  Patient reports that the consistency of the stool has become more looser within last few  years.  Follow-up visit 09/04/2022 Patient is diagnosed with severe EPI, started on Creon.  She is taking 50 K with each meal and 36 K with snack.  Her diarrhea has significantly improved, currently having 1-2 semiformed bowel movements daily.  She was doing well until this past weekend when she was at a grocery store, she had an accident when she was trying to load her groceries into her car.  Since then, she has been anxious to go out.  She also states that her weakness is chips and ice cream, cannot stop eating these.  NSAIDs: None  Antiplts/Anticoagulants/Anti thrombotics: Eliquis for history of A-fib  GI Procedures: Colonoscopy 10/2018, found to have sigmoid diverticulosis, internal hemorrhoids  Colonoscopy 08/2013 Diagnosis:  COLON RANDOM COLD BIOPSY:  - NEGATIVE FOR COLITIS.   Flexible sigmoidoscopy 05/30/2022 - Severe diverticulosis in the recto-sigmoid colon and in the sigmoid colon. There was no evidence of diverticular bleeding. - Normal mucosa in the left colon. Biopsied. DIAGNOSIS:  A. COLON, RANDOM LEFT; COLD BIOPSY:  - BENIGN COLONIC MUCOSA WITH SUPERFICIAL REACTIVE CHANGES AND RARE CRYPT  BRANCHING.  - NEGATIVE FOR ACTIVE MUCOSAL COLITIS AND FEATURES OF MICROSCOPIC  COLITIS.  - NEGATIVE FOR DYSPLASIA AND MALIGNANCY.    Past Medical History:  Diagnosis Date   Anemia    distant past   Anxiety    Arthritis    "everywhere" - big toes worst   Chronic atrial fibrillation (HCC)    a. on eliquis; b. 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   Seasonal allergies    takes allergy weekly   Status post bilateral breast reduction 09/06/2016   Overview:  08/29/16   Stroke [facility](HCC)     Past Surgical History:  Procedure Laterality Date   BREAST  REDUCTION SURGERY Bilateral 08/29/2016   Procedure: BILATERAL MAMMARY REDUCTION  (BREAST)WITH LIPOSUCTION;  Surgeon: Peggye Formlaire S Dillingham, DO;  Location: [facility];  Service: Plastics;  Laterality: Bilateral;   CATARACT EXTRACTION W/PHACO Left 05/03/2015   Procedure: CATARACT EXTRACTION PHACO AND INTRAOCULAR LENS PLACEMENT (IOC);  Surgeon: Lockie Molahadwick Brasington, MD;  [facility];  Service: Ophthalmology;  Laterality: Left;  CPAP   CATARACT EXTRACTION W/PHACO Right 10/09/2016   Procedure: CATARACT EXTRACTION PHACO AND INTRAOCULAR LENS PLACEMENT (I[facility]);  Surgeon: Lockie Molahadwick Brasington, MD;  [facility];  Service: Ophthalmology;  Laterality: Right;  sleep apnea   CHONDROPLASTY Right 04/29/2016   Procedure: CHONDROPLASTY;  Surgeon: Donato HeinzJames P Hooten, MD;  Location: [facility];  Service: Orthopedics;  Laterality: Right;   COLONOSCOPY WITH PROPOFOL N/A 10/24/2018   Procedure: COLONOSCOPY WITH PROPOFOL;  Surgeon: Pasty Spillersahiliani, Varnita B, MD;  Location: [facility];  Service: Endoscopy;  Laterality: N/A;   EYE SURGERY     FLEXIBLE SIGMOIDOSCOPY N/A 05/30/2022   Procedure: FLEXIBLE SIGMOIDOSCOPY;  Surgeon: Toney ReilVanga, Jenne Sellinger Reddy, MD;  Location: [facility];  Service: Gastroenterology;  Laterality: N/A;   KNEE ARTHROPLASTY Right 03/25/2018   Procedure: COMPUTER ASSISTED TOTAL KNEE ARTHROPLASTY;  Surgeon: Donato HeinzHooten, James P, MD;  Location: [facility];  Service: Orthopedics;  Laterality: Right;   KNEE ARTHROSCOPY WITH LATERAL MENISECTOMY  04/29/2016   Procedure: KNEE ARTHROSCOPY WITH LATERAL MENISECTOMY;  Surgeon: Donato HeinzJames P Hooten, MD;  Location: [facility];  Service: Orthopedics;;   KNEE ARTHROSCOPY WITH MEDIAL MENISECTOMY  04/29/2016   Procedure: KNEE ARTHROSCOPY WITH MEDIAL MENISECTOMY;  Surgeon: Donato HeinzJames P Hooten, MD;  Location: [facility];  Service: Orthopedics;;   REDUCTION MAMMAPLASTY Bilateral 09/2016   RETINAL DETACHMENT SURGERY Left May 03, 2015   [facility] Inez [facility]Brasington[facility]   TUBAL  LIGATION       Current Outpatient Medications:    apixaban (ELIQUIS) 5 MG TABS tablet, Take 1 tablet (5 mg total) by mouth 2 (two) times daily., Disp: 60 tablet, Rfl: 11   atorvastatin (LIPITOR) 80 MG tablet, TAKE 1 TABLET BY MOUTH EVERYDAY AT BEDTIME, Disp: 90 tablet, Rfl: 3   azelastine (OPTIVAR) 0.05 % ophthalmic solution, Place 1 drop into both eyes 2 (two) times daily., Disp: , Rfl:    B Complex-C (B-COMPLEX WITH VITAMIN C) tablet, Take 1 tablet by mouth daily., Disp: , Rfl:    CREON 36000-114000 units CPEP capsule, Take by mouth as directed., Disp: , Rfl:    fluticasone (FLONASE) 50 MCG/ACT nasal spray, Place 2 sprays into both nostrils daily., Disp: 16 g, Rfl: 0   GLUCOSAMINE-CHONDROITIN DS PO, Take 3,000 mg by mouth daily., Disp: , Rfl:    losartan (COZAAR) 25 MG tablet, Take 1 tablet (25 mg total) by mouth daily., Disp: 90 tablet, Rfl: 1   Magnesium 250 MG TABS, Take 250 mg by mouth daily., Disp: , Rfl:    Multiple Vitamin (MULTIVITAMIN WITH MINERALS) TABS tablet, Take 1 tablet by mouth daily.  One-A-Day Active 65+, Disp: , Rfl:    nystatin cream (MYCOSTATIN), Apply 1 application topically 2 (two) times daily., Disp: 30 g, Rfl: 0   sertraline (ZOLOFT) 50 MG tablet, TAKE 1 TABLET BY MOUTH EVERY DAY, Disp: 90 tablet, Rfl: 1   triamcinolone cream (KENALOG) 0.1 %, Apply 1 application topically 2 (two) times daily., Disp: 30 g, Rfl: 0   Family History  Problem Relation Age of Onset   Alcoholism Other    Heart disease Other    Breast cancer Neg Hx      Social History   Tobacco Use   Smoking status: Former    Packs/day: 0.25    Years: 2.00    Total pack years: 0.50    Types: Cigarettes    Quit date: 10/21/1969    Years since quitting: 52.9   Smokeless tobacco: Never  Vaping Use   Vaping Use: Never used  Substance Use Topics   Alcohol use: Not Currently    Comment: occasional glass of wine   Drug use: No    Allergies as of 09/04/2022 - Review Complete 09/04/2022  Allergen  Reaction Noted   Ivp dye [iodinated contrast media] Shortness Of Breath 04/28/2015   Tape Other (See Comments) 04/28/2015    Review of Systems:    All systems reviewed and negative except where noted in HPI.   Physical Exam:  BP (!) 153/70 (BP Location: Left Arm, Patient Position: Sitting, Cuff Size: Normal)   Pulse 78   Temp 98.1 F (36.7 C) (Oral)   Ht 5\' 5"  (1.651 m)   Wt 252 lb 6 oz (114.5 kg)   BMI 42.00 kg/m  No LMP recorded. Patient is postmenopausal.  General:   Alert,  Well-developed, well-nourished, pleasant and cooperative in NAD Head:  Normocephalic and atraumatic. Eyes:  Sclera clear, no icterus.   Conjunctiva pink. Ears:  Normal auditory acuity. Nose:  No deformity, discharge, or lesions. Mouth:  No deformity or lesions,oropharynx pink & moist. Neck:  Supple; no masses or thyromegaly. Lungs:  Respirations even and unlabored.  Clear throughout to auscultation.   No wheezes, crackles, or rhonchi. No acute distress. Heart:  Regular rate and rhythm; no murmurs, clicks, rubs, or gallops. Abdomen:  Normal bowel sounds. Soft, non-tender and non-distended without masses, hepatosplenomegaly or hernias noted.  No guarding or rebound tenderness.   Rectal: Normal perianal exam, nontender digital rectal exam, good anal sphincter tone Msk:  Symmetrical without gross deformities. Good, equal movement & strength bilaterally. Pulses:  Normal pulses noted. Extremities:  No clubbing or edema.  No cyanosis. Neurologic:  Alert and oriented x3;  grossly normal neurologically. Skin:  Intact without significant lesions or rashes. No jaundice. Psych:  Alert and cooperative. Normal mood and affect.  Imaging Studies: Reviewed  Assessment and Plan:   Crystal Haas is a 76 y.o. pleasant Caucasian female with obesity, A-fib on Eliquis, hypertension is seen in consultation for loose stools and fecal seepage, incontinence without fecal urgency.  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 Flexible sigmoidoscopy with left colon biopsies were unremarkable GI profile PCR was negative, celiac disease panel and H. pylori stool antigen were negative Patient is interested to know if she has any food intolerances, therefore, recommend food allergy profile, alpha gal panel Also, discussed about cutting back on consumption of junk foods Continue Creon 72 K  with each meal and 1 with snack Advised patient to increase to 3 pills as needed and take Imodium as needed, particularly dining out or going out  Follow up in 6 months   Arlyss Repress, MD

## 2022-09-06 LAB — MISC LABCORP TEST (SEND OUT)
LabCorp test name: 650003
LabCorp test name: 602989
Labcorp test code: 650003
Labcorp test code: 602989

## 2022-09-16 ENCOUNTER — Telehealth: Payer: Self-pay | Admitting: Cardiovascular Disease

## 2022-09-16 NOTE — Telephone Encounter (Signed)
Pt c/o BP issue: STAT if pt c/o blurred vision, one-sided weakness or slurred speech  1. What are your last 5 BP readings? 128/66, 117/67, 157/82, 112/50, 123/69  2. Are you having any other symptoms (ex. Dizziness, headache, blurred vision, passed out)? Light headed  3. What is your BP issue? Happening since saturday

## 2022-09-16 NOTE — Telephone Encounter (Signed)
Patient came into the office with her eliquis assistance forms. While here, she had concerns about her blood pressure readings since Saturday. She stated that her blood pressure is normally in the 140/80's. Since Saturday she has had one reading of 95/61 and then other morning readings of 120/60's. While in the office today, her blood pressure was 145/81 with her wrist blood pressure monitor. The patient has been advised that the blood pressures in the 120's are normal.   She takes Losartan 25 mg once daily. She does take this at night. She stated that she has been getting dizzy especially when she rises from a sitting position. She has been advised to rise slowly and wait until the dizziness subsides.   She has also been advised to check her blood pressure 1-2 hours after she has taken the Losartan at night and then again in the morning and to keep a log of these pressures.   She has not had any changes in her diet or medication besides the increase in the Creon. She stated that she has also been told that she has "thyroid and pancreas" issues.

## 2022-09-16 NOTE — Telephone Encounter (Signed)
Patient dropping off PAF placed in box

## 2022-09-19 NOTE — Telephone Encounter (Signed)
Assistance has been faxed.  

## 2022-09-19 NOTE — Telephone Encounter (Signed)
Called the patient and made her aware. She did state that she was feeling much better and will continue to monitor her blood pressure.

## 2022-09-19 NOTE — Telephone Encounter (Signed)
No need to change anything.

## 2022-10-16 ENCOUNTER — Other Ambulatory Visit: Payer: Self-pay | Admitting: Internal Medicine

## 2022-10-16 DIAGNOSIS — I10 Essential (primary) hypertension: Secondary | ICD-10-CM

## 2022-11-06 ENCOUNTER — Telehealth: Payer: Self-pay | Admitting: Gastroenterology

## 2022-11-06 NOTE — Telephone Encounter (Signed)
Pt medical records were sent [facility] [phone]

## 2022-11-26 ENCOUNTER — Other Ambulatory Visit: Payer: Self-pay | Admitting: Internal Medicine

## 2022-12-03 ENCOUNTER — Telehealth: Payer: Self-pay | Admitting: Gastroenterology

## 2022-12-03 MED ORDER — PANCRELIPASE (LIP-PROT-AMYL) 36000-114000 UNITS PO CPEP: ORAL_CAPSULE | ORAL | 10 refills | Status: AC

## 2022-12-03 NOTE — Telephone Encounter (Signed)
Pt called in ref to the adjustment of her creon she would like a callback-[phone]

## 2022-12-03 NOTE — Telephone Encounter (Signed)
Go ahead and send in a new prescription with higher dose  RV

## 2022-12-03 NOTE — Telephone Encounter (Signed)
Called and informed patient we have sent in the higher does of medication. Called [employer] patient assistance and they said yes I can send it through epic and to send it to [facility].

## 2022-12-03 NOTE — Addendum Note (Signed)
Addended by: Ulyess Blossom L on: 12/03/2022 03:13 PM   Modules accepted: Orders

## 2022-12-03 NOTE — Telephone Encounter (Signed)
Patient states that at her appointment on 09/04/2022 she was told to increase the Creon to 3 pills with each meal and 2 pills with each snack. In the office visit note it says increase as needed. She states she has been taking the 3 pills with each meal and 2 with a snack. She states she called to get a refill with the patient assistance and they said they need a new prescription with these instructions. Please advise if you want her on the 3 pills

## 2022-12-13 ENCOUNTER — Encounter: Payer: Self-pay | Admitting: Cardiovascular Disease

## 2022-12-13 ENCOUNTER — Ambulatory Visit: Payer: Medicare HMO | Attending: Cardiovascular Disease | Admitting: Cardiovascular Disease

## 2022-12-13 VITALS — BP 120/64 | HR 81 | Ht 64.0 in | Wt 246.4 lb

## 2022-12-13 DIAGNOSIS — E785 Hyperlipidemia, unspecified: Secondary | ICD-10-CM | POA: Diagnosis not present

## 2022-12-13 DIAGNOSIS — I10 Essential (primary) hypertension: Secondary | ICD-10-CM

## 2022-12-13 DIAGNOSIS — R0609 Other forms of dyspnea: Secondary | ICD-10-CM

## 2022-12-13 DIAGNOSIS — I482 Chronic atrial fibrillation, unspecified: Secondary | ICD-10-CM

## 2022-12-13 NOTE — Progress Notes (Signed)
Cardiology Office Note   Date:  12/13/2022   ID:  Crystal Haas, DOB 21-Dec-1945, MRN [id number]  PCP:  [facility]  Cardiologist:   Kathlyn Sacramento, MD   Chief Complaint  Patient presents with   Other    6 month f/u c/o sob and edema ankles/legs. Meds reviewed verbally with pt.      History of Present Illness: Crystal Haas is a 77 y.o. female who presents for a follow-up visit regarding chronic atrial fibrillation and hypertension.  Echocardiogram in July, 2016 showed normal LV systolic function with mildly dilated right and left atrium.  She is tolerating anticoagulation with Eliquis.   She has known history of sleep apnea on CPAP. She had previous knee surgery.  She was hospitalized in November 2021 with numbness, slurred speech and weakness of the right arm and leg.  Symptoms lasted for about 20 minutes. MRI showed small infarcts in the left insula and overlying frontal operculum with possible infarct in the left corona radiate.    Echocardiogram showed normal LV systolic function with no obvious source of cardiac embolism.  Carotid Doppler showed no significant disease.  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  She reports no chest pain.  She does complain of increased shortness of breath and lower extremity edema.  She also reports recent hypotension and orthostatic dizziness.  Her losartan was held and subsequently resumed.  Blood pressure seems to be controlled today.     Past Medical History:  Diagnosis Date   Anemia    distant past   Anxiety    Arthritis    "everywhere" - big toes worst   Chronic atrial fibrillation ([place])    a. on eliquis; b. 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   Seasonal allergies    takes allergy weekly   Status post bilateral breast reduction 09/06/2016   Overview:  08/29/16   Stroke [facility])     Past Surgical History:  Procedure Laterality Date   BREAST REDUCTION SURGERY Bilateral 08/29/2016   Procedure: BILATERAL MAMMARY REDUCTION  (BREAST)WITH LIPOSUCTION;  Surgeon: Wallace Going, DO;  Location: [place];  Service: Plastics;  Laterality: Bilateral;   CATARACT EXTRACTION W/PHACO Left 05/03/2015   Procedure: CATARACT EXTRACTION PHACO AND INTRAOCULAR LENS PLACEMENT ([place]);  Surgeon: Leandrew Koyanagi, MD;  Location: [place];  Service: Ophthalmology;  Laterality: Left;  CPAP   CATARACT EXTRACTION W/PHACO Right 10/09/2016   Procedure: CATARACT EXTRACTION PHACO AND INTRAOCULAR LENS PLACEMENT (IOC);  Surgeon: Leandrew Koyanagi, MD;  Location: [place];  Service: Ophthalmology;  Laterality: Right;  sleep apnea   CHONDROPLASTY Right 04/29/2016   Procedure: CHONDROPLASTY;  Surgeon: Dereck Leep, MD;  Location: [facility];  Service: Orthopedics;  Laterality: Right;   COLONOSCOPY WITH PROPOFOL N/A 10/24/2018   Procedure: COLONOSCOPY WITH PROPOFOL;  Surgeon: Virgel Manifold, MD;  Location: [facility];  Service: Endoscopy;  Laterality: N/A;   EYE SURGERY     FLEXIBLE SIGMOIDOSCOPY N/A 05/30/2022  Procedure: FLEXIBLE SIGMOIDOSCOPY;  Surgeon: Lin Landsman, MD;  Location: [facility];  Service: Gastroenterology;  Laterality: N/A;   KNEE ARTHROPLASTY Right 03/25/2018   Procedure: COMPUTER ASSISTED TOTAL KNEE ARTHROPLASTY;  Surgeon: Dereck Leep, MD;  Location: [facility];  Service: Orthopedics;  Laterality: Right;   KNEE ARTHROSCOPY WITH LATERAL MENISECTOMY  04/29/2016   Procedure: KNEE ARTHROSCOPY WITH LATERAL MENISECTOMY;  Surgeon: Dereck Leep, MD;  Location: [facility];  Service: Orthopedics;;   KNEE  ARTHROSCOPY WITH MEDIAL MENISECTOMY  04/29/2016   Procedure: KNEE ARTHROSCOPY WITH MEDIAL MENISECTOMY;  Surgeon: Dereck Leep, MD;  Location: [facility];  Service: Orthopedics;;   REDUCTION MAMMAPLASTY Bilateral 09/2016   RETINAL DETACHMENT SURGERY Left May 03, 2015   Dr. Wallace Going, [facility]   TUBAL LIGATION       Current Outpatient Medications  Medication Sig Dispense Refill   apixaban (ELIQUIS) 5 MG TABS tablet Take 1 tablet (5 mg total) by mouth 2 (two) times daily. 60 tablet 11   atorvastatin (LIPITOR) 80 MG tablet TAKE 1 TABLET BY MOUTH EVERYDAY AT BEDTIME 90 tablet 3   azelastine (OPTIVAR) 0.05 % ophthalmic solution Place 1 drop into both eyes 2 (two) times daily.     B Complex-C (B-COMPLEX WITH VITAMIN C) tablet Take 1 tablet by mouth daily.     fluticasone (FLONASE) 50 MCG/ACT nasal spray Place 2 sprays into both nostrils daily. 16 g 0   GLUCOSAMINE-CHONDROITIN DS PO Take 3,000 mg by mouth daily.     losartan (COZAAR) 25 MG tablet TAKE 1 TABLET (25 MG TOTAL) BY MOUTH DAILY. 90 tablet 1   Magnesium 250 MG TABS Take 250 mg by mouth daily.     Multiple Vitamin (MULTIVITAMIN WITH MINERALS) TABS tablet Take 1 tablet by mouth daily. One-A-Day Active 65+     nystatin cream (MYCOSTATIN) Apply 1 application topically 2 (two) times daily. 30 g 0   sertraline (ZOLOFT) 50 MG tablet TAKE 1 TABLET BY MOUTH EVERY DAY 90 tablet 1   triamcinolone cream (KENALOG) 0.1 % Apply 1 application topically 2 (two) times daily. 30 g 0   lipase/protease/amylase (CREON) 36000 UNITS CPEP capsule Take 3 capsules with the first bite of each meal and 2 capsule with the first bite of each snack (Patient not taking: Reported on 12/13/2022) 390 capsule 10   No current facility-administered medications for this visit.    Allergies:   Ivp dye [iodinated contrast media] and Tape    Social History:  The patient  reports that she quit smoking about 53 years ago. Her smoking use included cigarettes. She has a 0.50  pack-year smoking history. She has never used smokeless tobacco. She reports that she does not currently use alcohol. She reports that she does not use drugs.      ROS:  Please see the history of present illness.   Otherwise, review of systems are positive for none.   All other systems are reviewed and negative.    PHYSICAL EXAM: VS:  BP 120/64 (BP Location: Left Arm, Patient Position: Sitting, Cuff Size: Large)   Pulse 81   Ht '5\' 4"'$  (1.626 m)   Wt 246 lb 6 oz (111.8 kg)   SpO2 98%   BMI 42.29 kg/m  , BMI Body mass index is 42.29 kg/m. GEN: Well nourished, well developed, in no acute distress  HEENT: normal  Neck: no JVD, carotid bruits, or masses Cardiac: Irregularly irregular; no rubs, or gallops, .  1/6 systolic murmur in the aortic area.  Mild bilateral leg edema with stasis dermatitis. Respiratory:  clear to auscultation bilaterally, normal work of breathing GI: soft, nontender, nondistended, + BS MS: no deformity or atrophy  Skin: warm and dry, no rash Neuro:  Strength and sensation are intact Psych: euthymic mood, full affect Radial pulses normal bilaterally   EKG:  EKG  ordered today. EKG showed atrial fibrillation with PVCs.  Ventricular rate is 81 bpm.  Low voltage with nonspecific T wave changes.  Recent Labs: 03/28/2022: ALT 15; BUN 13; Creatinine, Ser 0.82; Hemoglobin 13.1; Platelets 151.0; Potassium 4.6; Sodium 141    Lipid Panel    Component Value Date/Time   CHOL 94 09/20/2020 0632   CHOL 186 09/04/2015 1051   TRIG 90 09/20/2020 0632   HDL 43 09/20/2020 0632   HDL 66 09/04/2015 1051   CHOLHDL 2.2 09/20/2020 0632   VLDL 18 09/20/2020 0632   LDLCALC 33 09/20/2020 0632   LDLCALC 99 09/04/2015 1051   LDLDIRECT 57.0 10/31/2020 1054      Wt Readings from Last 3 Encounters:  12/13/22 246 lb 6 oz (111.8 kg)  09/04/22 252 lb 6 oz (114.5 kg)  06/11/22 251 lb 8 oz (114.1 kg)        ASSESSMENT AND PLAN:  1.  Chronic atrial fibrillation: Ventricular  rate is controlled without any medication.   She is tolerating anticoagulation with Eliquis.    2.  Essential hypertension: Blood pressure seems to be controlled on small dose losartan.  She does have intermittent hypotension but recent blood pressure readings appear to be reasonable.  3.   left MCA stroke: No recurrent symptoms.  Continue medical therapy.  4.  Hyperlipidemia: Continue high-dose atorvastatin.  Most recent profile showed an LDL of 57.  5.  Increased dyspnea and lower extremity edema.  I do not see clear evidence of volume overload by physical exam.  I requested an echocardiogram.   Disposition:   FU with me in 6 months.  Signed,  Kathlyn Sacramento, MD  12/13/2022 5:27 PM    [facility]

## 2022-12-13 NOTE — Patient Instructions (Signed)
Medication Instructions:  No changes *If you need a refill on your cardiac medications before your next appointment, please call your pharmacy*   Lab Work: None ordered If you have labs (blood work) drawn today and your tests are completely normal, you will receive your results only by: [place] (if you have MyChart) OR A paper copy in the mail If you have any lab test that is abnormal or we need to change your treatment, we will call you to review the results.   Testing/Procedures: Your physician has requested that you have an echocardiogram. Echocardiography is a painless test that uses sound waves to create images of your heart. It provides your doctor with information about the size and shape of your heart and how well your heart's chambers and valves are working.   You may receive an ultrasound enhancing agent through an IV if needed to better visualize your heart during the echo. This procedure takes approximately one hour.  There are no restrictions for this procedure.  This will take place at [facility] ([place]) [id number], [place]    Follow-Up: At [facility], you and your health needs are our priority.  As part of our continuing mission to provide you with exceptional heart care, we have created designated Provider Care Teams.  These Care Teams include your primary Cardiologist (physician) and Advanced Practice Providers (APPs -  Physician Assistants and Nurse Practitioners) who all work together to provide you with the care you need, when you need it.  We recommend signing up for the patient portal called "MyChart".  Sign up information is provided on this After Visit Summary.  MyChart is used to connect with patients for Virtual Visits (Telemedicine).  Patients are able to view lab/test results, encounter notes, upcoming appointments, etc.  Non-urgent messages can be sent to your provider as well.   To learn more about what you can  do with MyChart, go to [email or web address].    Your next appointment:   6 month(s)  Provider:   You may see Kathlyn Sacramento, MD or one of the following Advanced Practice Providers on your designated Care Team:   Murray Hodgkins, NP Christell Faith, PA-C Cadence Kathlen Mody, PA-C Gerrie Nordmann, NP

## 2022-12-17 ENCOUNTER — Telehealth: Payer: Self-pay | Admitting: Gastroenterology

## 2022-12-17 NOTE — Telephone Encounter (Signed)
Pt left message in ref to her medication creon would like a call [phone]

## 2022-12-17 NOTE — Telephone Encounter (Signed)
Phone number correction [phone]

## 2022-12-17 NOTE — Telephone Encounter (Signed)
Patient states she call yesterday and talk to someone and they said they were going to get a telephone call to me. Did not receive telephone call till today. Patient states she has been out of medication for 2 weeks and her symptoms are back and is having diarrhea several times a day. Informed patient that I would call the patient assistance today when I got done with clinic or first thing in the morning and give her a call back

## 2022-12-17 NOTE — Telephone Encounter (Signed)
Called and left a message for call back for patient  

## 2022-12-18 NOTE — Telephone Encounter (Signed)
Called [employer] patient assistance and they said the medication was shipped out on 12/16/2022 and she should be receiving in the mail by 12/19/2022. Called and informed patient of the delivery and she was very appreciated of the information. She will be looking for the delivery

## 2023-01-03 ENCOUNTER — Other Ambulatory Visit: Payer: Self-pay | Admitting: *Deleted

## 2023-01-03 DIAGNOSIS — Z7901 Long term (current) use of anticoagulants: Secondary | ICD-10-CM

## 2023-01-03 DIAGNOSIS — I482 Chronic atrial fibrillation, unspecified: Secondary | ICD-10-CM

## 2023-01-03 MED ORDER — APIXABAN 5 MG PO TABS: 5.0000 mg | ORAL_TABLET | Freq: Two times a day (BID) | ORAL | 3 refills | Status: DC

## 2023-01-07 ENCOUNTER — Telehealth: Payer: Self-pay | Admitting: Cardiovascular Disease

## 2023-01-07 DIAGNOSIS — Z7901 Long term (current) use of anticoagulants: Secondary | ICD-10-CM

## 2023-01-07 DIAGNOSIS — I482 Chronic atrial fibrillation, unspecified: Secondary | ICD-10-CM

## 2023-01-07 NOTE — Telephone Encounter (Signed)
Patient states she is renewing her application for Eliquis and she received a letter from the company stating they will need the physician's portion completed and a new prescription sent to [facility].  Fax# [phone].  Application Case # [id number].

## 2023-01-08 MED ORDER — APIXABAN 5 MG PO TABS: 5.0000 mg | ORAL_TABLET | Freq: Two times a day (BID) | ORAL | 0 refills | Status: DC

## 2023-01-08 NOTE — Telephone Encounter (Signed)
Patient has been made aware that assistance forms were faxed.  Medication Samples have been provided to the patient.  Drug name: Eliquis       Strength: 5mg        Qty: 2 boxes  LOT: [id number]  Exp.Date: 3/25

## 2023-01-09 ENCOUNTER — Ambulatory Visit: Payer: Medicare HMO | Admitting: Dietician

## 2023-01-10 ENCOUNTER — Encounter: Payer: Medicare HMO | Attending: Internal Medicine | Admitting: Dietician

## 2023-01-10 ENCOUNTER — Encounter: Payer: Self-pay | Admitting: Dietician

## 2023-01-10 VITALS — Ht 64.5 in | Wt 244.3 lb

## 2023-01-10 DIAGNOSIS — K5730 Diverticulosis of large intestine without perforation or abscess without bleeding: Secondary | ICD-10-CM | POA: Insufficient documentation

## 2023-01-10 DIAGNOSIS — Z713 Dietary counseling and surveillance: Secondary | ICD-10-CM | POA: Diagnosis not present

## 2023-01-10 DIAGNOSIS — E785 Hyperlipidemia, unspecified: Secondary | ICD-10-CM | POA: Diagnosis not present

## 2023-01-10 DIAGNOSIS — Z7901 Long term (current) use of anticoagulants: Secondary | ICD-10-CM | POA: Diagnosis not present

## 2023-01-10 DIAGNOSIS — I4891 Unspecified atrial fibrillation: Secondary | ICD-10-CM | POA: Insufficient documentation

## 2023-01-10 DIAGNOSIS — I10 Essential (primary) hypertension: Secondary | ICD-10-CM

## 2023-01-10 DIAGNOSIS — Z6841 Body Mass Index (BMI) 40.0 and over, adult: Secondary | ICD-10-CM

## 2023-01-10 DIAGNOSIS — K8681 Exocrine pancreatic insufficiency: Secondary | ICD-10-CM | POA: Diagnosis not present

## 2023-01-10 DIAGNOSIS — Z79899 Other long term (current) drug therapy: Secondary | ICD-10-CM | POA: Insufficient documentation

## 2023-01-10 NOTE — Patient Instructions (Addendum)
Great job making healthy food choices, keep it up! Work on strategies to help with portion control; try preparing less food to start with. Make sure to chew food thoroughly and make a meal last about 20 minutes to improve fulness.  Keep up walking on the treadmill to burn any extra calories

## 2023-01-10 NOTE — Progress Notes (Signed)
Medical Nutrition Therapy: Visit start time: [id number]  end time: 1430  Assessment:   Referral Diagnosis: EPI, HTN, obesity Other medical history/ diagnoses: sleep apnea Psychosocial issues/ stress concerns: history of depression, anxiety  Medications, supplements: reconciled list in medical record   Preferred learning method:  Visual Hands-on    Current weight: 244.3lbs Height: 5'4.5" BMI: 41.29 Patient's personal weight goal: 150lbs  Progress and evaluation:  Patient states she has had chronic diarrhea for 12 years; was tested multiple times before getting diagnoses of EPI. She repots creon is working, although ran out for a short time when prescription was increased and immediately had diarrhea again.  She was basically homebound for over 1 year due to severity of diarrhea. Reports no self control with food portions, sometimes eats large portions especially in evenings, and feels bad about herself because of this.  BP and cholesterol seem to be controlled with her current medications. Has experienced weight gain due to decreased activity since worsening of diarrhea Does not eat much red meat -- chicken/ salmon; occasional pork roast, Food allergies: cherries Special diet practices: intermittent fasting for weight loss -- starts eating at 12pm daily Patient seeks help with best diet practices for EPI, weight loss Next PCP appt is 01/13/23   Dietary Intake:  Usual eating pattern includes 2 meals and 1 snacks per day. Dining out frequency: 2 meals per week. Who plans meals/ buys groceries? self Who prepares meals? self  Breakfast: none Snack: none Lunch: skinny bagel with roast [place] and swiss cheese, avocado; salad with [employer] honey mustard dressing, with edamame, sometimes with dried cranberries or seeds; occ boiled egg Snack: none Supper: 3-4 vegetables baked potato with salsa; chicken/ fish + 2-3 veg Snack: blue chips;  Beverages: water, coffee 2c with soy  milk  Physical activity: walking on treadmill 20 minutes 2-3 times a week   Intervention:   Nutrition Care Education:   Basic nutrition: basic food groups; appropriate nutrient balance; appropriate meal and snack schedule; general nutrition guidelines    Weight control: importance of low sugar and low fat choices; portion control strategies including preparing less food, eating slowly and chewing foods thoroughly, shifting eating pattern to starting earlier and ending earlier, pre-portioning foods (she tried but didn ot work, Holiday representative diary; estimated energy needs for weight loss at 1400 kcal, provided guidance for 50% CHO, 20% pro, (30% fat) EPI: low fat eating pattern, emphasis on vegetables, fruit, whole grains, beans, and small portions of meats; potential vitamin deficiencies and prevention; need for pancreatic enzyme replacement permanently Other: DASH diet for heart health and pattern to match best practice for EPI  Other intervention notes: Patient has been making healthy foods choices that fit with DASH pattern and suitable for controlling EPI symptoms. She is motivated to continue. Portion control is her primary dietary concern. Established goals with direction from patient. No follow up scheduled at this time due to family, planned time away. She will schedule later as needed.    Nutritional Diagnosis:  [place]-2.1 Inpaired nutrition utilization As related to EPI.  As evidenced by chronic diarrhea controlled with enzyme replacement. [place]-3.3 Overweight/obesity As related to history of excess calories, inadequate physical activity due to chronic diarrhea.  As evidenced by patient with current BMI of 41.29.   Education Materials given:  DASH diet overview and Development worker, community with food lists, sample meal pattern Visit summary with goals/ instructions   Learner/ who was taught:  Patient   Level of understanding: Verbalizes/ demonstrates competency  Demonstrated degree of  understanding via:   Teach back Learning barriers: None  Willingness to learn/ readiness for change: Eager, change in progress  Monitoring and Evaluation:  Dietary intake, exercise, GI symptoms, and body weight      follow up: prn

## 2023-01-21 ENCOUNTER — Telehealth: Payer: Self-pay | Admitting: Cardiovascular Disease

## 2023-01-21 DIAGNOSIS — I482 Chronic atrial fibrillation, unspecified: Secondary | ICD-10-CM

## 2023-01-21 DIAGNOSIS — Z7901 Long term (current) use of anticoagulants: Secondary | ICD-10-CM

## 2023-01-21 MED ORDER — APIXABAN 5 MG PO TABS: 5.0000 mg | ORAL_TABLET | Freq: Two times a day (BID) | ORAL | 1 refills | Status: DC

## 2023-01-21 NOTE — Telephone Encounter (Signed)
 *  STAT* If patient is at the pharmacy, call can be transferred to refill team.   1. Which medications need to be refilled? (please list name of each medication and dose if known)           apixaban (ELIQUIS) 5 MG TABS tablet     2. Which pharmacy/location (including street and city if local pharmacy) is medication to be sent to? [facility]  3. Do they need a 30 day or 90 day supply? 90 days

## 2023-01-21 NOTE — Telephone Encounter (Signed)
Pt last saw Dr Fletcher Anon 12/13/22, last labs 01/12/23 Creat 0.80, age 77, weight 110.8kg, based on specified criteria pt is on appropriate dosage of Eliquis 5mg  BID for afib.  Will refill rx.

## 2023-01-22 ENCOUNTER — Ambulatory Visit: Payer: Medicare HMO

## 2023-01-28 ENCOUNTER — Other Ambulatory Visit: Payer: Medicare HMO

## 2023-02-06 ENCOUNTER — Ambulatory Visit: Payer: Medicare HMO | Attending: Cardiovascular Disease

## 2023-02-06 DIAGNOSIS — R0609 Other forms of dyspnea: Secondary | ICD-10-CM | POA: Diagnosis not present

## 2023-02-06 LAB — ECHOCARDIOGRAM COMPLETE
AR max vel: 1.66 cm2
AV Area VTI: 1.68 cm2
AV Area mean vel: 1.55 cm2
AV Mean grad: 7 mmHg
AV Peak grad: 12.4 mmHg
Ao pk vel: 1.76 m/s
Calc EF: 64.8 %
S' Lateral: 2.2 cm
Single Plane A2C EF: 64.9 %
Single Plane A4C EF: 64.2 %

## 2023-02-26 ENCOUNTER — Ambulatory Visit: Payer: Medicare HMO | Attending: Internal Medicine

## 2023-02-26 ENCOUNTER — Other Ambulatory Visit: Payer: Self-pay

## 2023-02-26 DIAGNOSIS — M6281 Muscle weakness (generalized): Secondary | ICD-10-CM | POA: Diagnosis present

## 2023-02-26 DIAGNOSIS — R293 Abnormal posture: Secondary | ICD-10-CM

## 2023-02-26 DIAGNOSIS — R2689 Other abnormalities of gait and mobility: Secondary | ICD-10-CM | POA: Insufficient documentation

## 2023-02-26 DIAGNOSIS — N3946 Mixed incontinence: Secondary | ICD-10-CM | POA: Diagnosis present
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
## 2023-02-26 NOTE — Patient Instructions (Signed)
TOILET POSTURE: Urination: feet flat, lean forward with forearms on legs to fully empty bladder. Bowel movement: place feet flat on Squatty Potty or stool so knees are higher than hips, lean forward to relax pelvic floor in order to avoid strain.  Water: Start your day with drinking an 8 ounce of water first thing in the morning.

## 2023-03-05 ENCOUNTER — Ambulatory Visit: Payer: Medicare HMO

## 2023-03-26 ENCOUNTER — Ambulatory Visit: Payer: Medicare HMO | Attending: Internal Medicine

## 2023-03-26 ENCOUNTER — Other Ambulatory Visit: Payer: Self-pay

## 2023-03-26 DIAGNOSIS — R293 Abnormal posture: Secondary | ICD-10-CM | POA: Diagnosis present

## 2023-03-26 DIAGNOSIS — M6281 Muscle weakness (generalized): Secondary | ICD-10-CM | POA: Insufficient documentation

## 2023-03-26 DIAGNOSIS — R2689 Other abnormalities of gait and mobility: Secondary | ICD-10-CM | POA: Diagnosis present

## 2023-03-26 DIAGNOSIS — N3946 Mixed incontinence: Secondary | ICD-10-CM | POA: Insufficient documentation
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
## 2023-03-26 NOTE — Patient Instructions (Addendum)
SLEEP: place pillow under lower legs when sleeping on stomach.   SHOES: make sure to wear shoes that have heels straps and support. Heel wedge in L shoe.

## 2023-04-02 ENCOUNTER — Other Ambulatory Visit: Payer: Self-pay

## 2023-04-02 ENCOUNTER — Ambulatory Visit: Payer: Medicare HMO

## 2023-04-02 DIAGNOSIS — M6281 Muscle weakness (generalized): Secondary | ICD-10-CM

## 2023-04-02 DIAGNOSIS — R2689 Other abnormalities of gait and mobility: Secondary | ICD-10-CM

## 2023-04-02 DIAGNOSIS — N3946 Mixed incontinence: Secondary | ICD-10-CM

## 2023-04-02 DIAGNOSIS — R293 Abnormal posture: Secondary | ICD-10-CM
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
## 2023-04-09 ENCOUNTER — Other Ambulatory Visit: Payer: Self-pay

## 2023-04-09 ENCOUNTER — Ambulatory Visit: Payer: Medicare HMO

## 2023-04-09 DIAGNOSIS — R293 Abnormal posture: Secondary | ICD-10-CM

## 2023-04-09 DIAGNOSIS — N3946 Mixed incontinence: Secondary | ICD-10-CM | POA: Diagnosis not present

## 2023-04-09 DIAGNOSIS — M6281 Muscle weakness (generalized): Secondary | ICD-10-CM

## 2023-04-09 DIAGNOSIS — R2689 Other abnormalities of gait and mobility: Secondary | ICD-10-CM
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
## 2023-04-14 NOTE — Progress Notes (Addendum)
Celso Amy, PA-C [address]  [address]  [place], [place] [address]  Main: [phone]  Fax: [phone]   Primary Care Physician: No primary care provider on file.  Primary Gastroenterologist:  Dr. Lannette Donath / Celso Amy, PA-C   CC:  Annual follow-up Exocrine Pancreatic Insufficiency  HPI: Crystal Haas is a 77 y.o. female returns for annual follow-up of pancreatic insufficiency.  She has history of chronic diarrhea for over 15 years.  GI workup by Dr. Allegra Lai 04/2022 showed very low pancreatic fecal elastase of 75, consistent with severe pancreatic insufficiency.  She was started on Creon.  GI pathogen panel was negative.  Celiac labs were negative.  She last saw Dr. Allegra Lai 08/2022, and her diarrhea had improved on Creon.  She takes 36,000 lipase units 3 capsules with each meal and 2 capsules with each snack.  She requires patient assistance program for Creon.  Colonoscopy 10/2018, found to have sigmoid diverticulosis, internal hemorrhoids   Colonoscopy in 2014, to evaluate chronic diarrhea, showed biopsies negative for microscopic colitis.  Current Outpatient Medications  Medication Sig Dispense Refill   apixaban (ELIQUIS) 5 MG TABS tablet Take 1 tablet (5 mg total) by mouth 2 (two) times daily. 180 tablet 1   atorvastatin (LIPITOR) 80 MG tablet TAKE 1 TABLET BY MOUTH EVERYDAY AT BEDTIME 90 tablet 3   B Complex-C (B-COMPLEX WITH VITAMIN C) tablet Take 1 tablet by mouth daily.     cholecalciferol 25 MCG (1000 UT) tablet Take by mouth.     Dextran 70-Hypromellose (ARTIFICIAL TEARS) 0.1-0.3 % SOLN Apply to eye.     fexofenadine (ALLEGRA) 180 MG tablet Take by mouth.     fluticasone (FLONASE) 50 MCG/ACT nasal spray Place 2 sprays into both nostrils daily. 16 g 0   GLUCOSAMINE-CHONDROITIN DS PO Take 3,000 mg by mouth daily.     lipase/protease/amylase (CREON) 36000 UNITS CPEP capsule Take 3 capsules with the first bite of each meal and 2 capsule with the first bite of each  snack 390 capsule 10   losartan (COZAAR) 25 MG tablet TAKE 1 TABLET (25 MG TOTAL) BY MOUTH DAILY. 90 tablet 1   Magnesium 250 MG TABS Take 250 mg by mouth daily.     Multiple Vitamin (MULTIVITAMIN WITH MINERALS) TABS tablet Take 1 tablet by mouth daily. One-A-Day Active 65+     nystatin cream (MYCOSTATIN) Apply 1 application topically 2 (two) times daily. 30 g 0   Olopatadine HCl 0.2 % SOLN Apply to eye.     sertraline (ZOLOFT) 50 MG tablet TAKE 1 TABLET BY MOUTH EVERY DAY (Patient not taking: Reported on 03/26/2023) 90 tablet 1   triamcinolone cream (KENALOG) 0.1 % Apply 1 application topically 2 (two) times daily. 30 g 0   No current facility-administered medications for this visit.    Allergies as of 04/15/2023 - Review Complete 04/09/2023  Allergen Reaction Noted   Apple Anaphylaxis 05/03/2015   Ivp dye [iodinated contrast media] Shortness Of Breath 04/28/2015   Tape Other (See Comments) 04/28/2015   Wound dressing adhesive Itching 04/28/2015    Past Medical History:  Diagnosis Date   Anemia    distant past   Anxiety    Arthritis    "everywhere" - big toes worst   Chronic atrial fibrillation (HCC)    a. on eliquis; b. 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   Seasonal allergies    takes allergy weekly   Status post bilateral breast reduction 09/06/2016   Overview:  08/29/16   Stroke [facility])     Past Surgical History:  Procedure Laterality Date   BREAST REDUCTION SURGERY Bilateral 08/29/2016   Procedure: BILATERAL MAMMARY REDUCTION  (BREAST)WITH LIPOSUCTION;  Surgeon: Peggye Form, DO;  Location: [facility];  Service: Plastics;  Laterality: Bilateral;   CATARACT EXTRACTION W/PHACO Left 05/03/2015   Procedure: CATARACT  EXTRACTION PHACO AND INTRAOCULAR LENS PLACEMENT (IOC);  Surgeon: Lockie Mola, MD;  [facility];  Service: Ophthalmology;  Laterality: Left;  CPAP   CATARACT EXTRACTION W/PHACO Right 10/09/2016   Procedure: CATARACT EXTRACTION PHACO AND INTRAOCULAR LENS PLACEMENT (IOC);  Surgeon: Lockie Mola, MD;  Location: [facility];  Service: Ophthalmology;  Laterality: Right;  sleep apnea   CHONDROPLASTY Right 04/29/2016   Procedure: CHONDROPLASTY;  Surgeon: Donato Heinz, MD;  Location: [facility];  Service: Orthopedics;  Laterality: Right;   COLONOSCOPY WITH PROPOFOL N/A 10/24/2018   Procedure: COLONOSCOPY WITH PROPOFOL;  Surgeon: Pasty Spillers, MD;  Location: [facility];  Service: Endoscopy;  Laterality: N/A;   EYE SURGERY     FLEXIBLE SIGMOIDOSCOPY N/A 05/30/2022   Procedure: FLEXIBLE SIGMOIDOSCOPY;  Surgeon: Toney Reil, MD;  Location: [facility];  Service: Gastroenterology;  Laterality: N/A;   KNEE ARTHROPLASTY Right 03/25/2018   Procedure: COMPUTER ASSISTED TOTAL KNEE ARTHROPLASTY;  Surgeon: Donato Heinz, MD;  Location: [facility];  Service: Orthopedics;  Laterality: Right;   KNEE ARTHROSCOPY WITH LATERAL MENISECTOMY  04/29/2016   Procedure: KNEE ARTHROSCOPY WITH LATERAL MENISECTOMY;  Surgeon: Donato Heinz, MD;  Location: [facility];  Service: Orthopedics;;   KNEE ARTHROSCOPY WITH MEDIAL MENISECTOMY  04/29/2016   Procedure: KNEE ARTHROSCOPY WITH MEDIAL MENISECTOMY;  Surgeon: Donato Heinz, MD;  Location: [facility];  Service: Orthopedics;;   REDUCTION MAMMAPLASTY Bilateral 09/2016   RETINAL DETACHMENT SURGERY Left May 03, 2015   Dr. Inez Pilgrim, [facility] [address]   TUBAL LIGATION      Review of Systems:    All systems reviewed and negative except where noted in HPI.   Physical Examination:   There were no vitals taken for this visit.  General: Well-nourished, well-developed in no acute distress.  Eyes: No icterus. Conjunctivae pink. Mouth:  Oropharyngeal mucosa moist and pink , no lesions erythema or exudate. Lungs: Clear to auscultation bilaterally. Non-labored. Heart: Regular rate and rhythm, no murmurs rubs or gallops.  Abdomen: Bowel sounds are normal; Abdomen is Soft; No hepatosplenomegaly, masses or hernias;  No Abdominal Tenderness; No guarding or rebound tenderness. Extremities: No lower extremity edema. No clubbing or deformities. Neuro: Alert and oriented x 3.  Grossly intact. Skin: Warm and dry, no jaundice.   Psych: Alert and cooperative, normal mood and affect.   Imaging Studies: No results found.  Assessment and Plan:   Crystal Haas is a 77 y.o. y/o female presents for annual follow-up of chronic diarrhea attributed to severe pancreatic insufficiency.  Improved on Creon 36,000 lipase units 3 with each meal and 2 with each snack.  1.  Chronic diarrhea 2.  Severe exocrine pancreatic insufficiency  I will refill Creon 36,000 lipase units 3 with each meal and 2 with each snack.  Celso Amy, PA-C  Follow up in 1 year.

## 2023-04-15 ENCOUNTER — Ambulatory Visit: Payer: Medicare HMO | Admitting: Physician Assistant

## 2023-04-15 ENCOUNTER — Encounter: Payer: Self-pay | Admitting: Physician Assistant

## 2023-04-15 VITALS — BP 172/86 | HR 80 | Temp 97.8°F | Ht 65.0 in | Wt 244.0 lb

## 2023-04-15 DIAGNOSIS — K8681 Exocrine pancreatic insufficiency: Secondary | ICD-10-CM | POA: Diagnosis not present

## 2023-04-16 ENCOUNTER — Ambulatory Visit: Payer: Medicare HMO

## 2023-04-16 ENCOUNTER — Other Ambulatory Visit: Payer: Self-pay

## 2023-04-16 DIAGNOSIS — N3946 Mixed incontinence: Secondary | ICD-10-CM

## 2023-04-16 DIAGNOSIS — R2689 Other abnormalities of gait and mobility: Secondary | ICD-10-CM

## 2023-04-16 DIAGNOSIS — R293 Abnormal posture: Secondary | ICD-10-CM

## 2023-04-16 DIAGNOSIS — M6281 Muscle weakness (generalized): Secondary | ICD-10-CM
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
## 2023-04-30 ENCOUNTER — Other Ambulatory Visit: Payer: Self-pay

## 2023-04-30 ENCOUNTER — Ambulatory Visit: Payer: Medicare HMO | Attending: Internal Medicine

## 2023-04-30 DIAGNOSIS — R2689 Other abnormalities of gait and mobility: Secondary | ICD-10-CM | POA: Diagnosis present

## 2023-04-30 DIAGNOSIS — R293 Abnormal posture: Secondary | ICD-10-CM | POA: Insufficient documentation

## 2023-04-30 DIAGNOSIS — N3946 Mixed incontinence: Secondary | ICD-10-CM | POA: Insufficient documentation

## 2023-04-30 DIAGNOSIS — M6281 Muscle weakness (generalized): Secondary | ICD-10-CM | POA: Diagnosis present
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
## 2023-05-05 ENCOUNTER — Telehealth: Payer: Self-pay

## 2023-05-05 NOTE — Telephone Encounter (Signed)
Patient left a message and states she called [employer] 2 weeks ago for her patient assistance creon and she still has not received the medication. She states that she called them today and they have no record she called in a refill

## 2023-05-05 NOTE — Telephone Encounter (Signed)
Tried to call the patient assistance program with [facility]ass and was on hold for 45 minutes and unable to talk to anyone. Called patient back and she states they just called her and they are going to over night the medication to her

## 2023-05-07 ENCOUNTER — Ambulatory Visit: Payer: Medicare HMO

## 2023-05-07 ENCOUNTER — Other Ambulatory Visit: Payer: Self-pay

## 2023-05-07 DIAGNOSIS — N3946 Mixed incontinence: Secondary | ICD-10-CM

## 2023-05-07 DIAGNOSIS — R293 Abnormal posture: Secondary | ICD-10-CM

## 2023-05-07 DIAGNOSIS — R2689 Other abnormalities of gait and mobility: Secondary | ICD-10-CM

## 2023-05-07 DIAGNOSIS — M6281 Muscle weakness (generalized): Secondary | ICD-10-CM
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
## 2023-05-24 ENCOUNTER — Other Ambulatory Visit: Payer: Self-pay | Admitting: Internal Medicine

## 2023-05-24 DIAGNOSIS — E782 Mixed hyperlipidemia: Secondary | ICD-10-CM

## 2023-06-13 ENCOUNTER — Other Ambulatory Visit: Payer: Self-pay | Admitting: Medical Genetics

## 2023-06-13 DIAGNOSIS — Z006 Encounter for examination for normal comparison and control in clinical research program: Secondary | ICD-10-CM

## 2023-06-27 ENCOUNTER — Other Ambulatory Visit: Payer: Self-pay | Admitting: Cardiovascular Disease

## 2023-06-27 DIAGNOSIS — I4891 Unspecified atrial fibrillation: Secondary | ICD-10-CM

## 2023-06-27 DIAGNOSIS — I482 Chronic atrial fibrillation, unspecified: Secondary | ICD-10-CM

## 2023-06-27 DIAGNOSIS — Z7901 Long term (current) use of anticoagulants: Secondary | ICD-10-CM

## 2023-06-30 NOTE — Telephone Encounter (Signed)
Prescription refill request for Eliquis received. Indication: Afib  Last office visit: 12/13/22 Kirke Corin)  Scr: 0.80 (01/12/23)  Age: 77 Weight: 110.7kg  Appropriate dose. Refill sent.

## 2023-08-14 ENCOUNTER — Telehealth: Payer: Self-pay | Admitting: Gastroenterology

## 2023-08-14 NOTE — Telephone Encounter (Signed)
Patient called and left a voicemail to schedule appointment with the PA. I called the patient back and told her she has an appointment with the PA 09/16/23 at 10:15 am.

## 2023-08-15 ENCOUNTER — Ambulatory Visit
Admission: RE | Admit: 2023-08-15 | Discharge: 2023-08-15 | Disposition: A | Discharge status: Home / Self Care | Payer: Medicare HMO | Source: Ambulatory Visit | Attending: Internal Medicine | Admitting: *Deleted

## 2023-08-15 ENCOUNTER — Other Ambulatory Visit: Payer: Self-pay | Admitting: Internal Medicine

## 2023-08-15 ENCOUNTER — Ambulatory Visit
Admission: RE | Admit: 2023-08-15 | Discharge: 2023-08-15 | Disposition: A | Discharge status: Home / Self Care | Payer: Medicare HMO | Source: Ambulatory Visit | Attending: Internal Medicine | Admitting: Internal Medicine

## 2023-08-15 DIAGNOSIS — M79674 Pain in right toe(s): Secondary | ICD-10-CM

## 2023-08-22 ENCOUNTER — Telehealth: Payer: Self-pay

## 2023-08-22 NOTE — Telephone Encounter (Signed)
Called patient to see if she could come by our office to fill out her portion of the application form for Creon so we could get her approved for 2025. She states she has already sent in her portion they are just waiting on the provider portion. Informed her we have not received anything by fax but I would get this sent in for her. Faxed the application to the patient assistance program

## 2023-09-02 NOTE — Telephone Encounter (Signed)
Called the patient assistance for patient creon and they have not received anything from the patient or did not receive the fax from [place]. Called patient and patient states she thought they told her she has been approved for 2025 informed her no that they have not received anything. She states she will come by our office and sign the application form.

## 2023-09-02 NOTE — Telephone Encounter (Signed)
Patient came to sign the application form and I faxed the form to the [place] patient assistance company

## 2023-09-08 NOTE — Telephone Encounter (Signed)
Called patient assistance to check if they received the application and they received the application on 09/04/2023 and it is under review still

## 2023-09-15 ENCOUNTER — Other Ambulatory Visit: Payer: Self-pay

## 2023-09-15 NOTE — Telephone Encounter (Signed)
Patient has been approved for creon through 09/11/2024

## 2023-09-15 NOTE — Progress Notes (Unsigned)
Celso Amy, PA-C [address]  [address]  [place], [place] [address]  Main: [phone]  Fax: [phone]   Primary Care Physician: Louis Matte, MD  Primary Gastroenterologist:  Celso Amy, PA-C / Dr. Lannette Donath    CC: Follow-up exocrine pancreatic insufficiency, chronic diarrhea  HPI: Crystal Haas is a 77 y.o. female returns for 69-month follow-up of exocrine pancreatic insufficiency and chronic diarrhea.  History of chronic diarrhea over 15 years.  She has been taking Creon 36,000 lipase units 3 with each meal and 2 with each snack.  She was also started on FiberCon 2 tablets twice daily and OTC Imodium as needed.  Patient states she is not currently taking FiberCon or Imodium, not needed.  In the past few months she was started on Mounjaro to help with weight loss.  Diarrhea improved and resolved after she started [facility].  She denies any current GI symptoms such as abdominal pain, nausea, vomiting, constipation.  She has had a 30 pound intentional weight loss since starting Mounjaro.  She has changed her diet.  Currently having 2 formed stools per day.  Previous GI workup by Dr. Allegra Lai included negative celiac labs and normal colonoscopies.  Biopsies negative for microscopic colitis.  Flexible sigmoidoscopy 05/2022 by Dr. Allegra Lai: Severe rectosigmoid diverticulosis, otherwise normal.  Biopsies negative for microscopic colitis.  Colonoscopy 10/2018, found to have sigmoid diverticulosis, internal hemorrhoids    Colonoscopy in 2014, to evaluate chronic diarrhea, showed biopsies negative for microscopic colitis.  Current Outpatient Medications  Medication Sig Dispense Refill   apixaban (ELIQUIS) 5 MG TABS tablet TAKE 1 TABLET TWICE DAILY 180 tablet 1   atorvastatin (LIPITOR) 80 MG tablet TAKE 1 TABLET BY MOUTH EVERYDAY AT BEDTIME 90 tablet 3   Azelastine HCl 137 MCG/SPRAY SOLN Place into both nostrils.     B Complex-C (B-COMPLEX WITH VITAMIN C) tablet Take 1 tablet  by mouth daily.     baclofen (LIORESAL) 10 MG tablet Take 10 mg by mouth daily.     cholecalciferol 25 MCG (1000 UT) tablet Take by mouth.     clobetasol cream (TEMOVATE) 0.05 % Apply topically.     Dextran 70-Hypromellose (ARTIFICIAL TEARS) 0.1-0.3 % SOLN Apply to eye.     diclofenac Sodium (VOLTAREN) 1 % GEL Apply topically.     fexofenadine (ALLEGRA) 180 MG tablet Take by mouth.     fluticasone (FLONASE) 50 MCG/ACT nasal spray Place 2 sprays into both nostrils daily. 16 g 0   GLUCOSAMINE-CHONDROITIN DS PO Take 3,000 mg by mouth daily.     HYDROcodone-acetaminophen (NORCO/VICODIN) 5-325 MG tablet Take 1 tablet by mouth every 4 (four) hours as needed.     lipase/protease/amylase (CREON) 36000 UNITS CPEP capsule Take 3 capsules with the first bite of each meal and 2 capsule with the first bite of each snack 390 capsule 10   losartan (COZAAR) 25 MG tablet TAKE 1 TABLET (25 MG TOTAL) BY MOUTH DAILY. 90 tablet 1   Magnesium 250 MG TABS Take 250 mg by mouth daily.     Multiple Vitamin (MULTIVITAMIN WITH MINERALS) TABS tablet Take 1 tablet by mouth daily. One-A-Day Active 65+     nystatin cream (MYCOSTATIN) Apply 1 application topically 2 (two) times daily. 30 g 0   Olopatadine HCl 0.2 % SOLN Apply to eye.     sertraline (ZOLOFT) 50 MG tablet TAKE 1 TABLET BY MOUTH EVERY DAY 90 tablet 1   tirzepatide (MOUNJARO) 7.5 MG/0.5ML Pen Inject 7.5 mg into the  skin once a week.     triamcinolone cream (KENALOG) 0.1 % Apply 1 application topically 2 (two) times daily. 30 g 0   No current facility-administered medications for this visit.    Allergies as of 09/16/2023 - Review Complete 09/16/2023  Allergen Reaction Noted   Apple Anaphylaxis 05/03/2015   Ivp dye [iodinated contrast media] Shortness Of Breath 04/28/2015   Tape Other (See Comments) 04/28/2015   Wound dressing adhesive Itching 04/28/2015    Past Medical History:  Diagnosis Date   Anemia    distant past   Anxiety    Arthritis     "everywhere" - big toes worst   Chronic atrial fibrillation (HCC)    a. on eliquis; b. 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   Seasonal allergies    takes allergy weekly   Status post bilateral breast reduction 09/06/2016   Overview:  08/29/16   Stroke [facility])     Past Surgical History:  Procedure Laterality Date   BREAST REDUCTION SURGERY Bilateral 08/29/2016   Procedure: BILATERAL MAMMARY REDUCTION  (BREAST)WITH LIPOSUCTION;  Surgeon: Peggye Form, DO;  Location: [facility];  Service: Plastics;  Laterality: Bilateral;   CATARACT EXTRACTION W/PHACO Left 05/03/2015   Procedure: CATARACT EXTRACTION PHACO AND INTRAOCULAR LENS PLACEMENT (IOC);  Surgeon: Lockie Mola, MD;  Location: [facility];  Service: Ophthalmology;  Laterality: Left;  CPAP   CATARACT EXTRACTION W/PHACO Right 10/09/2016   Procedure: CATARACT EXTRACTION PHACO AND INTRAOCULAR LENS PLACEMENT (IOC);  Surgeon: Lockie Mola, MD;  Location: [facility];  Service: Ophthalmology;  Laterality: Right;  sleep apnea   CHONDROPLASTY Right 04/29/2016   Procedure: CHONDROPLASTY;  Surgeon: Donato Heinz, MD;  Location: [facility];  Service: Orthopedics;  Laterality: Right;   COLONOSCOPY WITH PROPOFOL N/A 10/24/2018   Procedure: COLONOSCOPY WITH PROPOFOL;  Surgeon: Pasty Spillers, MD;  Location: [facility];  Service: Endoscopy;  Laterality: N/A;   EYE SURGERY     FLEXIBLE SIGMOIDOSCOPY N/A 05/30/2022   Procedure: FLEXIBLE SIGMOIDOSCOPY;  Surgeon: Toney Reil, MD;  Location: [facility];  Service: Gastroenterology;  Laterality: N/A;   KNEE ARTHROPLASTY Right 03/25/2018   Procedure: COMPUTER ASSISTED TOTAL KNEE ARTHROPLASTY;  Surgeon: Donato Heinz,  MD;  Location: [facility];  Service: Orthopedics;  Laterality: Right;   KNEE ARTHROSCOPY WITH LATERAL MENISECTOMY  04/29/2016   Procedure: KNEE ARTHROSCOPY WITH LATERAL MENISECTOMY;  Surgeon: Donato Heinz, MD;  Location: [facility];  Service: Orthopedics;;   KNEE ARTHROSCOPY WITH MEDIAL MENISECTOMY  04/29/2016   Procedure: KNEE ARTHROSCOPY WITH MEDIAL MENISECTOMY;  Surgeon: Donato Heinz, MD;  Location: [facility];  Service: Orthopedics;;   REDUCTION MAMMAPLASTY Bilateral 09/2016   RETINAL DETACHMENT SURGERY Left May 03, 2015   Dr. Inez Pilgrim, [facility]   TUBAL LIGATION      Review of Systems:    All systems reviewed and negative except where noted in HPI.   Physical Examination:   BP (!) 143/78   Pulse 73   Temp 98.2 F (36.8 C)   Ht 5\' 5"  (1.651 m)   Wt 213 lb 9.6 oz (96.9 kg)   BMI 35.54 kg/m  General: Well-nourished, well-developed in no acute distress.  Neuro: Alert and oriented x 3.  Grossly intact.  Psych: Alert and cooperative, normal mood and affect.  Imaging Studies: No results found.  Assessment and Plan:   Crystal Haas is a 77 y.o. y/o female returns for follow-up chronic diarrhea and severe exocrine pancreatic insufficiency.  Diarrhea has currently resolved.  She has no GI symptoms at present.  1.  Exocrine pancreatic insufficiency - Controlled.  Continue Creon 36,000 lipase units 3 with each meal and 2 with each snack.  2.  Chronic diarrhea - Resolved / Controlled  She started Mounjaro in the past few months to help with weight loss.  Her diarrhea improved and resolved since starting Mounjaro.  Celso Amy, PA-C  Follow up 1 year or sooner if she has recurrent GI symptoms.

## 2023-09-16 ENCOUNTER — Ambulatory Visit: Payer: Medicare HMO | Admitting: Physician Assistant

## 2023-09-16 ENCOUNTER — Encounter: Payer: Self-pay | Admitting: Physician Assistant

## 2023-09-16 VITALS — BP 143/78 | HR 73 | Temp 98.2°F | Ht 65.0 in | Wt 213.6 lb

## 2023-09-16 DIAGNOSIS — K8681 Exocrine pancreatic insufficiency: Secondary | ICD-10-CM

## 2023-10-23 ENCOUNTER — Telehealth: Payer: Self-pay | Admitting: Cardiovascular Disease

## 2023-10-23 ENCOUNTER — Encounter: Payer: Self-pay | Admitting: Cardiovascular Disease

## 2023-10-23 ENCOUNTER — Other Ambulatory Visit (HOSPITAL_COMMUNITY): Payer: Self-pay

## 2023-10-23 ENCOUNTER — Other Ambulatory Visit: Payer: Self-pay

## 2023-10-23 DIAGNOSIS — I4891 Unspecified atrial fibrillation: Secondary | ICD-10-CM

## 2023-10-23 MED ORDER — APIXABAN 5 MG PO TABS
5.0000 mg | ORAL_TABLET | Freq: Two times a day (BID) | ORAL | 1 refills | Status: DC
  Filled 2023-10-23: qty 180, 90d supply, fill #0

## 2023-10-23 NOTE — Telephone Encounter (Signed)
*  STAT* If patient is at the pharmacy, call can be transferred to refill team.   1. Which medications need to be refilled? (please list name of each medication and dose if known)  apixaban  (ELIQUIS ) 5 MG TABS tablet  2. Which pharmacy/location (including street and city if local pharmacy) is medication to be sent to? [facility] - [address] [place], [place], [place] [address]  3. Do they need a 30 day or 90 day supply?   30 day supply

## 2023-10-23 NOTE — Telephone Encounter (Signed)
 Prescription refill request for Eliquis received. Indication:afib Last office visit: 12/13/22 Scr: 0.85 [facility] 08/05/23 Age: 78 Weight: 96kg

## 2023-10-24 ENCOUNTER — Other Ambulatory Visit (HOSPITAL_COMMUNITY): Payer: Self-pay

## 2023-10-24 MED ORDER — APIXABAN 5 MG PO TABS: 5.0000 mg | ORAL_TABLET | Freq: Two times a day (BID) | ORAL | 1 refills | Status: DC

## 2023-10-27 ENCOUNTER — Telehealth: Payer: Self-pay | Admitting: Cardiovascular Disease

## 2023-10-27 NOTE — Telephone Encounter (Signed)
 Patient dropped off patient assistance form. Placed in Pam's box/waiting in lobby for consultation.

## 2023-10-27 NOTE — Telephone Encounter (Signed)
 Patient in to drop off application for assistance. Reviewed income and out of pocket pharmacy expenses. She provided all information with no further questions at this time.   Application completed and faxed to company

## 2023-11-25 ENCOUNTER — Encounter: Payer: Self-pay | Admitting: Cardiovascular Disease

## 2023-11-25 ENCOUNTER — Ambulatory Visit
Payer: No Typology Code available for payment source | Attending: Cardiovascular Disease | Admitting: Cardiovascular Disease

## 2023-11-25 ENCOUNTER — Encounter: Payer: Self-pay | Admitting: *Deleted

## 2023-11-25 VITALS — BP 150/90 | HR 85 | Ht 65.0 in | Wt 214.8 lb

## 2023-11-25 DIAGNOSIS — E785 Hyperlipidemia, unspecified: Secondary | ICD-10-CM

## 2023-11-25 DIAGNOSIS — I10 Essential (primary) hypertension: Secondary | ICD-10-CM | POA: Diagnosis not present

## 2023-11-25 DIAGNOSIS — I482 Chronic atrial fibrillation, unspecified: Secondary | ICD-10-CM | POA: Diagnosis not present

## 2023-11-25 MED ORDER — APIXABAN 5 MG PO TABS: 5.0000 mg | ORAL_TABLET | Freq: Two times a day (BID) | ORAL | 11 refills | Status: DC

## 2023-11-25 NOTE — Patient Instructions (Signed)
 Medication Instructions:  No changes *If you need a refill on your cardiac medications before your next appointment, please call your pharmacy*   Lab Work: None ordered If you have labs (blood work) drawn today and your tests are completely normal, you will receive your results only by: MyChart Message (if you have MyChart) OR A paper copy in the mail If you have any lab test that is abnormal or we need to change your treatment, we will call you to review the results.   Testing/Procedures: None ordered   Follow-Up: At [facility], you and your health needs are our priority.  As part of our continuing mission to provide you with exceptional heart care, we have created designated Provider Care Teams.  These Care Teams include your primary Cardiologist (physician) and Advanced Practice Providers (APPs -  Physician Assistants and Nurse Practitioners) who all work together to provide you with the care you need, when you need it.  We recommend signing up for the patient portal called "MyChart".  Sign up information is provided on this After Visit Summary.  MyChart is used to connect with patients for Virtual Visits (Telemedicine).  Patients are able to view lab/test results, encounter notes, upcoming appointments, etc.  Non-urgent messages can be sent to your provider as well.   To learn more about what you can do with MyChart, go to [email or web address].    Your next appointment:   12 month(s)  Provider:   You may see Lorine Bears, MD or one of the following Advanced Practice Providers on your designated Care Team:   Nicolasa Ducking, NP Eula Listen, PA-C Cadence Fransico Michael, PA-C Charlsie Quest, NP Carlos Levering, NP

## 2023-11-25 NOTE — Progress Notes (Signed)
 Cardiology Office Note   Date:  11/25/2023   ID:  Tiombe Tomeo, DOB 19-Mar-1946, MRN [id number]  PCP:  Sampson Ethridge LABOR, MD  Cardiologist:   Deatrice Cage, MD   Chief Complaint  Patient presents with   Follow-up    Patient denies new or acute cardiac problems/concerns today.        History of Present Illness: Crystal Haas is a 78 y.o. female who presents for a follow-up visit regarding chronic atrial fibrillation and hypertension.  Echocardiogram in July, 2016 showed normal LV systolic function with mildly dilated right and left atrium.  She is tolerating anticoagulation with Eliquis .   She has known history of sleep apnea on CPAP. She had previous knee surgery.  She was hospitalized in November 2021 with numbness, slurred speech and weakness of the right arm and leg.  Symptoms lasted for about 20 minutes. MRI showed small infarcts in the left insula and overlying frontal operculum with possible infarct in the left corona radiate.    Echocardiogram showed normal LV systolic function with no obvious source of cardiac embolism.  Carotid Doppler showed no significant disease.  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  She has been doing very well with no chest pain, shortness of breath or palpitations.  The dose of losartan  was decreased by her primary care physician due to hypotension.  She is mildly hypertensive today but she reports being stressed about something.     Past Medical History:  Diagnosis Date   Anemia    distant past   Anxiety    Arthritis    everywhere - big toes worst   Chronic atrial fibrillation (HCC)    a. on eliquis ; b. 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   Seasonal allergies    takes allergy weekly   Status post bilateral breast reduction 09/06/2016   Overview:  08/29/16   Stroke [facility])     Past Surgical History:  Procedure Laterality Date   BREAST REDUCTION SURGERY Bilateral 08/29/2016   Procedure: BILATERAL MAMMARY REDUCTION  (BREAST)WITH LIPOSUCTION;  Surgeon: Estefana GORMAN Fritter, DO;  Location: [facility];  Service: Plastics;  Laterality: Bilateral;   CATARACT EXTRACTION W/PHACO Left 05/03/2015   Procedure: CATARACT EXTRACTION PHACO AND INTRAOCULAR LENS PLACEMENT (IOC);  Surgeon: Dene Etienne, MD;  Location: [facility];  Service: Ophthalmology;  Laterality: Left;  CPAP   CATARACT EXTRACTION W/PHACO Right 10/09/2016   Procedure: CATARACT EXTRACTION PHACO AND INTRAOCULAR LENS PLACEMENT (IOC);  Surgeon: Dene Etienne, MD;  Location: [facility];  Service: Ophthalmology;  Laterality: Right;  sleep apnea   CHONDROPLASTY Right 04/29/2016   Procedure: CHONDROPLASTY;  Surgeon: Lynwood SHAUNNA Hue, MD;  Location: [facility];  Service: Orthopedics;  Laterality: Right;   COLONOSCOPY WITH PROPOFOL  N/A 10/24/2018   Procedure: COLONOSCOPY WITH PROPOFOL ;  Surgeon: Janalyn Keene NOVAK, MD;  Location: [facility];  Service: Endoscopy;  Laterality: N/A;   EYE SURGERY     FLEXIBLE SIGMOIDOSCOPY N/A 05/30/2022   Procedure: FLEXIBLE  SIGMOIDOSCOPY;  Surgeon: Unk Corinn Skiff, MD;  Location: [facility];  Service: Gastroenterology;  Laterality: N/A;   KNEE ARTHROPLASTY Right 03/25/2018   Procedure: COMPUTER ASSISTED TOTAL KNEE ARTHROPLASTY;  Surgeon: Mardee Lynwood SQUIBB, MD;  Location: [facility];  Service: Orthopedics;  Laterality: Right;   KNEE ARTHROSCOPY WITH LATERAL MENISECTOMY  04/29/2016   Procedure: KNEE ARTHROSCOPY WITH LATERAL MENISECTOMY;  Surgeon: Lynwood SQUIBB Mardee, MD;  Location: [facility];  Service: Orthopedics;;   KNEE ARTHROSCOPY WITH MEDIAL MENISECTOMY   04/29/2016   Procedure: KNEE ARTHROSCOPY WITH MEDIAL MENISECTOMY;  Surgeon: Lynwood SQUIBB Mardee, MD;  Location: [facility];  Service: Orthopedics;;   REDUCTION MAMMAPLASTY Bilateral 09/2016   RETINAL DETACHMENT SURGERY Left May 03, 2015   Dr. Mittie, [facility]   TUBAL LIGATION       Current Outpatient Medications  Medication Sig Dispense Refill   apixaban  (ELIQUIS ) 5 MG TABS tablet Take 1 tablet (5 mg total) by mouth 2 (two) times daily. 180 tablet 1   atorvastatin  (LIPITOR) 40 MG tablet Take 40 mg by mouth daily.     Azelastine HCl 137 MCG/SPRAY SOLN Place into both nostrils.     B Complex-C (B-COMPLEX WITH VITAMIN C) tablet Take 1 tablet by mouth daily.     baclofen (LIORESAL) 10 MG tablet Take 10 mg by mouth daily.     cholecalciferol  25 MCG (1000 UT) tablet Take by mouth.     clobetasol cream (TEMOVATE) 0.05 % Apply topically.     Dextran 70-Hypromellose (ARTIFICIAL TEARS) 0.1-0.3 % SOLN Apply to eye.     diclofenac Sodium (VOLTAREN) 1 % GEL Apply topically.     fexofenadine (ALLEGRA) 180 MG tablet Take by mouth.     GLUCOSAMINE-CHONDROITIN DS PO Take 3,000 mg by mouth daily.     lipase/protease/amylase (CREON ) 36000 UNITS CPEP capsule Take 3 capsules with the first bite of each meal and 2 capsule with the first bite of each snack 390 capsule 10   losartan  (COZAAR ) 25 MG tablet TAKE 1 TABLET (25 MG TOTAL) BY MOUTH DAILY. (Patient taking differently: Take 12.5 mg by mouth daily.) 90 tablet 1   Magnesium  250 MG TABS Take 250 mg by mouth daily.     Multiple Vitamin (MULTIVITAMIN WITH MINERALS) TABS tablet Take 1 tablet by mouth daily. One-A-Day Active 65+     nystatin  cream (MYCOSTATIN ) Apply 1 application topically 2 (two) times daily. 30 g 0   Olopatadine  HCl 0.2 % SOLN Apply to eye.     sertraline  (ZOLOFT ) 50 MG tablet TAKE 1 TABLET BY MOUTH EVERY DAY 90 tablet 1   tirzepatide (MOUNJARO) 7.5 MG/0.5ML Pen Inject 7.5 mg into the skin once a week.     triamcinolone  cream (KENALOG ) 0.1 % Apply  1 application topically 2 (two) times daily. 30 g 0   HYDROcodone -acetaminophen  (NORCO/VICODIN) 5-325 MG tablet Take 1 tablet by mouth every 4 (four) hours as needed. (Patient not taking: Reported on 11/25/2023)     No current facility-administered medications for this visit.    Allergies:   Apple, Ivp dye [iodinated contrast media], Tape, and Wound dressing adhesive    Social History:  The patient  reports that she quit smoking about 54 years ago. Her smoking use included cigarettes. She started smoking about 56 years ago. She has a 0.5 pack-year smoking history. She has never used smokeless tobacco. She reports current alcohol use of about 7.0 standard drinks of alcohol per week. She reports that she does not use drugs.  ROS:  Please see the history of present illness.   Otherwise, review of systems are positive for none.   All other systems are reviewed and negative.    PHYSICAL EXAM: VS:  BP (!) 150/90 (BP Location: Left Arm, Patient Position: Sitting, Cuff Size: Large)   Pulse 85   Ht 5' 5 (1.651 m)   Wt 214 lb 12.8 oz (97.4 kg)   SpO2 93%   BMI 35.74 kg/m  , BMI Body mass index is 35.74 kg/m. GEN: Well nourished, well developed, in no acute distress  HEENT: normal  Neck: no JVD, carotid bruits, or masses Cardiac: Irregularly irregular; no rubs, or gallops, .  1/6 systolic murmur in the aortic area.  Mild bilateral leg edema with stasis dermatitis. Respiratory:  clear to auscultation bilaterally, normal work of breathing GI: soft, nontender, nondistended, + BS MS: no deformity or atrophy  Skin: warm and dry, no rash Neuro:  Strength and sensation are intact Psych: euthymic mood, full affect Radial pulses normal bilaterally   EKG:  EKG  ordered today. EKG showed : Atrial fibrillation with a competing junctional pacemaker with premature ventricular or aberrantly conducted complexes Low voltage QRS Cannot rule out Anterior infarct (cited on or before  19-Sep-2020)   Recent Labs: No results found for requested labs within last 365 days.    Lipid Panel    Component Value Date/Time   CHOL 94 09/20/2020 0632   CHOL 186 09/04/2015 1051   TRIG 90 09/20/2020 0632   HDL 43 09/20/2020 0632   HDL 66 09/04/2015 1051   CHOLHDL 2.2 09/20/2020 0632   VLDL 18 09/20/2020 0632   LDLCALC 33 09/20/2020 0632   LDLCALC 99 09/04/2015 1051   LDLDIRECT 57.0 10/31/2020 1054      Wt Readings from Last 3 Encounters:  11/25/23 214 lb 12.8 oz (97.4 kg)  09/16/23 213 lb 9.6 oz (96.9 kg)  04/15/23 244 lb (110.7 kg)        ASSESSMENT AND PLAN:  1.  Chronic atrial fibrillation: Ventricular rate is controlled without any medication.   She is tolerating anticoagulation with Eliquis .  I reviewed her most recent labs done in October which showed normal CBC and normal renal function.  2.  Essential hypertension: Blood pressure is mildly elevated today but she has been having issues with symptomatic hypotension.  Thus, I made no changes.  3.   left MCA stroke: No recurrent symptoms.  Continue medical therapy.  4.  Hyperlipidemia: I reviewed most recent lipid profile done in October which showed an LDL of 35 which is at target.  Continue atorvastatin  40 mg once daily.  5.  I reviewed the results of echocardiogram that was done in April of last year due to shortness of breath.  It showed normal LV systolic function, mild mitral regurgitation and calcified aortic valve without significant stenosis.   Disposition:   FU with me in 12 months.  Signed,  Deatrice Cage, MD  11/25/2023 5:02 PM    [facility]

## 2023-12-26 ENCOUNTER — Telehealth: Payer: Self-pay | Admitting: Cardiovascular Disease

## 2023-12-26 NOTE — Telephone Encounter (Signed)
 Patient dropped off patient prescription record to show her OOP expense for Eliquis. Stated she received a denial letter stating more proof of amount paid OOP for Eliquis. Form placed in Pam's box.

## 2023-12-29 ENCOUNTER — Other Ambulatory Visit (HOSPITAL_COMMUNITY): Payer: Self-pay

## 2023-12-29 ENCOUNTER — Telehealth: Payer: Self-pay

## 2023-12-29 NOTE — Telephone Encounter (Signed)
 RECEIVED, REVIEWING

## 2023-12-29 NOTE — Telephone Encounter (Signed)
 PAP: Application for Eliquis has been submitted to [employer] ([employer]), via fax If patient requests an update in the meantime, please refer them to [employer] at [phone]

## 2024-01-20 NOTE — Telephone Encounter (Signed)
 Called [employer] and checked app status, still pending

## 2024-01-29 NOTE — Telephone Encounter (Signed)
 Received rx spending report on fax. OOP spending has been faxed to [employer].

## 2024-02-03 NOTE — Telephone Encounter (Signed)
 Per [employer] this was denied in march and she probably won't meet criteria until September. But they did receive the fax on 01/29/24 and that is still in process

## 2024-02-24 NOTE — Telephone Encounter (Signed)
 PAP: Patient has been denied for patient assistance by [employer] ([employer]) due to 3% OOP spending has not been met yet for 2025. Letter has been mailed to patient.  Forwarding as FYI for clinic

## 2024-02-27 ENCOUNTER — Other Ambulatory Visit: Payer: Self-pay | Admitting: Internal Medicine

## 2024-02-27 DIAGNOSIS — R8281 Pyuria: Secondary | ICD-10-CM

## 2024-02-27 DIAGNOSIS — R310 Gross hematuria: Secondary | ICD-10-CM

## 2024-03-01 ENCOUNTER — Ambulatory Visit
Admission: RE | Admit: 2024-03-01 | Discharge: 2024-03-01 | Disposition: A | Discharge status: Home / Self Care | Source: Ambulatory Visit | Attending: Internal Medicine | Admitting: Internal Medicine

## 2024-03-01 DIAGNOSIS — R8281 Pyuria: Secondary | ICD-10-CM | POA: Insufficient documentation

## 2024-03-01 DIAGNOSIS — R310 Gross hematuria: Secondary | ICD-10-CM | POA: Insufficient documentation

## 2024-03-30 ENCOUNTER — Telehealth: Payer: Self-pay | Admitting: Pharmacy Technician

## 2024-03-30 NOTE — Telephone Encounter (Signed)
 Sent more oop to [employer]. Made encounter to keep up with it

## 2024-04-01 NOTE — Telephone Encounter (Addendum)
 I called [employer] to check if they got the new oop. [employer] said they got the oop but still in process.    Oop scanned in media

## 2024-04-01 NOTE — Telephone Encounter (Signed)
 Caller Loris Ros) stated she will need to verify the date patient started on Eliquis .

## 2024-04-01 NOTE — Telephone Encounter (Signed)
 PAP: Patient assistance application for Eliquis  has been approved by PAP Companies: [employer] from 04/01/24 to 10/20/24. Medication should be delivered to PAP Delivery: Home. For further shipping updates, please contact [employer] ([employer]) at [phone]. Patient ID is: [email or web address]   I called the patient to make her aware of the approval and had to leave a message

## 2024-04-18 ENCOUNTER — Other Ambulatory Visit: Payer: Self-pay | Admitting: Cardiovascular Disease

## 2024-04-18 DIAGNOSIS — I482 Chronic atrial fibrillation, unspecified: Secondary | ICD-10-CM

## 2024-04-19 NOTE — Telephone Encounter (Signed)
 Prescription refill request for Eliquis  received. Indication:afib Last office visit:2/25 Scr:0.85  5/25 Age: 78 Weight:97.4  kg  Prescription refilled

## 2024-08-16 ENCOUNTER — Other Ambulatory Visit: Payer: Self-pay | Admitting: Medical Genetics

## 2024-08-16 DIAGNOSIS — Z006 Encounter for examination for normal comparison and control in clinical research program: Secondary | ICD-10-CM

## 2024-10-06 DIAGNOSIS — R58 Hemorrhage, not elsewhere classified: Secondary | ICD-10-CM | POA: Insufficient documentation

## 2024-10-06 HISTORY — DX: Hemorrhage, not elsewhere classified: R58

## 2024-10-08 ENCOUNTER — Inpatient Hospital Stay
Admission: EM | Admit: 2024-10-08 | Discharge: 2024-10-12 | DRG: 378 | Disposition: A | Discharge status: Home / Self Care

## 2024-10-08 ENCOUNTER — Other Ambulatory Visit: Payer: Self-pay

## 2024-10-08 ENCOUNTER — Inpatient Hospital Stay: Admitting: Radiology

## 2024-10-08 ENCOUNTER — Inpatient Hospital Stay

## 2024-10-08 DIAGNOSIS — Z96651 Presence of right artificial knee joint: Secondary | ICD-10-CM | POA: Diagnosis present

## 2024-10-08 DIAGNOSIS — K5731 Diverticulosis of large intestine without perforation or abscess with bleeding: Principal | ICD-10-CM | POA: Diagnosis present

## 2024-10-08 DIAGNOSIS — K5730 Diverticulosis of large intestine without perforation or abscess without bleeding: Secondary | ICD-10-CM | POA: Diagnosis not present

## 2024-10-08 DIAGNOSIS — Z7901 Long term (current) use of anticoagulants: Secondary | ICD-10-CM | POA: Diagnosis not present

## 2024-10-08 DIAGNOSIS — F32A Depression, unspecified: Secondary | ICD-10-CM | POA: Diagnosis present

## 2024-10-08 DIAGNOSIS — K922 Gastrointestinal hemorrhage, unspecified: Principal | ICD-10-CM | POA: Diagnosis present

## 2024-10-08 DIAGNOSIS — Z8673 Personal history of transient ischemic attack (TIA), and cerebral infarction without residual deficits: Secondary | ICD-10-CM

## 2024-10-08 DIAGNOSIS — R159 Full incontinence of feces: Secondary | ICD-10-CM | POA: Diagnosis present

## 2024-10-08 DIAGNOSIS — K5791 Diverticulosis of intestine, part unspecified, without perforation or abscess with bleeding: Secondary | ICD-10-CM | POA: Diagnosis not present

## 2024-10-08 DIAGNOSIS — R7303 Prediabetes: Secondary | ICD-10-CM | POA: Diagnosis present

## 2024-10-08 DIAGNOSIS — D72829 Elevated white blood cell count, unspecified: Secondary | ICD-10-CM | POA: Diagnosis present

## 2024-10-08 DIAGNOSIS — Z8249 Family history of ischemic heart disease and other diseases of the circulatory system: Secondary | ICD-10-CM

## 2024-10-08 DIAGNOSIS — Z91041 Radiographic dye allergy status: Secondary | ICD-10-CM | POA: Diagnosis not present

## 2024-10-08 DIAGNOSIS — R32 Unspecified urinary incontinence: Secondary | ICD-10-CM | POA: Diagnosis present

## 2024-10-08 DIAGNOSIS — Z7985 Long-term (current) use of injectable non-insulin antidiabetic drugs: Secondary | ICD-10-CM

## 2024-10-08 DIAGNOSIS — Z87891 Personal history of nicotine dependence: Secondary | ICD-10-CM | POA: Diagnosis not present

## 2024-10-08 DIAGNOSIS — K640 First degree hemorrhoids: Secondary | ICD-10-CM | POA: Diagnosis present

## 2024-10-08 DIAGNOSIS — I10 Essential (primary) hypertension: Secondary | ICD-10-CM | POA: Diagnosis present

## 2024-10-08 DIAGNOSIS — K921 Melena: Secondary | ICD-10-CM | POA: Diagnosis present

## 2024-10-08 DIAGNOSIS — F419 Anxiety disorder, unspecified: Secondary | ICD-10-CM | POA: Diagnosis present

## 2024-10-08 DIAGNOSIS — K8689 Other specified diseases of pancreas: Secondary | ICD-10-CM | POA: Diagnosis present

## 2024-10-08 DIAGNOSIS — F329 Major depressive disorder, single episode, unspecified: Secondary | ICD-10-CM | POA: Diagnosis present

## 2024-10-08 DIAGNOSIS — K635 Polyp of colon: Secondary | ICD-10-CM

## 2024-10-08 DIAGNOSIS — Z79899 Other long term (current) drug therapy: Secondary | ICD-10-CM

## 2024-10-08 DIAGNOSIS — I700 Atherosclerosis of aorta: Secondary | ICD-10-CM | POA: Diagnosis present

## 2024-10-08 DIAGNOSIS — E785 Hyperlipidemia, unspecified: Secondary | ICD-10-CM | POA: Diagnosis present

## 2024-10-08 DIAGNOSIS — Z539 Procedure and treatment not carried out, unspecified reason: Secondary | ICD-10-CM | POA: Diagnosis not present

## 2024-10-08 DIAGNOSIS — Z91048 Other nonmedicinal substance allergy status: Secondary | ICD-10-CM | POA: Diagnosis not present

## 2024-10-08 DIAGNOSIS — Z91018 Allergy to other foods: Secondary | ICD-10-CM

## 2024-10-08 DIAGNOSIS — Z811 Family history of alcohol abuse and dependence: Secondary | ICD-10-CM

## 2024-10-08 DIAGNOSIS — I482 Chronic atrial fibrillation, unspecified: Secondary | ICD-10-CM | POA: Diagnosis present

## 2024-10-08 DIAGNOSIS — D122 Benign neoplasm of ascending colon: Secondary | ICD-10-CM | POA: Diagnosis present

## 2024-10-08 DIAGNOSIS — K219 Gastro-esophageal reflux disease without esophagitis: Secondary | ICD-10-CM | POA: Diagnosis present

## 2024-10-08 DIAGNOSIS — G4733 Obstructive sleep apnea (adult) (pediatric): Secondary | ICD-10-CM | POA: Diagnosis present

## 2024-10-08 HISTORY — PX: IR ANGIOGRAM VISCERAL SELECTIVE: IMG657

## 2024-10-08 LAB — CBC
HCT: 39.2 % (ref 36.0–46.0)
Hemoglobin: 12.7 g/dL (ref 12.0–15.0)
MCH: 30.3 pg (ref 26.0–34.0)
MCHC: 32.4 g/dL (ref 30.0–36.0)
MCV: 93.6 fL (ref 80.0–100.0)
Platelets: 182 K/uL (ref 150–400)
RBC: 4.19 MIL/uL (ref 3.87–5.11)
RDW: 13.7 % (ref 11.5–15.5)
WBC: 7.2 K/uL (ref 4.0–10.5)
nRBC: 0 % (ref 0.0–0.2)

## 2024-10-08 LAB — HEMOGLOBIN AND HEMATOCRIT, BLOOD
HCT: 37.5 % (ref 36.0–46.0)
Hemoglobin: 12.4 g/dL (ref 12.0–15.0)

## 2024-10-08 LAB — TYPE AND SCREEN
ABO/RH(D): A POS
Antibody Screen: NEGATIVE

## 2024-10-08 LAB — COMPREHENSIVE METABOLIC PANEL WITH GFR
ALT: 14 U/L (ref 0–44)
AST: 18 U/L (ref 15–41)
Albumin: 4.1 g/dL (ref 3.5–5.0)
Alkaline Phosphatase: 53 U/L (ref 38–126)
Anion gap: 8 (ref 5–15)
BUN: 27 mg/dL — ABNORMAL HIGH (ref 8–23)
CO2: 26 mmol/L (ref 22–32)
Calcium: 9 mg/dL (ref 8.9–10.3)
Chloride: 105 mmol/L (ref 98–111)
Creatinine, Ser: 0.76 mg/dL (ref 0.44–1.00)
GFR, Estimated: >60 mL/min
Glucose, Bld: 95 mg/dL (ref 70–99)
Potassium: 4.8 mmol/L (ref 3.5–5.1)
Sodium: 139 mmol/L (ref 135–145)
Total Bilirubin: 0.4 mg/dL (ref 0.0–1.2)
Total Protein: 6.5 g/dL (ref 6.5–8.1)

## 2024-10-08 LAB — MAGNESIUM: Magnesium: 2 mg/dL (ref 1.7–2.4)

## 2024-10-08 MED ORDER — IOHEXOL 300 MG/ML  SOLN
150.0000 mL | Freq: Once | INTRAMUSCULAR | Status: AC | PRN
  Administered 2024-10-08: 150 mL via INTRA_ARTERIAL

## 2024-10-08 MED ORDER — ONDANSETRON HCL 4 MG/2ML IJ SOLN
4.0000 mg | Freq: Four times a day (QID) | INTRAMUSCULAR | Status: DC | PRN
  Administered 2024-10-10: 4 mg via INTRAVENOUS
  Filled 2024-10-08: qty 2

## 2024-10-08 MED ORDER — MIDAZOLAM HCL 2 MG/2ML IJ SOLN
INTRAMUSCULAR | Status: AC
  Filled 2024-10-08: qty 4

## 2024-10-08 MED ORDER — ACETAMINOPHEN 325 MG PO TABS: 650.0000 mg | ORAL_TABLET | Freq: Four times a day (QID) | ORAL | Status: DC | PRN

## 2024-10-08 MED ORDER — ACETAMINOPHEN 650 MG RE SUPP: 650.0000 mg | Freq: Four times a day (QID) | RECTAL | Status: DC | PRN

## 2024-10-08 MED ORDER — MIDAZOLAM HCL 5 MG/5ML IJ SOLN
INTRAMUSCULAR | Status: AC | PRN
  Administered 2024-10-08: 1 mg via INTRAVENOUS

## 2024-10-08 MED ORDER — LIDOCAINE HCL 1 % IJ SOLN
8.0000 mL | Freq: Once | INTRAMUSCULAR | Status: AC
  Administered 2024-10-08: 8 mL via INTRADERMAL

## 2024-10-08 MED ORDER — LIDOCAINE HCL 1 % IJ SOLN
INTRAMUSCULAR | Status: AC
  Filled 2024-10-08: qty 20

## 2024-10-08 MED ORDER — METHYLPREDNISOLONE SODIUM SUCC 40 MG IJ SOLR
40.0000 mg | Freq: Once | INTRAMUSCULAR | Status: AC
  Administered 2024-10-08: 40 mg via INTRAVENOUS
  Filled 2024-10-08: qty 1

## 2024-10-08 MED ORDER — PANTOPRAZOLE SODIUM 40 MG IV SOLR
40.0000 mg | Freq: Two times a day (BID) | INTRAVENOUS | Status: DC
  Administered 2024-10-08 – 2024-10-11 (×8): 40 mg via INTRAVENOUS
  Filled 2024-10-08 (×9): qty 10

## 2024-10-08 MED ORDER — DIPHENHYDRAMINE HCL 50 MG/ML IJ SOLN
50.0000 mg | Freq: Once | INTRAMUSCULAR | Status: AC
  Administered 2024-10-08: 50 mg via INTRAVENOUS
  Filled 2024-10-08: qty 1

## 2024-10-08 MED ORDER — ONDANSETRON HCL 4 MG PO TABS: 4.0000 mg | ORAL_TABLET | Freq: Four times a day (QID) | ORAL | Status: DC | PRN

## 2024-10-08 MED ORDER — METHYLPREDNISOLONE SODIUM SUCC 40 MG IJ SOLR
40.0000 mg | Freq: Once | INTRAMUSCULAR | Status: AC
  Administered 2024-10-08: 40 mg via INTRAVENOUS

## 2024-10-08 MED ORDER — FENTANYL CITRATE (PF) 100 MCG/2ML IJ SOLN
INTRAMUSCULAR | Status: AC | PRN
  Administered 2024-10-08: 50 ug via INTRAVENOUS

## 2024-10-08 MED ORDER — PANTOPRAZOLE SODIUM 40 MG IV SOLR: 80.0000 mg | Freq: Once | INTRAVENOUS | Status: DC

## 2024-10-08 MED ORDER — FENTANYL CITRATE (PF) 100 MCG/2ML IJ SOLN
INTRAMUSCULAR | Status: AC
  Filled 2024-10-08: qty 2

## 2024-10-08 MED ORDER — SODIUM CHLORIDE 0.9% FLUSH
3.0000 mL | Freq: Two times a day (BID) | INTRAVENOUS | Status: DC
  Administered 2024-10-08 – 2024-10-11 (×7): 3 mL via INTRAVENOUS

## 2024-10-08 MED ORDER — DIPHENHYDRAMINE HCL 25 MG PO CAPS: 50.0000 mg | ORAL_CAPSULE | Freq: Once | ORAL | Status: AC

## 2024-10-08 MED ORDER — METHYLPREDNISOLONE SODIUM SUCC 125 MG IJ SOLR
INTRAMUSCULAR | Status: AC
  Filled 2024-10-08: qty 2

## 2024-10-08 MED ORDER — IOHEXOL 350 MG/ML SOLN
100.0000 mL | Freq: Once | INTRAVENOUS | Status: AC | PRN
  Administered 2024-10-08: 100 mL via INTRAVENOUS

## 2024-10-08 NOTE — Consult Note (Signed)
 "  Ruel Kung , MD [address], [address], [place], [place], [address] Phone: [phone] Fax: [phone]  Consultation  Referring Provider:   Emergency room Primary Care Physician:  Sampson Ethridge LABOR, MD Primary Gastroenterologist:  Dr. Unk          Reason for Consultation:     Rectal bleeding  Date of Admission:  10/08/2024 Date of Consultation:  10/08/2024         HPI:   Crystal Haas is a 78 y.o. female who used to follow with Dr. Unk at [facility] gastroenterology.  Has a history of chronic pancreatic insufficiency.  She has had a colonoscopy in 2020 that showed sigmoid diverticulosis a flexible sigmoidoscopy in 2023 that was negative for microscopic colitis but showed diverticulosis sigmoid colon yet again.  She presents to the emergency room after she had multiple episodes of painless bright red blood per rectum starting at 730 this a.m. probably has had 5 episodes so far but each time she has the bleed the quantity of blood she has expelled has been decreasing.  Denies any chest discomfort denies any nausea denies any vomiting denies any hematemesis denies any chest pains or any other complaints denies any NSAID use.  Last dose of Eliquis  taken for atrial fibrillation this morning.  She was appearing very comfortable when I went into the emergency room to see her.  Past Medical History:  Diagnosis Date   Anemia    distant past   Anxiety    Arthritis    everywhere - big toes worst   Chronic atrial fibrillation (HCC)    a. on eliquis ; b. 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   Seasonal allergies    takes allergy weekly   Status post bilateral breast reduction 09/06/2016   Overview:  08/29/16   Stroke [facility])     Past Surgical  History:  Procedure Laterality Date   BREAST REDUCTION SURGERY Bilateral 08/29/2016   Procedure: BILATERAL MAMMARY REDUCTION  (BREAST)WITH LIPOSUCTION;  Surgeon: Estefana GORMAN Fritter, DO;  Location: [facility];  Service: Plastics;  Laterality: Bilateral;   CATARACT EXTRACTION W/PHACO Left 05/03/2015   Procedure: CATARACT EXTRACTION PHACO AND INTRAOCULAR LENS PLACEMENT (IOC);  Surgeon: Dene Etienne, MD;  Location: [facility];  Service: Ophthalmology;  Laterality: Left;  CPAP   CATARACT EXTRACTION W/PHACO Right 10/09/2016   Procedure: CATARACT EXTRACTION PHACO AND INTRAOCULAR LENS PLACEMENT (IOC);  Surgeon: Dene Etienne, MD;  Location: [facility];  Service: Ophthalmology;  Laterality: Right;  sleep apnea   CHONDROPLASTY Right 04/29/2016   Procedure: CHONDROPLASTY;  Surgeon: Lynwood SHAUNNA Hue, MD;  Location: [facility];  Service: Orthopedics;  Laterality: Right;   COLONOSCOPY WITH PROPOFOL  N/A 10/24/2018   Procedure: COLONOSCOPY WITH PROPOFOL ;  Surgeon: Janalyn Keene NOVAK, MD;  Location: [facility];  Service: Endoscopy;  Laterality: N/A;   EYE SURGERY     FLEXIBLE SIGMOIDOSCOPY N/A 05/30/2022   Procedure: FLEXIBLE SIGMOIDOSCOPY;  Surgeon: Unk Corinn Skiff, MD;  Location: [facility];  Service: Gastroenterology;  Laterality: N/A;   KNEE ARTHROPLASTY Right 03/25/2018   Procedure: COMPUTER ASSISTED TOTAL KNEE ARTHROPLASTY;  Surgeon: Hue Lynwood SHAUNNA,  MD;  Location: [facility];  Service: Orthopedics;  Laterality: Right;   KNEE ARTHROSCOPY WITH LATERAL MENISECTOMY  04/29/2016   Procedure: KNEE ARTHROSCOPY WITH LATERAL MENISECTOMY;  Surgeon: Lynwood SHAUNNA Hue, MD;  Location: [facility];  Service: Orthopedics;;   KNEE ARTHROSCOPY WITH MEDIAL MENISECTOMY  04/29/2016   Procedure: KNEE ARTHROSCOPY WITH MEDIAL MENISECTOMY;  Surgeon: Lynwood SHAUNNA Hue, MD;  Location: [facility];  Service: Orthopedics;;   REDUCTION MAMMAPLASTY Bilateral 09/2016   RETINAL DETACHMENT SURGERY Left  May 03, 2015   Dr. Mittie, [facility]   TUBAL LIGATION      Prior to Admission medications  Medication Sig Start Date End Date Taking? Authorizing Provider  Azelastine HCl 137 MCG/SPRAY SOLN Place 1 spray into both nostrils daily. 08/17/23  Yes [provider]  B Complex-C (B-COMPLEX WITH VITAMIN C) tablet Take 1 tablet by mouth daily.   Yes [provider]  cholecalciferol  25 MCG (1000 UT) tablet Take by mouth. 07/05/16  Yes [provider]  clobetasol cream (TEMOVATE) 0.05 % Apply topically. 07/08/23  Yes [provider]  Dextran 70-Hypromellose (ARTIFICIAL TEARS) 0.1-0.3 % SOLN Place 1 drop into both eyes daily as needed. 04/13/16  Yes [provider]  ELIQUIS  5 MG TABS tablet TAKE 1 TABLET BY MOUTH TWICE A DAY 04/19/24  Yes Darron Deatrice LABOR, MD  fexofenadine (ALLEGRA) 180 MG tablet Take by mouth. 07/05/16  Yes [provider]  GLUCOSAMINE-CHONDROITIN DS PO Take 3,000 mg by mouth daily.   Yes [provider]  lipase/protease/amylase (CREON ) 36000 UNITS CPEP capsule Take 3 capsules with the first bite of each meal and 2 capsule with the first bite of each snack 12/03/22  Yes Vanga, Corinn Skiff, MD  losartan  (COZAAR ) 25 MG tablet TAKE 1 TABLET (25 MG TOTAL) BY MOUTH DAILY. Patient taking differently: Take 12.5 mg by mouth daily. 10/16/22  Yes Marylynn Verneita CROME, MD  Magnesium  250 MG TABS Take 250 mg by mouth daily.   Yes [provider]  Multiple Vitamin (MULTIVITAMIN WITH MINERALS) TABS tablet Take 1 tablet by mouth daily. One-A-Day Active 65+   Yes [provider]  Olopatadine  HCl 0.2 % SOLN Place 1 drop into both eyes daily. 03/14/16  Yes [provider]  sertraline  (ZOLOFT ) 50 MG tablet TAKE 1 TABLET BY MOUTH EVERY DAY 04/19/22  Yes Marylynn Verneita CROME, MD  tirzepatide [facility]) 7.5 MG/0.5ML Pen Inject 7.5 mg into the skin once a week.   Yes [provider]  atorvastatin  (LIPITOR) 40 MG tablet Take 40 mg  by mouth daily. Patient not taking: Reported on 10/08/2024    [provider]  baclofen (LIORESAL) 10 MG tablet Take 10 mg by mouth daily. Patient not taking: Reported on 10/08/2024 05/06/23   [provider]  diclofenac Sodium (VOLTAREN) 1 % GEL Apply topically. Patient not taking: Reported on 10/08/2024 08/05/23   [provider]  HYDROcodone -acetaminophen  (NORCO/VICODIN) 5-325 MG tablet Take 1 tablet by mouth every 4 (four) hours as needed. Patient not taking: Reported on 11/25/2023 08/15/23   [provider]  nystatin  cream (MYCOSTATIN ) Apply 1 application topically 2 (two) times daily. 07/17/21   Maribeth Camellia MATSU, MD  triamcinolone  cream (KENALOG ) 0.1 % Apply 1 application topically 2 (two) times daily. Patient not taking: Reported on 10/08/2024 12/07/21   Marylynn Verneita CROME, MD    Family History  Problem Relation Age of Onset   Alcoholism Other    Heart disease Other    Breast cancer Neg Hx  Social History[1]  Allergies as of 10/08/2024 - Review Complete 10/08/2024  Allergen Reaction Noted   Apple Anaphylaxis 05/03/2015   Ivp dye [iodinated contrast media] Shortness Of Breath 04/28/2015   Tape Other (See Comments) 04/28/2015   Wound dressing adhesive Itching 04/28/2015    Review of Systems:    All systems reviewed and negative except where noted in HPI.   Physical Exam:  Vital signs in last 24 hours: Temp:  [97.9 F (36.6 C)] 97.9 F (36.6 C) (12/19 1025) Pulse Rate:  [83-87] 87 (12/19 1103) Resp:  [18] 18 (12/19 1103) BP: (136-148)/(73-75) 136/75 (12/19 1103) SpO2:  [97 %-100 %] 100 % (12/19 1131) Weight:  [97.4 kg] 97.4 kg (12/19 1025)   General:   Pleasant, cooperative in NAD Head:  Normocephalic and atraumatic. Eyes:   No icterus.   Conjunctiva pink. PERRLA. Ears:  Normal auditory acuity. Neck:  Supple; no masses or thyroidomegaly Lungs: Respirations even and unlabored. Lungs clear to auscultation bilaterally.   No wheezes,  crackles, or rhonchi.  Heart:  Regular rate and rhythm;  Without murmur, clicks, rubs or gallops Abdomen:  Soft, nondistended, nontender. Normal bowel sounds. No appreciable masses or hepatomegaly.  No rebound or guarding.  Neurologic:  Alert and oriented x3;  grossly normal neurologically. Skin:  Intact without significant lesions or rashes. Cervical Nodes:  No significant cervical adenopathy. Psych:  Alert and cooperative. Normal affect.  LAB RESULTS: Recent Labs    10/08/24 1030  WBC 7.2  HGB 12.7  HCT 39.2  PLT 182   BMET Recent Labs    10/08/24 1030  NA 139  K 4.8  CL 105  CO2 26  GLUCOSE 95  BUN 27*  CREATININE 0.76  CALCIUM  9.0   LFT Recent Labs    10/08/24 1030  PROT 6.5  ALBUMIN 4.1  AST 18  ALT 14  ALKPHOS 53  BILITOT 0.4   PT/INR No results for input(s): LABPROT, INR in the last 72 hours.  STUDIES: No results found.    Impression / Plan:   Crystal Haas is a 78 y.o. y/o female with a prior history of sigmoid diverticulosis on colonoscopy back in 2020 on Eliquis  for atrial fibrillation presents to the emergency room with multiple episodes of painless rectal bleeding starting at 730 this morning.  Last dose of Eliquis  taken was this morning each time she has had a bowel movement the quantity of blood expelled has been decreasing.  I explained to her this is very likely a diverticular bleed and usually would expected to resolve on its own.  Recommendations Monitor CBC and transfuse as needed I believe she has a severe allergy to IV contrast I did discuss with the emergency room physician maybe it is worth pretreat with the regimen for contrast allergy just in case if she were to need an urgent CT angiogram if having a life-threatening GI bleed as the pretreatment should be started 24 hours prior to CT angiogram.  However I do not recommend to do it unless her bleeding is significant and the benefits of CT angiogram outweigh the risks If she has no  further bleeding or it weans off then I would recommend a colonoscopy on Monday when she has been off Eliquis  for 2 doses i.e. Saturday and Sunday I have discussed the risks and benefits of a colonoscopy and the rationale for the same I will continue to see her over the weekend     Thank you for involving me in the care of  this patient.      LOS: 0 days   Ruel Kung, MD  10/08/2024, 1:06 PM        [1]  Social History Tobacco Use   Smoking status: Former    Current packs/day: 0.00    Average packs/day: 0.3 packs/day for 2.0 years (0.5 ttl pk-yrs)    Types: Cigarettes    Start date: 10/22/1967    Quit date: 10/21/1969    Years since quitting: 55.0   Smokeless tobacco: Never  Vaping Use   Vaping status: Never Used  Substance Use Topics   Alcohol use: Yes    Alcohol/week: 7.0 standard drinks of alcohol    Types: 7 Glasses of wine per week    Comment: wine with dinner   Drug use: No   "

## 2024-10-08 NOTE — ED Notes (Signed)
 Pt states that she has had bright red rectal bleeding that started this morning around 7:30am. Pt reports filling 2 toilet bowls with 1 large and 1 small clot. Pt denies any pain. Pt reports taking Eliquis . Pt states she had the flu and pneumonia shot yesterday morning in the left arm and stated that it swelled up the size of a baseball last night. This RN notices erythema and swelling noted at injection site. Pt A&Ox4.

## 2024-10-08 NOTE — Procedures (Signed)
" °  Procedure:  Inferior mesenteric arteriogram negative, no active bleed or focal lesion Preprocedure diagnosis: The primary encounter diagnosis was Gastrointestinal hemorrhage, unspecified gastrointestinal hemorrhage type. A diagnosis of Lower GI bleed was also pertinent to this visit. Postprocedure diagnosis: same EBL:    minimal Complications:   none immediate  See full dictation in [employer].  CHARM Toribio Faes MD Main # [phone] Pager  [phone] Mobile [phone]    "

## 2024-10-08 NOTE — ED Provider Notes (Signed)
 "  [facility] Provider Note    Event Date/Time   First MD Initiated Contact with Patient 10/08/24 1122     (approximate)   History   Rectal Bleeding  Patient c/o rectal bleeding, reportedly filled toilet bowel twice. Denies abdominal pain. Reports has happened once in past, had colonoscopy, no reason found.    HPI Crystal Haas is a 78 y.o. female PMH A-fib on Eliquis , prior lower GI bleed presents for evaluation of rectal bleeding - Started around 7:30 AM this morning.  Is on Eliquis , did take her usual Eliquis  dose this morning. - Abdominal pain, no lightheadedness -No bleeding from other sources -Has not had any p.o. intake today     Physical Exam   Triage Vital Signs: ED Triage Vitals [10/08/24 1025]  Encounter Vitals Group     BP (!) 148/73     Girls Systolic BP Percentile      Girls Diastolic BP Percentile      Boys Systolic BP Percentile      Boys Diastolic BP Percentile      Pulse Rate 83     Resp 18     Temp 97.9 F (36.6 C)     Temp Source Oral     SpO2 97 %     Weight 214 lb 11.7 oz (97.4 kg)     Height 5' 5 (1.651 m)     Head Circumference      Peak Flow      Pain Score 0     Pain Loc      Pain Education      Exclude from Growth Chart     Most recent vital signs: Vitals:   10/08/24 1103 10/08/24 1131  BP: 136/75   Pulse: 87   Resp: 18   Temp:    SpO2: 99% 100%     General: Awake, no distress.  CV:  Good peripheral perfusion. RRR, RP 2+ Resp:  Normal effort. CTAB Abd:  No distention. Nontender to deep palpation throughout Rectal:  Chaperoned rectal exam with dark blood/maroon stool with shallow pooling in hospital briefs. DRE w/ no masses.   ED Results / Procedures / Treatments   Labs (all labs ordered are listed, but only abnormal results are displayed) Labs Reviewed  COMPREHENSIVE METABOLIC PANEL WITH GFR - Abnormal; Notable for the following components:      Result Value   BUN 27 (*)    All other  components within normal limits  CBC  POC OCCULT BLOOD, ED  TYPE AND SCREEN     EKG  Ecg = A-fib, rate 77, no gross ST elevation or depression, no significant repolarization abnormality, normal axis, normal intervals.  No clear evidence of ischemia nor arrhythmia my interpretation.   RADIOLOGY CTA GIB protocol pending  PROCEDURES:  Critical Care performed: No  Procedures   MEDICATIONS ORDERED IN ED: Medications  pantoprazole  (PROTONIX ) injection 80 mg (has no administration in time range)  methylPREDNISolone  sodium succinate (SOLU-MEDROL ) 40 mg/mL injection 40 mg (has no administration in time range)  diphenhydrAMINE  (BENADRYL ) capsule 50 mg (has no administration in time range)    Or  diphenhydrAMINE  (BENADRYL ) injection 50 mg (has no administration in time range)     IMPRESSION / MDM / ASSESSMENT AND PLAN / ED COURSE  I reviewed the triage vital signs and the nursing notes.  DDX/MDM/AP: Differential diagnosis includes, but is not limited to, apparent lower GI bleed given color and stability.  Given quantity of blood I do think patient should be admitted into the hospital for monitoring and GI evaluation, will discuss with GI.  Considering CTA though does have a history of apparently moderate to severe contrast allergy-Will discuss with radiology and GI.  Will keep n.p.o. in the meantime.  Given stability at this time we will defer immediate anticoagulation reversal.  Plan: - Labs - N.p.o. - Consider CTA GI bleed protocol - GI consult - Anticipate admission  Patient's presentation is most consistent with acute presentation with potential threat to life or bodily function.  The patient is on the cardiac monitor to evaluate for evidence of arrhythmia and/or significant heart rate changes.  ED course below.  Hemoglobin stable, remains hemodynamically stable here in emergency department.  In discussion with GI, does recommend delayed CTA,  radiology in agreement, treating with methylprednisolone  and Benadryl  and will plan for CTA about 4 hours after steroids--CT tech aware.  Does have isolated elevated BUN here, giving Protonix .  Admitted to hospitalist service.  Clinical Course as of 10/08/24 1221  Fri Oct 08, 2024  1145 Paging GI [MM]  1148 D/w Dr. Therisa Recommends CTA with delayed protocol given history of severe allergy Agrees with admission Cannot do colonoscopy until patient has had bowel prep Recommend trend CBC in the meantime Given hemoglobin stable and hemodynamically stable agrees with deferring anticoagulation at this time  [MM]  1153 BUN elevated in isolation, giving Protonix  [MM]  1153 Hospitalist consult order placed [MM]  1159 Based on clinical history, on-call radiologist (?Dr. Derrill) does recommend proceeding with CTA  [MM]    Clinical Course User Index [MM] Clarine Ozell LABOR, MD     FINAL CLINICAL IMPRESSION(S) / ED DIAGNOSES   Final diagnoses:  Gastrointestinal hemorrhage, unspecified gastrointestinal hemorrhage type     Rx / DC Orders   ED Discharge Orders     None        Note:  This document was prepared using Dragon voice recognition software and may include unintentional dictation errors.   Clarine Ozell LABOR, MD 10/08/24 1221  "

## 2024-10-08 NOTE — ED Triage Notes (Signed)
 Patient c/o rectal bleeding, reportedly filled toilet bowel twice. Denies abdominal pain. Reports has happened once in past, had colonoscopy, no reason found.

## 2024-10-08 NOTE — H&P (Signed)
 " History and Physical    [name] DOB: [birth year] DOA: 10/08/2024  DOS: the patient was seen and examined on 10/08/2024  PCP: Entzminger, Ethridge LABOR, MD   Patient coming from: Home  I have personally briefly reviewed patient's old medical records in [facility] and CareEverywhere  HPI:   Crystal Haas is a 78 y.o. year old female with medical history of hypertension, hyperlipidemia, prediabetes, chronic atrial fibrillation on Eliquis , history of stroke, severe diverticulosis with history of GI bleeding presenting to the ED with rectal bleeding.  Patient reports she had bleeding starting this morning.  She reports bright red blood per rectum with some clots.  She denies any dizziness, lightheadedness.  She denies any hematemesis.  On ROS, she denies any abdominal pain. Total 10 episodes so far. States it is slowing down.   On arrival to the ED patient was noted to be HDS stable.  Lab work and imaging obtained.  CBC unremarkable.  There is a downtrend in the hemoglobin from baseline of 13-14 to 12.7.  CMP is unremarkable overall but there is slight BUN elevation.  Given patient report of rectal bleeding this morning, Dr. Michele spoke with GI who recommended getting CTA and admitting for further workup.  They stated no colonoscopy could be performed until after 2 days since patient has had Eliquis  this morning.  Given this, [facility] contacted for admission.  Review of Systems: As mentioned in the history of present illness. All other systems reviewed and are negative.   Past Medical History:  Diagnosis Date   Anemia    distant past   Anxiety    Arthritis    everywhere - big toes worst   Chronic atrial fibrillation (HCC)    a. on eliquis ; b. 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   Seasonal allergies    takes allergy weekly   Status post bilateral breast reduction 09/06/2016   Overview:  08/29/16   Stroke [facility])     Past Surgical History:  Procedure Laterality Date   BREAST REDUCTION SURGERY Bilateral 08/29/2016   Procedure: BILATERAL MAMMARY REDUCTION  (BREAST)WITH LIPOSUCTION;  Surgeon: Estefana GORMAN Fritter, DO;  Location: [facility];  Service: Plastics;  Laterality: Bilateral;   CATARACT EXTRACTION W/PHACO Left 05/03/2015   Procedure: CATARACT EXTRACTION PHACO AND INTRAOCULAR LENS PLACEMENT (IOC);  Surgeon: Dene Etienne, MD;  Location: [facility];  Service: Ophthalmology;  Laterality: Left;  CPAP   CATARACT EXTRACTION W/PHACO Right 10/09/2016   Procedure: CATARACT EXTRACTION PHACO AND INTRAOCULAR LENS PLACEMENT (IOC);  Surgeon: Dene Etienne, MD;  Location: [facility];  Service: Ophthalmology;  Laterality: Right;  sleep apnea   CHONDROPLASTY Right 04/29/2016   Procedure: CHONDROPLASTY;  Surgeon: Lynwood SHAUNNA Hue, MD;  Location: [facility];  Service: Orthopedics;  Laterality: Right;   COLONOSCOPY WITH PROPOFOL  N/A 10/24/2018   Procedure: COLONOSCOPY WITH PROPOFOL ;  Surgeon: Janalyn Keene NOVAK, MD;  Location: [facility];  Service: Endoscopy;  Laterality: N/A;   EYE SURGERY     FLEXIBLE SIGMOIDOSCOPY N/A 05/30/2022   Procedure: FLEXIBLE SIGMOIDOSCOPY;  Surgeon: Unk Corinn Skiff, MD;  Location: [facility];  Service: Gastroenterology;  Laterality: N/A;   KNEE ARTHROPLASTY Right 03/25/2018   Procedure: COMPUTER ASSISTED TOTAL KNEE ARTHROPLASTY;  Surgeon: Mardee Lynwood SQUIBB, MD;  Location: [facility];  Service: Orthopedics;  Laterality: Right;   KNEE ARTHROSCOPY WITH LATERAL MENISECTOMY  04/29/2016   Procedure: KNEE ARTHROSCOPY WITH LATERAL MENISECTOMY;  Surgeon: Lynwood SQUIBB Mardee, MD;  Location: [facility];  Service: Orthopedics;;   KNEE ARTHROSCOPY WITH MEDIAL MENISECTOMY  04/29/2016   Procedure:  KNEE ARTHROSCOPY WITH MEDIAL MENISECTOMY;  Surgeon: Lynwood SQUIBB Mardee, MD;  Location: [facility];  Service: Orthopedics;;   REDUCTION MAMMAPLASTY Bilateral 09/2016   RETINAL DETACHMENT SURGERY Left May 03, 2015   Dr. Mittie, [facility]   TUBAL LIGATION       Allergies[1]  Family History  Problem Relation Age of Onset   Alcoholism Other    Heart disease Other    Breast cancer Neg Hx     Prior to Admission medications  Medication Sig Start Date End Date Taking? Authorizing Provider  atorvastatin  (LIPITOR) 40 MG tablet Take 40 mg by mouth daily.    [provider]  Azelastine  HCl 137 MCG/SPRAY SOLN Place into both nostrils. 08/17/23   [provider]  B Complex-C (B-COMPLEX WITH VITAMIN C) tablet Take 1 tablet by mouth daily.    [provider]  baclofen (LIORESAL) 10 MG tablet Take 10 mg by mouth daily. 05/06/23   [provider]  cholecalciferol  25 MCG (1000 UT) tablet Take by mouth. 07/05/16   [provider]  clobetasol cream (TEMOVATE) 0.05 % Apply topically. 07/08/23   [provider]  Dextran 70-Hypromellose (ARTIFICIAL TEARS) 0.1-0.3 % SOLN Apply to eye. 04/13/16   [provider]  diclofenac Sodium (VOLTAREN) 1 % GEL Apply topically. 08/05/23   [provider]  ELIQUIS  5 MG TABS tablet TAKE 1 TABLET BY MOUTH TWICE A DAY 04/19/24   Darron Deatrice LABOR, MD  fexofenadine (ALLEGRA) 180 MG tablet Take by mouth. 07/05/16   [provider]  GLUCOSAMINE-CHONDROITIN DS PO Take 3,000 mg by mouth daily.    [provider]  HYDROcodone -acetaminophen  (NORCO/VICODIN) 5-325 MG tablet Take 1 tablet by mouth every 4 (four) hours as needed. Patient not taking: Reported on 11/25/2023 08/15/23   [provider]  lipase/protease/amylase (CREON ) 36000 UNITS CPEP capsule Take 3 capsules with the first bite of each meal and 2 capsule with the first bite of each snack 12/03/22   Unk Corinn Skiff, MD  losartan  (COZAAR )  25 MG tablet TAKE 1 TABLET (25 MG TOTAL) BY MOUTH DAILY. Patient taking differently: Take 12.5 mg by mouth daily. 10/16/22   Marylynn Verneita CROME, MD  Magnesium  250 MG TABS Take 250 mg by mouth daily.    [provider]  Multiple Vitamin (MULTIVITAMIN WITH MINERALS) TABS tablet Take 1 tablet by mouth daily. One-A-Day Active 65+    [provider]  nystatin  cream (MYCOSTATIN ) Apply 1 application topically 2 (two) times daily. 07/17/21   Maribeth Camellia MATSU, MD  Olopatadine  HCl 0.2 % SOLN Apply to eye. 03/14/16   [provider]  sertraline  (ZOLOFT ) 50 MG tablet TAKE 1 TABLET BY MOUTH EVERY DAY 04/19/22   Marylynn Verneita CROME, MD  tirzepatide [facility]) 7.5 MG/0.5ML Pen Inject 7.5 mg into the skin once a week.    [provider]  triamcinolone  cream (KENALOG ) 0.1 % Apply 1 application topically 2 (two) times daily. 12/07/21   Marylynn Verneita CROME, MD    Social History:  reports that she quit smoking about 55 years ago.  Her smoking use included cigarettes. She started smoking about 57 years ago. She has a 0.5 pack-year smoking history. She has never used smokeless tobacco. She reports current alcohol use of about 7.0 standard drinks of alcohol per week. She reports that she does not use drugs.    Physical Exam: Vitals:   10/08/24 1025 10/08/24 1103 10/08/24 1131  BP: (!) 148/73 136/75   Pulse: 83 87   Resp: 18 18   Temp: 97.9 F (36.6 C)    TempSrc: Oral    SpO2: 97% 99% 100%  Weight: 97.4 kg    Height: 5' 5 (1.651 m)      Gen: NAD HENT: NCAT CV: normal heart sounds Lung: CTAB Abd: No TTP, normal bowel sounds MSK: No asymmetry, good bulk and tone Neuro: alert and oriented   Labs on Admission: I have personally reviewed following labs and imaging studies  CBC: Recent Labs  Lab 10/08/24 1030  WBC 7.2  HGB 12.7  HCT 39.2  MCV 93.6  PLT 182   Basic Metabolic Panel: Recent Labs  Lab 10/08/24 1030  NA 139  K 4.8  CL 105  CO2 26  GLUCOSE 95  BUN 27*   CREATININE 0.76  CALCIUM  9.0   GFR: Estimated Creatinine Clearance: 68.1 mL/min (by C-G formula based on SCr of 0.76 mg/dL). Liver Function Tests: Recent Labs  Lab 10/08/24 1030  AST 18  ALT 14  ALKPHOS 53  BILITOT 0.4  PROT 6.5  ALBUMIN 4.1   No results for input(s): LIPASE, AMYLASE in the last 168 hours. No results for input(s): AMMONIA in the last 168 hours. Coagulation Profile: No results for input(s): INR, PROTIME in the last 168 hours. Cardiac Enzymes: No results for input(s): CKTOTAL, CKMB, CKMBINDEX, TROPONINI, TROPONINIHS in the last 168 hours. BNP (last 3 results) No results for input(s): BNP in the last 8760 hours. HbA1C: No results for input(s): HGBA1C in the last 72 hours. CBG: No results for input(s): GLUCAP in the last 168 hours. Lipid Profile: No results for input(s): CHOL, HDL, LDLCALC, TRIG, CHOLHDL, LDLDIRECT in the last 72 hours. Thyroid  Function Tests: No results for input(s): TSH, T4TOTAL, FREET4, T3FREE, THYROIDAB in the last 72 hours. Anemia Panel: No results for input(s): VITAMINB12, FOLATE, FERRITIN, TIBC, IRON, RETICCTPCT in the last 72 hours. Urine analysis:    Component Value Date/Time   COLORURINE YELLOW 03/28/2022 1028   APPEARANCEUR CLOUDY (A) 03/28/2022 1028   LABSPEC 1.007 03/28/2022 1028   PHURINE 7.5 03/28/2022 1028   GLUCOSEU NEGATIVE 03/28/2022 1028   GLUCOSEU NEGATIVE 07/27/2019 1621   HGBUR NEGATIVE 03/28/2022 1028   BILIRUBINUR NEGATIVE 07/27/2019 1621   BILIRUBINUR neg 04/20/2018 1219   KETONESUR NEGATIVE 03/28/2022 1028   PROTEINUR NEGATIVE 03/28/2022 1028   UROBILINOGEN 0.2 07/27/2019 1621   NITRITE NEGATIVE 03/28/2022 1028   LEUKOCYTESUR NEGATIVE 03/28/2022 1028    Radiological Exams on Admission: I have personally reviewed images No results found.  EKG: My personal interpretation of EKG shows: Atrial fibrillation with heart rate in 70s.  No acute ST  changes.    Assessment/Plan Principal Problem:   GI bleeding Active Problems:   Chronic atrial fibrillation (HCC)   Hypertension   Diverticulosis of large intestine without diverticulitis   Prediabetes   HLD (hyperlipidemia)   History of stroke   Patient with severe diverticulosis presenting with bright red blood per rectum starting this morning.  She states this happened previously but has not happened in quite some time.  Leading diagnosis is diverticular  bleed.  GI has been consulted.  Appreciate their recommendations.  CTA is pending but given patient's report of previous allergic reaction she will be getting pretreatment.  Given that she has BUN elevation will give IV PPI twice daily.  Will monitor hemoglobin every 8 hours.  Will monitor her hemodynamic stability and if there is any change we will repeat H&H stat. Transfuse if Hgb <8.  Holding anticoagulation and placing patient on SCDs for DVT prophylaxis.  Chronic atrial fibrillation: Patient is in A-fib.  Rate is controlled.  Holding Eliquis  currently given GI bleed as risk significantly outweigh benefits.  Hypertension: Home medicine is losartan  25 mg.  Will hold this currently given concern for GI bleeding.  Will monitor her blood pressure.  Hyperlipidemia/history of CVA: Patient was previously on Lipitor 80 mg.  She is not taking statin currently.  Will repeat lipid panel this admission.  Pancreatic insufficiency: takes creon  with meals.   Prediabetes: Patient's last A1c in prediabetic range in 2022.  Will repeat A1c here.  Will monitor daily CBGs as patient does not have diabetes.  MDD: Pt on zoloft . Will continue it here.   Class II obesity: on Mounjaro on Tuesdays.  VTE prophylaxis:  SCDs  Diet: N.p.o., if hemoglobin stable can start full liquids Code Status:  Full Code Telemetry:  Admission status: Inpatient, Progressive Patient is from: Home Anticipated d/c is to: Home Anticipated d/c is in: 2-3 days   Family  Communication: Updated at bedside  Consults called: Dr. Therisa with GI   Severity of Illness: The appropriate patient status for this patient is INPATIENT. Inpatient status is judged to be reasonable and necessary in order to provide the required intensity of service to ensure the patient's safety. The patient's presenting symptoms, physical exam findings, and initial radiographic and laboratory data in the context of their chronic comorbidities is felt to place them at high risk for further clinical deterioration. Furthermore, it is not anticipated that the patient will be medically stable for discharge from the hospital within 2 midnights of admission.   * I certify that at the point of admission it is my clinical judgment that the patient will require inpatient hospital care spanning beyond 2 midnights from the point of admission due to high intensity of service, high risk for further deterioration and high frequency of surveillance required.DEWAINE Morene Bathe, MD Jolynn DEL. [facility]      [1]  Allergies Allergen Reactions   Apple Anaphylaxis    Throat swells but subsides with po benadry   Ivp Dye [Iodinated Contrast Media] Shortness Of Breath    Also swelling.  Topical betadine  is OK.   Tape Other (See Comments)    Most tapes case raw skin.  Paper tape is OK.   Wound Dressing Adhesive Itching    Most tapes case raw skin.  Paper tape is OK.   "

## 2024-10-08 NOTE — ED Notes (Signed)
 Pt family reports continually bleeding from rectum, filling toilet while in lobby.

## 2024-10-08 NOTE — ED Notes (Signed)
 Called CCMD to add pt to monitoring.

## 2024-10-09 DIAGNOSIS — K5791 Diverticulosis of intestine, part unspecified, without perforation or abscess with bleeding: Secondary | ICD-10-CM | POA: Diagnosis not present

## 2024-10-09 LAB — CBC
HCT: 36.8 % (ref 36.0–46.0)
Hemoglobin: 12.1 g/dL (ref 12.0–15.0)
MCH: 30.4 pg (ref 26.0–34.0)
MCHC: 32.9 g/dL (ref 30.0–36.0)
MCV: 92.5 fL (ref 80.0–100.0)
Platelets: 193 K/uL (ref 150–400)
RBC: 3.98 MIL/uL (ref 3.87–5.11)
RDW: 13.9 % (ref 11.5–15.5)
WBC: 9.1 K/uL (ref 4.0–10.5)
nRBC: 0 % (ref 0.0–0.2)

## 2024-10-09 LAB — GLUCOSE, CAPILLARY: Glucose-Capillary: 118 mg/dL — ABNORMAL HIGH (ref 70–99)

## 2024-10-09 LAB — BASIC METABOLIC PANEL WITH GFR
Anion gap: 14 (ref 5–15)
BUN: 27 mg/dL — ABNORMAL HIGH (ref 8–23)
CO2: 22 mmol/L (ref 22–32)
Calcium: 8.8 mg/dL — ABNORMAL LOW (ref 8.9–10.3)
Chloride: 104 mmol/L (ref 98–111)
Creatinine, Ser: 0.88 mg/dL (ref 0.44–1.00)
GFR, Estimated: >60 mL/min
Glucose, Bld: 125 mg/dL — ABNORMAL HIGH (ref 70–99)
Potassium: 4.3 mmol/L (ref 3.5–5.1)
Sodium: 140 mmol/L (ref 135–145)

## 2024-10-09 MED ORDER — PANCRELIPASE (LIP-PROT-AMYL) 36000-114000 UNITS PO CPEP
108000.0000 [IU] | ORAL_CAPSULE | Freq: Three times a day (TID) | ORAL | Status: DC
  Administered 2024-10-09 – 2024-10-12 (×6): 108000 [IU] via ORAL
  Filled 2024-10-09 (×11): qty 3

## 2024-10-09 MED ORDER — OLOPATADINE HCL 0.1 % OP SOLN
1.0000 [drp] | Freq: Two times a day (BID) | OPHTHALMIC | Status: DC
  Administered 2024-10-09 – 2024-10-11 (×6): 1 [drp] via OPHTHALMIC
  Filled 2024-10-09: qty 5

## 2024-10-09 MED ORDER — VITAMIN D 25 MCG (1000 UNIT) PO TABS
1000.0000 [IU] | ORAL_TABLET | Freq: Every day | ORAL | Status: DC
  Administered 2024-10-09 – 2024-10-11 (×3): 1000 [IU] via ORAL
  Filled 2024-10-09 (×4): qty 1

## 2024-10-09 MED ORDER — AZELASTINE HCL 0.1 % NA SOLN
1.0000 | Freq: Every day | NASAL | Status: DC
  Administered 2024-10-09 – 2024-10-11 (×3): 1 via NASAL
  Filled 2024-10-09: qty 30

## 2024-10-09 MED ORDER — LOSARTAN POTASSIUM 25 MG PO TABS
12.5000 mg | ORAL_TABLET | Freq: Every day | ORAL | Status: DC
  Administered 2024-10-09 – 2024-10-11 (×3): 12.5 mg via ORAL
  Filled 2024-10-09 (×4): qty 1

## 2024-10-09 MED ORDER — SERTRALINE HCL 50 MG PO TABS
50.0000 mg | ORAL_TABLET | Freq: Every day | ORAL | Status: DC
  Administered 2024-10-09 – 2024-10-11 (×3): 50 mg via ORAL
  Filled 2024-10-09 (×4): qty 1

## 2024-10-09 NOTE — Progress Notes (Signed)
 "  Ruel Kung , MD [address], [place], [place], [address] Phone: [phone] Fax: [phone]   Crystal Haas is being followed for gi bleed  Day 2 of follow up   Subjective: Overnight had a CT angiogram That showed bleeding in the sigmoid colon following which underwent interventional radiology evaluation no active bleeding was seen in the injected the contrast. Still had a bowel movement with clots in it  Hemoglobin overnight 12.1 after 2 units blood transfusion.  Objective: Vital signs in last 24 hours: Vitals:   10/08/24 2309 10/09/24 0400 10/09/24 0506 10/09/24 0705  BP: (!) 161/83 (!) 147/85  (!) 150/75  Pulse: 91 95  66  Resp: 18 (!) 40  (!) 21  Temp: 97.7 F (36.5 C) 98.2 F (36.8 C)  98.5 F (36.9 C)  TempSrc:  Oral  Axillary  SpO2: 96% 92%  96%  Weight:   88.1 kg   Height:       Weight change:   Intake/Output Summary (Last 24 hours) at 10/09/2024 9161 Last data filed at 10/08/2024 2237 Gross per 24 hour  Intake 3 ml  Output --  Net 3 ml     Exam: Heart:: Regular rate and rhythm or S1S2 present Lungs: normal Abdomen: soft, nontender, normal bowel sounds   Lab Results: @LABTEST2 @ Micro Results: No results found for this or any previous visit (from the past 240 hours). Studies/Results: CT ANGIO GI BLEED Result Date: 10/08/2024 EXAM: CTA ABDOMEN AND PELVIS WITH CONTRAST 10/08/2024 04:10:14 PM TECHNIQUE: CTA images of the abdomen and pelvis with intravenous contrast. Three-dimensional MIP/volume rendered formations were performed. Automated exposure control, iterative reconstruction, and/or weight based adjustment of the mA/kV was utilized to reduce the radiation dose to as low as reasonably achievable. COMPARISON: CT abdomen and pelvis 03/01/2024. CLINICAL HISTORY: GI bleed on thinners, eval for source. Maroon stool/blood. Elevated BUN. FINDINGS: VASCULATURE: GI BLEED: There is a small area of acute hemorrhage within the mid sigmoid colon on  image 7/135. AORTA: There is mild calcified atherosclerotic disease throughout the aorta. No acute finding. No abdominal aortic aneurysm. No dissection. CELIAC TRUNK: No acute finding. No occlusion or significant stenosis. SUPERIOR MESENTERIC ARTERY: No acute finding. No occlusion or significant stenosis. INFERIOR MESENTERIC ARTERY: No acute finding. No occlusion or significant stenosis. RENAL ARTERIES: No acute finding. No occlusion or significant stenosis. ILIAC ARTERIES: No acute finding. No occlusion or significant stenosis. ABDOMEN/PELVIS: LOWER CHEST: Visualized portion of the lower chest demonstrates no acute abnormality. LIVER: The liver is unremarkable. GALLBLADDER AND BILE DUCTS: Gallbladder is unremarkable. No biliary ductal dilatation. SPLEEN: The spleen is unremarkable. PANCREAS: The pancreas is unremarkable. ADRENAL GLANDS: Bilateral adrenal glands demonstrate no acute abnormality. KIDNEYS, URETERS AND BLADDER: No stones in the kidneys or ureters. No hydronephrosis. No perinephric or periureteral stranding. Urinary bladder is unremarkable. GI AND BOWEL: Stomach and duodenal sweep demonstrate no acute abnormality. There is a small area of acute hemorrhage within the mid sigmoid colon on image 7/135. There is no inflammation at this level. There is sigmoid colon diverticulosis. The appendix appears normal. There is no bowel obstruction. No abnormal bowel wall thickening or distension. REPRODUCTIVE: Reproductive organs are unremarkable. PERITONEUM AND RETROPERITONEUM: No ascites or free air. LYMPH NODES: No lymphadenopathy. BONES AND SOFT TISSUES: No acute abnormality of the bones. No acute soft tissue abnormality. IMPRESSION: 1. Small area of acute hemorrhage within the mid sigmoid colon without associated inflammation. 2. Sigmoid colon diverticulosis. 3. Mild calcified atherosclerotic disease of the aorta. Electronically  signed by: Greig Pique MD 10/08/2024 05:00 PM EST RP Workstation: [id number]    Medications: I have reviewed the patient's current medications. Scheduled Meds:  pantoprazole  (PROTONIX ) IV  40 mg Intravenous Q12H   sodium chloride  flush  3 mL Intravenous Q12H   Continuous Infusions: PRN Meds:.acetaminophen  **OR** acetaminophen , ondansetron  **OR** ondansetron  (ZOFRAN ) IV   Assessment: Principal Problem:   GI bleeding Active Problems:   Depression   Chronic atrial fibrillation (HCC)   Hypertension   Diverticulosis of large intestine without diverticulitis   Prediabetes   HLD (hyperlipidemia)   History of stroke   Pancreatic insufficiency  with a prior history of sigmoid diverticulosis on colonoscopy back in 2020 on Eliquis  for atrial fibrillation presents to the emergency room with multiple episodes of painless rectal bleeding .  CT angiogram showed active bleed in the sigmoid colon following which interventional radiology attempted to stop the bleeding but there was no active bleeding when they intervened.  Very likely a diverticular bleed.   Recommendations Monitor CBC and transfuse as needed She is still passing some blood clots she says ? Old blood vs new . On Monday she will be off Eliquis  for 2 doses will proceed with a colonoscopy to evaluate further which would be ideal but if she has profuse bleeding later today then may need colonoscopy tomorrow provided she can do a bowel prep vs repeat IR embolization attempt.       LOS: 1 day   Ruel Kung, MD 10/09/2024, 8:38 AM  "

## 2024-10-09 NOTE — Progress Notes (Signed)
" °   10/09/24 0730  Spiritual Encounters  Type of Visit Initial  Care provided to: Patient  Referral source Patient request  Reason for visit Routine spiritual support  OnCall Visit Yes   Chaplain prayed with patient per [facility] Consult in the system.  Patient shared concerns about her health and the unknown.  Chaplain celebrated connections she has with Church family and dear friends.    Rev. Rana M. Nicholaus, M.Div. Chaplain Resident [facility] "

## 2024-10-09 NOTE — Progress Notes (Signed)
 " PROGRESS NOTE    Crystal Haas  FMW:[id number] DOB: 1946-07-03 DOA: 10/08/2024 PCP: Sampson Ethridge LABOR, MD    Brief Narrative:   78 y.o. year old female with medical history of hypertension, hyperlipidemia, prediabetes, chronic atrial fibrillation on Eliquis , history of stroke, severe diverticulosis with history of GI bleeding presenting to the ED with rectal bleeding.   Patient reports she had bleeding starting this morning.  She reports bright red blood per rectum with some clots.  She denies any dizziness, lightheadedness.  She denies any hematemesis.  On ROS, she denies any abdominal pain. Total 10 episodes so far. States it is slowing down.    On arrival to the ED patient was noted to be HDS stable.  Lab work and imaging obtained.  CBC unremarkable.  There is a downtrend in the hemoglobin from baseline of 13-14 to 12.7.  CMP is unremarkable overall but there is slight BUN elevation.  Given patient report of rectal bleeding this morning, Dr. Michele spoke with GI who recommended getting CTA and admitting for further workup.  They stated no colonoscopy could be performed until after 2 days since patient has had Eliquis  this morning.  Given this, [facility] contacted for admission.   Assessment & Plan:   Principal Problem:   GI bleeding Active Problems:   Depression   Chronic atrial fibrillation (HCC)   Hypertension   Diverticulosis of large intestine without diverticulitis   Prediabetes   HLD (hyperlipidemia)   History of stroke   Pancreatic insufficiency  Bright red blood per rectum History of diverticular disease Hemoglobin is mid 12's.  Patient is stable and not requiring transfusion.  GI consulted from admission.  CT angiogram with active bleed in the sigmoid colon.  Interventional radiology could not find target intervene.  Suspect diverticular bleed. Plan: Monitor CBC, transfuse as needed Hold Eliquis  Anticipate colonoscopy Monday 12/22 Case discussed with GI   Chronic atrial  fibrillation Patient is in A-fib.  Rate is controlled.   Holding Eliquis  currently given GI bleed as risk significantly outweigh benefits.   Hypertension Continue home losartan    Hyperlipidemia/history of CVA Patient was previously on Lipitor 80 mg.  She is not taking statin currently.   Outpatient follow-up   Pancreatic insufficiency  takes creon  with meals.  Will continue  MDD Continue home Zoloft    Class II obesity On Mounjaro on Tuesdays at home.  On hold.  Complicating factor in overall care and prognosis.   DVT prophylaxis: SCD  Code Status: Full Family Communication: Son via phone 12/20 Disposition Plan: Status is: Inpatient Remains inpatient appropriate because: GI bleed, suspect diverticular   Level of care: Progressive  Consultants:  GI  Procedures:  Colonoscopy planned 12/22  Antimicrobials: None   Subjective: Seen and examined.  Resting comfortably in bed.  No distress.  No complaints.  Still reports some blood per rectum.  Objective: Vitals:   10/09/24 0400 10/09/24 0506 10/09/24 0705 10/09/24 1233  BP: (!) 147/85  (!) 150/75 (!) 140/47  Pulse: 95  66 75  Resp: (!) 40  (!) 21 18  Temp: 98.2 F (36.8 C)  98.5 F (36.9 C) 98 F (36.7 C)  TempSrc: Oral  Axillary Oral  SpO2: 92%  96% 99%  Weight:  88.1 kg    Height:        Intake/Output Summary (Last 24 hours) at 10/09/2024 1426 Last data filed at 10/08/2024 2237 Gross per 24 hour  Intake 3 ml  Output --  Net 3 ml  Filed Weights   10/08/24 1025 10/09/24 0506  Weight: 97.4 kg 88.1 kg    Examination:  General exam: Appears calm and comfortable  Respiratory system: Clear to auscultation. Respiratory effort normal. Cardiovascular system: S1 S2, RRR, no murmurs, no pedal edema Gastrointestinal system: Soft, NT/ND, normal bowel sounds Central nervous system: Alert and oriented. No focal neurological deficits. Extremities: Symmetric 5 x 5 power. Skin: No rashes, lesions or  ulcers Psychiatry: Judgement and insight appear normal. Mood & affect appropriate.     Data Reviewed: I have personally reviewed following labs and imaging studies  CBC: Recent Labs  Lab 10/08/24 1030 10/08/24 2037 10/09/24 0235  WBC 7.2  --  9.1  HGB 12.7 12.4 12.1  HCT 39.2 37.5 36.8  MCV 93.6  --  92.5  PLT 182  --  193   Basic Metabolic Panel: Recent Labs  Lab 10/08/24 1030 10/09/24 0235  NA 139 140  K 4.8 4.3  CL 105 104  CO2 26 22  GLUCOSE 95 125*  BUN 27* 27*  CREATININE 0.76 0.88  CALCIUM  9.0 8.8*  MG 2.0  --    GFR: Estimated Creatinine Clearance: 58.7 mL/min (by C-G formula based on SCr of 0.88 mg/dL). Liver Function Tests: Recent Labs  Lab 10/08/24 1030  AST 18  ALT 14  ALKPHOS 53  BILITOT 0.4  PROT 6.5  ALBUMIN 4.1   No results for input(s): LIPASE, AMYLASE in the last 168 hours. No results for input(s): AMMONIA in the last 168 hours. Coagulation Profile: No results for input(s): INR, PROTIME in the last 168 hours. Cardiac Enzymes: No results for input(s): CKTOTAL, CKMB, CKMBINDEX, TROPONINI in the last 168 hours. BNP (last 3 results) No results for input(s): PROBNP in the last 8760 hours. HbA1C: No results for input(s): HGBA1C in the last 72 hours. CBG: Recent Labs  Lab 10/09/24 0743  GLUCAP 118*   Lipid Profile: No results for input(s): CHOL, HDL, LDLCALC, TRIG, CHOLHDL, LDLDIRECT in the last 72 hours. Thyroid  Function Tests: No results for input(s): TSH, T4TOTAL, FREET4, T3FREE, THYROIDAB in the last 72 hours. Anemia Panel: No results for input(s): VITAMINB12, FOLATE, FERRITIN, TIBC, IRON, RETICCTPCT in the last 72 hours. Sepsis Labs: No results for input(s): PROCALCITON, LATICACIDVEN in the last 168 hours.  No results found for this or any previous visit (from the past 240 hours).       Radiology Studies: IR Angiogram Visceral Selective Result Date:  10/09/2024 EXAM: Inferior Mesenteric arteriogram 10/08/2024 08:02:00 PM CLINICAL HISTORY: Acute GI bleed, localized to sigmoid colon on recent CTA. COMPARISON: None available TECHNIQUE: Moderate sedation was provided to the patient throughout the procedure. I ordered the medications used, and was present face-to-face with the patient from the time of the first dose through the end of the moderate sedation. An independent trained observer was present throughout the moderate sedation, and was solely responsible for monitoring the patient's moderate sedation. Moderate sedation time: 5 minutes. 1 mg IV Versed  was administered with 50 mcg IV fentanyl . Informed consent for the procedure including risks, benefits and alternatives was obtained and time-out was performed prior to the procedure. The site was prepared and draped using maximal sterile barrier technique including cutaneous antisepsis. Local anesthesia was administered. All aspects of maximum sterile barrier technique were used including washing hands with conventional soap and water or with alcohol based hand rub (ABHR), skin preparation, cap, mask, sterile gown, sterile gloves and sterile full body draped. Sterile ultrasound techniques were followed including sterile gel and sterile  probe covers. Ultrasound was utilized to evaluate for potential sites of access and determine patency. Real time ultrasound was used to visualize needle entry into the vessel, and a permanent image was stored. Arterial access was obtained via the right common femoral artery using a micropuncture set with a 21 gauge needle. A 5 French sheath was placed. The vessel was patent. The abdominal aorta below the level of the renal arteries was catheterized. The inferior mesenteric arterial system was catheterized using a 5 French Sos catheter, and coaxial Renegade microcatheter with Fathom guidewire. Selective mesenteric angiography of the inferior mesenteric artery was performed. Findings  include no active extravasation, a-v malformation, or other pathologic findings. Superselective mesenteric angiography was performed of 3 separate 2nd order branches of the inferior mesenteric artery anatomically corresponding to the region of concern on CTA. Findings include no active extravasation, AVM, or other pathologic findings. Angiography of the interrogated vessels demonstrates no continued active GI bleed in the region of concern. The access site was closed with a Celt closure device under real time ultrasound guidance. The patient tolerated the procedure well without immediate postprocedural complications. No immediate complications. No intraprocedure events. Estimated blood loss: Less than 5 mL. Fluoroscopy dose area product: 116.1 Gy*cm\S\2 150 mL iohexol  (OMNIPAQUE ) 300 MG/ML solution was administered. IMPRESSION: 1. No evidence of active GI bleed in the region of concern. Electronically signed by: Katheleen Faes MD 10/09/2024 09:08 AM EST RP Workstation: [id number]   CT ANGIO GI BLEED Result Date: 10/08/2024 EXAM: CTA ABDOMEN AND PELVIS WITH CONTRAST 10/08/2024 04:10:14 PM TECHNIQUE: CTA images of the abdomen and pelvis with intravenous contrast. Three-dimensional MIP/volume rendered formations were performed. Automated exposure control, iterative reconstruction, and/or weight based adjustment of the mA/kV was utilized to reduce the radiation dose to as low as reasonably achievable. COMPARISON: CT abdomen and pelvis 03/01/2024. CLINICAL HISTORY: GI bleed on thinners, eval for source. Maroon stool/blood. Elevated BUN. FINDINGS: VASCULATURE: GI BLEED: There is a small area of acute hemorrhage within the mid sigmoid colon on image 7/135. AORTA: There is mild calcified atherosclerotic disease throughout the aorta. No acute finding. No abdominal aortic aneurysm. No dissection. CELIAC TRUNK: No acute finding. No occlusion or significant stenosis. SUPERIOR MESENTERIC ARTERY: No acute finding. No occlusion  or significant stenosis. INFERIOR MESENTERIC ARTERY: No acute finding. No occlusion or significant stenosis. RENAL ARTERIES: No acute finding. No occlusion or significant stenosis. ILIAC ARTERIES: No acute finding. No occlusion or significant stenosis. ABDOMEN/PELVIS: LOWER CHEST: Visualized portion of the lower chest demonstrates no acute abnormality. LIVER: The liver is unremarkable. GALLBLADDER AND BILE DUCTS: Gallbladder is unremarkable. No biliary ductal dilatation. SPLEEN: The spleen is unremarkable. PANCREAS: The pancreas is unremarkable. ADRENAL GLANDS: Bilateral adrenal glands demonstrate no acute abnormality. KIDNEYS, URETERS AND BLADDER: No stones in the kidneys or ureters. No hydronephrosis. No perinephric or periureteral stranding. Urinary bladder is unremarkable. GI AND BOWEL: Stomach and duodenal sweep demonstrate no acute abnormality. There is a small area of acute hemorrhage within the mid sigmoid colon on image 7/135. There is no inflammation at this level. There is sigmoid colon diverticulosis. The appendix appears normal. There is no bowel obstruction. No abnormal bowel wall thickening or distension. REPRODUCTIVE: Reproductive organs are unremarkable. PERITONEUM AND RETROPERITONEUM: No ascites or free air. LYMPH NODES: No lymphadenopathy. BONES AND SOFT TISSUES: No acute abnormality of the bones. No acute soft tissue abnormality. IMPRESSION: 1. Small area of acute hemorrhage within the mid sigmoid colon without associated inflammation. 2. Sigmoid colon diverticulosis. 3. Mild calcified atherosclerotic disease of  the aorta. Electronically signed by: Greig Pique MD 10/08/2024 05:00 PM EST RP Workstation: [id number]        Scheduled Meds:  azelastine   1 spray Each Nare Daily   cholecalciferol   1,000 Units Oral Daily   lipase/protease/amylase  108,000 Units Oral TID AC   losartan   12.5 mg Oral Daily   olopatadine   1 drop Right Eye BID   pantoprazole  (PROTONIX ) IV  40 mg Intravenous Q12H    sertraline   50 mg Oral Daily   sodium chloride  flush  3 mL Intravenous Q12H   Continuous Infusions:   LOS: 1 day    Calvin KATHEE Robson, MD [facility]   If 7PM-7AM, please contact night-coverage  10/09/2024, 2:26 PM   "

## 2024-10-09 NOTE — Plan of Care (Signed)
   Problem: Education: Goal: Knowledge of General Education information will improve Description: Including pain rating scale, medication(s)/side effects and non-pharmacologic comfort measures Outcome: Progressing   Problem: Health Behavior/Discharge Planning: Goal: Ability to manage health-related needs will improve Outcome: Progressing   Problem: Clinical Measurements: Goal: Will remain free from infection Outcome: Progressing

## 2024-10-10 DIAGNOSIS — K5731 Diverticulosis of large intestine without perforation or abscess with bleeding: Secondary | ICD-10-CM

## 2024-10-10 LAB — CBC WITH DIFFERENTIAL/PLATELET
Abs Immature Granulocytes: 0.05 K/uL (ref 0.00–0.07)
Basophils Absolute: 0.1 K/uL (ref 0.0–0.1)
Basophils Relative: 1 %
Eosinophils Absolute: 0.6 K/uL — ABNORMAL HIGH (ref 0.0–0.5)
Eosinophils Relative: 5 %
HCT: 30.6 % — ABNORMAL LOW (ref 36.0–46.0)
Hemoglobin: 10.1 g/dL — ABNORMAL LOW (ref 12.0–15.0)
Immature Granulocytes: 0 %
Lymphocytes Relative: 19 %
Lymphs Abs: 2.2 K/uL (ref 0.7–4.0)
MCH: 30.8 pg (ref 26.0–34.0)
MCHC: 33 g/dL (ref 30.0–36.0)
MCV: 93.3 fL (ref 80.0–100.0)
Monocytes Absolute: 1.2 K/uL — ABNORMAL HIGH (ref 0.1–1.0)
Monocytes Relative: 10 %
Neutro Abs: 7.2 K/uL (ref 1.7–7.7)
Neutrophils Relative %: 65 %
Platelets: 170 K/uL (ref 150–400)
RBC: 3.28 MIL/uL — ABNORMAL LOW (ref 3.87–5.11)
RDW: 14.3 % (ref 11.5–15.5)
WBC: 11.4 K/uL — ABNORMAL HIGH (ref 4.0–10.5)
nRBC: 0 % (ref 0.0–0.2)

## 2024-10-10 LAB — CBC
HCT: 33.1 % — ABNORMAL LOW (ref 36.0–46.0)
Hemoglobin: 11.1 g/dL — ABNORMAL LOW (ref 12.0–15.0)
MCH: 30.7 pg (ref 26.0–34.0)
MCHC: 33.5 g/dL (ref 30.0–36.0)
MCV: 91.4 fL (ref 80.0–100.0)
Platelets: 214 K/uL (ref 150–400)
RBC: 3.62 MIL/uL — ABNORMAL LOW (ref 3.87–5.11)
RDW: 14.3 % (ref 11.5–15.5)
WBC: 11.2 K/uL — ABNORMAL HIGH (ref 4.0–10.5)
nRBC: 0 % (ref 0.0–0.2)

## 2024-10-10 LAB — GLUCOSE, CAPILLARY: Glucose-Capillary: 77 mg/dL (ref 70–99)

## 2024-10-10 MED ORDER — PEG 3350-KCL-NA BICARB-NACL 420 G PO SOLR
2000.0000 mL | Freq: Once | ORAL | Status: AC
  Administered 2024-10-10: 2000 mL via ORAL
  Filled 2024-10-10: qty 4000

## 2024-10-10 MED ORDER — PROCHLORPERAZINE EDISYLATE 10 MG/2ML IJ SOLN
10.0000 mg | Freq: Once | INTRAMUSCULAR | Status: AC
  Administered 2024-10-11: 10 mg via INTRAVENOUS
  Filled 2024-10-10: qty 2

## 2024-10-10 MED ORDER — SODIUM CHLORIDE 0.9 % IV SOLN: INTRAVENOUS | Status: DC

## 2024-10-10 MED ORDER — PEG 3350-KCL-NA BICARB-NACL 420 G PO SOLR
2000.0000 mL | Freq: Once | ORAL | Status: AC
  Administered 2024-10-11: 2000 mL via ORAL

## 2024-10-10 NOTE — Plan of Care (Signed)
 ?  Problem: Clinical Measurements: ?Goal: Ability to maintain clinical measurements within normal limits will improve ?Outcome: Progressing ?Goal: Will remain free from infection ?Outcome: Progressing ?Goal: Diagnostic test results will improve ?Outcome: Progressing ?  ?

## 2024-10-10 NOTE — Progress Notes (Signed)
 "  Ruel Kung , MD [address], [address], [place], [place], [address] Phone: [phone] Fax: [phone]   Crystal Haas is being followed for lower gi bleed  Day 2 of follow up   Subjective: Says still having some blood in the stools but decreasing, she is more concerned about the fecal incontinence when she passes urine.   Objective: Vital signs in last 24 hours: Vitals:   10/10/24 0111 10/10/24 0359 10/10/24 0400 10/10/24 0749  BP: (!) 132/58 (!) 124/47  132/63  Pulse: 73 69  76  Resp: 18 17  18   Temp: 98 F (36.7 C) 97.9 F (36.6 C)  98 F (36.7 C)  TempSrc:    Oral  SpO2: 98% 99%  98%  Weight:   87.7 kg   Height:       Weight change: -9.7 kg  Intake/Output Summary (Last 24 hours) at 10/10/2024 9145 Last data filed at 10/09/2024 2153 Gross per 24 hour  Intake 243 ml  Output --  Net 243 ml     Exam: Heart:: S1S2 present Lungs: normal Abdomen: soft, nontender, normal bowel sounds   Lab Results: @LABTEST2 @ Micro Results: No results found for this or any previous visit (from the past 240 hours). Studies/Results: IR Angiogram Visceral Selective Result Date: 10/09/2024 EXAM: Inferior Mesenteric arteriogram 10/08/2024 08:02:00 PM CLINICAL HISTORY: Acute GI bleed, localized to sigmoid colon on recent CTA. COMPARISON: None available TECHNIQUE: Moderate sedation was provided to the patient throughout the procedure. I ordered the medications used, and was present face-to-face with the patient from the time of the first dose through the end of the moderate sedation. An independent trained observer was present throughout the moderate sedation, and was solely responsible for monitoring the patient's moderate sedation. Moderate sedation time: 5 minutes. 1 mg IV Versed  was administered with 50 mcg IV fentanyl . Informed consent for the procedure including risks, benefits and alternatives was obtained and time-out was performed prior to the procedure. The site was  prepared and draped using maximal sterile barrier technique including cutaneous antisepsis. Local anesthesia was administered. All aspects of maximum sterile barrier technique were used including washing hands with conventional soap and water or with alcohol based hand rub (ABHR), skin preparation, cap, mask, sterile gown, sterile gloves and sterile full body draped. Sterile ultrasound techniques were followed including sterile gel and sterile probe covers. Ultrasound was utilized to evaluate for potential sites of access and determine patency. Real time ultrasound was used to visualize needle entry into the vessel, and a permanent image was stored. Arterial access was obtained via the right common femoral artery using a micropuncture set with a 21 gauge needle. A 5 French sheath was placed. The vessel was patent. The abdominal aorta below the level of the renal arteries was catheterized. The inferior mesenteric arterial system was catheterized using a 5 French Sos catheter, and coaxial Renegade microcatheter with Fathom guidewire. Selective mesenteric angiography of the inferior mesenteric artery was performed. Findings include no active extravasation, a-v malformation, or other pathologic findings. Superselective mesenteric angiography was performed of 3 separate 2nd order branches of the inferior mesenteric artery anatomically corresponding to the region of concern on CTA. Findings include no active extravasation, AVM, or other pathologic findings. Angiography of the interrogated vessels demonstrates no continued active GI bleed in the region of concern. The access site was closed with a Celt closure device under real time ultrasound guidance. The patient tolerated the procedure well without immediate postprocedural complications. No immediate complications. No intraprocedure  events. Estimated blood loss: Less than 5 mL. Fluoroscopy dose area product: 116.1 Gy*cm\S\2 150 mL iohexol  (OMNIPAQUE ) 300 MG/ML solution  was administered. IMPRESSION: 1. No evidence of active GI bleed in the region of concern. Electronically signed by: Katheleen Faes MD 10/09/2024 09:08 AM EST RP Workstation: [id number]   CT ANGIO GI BLEED Result Date: 10/08/2024 EXAM: CTA ABDOMEN AND PELVIS WITH CONTRAST 10/08/2024 04:10:14 PM TECHNIQUE: CTA images of the abdomen and pelvis with intravenous contrast. Three-dimensional MIP/volume rendered formations were performed. Automated exposure control, iterative reconstruction, and/or weight based adjustment of the mA/kV was utilized to reduce the radiation dose to as low as reasonably achievable. COMPARISON: CT abdomen and pelvis 03/01/2024. CLINICAL HISTORY: GI bleed on thinners, eval for source. Maroon stool/blood. Elevated BUN. FINDINGS: VASCULATURE: GI BLEED: There is a small area of acute hemorrhage within the mid sigmoid colon on image 7/135. AORTA: There is mild calcified atherosclerotic disease throughout the aorta. No acute finding. No abdominal aortic aneurysm. No dissection. CELIAC TRUNK: No acute finding. No occlusion or significant stenosis. SUPERIOR MESENTERIC ARTERY: No acute finding. No occlusion or significant stenosis. INFERIOR MESENTERIC ARTERY: No acute finding. No occlusion or significant stenosis. RENAL ARTERIES: No acute finding. No occlusion or significant stenosis. ILIAC ARTERIES: No acute finding. No occlusion or significant stenosis. ABDOMEN/PELVIS: LOWER CHEST: Visualized portion of the lower chest demonstrates no acute abnormality. LIVER: The liver is unremarkable. GALLBLADDER AND BILE DUCTS: Gallbladder is unremarkable. No biliary ductal dilatation. SPLEEN: The spleen is unremarkable. PANCREAS: The pancreas is unremarkable. ADRENAL GLANDS: Bilateral adrenal glands demonstrate no acute abnormality. KIDNEYS, URETERS AND BLADDER: No stones in the kidneys or ureters. No hydronephrosis. No perinephric or periureteral stranding. Urinary bladder is unremarkable. GI AND BOWEL: Stomach  and duodenal sweep demonstrate no acute abnormality. There is a small area of acute hemorrhage within the mid sigmoid colon on image 7/135. There is no inflammation at this level. There is sigmoid colon diverticulosis. The appendix appears normal. There is no bowel obstruction. No abnormal bowel wall thickening or distension. REPRODUCTIVE: Reproductive organs are unremarkable. PERITONEUM AND RETROPERITONEUM: No ascites or free air. LYMPH NODES: No lymphadenopathy. BONES AND SOFT TISSUES: No acute abnormality of the bones. No acute soft tissue abnormality. IMPRESSION: 1. Small area of acute hemorrhage within the mid sigmoid colon without associated inflammation. 2. Sigmoid colon diverticulosis. 3. Mild calcified atherosclerotic disease of the aorta. Electronically signed by: Greig Pique MD 10/08/2024 05:00 PM EST RP Workstation: [id number]   Medications: I have reviewed the patient's current medications. Scheduled Meds:  azelastine   1 spray Each Nare Daily   cholecalciferol   1,000 Units Oral Daily   lipase/protease/amylase  108,000 Units Oral TID AC   losartan   12.5 mg Oral Daily   olopatadine   1 drop Right Eye BID   pantoprazole  (PROTONIX ) IV  40 mg Intravenous Q12H   sertraline   50 mg Oral Daily   sodium chloride  flush  3 mL Intravenous Q12H   Continuous Infusions: PRN Meds:.acetaminophen  **OR** acetaminophen , ondansetron  **OR** ondansetron  (ZOFRAN ) IV   Assessment: Principal Problem:   GI bleeding Active Problems:   Depression   Chronic atrial fibrillation (HCC)   Hypertension   Diverticulosis of large intestine without diverticulitis   Prediabetes   HLD (hyperlipidemia)   History of stroke   Pancreatic insufficiency  Crystal Haas 78 y.o. female with a prior history of sigmoid diverticulosis on colonoscopy back in 2020 on Eliquis  for atrial fibrillation presents to the emergency room with multiple episodes of painless rectal bleeding .  CT angiogram  showed active bleed in the sigmoid  colon following which interventional radiology attempted to stop the bleeding but there was no active bleeding when they intervened.  Very likely a diverticular bleed. HB 12.1---> 10.1    Recommendations Monitor CBC and transfuse as needed Colonoscopy on Monday (2 days off elquis ) I explained that Dr. Jinny will perform the colonoscopy and subsequently can explain to her if anything is abnormal that could explain fecal incontinence associated with urinary passage which has been going on for a while even prior to the acute issue   I have discussed alternative options, risks & benefits,  which include, but are not limited to, bleeding, infection, perforation,respiratory complication & drug reaction.  The patient agrees with this plan & written consent will be obtained.        LOS: 2 days   Ruel Kung, MD 10/10/2024, 8:54 AM  "

## 2024-10-10 NOTE — Care Management Important Message (Signed)
 Important Message  Patient Details  Name: Crystal Haas MRN: [id number] Date of Birth: Jan 11, 1946   Important Message Given:  Yes - Medicare IM     Rojelio [name] 10/10/2024, 1:28 PM

## 2024-10-10 NOTE — Progress Notes (Signed)
 " PROGRESS NOTE    Crystal Haas  FMW:[id number] DOB: [birth year] DOA: 10/08/2024 PCP: Sampson Ethridge LABOR, MD    Brief Narrative:   78 y.o. year old female with medical history of hypertension, hyperlipidemia, prediabetes, chronic atrial fibrillation on Eliquis , history of stroke, severe diverticulosis with history of GI bleeding presenting to the ED with rectal bleeding.   Patient reports she had bleeding starting this morning.  She reports bright red blood per rectum with some clots.  She denies any dizziness, lightheadedness.  She denies any hematemesis.  On ROS, she denies any abdominal pain. Total 10 episodes so far. States it is slowing down.    On arrival to the ED patient was noted to be HDS stable.  Lab work and imaging obtained.  CBC unremarkable.  There is a downtrend in the hemoglobin from baseline of 13-14 to 12.7.  CMP is unremarkable overall but there is slight BUN elevation.  Given patient report of rectal bleeding this morning, Dr. Michele spoke with GI who recommended getting CTA and admitting for further workup.  They stated no colonoscopy could be performed until after 2 days since patient has had Eliquis  this morning.  Given this, [facility] contacted for admission.   Assessment & Plan:   Principal Problem:   GI bleeding Active Problems:   Depression   Chronic atrial fibrillation (HCC)   Hypertension   Diverticulosis of large intestine without diverticulitis   Prediabetes   HLD (hyperlipidemia)   History of stroke   Pancreatic insufficiency  Bright red blood per rectum History of diverticular disease Hemoglobin is mid 12's.  Patient is stable and not requiring transfusion.  GI consulted from admission.  CT angiogram with active bleed in the sigmoid colon.  Interventional radiology could not find target intervene.  Suspect diverticular bleed. Plan: Monitor CBC, transfuse as needed Hold Eliquis  Anticipate colonoscopy Monday 12/22 Case discussed with GI   Chronic atrial  fibrillation Patient is in A-fib.  Rate is controlled.   Holding Eliquis  currently given GI bleed as risk significantly outweigh benefits.   Hypertension Continue home losartan    Hyperlipidemia/history of CVA Patient was previously on Lipitor 80 mg.  She is not taking statin currently.   Outpatient follow-up   Pancreatic insufficiency  takes creon  with meals.  Will continue  MDD Uncertain severity. Continue home Zoloft    Class II obesity On Mounjaro on Tuesdays at home.  On hold.  Complicating factor in overall care and prognosis.   DVT prophylaxis: SCD  Code Status: Full Family Communication: Son via phone 12/20 Disposition Plan: Status is: Inpatient Remains inpatient appropriate because: GI bleed, suspect diverticular   Level of care: Progressive  Consultants:  GI  Procedures:  Colonoscopy planned 12/22  Antimicrobials: None   Subjective: Seen and examined. Continues to have some bright red blood per rectum. She also notes fecal incontinence that has been longstanding for the past 2 years. She will discuss this with the GI team.   Objective: Vitals:   10/10/24 0359 10/10/24 0400 10/10/24 0749 10/10/24 1153  BP: (!) 124/47  132/63 (!) 126/49  Pulse: 69  76 83  Resp: 17  18 18   Temp: 97.9 F (36.6 C)  98 F (36.7 C) 97.8 F (36.6 C)  TempSrc:   Oral Oral  SpO2: 99%  98% 98%  Weight:  87.7 kg    Height:        Intake/Output Summary (Last 24 hours) at 10/10/2024 1449 Last data filed at 10/10/2024 0900 Gross per 24 hour  Intake 363 ml  Output --  Net 363 ml   Filed Weights   10/08/24 1025 10/09/24 0506 10/10/24 0400  Weight: 97.4 kg 88.1 kg 87.7 kg    Examination:   Constitutional: In no distress.  Cardiovascular: Normal rate, regular rhythm. No lower extremity edema  Pulmonary: Non labored breathing on room air, no wheezing or rales.   Abdominal: Soft. Non distended and non tender Musculoskeletal: Normal range of motion.     Neurological:  Alert and oriented to person, place, and time. Non focal  Skin: Skin is warm and dry.     Data Reviewed: I have personally reviewed following labs and imaging studies  CBC: Recent Labs  Lab 10/08/24 1030 10/08/24 2037 10/09/24 0235 10/10/24 0355  WBC 7.2  --  9.1 11.4*  NEUTROABS  --   --   --  7.2  HGB 12.7 12.4 12.1 10.1*  HCT 39.2 37.5 36.8 30.6*  MCV 93.6  --  92.5 93.3  PLT 182  --  193 170   Basic Metabolic Panel: Recent Labs  Lab 10/08/24 1030 10/09/24 0235  NA 139 140  K 4.8 4.3  CL 105 104  CO2 26 22  GLUCOSE 95 125*  BUN 27* 27*  CREATININE 0.76 0.88  CALCIUM  9.0 8.8*  MG 2.0  --    GFR: Estimated Creatinine Clearance: 58.6 mL/min (by C-G formula based on SCr of 0.88 mg/dL). Liver Function Tests: Recent Labs  Lab 10/08/24 1030  AST 18  ALT 14  ALKPHOS 53  BILITOT 0.4  PROT 6.5  ALBUMIN 4.1   No results for input(s): LIPASE, AMYLASE in the last 168 hours. No results for input(s): AMMONIA in the last 168 hours. Coagulation Profile: No results for input(s): INR, PROTIME in the last 168 hours. Cardiac Enzymes: No results for input(s): CKTOTAL, CKMB, CKMBINDEX, TROPONINI in the last 168 hours. BNP (last 3 results) No results for input(s): PROBNP in the last 8760 hours. HbA1C: No results for input(s): HGBA1C in the last 72 hours. CBG: Recent Labs  Lab 10/09/24 0743 10/10/24 0745  GLUCAP 118* 77   Lipid Profile: No results for input(s): CHOL, HDL, LDLCALC, TRIG, CHOLHDL, LDLDIRECT in the last 72 hours. Thyroid  Function Tests: No results for input(s): TSH, T4TOTAL, FREET4, T3FREE, THYROIDAB in the last 72 hours. Anemia Panel: No results for input(s): VITAMINB12, FOLATE, FERRITIN, TIBC, IRON, RETICCTPCT in the last 72 hours. Sepsis Labs: No results for input(s): PROCALCITON, LATICACIDVEN in the last 168 hours.  No results found for this or any previous visit (from the past 240  hours).       Radiology Studies: IR Angiogram Visceral Selective Result Date: 10/09/2024 EXAM: Inferior Mesenteric arteriogram 10/08/2024 08:02:00 PM CLINICAL HISTORY: Acute GI bleed, localized to sigmoid colon on recent CTA. COMPARISON: None available TECHNIQUE: Moderate sedation was provided to the patient throughout the procedure. I ordered the medications used, and was present face-to-face with the patient from the time of the first dose through the end of the moderate sedation. An independent trained observer was present throughout the moderate sedation, and was solely responsible for monitoring the patient's moderate sedation. Moderate sedation time: 5 minutes. 1 mg IV Versed  was administered with 50 mcg IV fentanyl . Informed consent for the procedure including risks, benefits and alternatives was obtained and time-out was performed prior to the procedure. The site was prepared and draped using maximal sterile barrier technique including cutaneous antisepsis. Local anesthesia was administered. All aspects of maximum sterile barrier technique were used including  washing hands with conventional soap and water or with alcohol based hand rub (ABHR), skin preparation, cap, mask, sterile gown, sterile gloves and sterile full body draped. Sterile ultrasound techniques were followed including sterile gel and sterile probe covers. Ultrasound was utilized to evaluate for potential sites of access and determine patency. Real time ultrasound was used to visualize needle entry into the vessel, and a permanent image was stored. Arterial access was obtained via the right common femoral artery using a micropuncture set with a 21 gauge needle. A 5 French sheath was placed. The vessel was patent. The abdominal aorta below the level of the renal arteries was catheterized. The inferior mesenteric arterial system was catheterized using a 5 French Sos catheter, and coaxial Renegade microcatheter with Fathom guidewire.  Selective mesenteric angiography of the inferior mesenteric artery was performed. Findings include no active extravasation, a-v malformation, or other pathologic findings. Superselective mesenteric angiography was performed of 3 separate 2nd order branches of the inferior mesenteric artery anatomically corresponding to the region of concern on CTA. Findings include no active extravasation, AVM, or other pathologic findings. Angiography of the interrogated vessels demonstrates no continued active GI bleed in the region of concern. The access site was closed with a Celt closure device under real time ultrasound guidance. The patient tolerated the procedure well without immediate postprocedural complications. No immediate complications. No intraprocedure events. Estimated blood loss: Less than 5 mL. Fluoroscopy dose area product: 116.1 Gy*cm\S\2 150 mL iohexol  (OMNIPAQUE ) 300 MG/ML solution was administered. IMPRESSION: 1. No evidence of active GI bleed in the region of concern. Electronically signed by: Katheleen Faes MD 10/09/2024 09:08 AM EST RP Workstation: [id number]   CT ANGIO GI BLEED Result Date: 10/08/2024 EXAM: CTA ABDOMEN AND PELVIS WITH CONTRAST 10/08/2024 04:10:14 PM TECHNIQUE: CTA images of the abdomen and pelvis with intravenous contrast. Three-dimensional MIP/volume rendered formations were performed. Automated exposure control, iterative reconstruction, and/or weight based adjustment of the mA/kV was utilized to reduce the radiation dose to as low as reasonably achievable. COMPARISON: CT abdomen and pelvis 03/01/2024. CLINICAL HISTORY: GI bleed on thinners, eval for source. Maroon stool/blood. Elevated BUN. FINDINGS: VASCULATURE: GI BLEED: There is a small area of acute hemorrhage within the mid sigmoid colon on image 7/135. AORTA: There is mild calcified atherosclerotic disease throughout the aorta. No acute finding. No abdominal aortic aneurysm. No dissection. CELIAC TRUNK: No acute finding. No  occlusion or significant stenosis. SUPERIOR MESENTERIC ARTERY: No acute finding. No occlusion or significant stenosis. INFERIOR MESENTERIC ARTERY: No acute finding. No occlusion or significant stenosis. RENAL ARTERIES: No acute finding. No occlusion or significant stenosis. ILIAC ARTERIES: No acute finding. No occlusion or significant stenosis. ABDOMEN/PELVIS: LOWER CHEST: Visualized portion of the lower chest demonstrates no acute abnormality. LIVER: The liver is unremarkable. GALLBLADDER AND BILE DUCTS: Gallbladder is unremarkable. No biliary ductal dilatation. SPLEEN: The spleen is unremarkable. PANCREAS: The pancreas is unremarkable. ADRENAL GLANDS: Bilateral adrenal glands demonstrate no acute abnormality. KIDNEYS, URETERS AND BLADDER: No stones in the kidneys or ureters. No hydronephrosis. No perinephric or periureteral stranding. Urinary bladder is unremarkable. GI AND BOWEL: Stomach and duodenal sweep demonstrate no acute abnormality. There is a small area of acute hemorrhage within the mid sigmoid colon on image 7/135. There is no inflammation at this level. There is sigmoid colon diverticulosis. The appendix appears normal. There is no bowel obstruction. No abnormal bowel wall thickening or distension. REPRODUCTIVE: Reproductive organs are unremarkable. PERITONEUM AND RETROPERITONEUM: No ascites or free air. LYMPH NODES: No lymphadenopathy. BONES AND SOFT  TISSUES: No acute abnormality of the bones. No acute soft tissue abnormality. IMPRESSION: 1. Small area of acute hemorrhage within the mid sigmoid colon without associated inflammation. 2. Sigmoid colon diverticulosis. 3. Mild calcified atherosclerotic disease of the aorta. Electronically signed by: Greig Pique MD 10/08/2024 05:00 PM EST RP Workstation: [id number]        Scheduled Meds:  azelastine   1 spray Each Nare Daily   cholecalciferol   1,000 Units Oral Daily   lipase/protease/amylase  108,000 Units Oral TID AC   losartan   12.5 mg Oral  Daily   olopatadine   1 drop Right Eye BID   pantoprazole  (PROTONIX ) IV  40 mg Intravenous Q12H   polyethylene glycol-electrolytes  2,000 mL Oral Once   Followed by   [START ON 10/11/2024] polyethylene glycol-electrolytes  2,000 mL Oral Once   sertraline   50 mg Oral Daily   sodium chloride  flush  3 mL Intravenous Q12H   Continuous Infusions:  sodium chloride        LOS: 2 days    Alban Pepper, MD [facility]   If 7PM-7AM, please contact night-coverage  10/10/2024, 2:49 PM   "

## 2024-10-11 ENCOUNTER — Inpatient Hospital Stay: Admitting: Anesthesiology

## 2024-10-11 ENCOUNTER — Encounter: Payer: Self-pay | Admitting: Internal Medicine

## 2024-10-11 DIAGNOSIS — K921 Melena: Secondary | ICD-10-CM

## 2024-10-11 DIAGNOSIS — K5791 Diverticulosis of intestine, part unspecified, without perforation or abscess with bleeding: Secondary | ICD-10-CM | POA: Diagnosis not present

## 2024-10-11 HISTORY — PX: COLONOSCOPY: SHX5424

## 2024-10-11 LAB — CBC
HCT: 32.9 % — ABNORMAL LOW (ref 36.0–46.0)
Hemoglobin: 11 g/dL — ABNORMAL LOW (ref 12.0–15.0)
MCH: 30.8 pg (ref 26.0–34.0)
MCHC: 33.4 g/dL (ref 30.0–36.0)
MCV: 92.2 fL (ref 80.0–100.0)
Platelets: 189 K/uL (ref 150–400)
RBC: 3.57 MIL/uL — ABNORMAL LOW (ref 3.87–5.11)
RDW: 14.2 % (ref 11.5–15.5)
WBC: 11.6 K/uL — ABNORMAL HIGH (ref 4.0–10.5)
nRBC: 0 % (ref 0.0–0.2)

## 2024-10-11 LAB — BASIC METABOLIC PANEL WITH GFR
Anion gap: 10 (ref 5–15)
BUN: 33 mg/dL — ABNORMAL HIGH (ref 8–23)
CO2: 25 mmol/L (ref 22–32)
Calcium: 8.5 mg/dL — ABNORMAL LOW (ref 8.9–10.3)
Chloride: 105 mmol/L (ref 98–111)
Creatinine, Ser: 0.81 mg/dL (ref 0.44–1.00)
GFR, Estimated: >60 mL/min
Glucose, Bld: 87 mg/dL (ref 70–99)
Potassium: 4.2 mmol/L (ref 3.5–5.1)
Sodium: 140 mmol/L (ref 135–145)

## 2024-10-11 LAB — GLUCOSE, CAPILLARY: Glucose-Capillary: 79 mg/dL (ref 70–99)

## 2024-10-11 SURGERY — COLONOSCOPY
Anesthesia: General

## 2024-10-11 MED ORDER — LIDOCAINE HCL (CARDIAC) PF 100 MG/5ML IV SOSY
PREFILLED_SYRINGE | INTRAVENOUS | Status: DC | PRN
  Administered 2024-10-11 (×2): 50 mg via INTRAVENOUS

## 2024-10-11 MED ORDER — SODIUM CHLORIDE 0.9 % IV SOLN: INTRAVENOUS | Status: DC

## 2024-10-11 MED ORDER — NA SULFATE-K SULFATE-MG SULF 17.5-3.13-1.6 GM/177ML PO SOLN
1.0000 | Freq: Once | ORAL | Status: AC
  Administered 2024-10-11: 354 mL via ORAL
  Filled 2024-10-11: qty 1

## 2024-10-11 MED ORDER — NA SULFATE-K SULFATE-MG SULF 17.5-3.13-1.6 GM/177ML PO SOLN: 1.0000 | Freq: Once | ORAL | Status: DC

## 2024-10-11 MED ORDER — PEG 3350-KCL-NA BICARB-NACL 420 G PO SOLR
1000.0000 mL | Freq: Once | ORAL | Status: AC
  Administered 2024-10-11: 1000 mL via ORAL
  Filled 2024-10-11: qty 4000

## 2024-10-11 MED ORDER — LIDOCAINE HCL (PF) 2 % IJ SOLN
INTRAMUSCULAR | Status: AC
  Filled 2024-10-11: qty 5

## 2024-10-11 MED ORDER — PHENYLEPHRINE 80 MCG/ML (10ML) SYRINGE FOR IV PUSH (FOR BLOOD PRESSURE SUPPORT)
PREFILLED_SYRINGE | INTRAVENOUS | Status: AC
  Filled 2024-10-11: qty 10

## 2024-10-11 MED ORDER — PROPOFOL 10 MG/ML IV BOLUS
INTRAVENOUS | Status: DC | PRN
  Administered 2024-10-11: 60 mg via INTRAVENOUS
  Administered 2024-10-11: 100 ug/kg/min via INTRAVENOUS

## 2024-10-11 MED ORDER — NA SULFATE-K SULFATE-MG SULF 17.5-3.13-1.6 GM/177ML PO SOLN
1.0000 | Freq: Once | ORAL | Status: DC
  Filled 2024-10-11: qty 1

## 2024-10-11 NOTE — Progress Notes (Signed)
 Consult placed to start IV for procedure this am. Pt currently finished bowel prep and states it is going to be awhile before she can get out of bathroom. Primary RN notified and to re-consult when pt able to get back in bed.

## 2024-10-11 NOTE — Anesthesia Preprocedure Evaluation (Signed)
 "                                  Anesthesia Evaluation  Patient identified by MRN, date of birth, ID band Patient awake    Reviewed: Allergy & Precautions, NPO status , Patient's Chart, lab work & pertinent test results  Airway Mallampati: IV  TM Distance: <3 FB Neck ROM: full    Dental  (+) Caps   Pulmonary neg pulmonary ROS, sleep apnea , former smoker   Pulmonary exam normal        Cardiovascular Exercise Tolerance: Good hypertension, Pt. on medications negative cardio ROS Normal cardiovascular exam Rhythm:Regular Rate:Normal     Neuro/Psych   Anxiety     CVA, No Residual Symptoms negative neurological ROS  negative psych ROS   GI/Hepatic negative GI ROS, Neg liver ROS,GERD  Medicated,,  Endo/Other  negative endocrine ROS  Class 3 obesity  Renal/GU negative Renal ROS  negative genitourinary   Musculoskeletal   Abdominal   Peds negative pediatric ROS (+)  Hematology  (+) Blood dyscrasia, anemia   Anesthesia Other Findings Past Medical History: No date: Anemia     Comment:  distant past No date: Anxiety No date: Arthritis     Comment:  everywhere - big toes worst No date: Chronic atrial fibrillation (HCC)     Comment:  a. on eliquis ; b. CHADS2VASc at least 2 (age x 1,               female) No date: Depression No date: GERD (gastroesophageal reflux disease)     Comment:  RARE No date: Heart murmur     Comment:  mild - followed by PCP No date: Knee pain No date: Motion sickness     Comment:  back seat of car No date: OSA on CPAP     Comment:  CPAP-4 PSI 03/25/2018: S/P total knee arthroplasty No date: Seasonal allergies     Comment:  takes allergy weekly 09/06/2016: Status post bilateral breast reduction     Comment:  Overview:  08/29/16 No date: Stroke [facility])  Past Surgical History: 08/29/2016: BREAST REDUCTION SURGERY; Bilateral     Comment:  Procedure: BILATERAL MAMMARY REDUCTION  (BREAST)WITH               LIPOSUCTION;   Surgeon: Estefana GORMAN Fritter, DO;                Location: [facility];  Service: Plastics;               Laterality: Bilateral; 05/03/2015: CATARACT EXTRACTION W/PHACO; Left     Comment:  Procedure: CATARACT EXTRACTION PHACO AND INTRAOCULAR               LENS PLACEMENT (IOC);  Surgeon: Dene Etienne, MD;               Location: [facility];  Service: Ophthalmology;                Laterality: Left;  CPAP 10/09/2016: CATARACT EXTRACTION W/PHACO; Right     Comment:  Procedure: CATARACT EXTRACTION PHACO AND INTRAOCULAR               LENS PLACEMENT (IOC);  Surgeon: Dene Etienne, MD;               Location: [facility];  Service: Ophthalmology;  Laterality: Right;  sleep apnea 04/29/2016: CHONDROPLASTY; Right     Comment:  Procedure: CHONDROPLASTY;  Surgeon: Lynwood SHAUNNA Hue, MD;               Location: [facility];  Service: Orthopedics;  Laterality:               Right; 10/24/2018: COLONOSCOPY WITH PROPOFOL ; N/A     Comment:  Procedure: COLONOSCOPY WITH PROPOFOL ;  Surgeon:               Janalyn Keene NOVAK, MD;  Location: [facility];                Service: Endoscopy;  Laterality: N/A; No date: EYE SURGERY 05/30/2022: FLEXIBLE SIGMOIDOSCOPY; N/A     Comment:  Procedure: FLEXIBLE SIGMOIDOSCOPY;  Surgeon: Unk Corinn Skiff, MD;  Location: [facility];  Service:               Gastroenterology;  Laterality: N/A; 10/08/2024: IR ANGIOGRAM VISCERAL SELECTIVE 03/25/2018: KNEE ARTHROPLASTY; Right     Comment:  Procedure: COMPUTER ASSISTED TOTAL KNEE ARTHROPLASTY;                Surgeon: Hue Lynwood SHAUNNA, MD;  Location: [facility];                Service: Orthopedics;  Laterality: Right; 04/29/2016: KNEE ARTHROSCOPY WITH LATERAL MENISECTOMY     Comment:  Procedure: KNEE ARTHROSCOPY WITH LATERAL MENISECTOMY;                Surgeon: Lynwood SHAUNNA Hue, MD;  Location: [facility];                Service: Orthopedics;; 04/29/2016: KNEE  ARTHROSCOPY WITH MEDIAL MENISECTOMY     Comment:  Procedure: KNEE ARTHROSCOPY WITH MEDIAL MENISECTOMY;                Surgeon: Lynwood SHAUNNA Hue, MD;  Location: [facility];                Service: Orthopedics;; 09/2016: REDUCTION MAMMAPLASTY; Bilateral May 03, 2015: RETINAL DETACHMENT SURGERY; Left     Comment:  Dr. Mittie, [facility] No date: TUBAL LIGATION  BMI    Body Mass Index: 32.50 kg/m      Reproductive/Obstetrics negative OB ROS                              Anesthesia Physical Anesthesia Plan  ASA: 3  Anesthesia Plan: General   Post-op Pain Management:    Induction: Intravenous  PONV Risk Score and Plan: Propofol  infusion and TIVA  Airway Management Planned: Natural Airway and Nasal Cannula  Additional Equipment:   Intra-op Plan:   Post-operative Plan:   Informed Consent: I have reviewed the patients History and Physical, chart, labs and discussed the procedure including the risks, benefits and alternatives for the proposed anesthesia with the patient or authorized representative who has indicated his/her understanding and acceptance.     Dental Advisory Given  Plan Discussed with: Anesthesiologist, CRNA and Surgeon  Anesthesia Plan Comments:         Anesthesia Quick Evaluation  "

## 2024-10-11 NOTE — Anesthesia Postprocedure Evaluation (Signed)
"   Anesthesia Post Note  Patient: Crystal Haas  Procedure(s) Performed: COLONOSCOPY  Patient location during evaluation: PACU Anesthesia Type: General Level of consciousness: awake and alert Pain management: satisfactory to patient Vital Signs Assessment: post-procedure vital signs reviewed and stable Respiratory status: spontaneous breathing Cardiovascular status: blood pressure returned to baseline Anesthetic complications: no   No notable events documented.   Last Vitals:  Vitals:   10/11/24 1315 10/11/24 1439  BP: (!) 149/53 (!) 123/53  Pulse: 90 88  Resp: 20 (!) 29  Temp: 36.4 C (!) 36.2 C  SpO2: 100% 100%    Last Pain:  Vitals:   10/11/24 1439  TempSrc: Temporal  PainSc: 0-No pain                 VAN STAVEREN,Tihanna Goodson      "

## 2024-10-11 NOTE — Plan of Care (Signed)
   Problem: Education: Goal: Knowledge of General Education information will improve Description Including pain rating scale, medication(s)/side effects and non-pharmacologic comfort measures Outcome: Progressing   Problem: Health Behavior/Discharge Planning: Goal: Ability to manage health-related needs will improve Outcome: Progressing

## 2024-10-11 NOTE — Op Note (Signed)
 [facility] Patient Name: Crystal Haas Procedure Date: 10/11/2024 2:23 PM MRN: [id number] Account #: [id number] Date of Birth: 1945/11/25 Admit Type: Inpatient Age: 78 Room: [facility] ENDO ROOM 3 Gender: Female Note Status: Finalized Instrument Name: Colon Scope [id number] Procedure:             Colonoscopy Indications:           Hematochezia Providers:             Rogelia Copping MD, MD Referring MD:          Morene Bathe (Referring MD) Medicines:             Propofol  per Anesthesia Complications:         No immediate complications. Procedure:             Pre-Anesthesia Assessment:                        - Prior to the procedure, a History and Physical was                         performed, and patient medications and allergies were                         reviewed. The patient's tolerance of previous                         anesthesia was also reviewed. The risks and benefits                         of the procedure and the sedation options and risks                         were discussed with the patient. All questions were                         answered, and informed consent was obtained. Prior                         Anticoagulants: The patient has taken no anticoagulant                         or antiplatelet agents. ASA Grade Assessment: II - A                         patient with mild systemic disease. After reviewing                         the risks and benefits, the patient was deemed in                         satisfactory condition to undergo the procedure.                        After obtaining informed consent, the colonoscope was                         passed under direct vision. Throughout the procedure,  the patient's blood pressure, pulse, and oxygen                         saturations were monitored continuously. The                         Colonoscope was introduced through the anus with the                          intention of advancing to the cecum. The scope was                         advanced to the transverse colon before the procedure                         was aborted. Medications were given. The colonoscopy                         was performed without difficulty. The patient                         tolerated the procedure well. The quality of the bowel                         preparation was poor. Findings:      The perianal and digital rectal examinations were normal.      Multiple small-mouthed diverticula were found in the sigmoid colon.      Non-bleeding internal hemorrhoids were found during retroflexion. The       hemorrhoids were Grade I (internal hemorrhoids that do not prolapse). Impression:            - Preparation of the colon was poor.                        - Diverticulosis in the sigmoid colon.                        - Non-bleeding internal hemorrhoids.                        - No specimens collected. Recommendation:        - Discharge patient to home.                        - Resume previous diet.                        - Continue present medications. Procedure Code(s):     --- Professional ---                        [phone], 53, Colonoscopy, flexible; diagnostic,                         including collection of specimen(s) by brushing or                         washing, when performed (separate procedure) Diagnosis Code(s):     --- Professional ---  K92.1, Melena (includes Hematochezia) CPT copyright 2022 American Medical Association. All rights reserved. The codes documented in this report are preliminary and upon coder review may  be revised to meet current compliance requirements. Rogelia Copping MD, MD 10/11/2024 2:37:09 PM This report has been signed electronically. Number of Addenda: 0 Note Initiated On: 10/11/2024 2:23 PM Total Procedure Duration: 0 hours 6 minutes 56 seconds  Estimated Blood Loss:  Estimated blood loss: none.      [facility]

## 2024-10-11 NOTE — OR Nursing (Signed)
 Pt finished with colonoscopy, prep not adequate. Pt choosing to prep again tonight and try procedure again 10/12/24. Plan relayed to floor RN. Pt will be kept NPO except for prep, water, clears.

## 2024-10-11 NOTE — Transfer of Care (Signed)
 Immediate Anesthesia Transfer of Care Note  Patient: Crystal Haas  Procedure(s) Performed: COLONOSCOPY  Patient Location: PACU  Anesthesia Type:General  Level of Consciousness: awake  Airway & Oxygen Therapy: Patient Spontanous Breathing  Post-op Assessment: Report given to RN  Post vital signs: stable  Last Vitals:  Vitals Value Taken Time  BP    Temp    Pulse 88 10/11/24 14:39  Resp 29 10/11/24 14:39  SpO2 100 % 10/11/24 14:39  Vitals shown include unfiled device data.  Last Pain:  Vitals:   10/11/24 1315  TempSrc: Temporal  PainSc: 0-No pain      Patients Stated Pain Goal: 0 (10/09/24 1947)  Complications: No notable events documented.

## 2024-10-11 NOTE — Progress Notes (Signed)
 Returned from  10/11/24 1523  Vitals  Temp 97.7 F (36.5 C)  BP (!) 155/61  Pulse Rate 84  Resp 18  Level of Consciousness  Level of Consciousness Alert  MEWS COLOR  MEWS Score Color Green  Oxygen Therapy  SpO2 100 %  O2 Device Room Air  MEWS Score  MEWS Temp 0  MEWS Systolic 0  MEWS Pulse 0  MEWS RR 0  MEWS LOC 0  MEWS Score 0    pacu in stable condition

## 2024-10-11 NOTE — Progress Notes (Signed)
 " PROGRESS NOTE    Crystal Haas  FMW:[id number] DOB: [birth year] DOA: 10/08/2024 PCP: Sampson Ethridge LABOR, MD    Brief Narrative:   78 y.o. year old female with medical history of hypertension, hyperlipidemia, prediabetes, chronic atrial fibrillation on Eliquis , history of stroke, severe diverticulosis with history of GI bleeding presented to the ED with rectal bleeding. Reports bright red blood per rectum with some clots. Also reports ongoing bladder and bowel incontinence for months. Hospital course as below   Assessment & Plan:  Bright red blood per rectum History of diverticular disease Hemoglobin slowly downtrended 12.7 -> 11.0.  Hb stable and not requiring transfusion CT angiogram with active bleed in the sigmoid colon.  Interventional radiology could not find target intervene.  Suspect diverticular bleed GI following, appreciate recs S/p Colonoscopy 12/22 - poor prep, diverticulosis in the sigmoid colon, nonbleeding internal hemorrhoids NPO pm for repeat colonoscopy tomorrow Hold Eliquis  Monitor Hb and transfuse as needed  Chronic atrial fibrillation Patient is in A-fib.  Rate is controlled.   Holding Eliquis  currently given GI bleed as risk significantly outweigh benefits  Hypertension Continue home losartan   Hyperlipidemia/history of CVA Patient was previously on Lipitor 80 mg.  She is not taking statin currently.   Outpatient follow-up   Pancreatic insufficiency takes creon  with meals.  Will continue  MDD Continue home Zoloft   Fecal incontinence Follow-up outpatient  Class II obesity On Mounjaro on Tuesdays at home.  On hold.  Complicating factor in overall care and prognosis.   DVT prophylaxis: SCD  Code Status: Full Family Communication: None at bedside Disposition Plan: Status is: Inpatient Remains inpatient appropriate because: GI bleed, suspect diverticular   Level of care: Progressive  Consultants:  GI  Procedures:  Colonoscopy planned  12/23  Antimicrobials: None   Subjective: Seen and examined. Continues to have some bright red blood per rectum. She also notes fecal incontinence that has been longstanding for the past 2 years ? Plan for repeat coloscopy tomorrow  Objective: Vitals:   10/11/24 0000 10/11/24 0355 10/11/24 0503 10/11/24 0754  BP: (!) 121/54 (!) 108/94  (!) 135/53  Pulse: 82 90  95  Resp: 18 18  20   Temp: 97.9 F (36.6 C) 97.9 F (36.6 C)  97.7 F (36.5 C)  TempSrc:  Oral  Oral  SpO2: 99% 99%  100%  Weight:   88.6 kg   Height:       No intake or output data in the 24 hours ending 10/11/24 0910  Filed Weights   10/09/24 0506 10/10/24 0400 10/11/24 0503  Weight: 88.1 kg 87.7 kg 88.6 kg    Examination:   Constitutional: In no distress.  Cardiovascular: Normal rate, regular rhythm. No lower extremity edema  Pulmonary: Non labored breathing on room air, no wheezing or rales.   Abdominal: Soft. Non distended and non tender Musculoskeletal: Normal range of motion.     Neurological: Alert and oriented to person, place, and time. Non focal  Skin: Skin is warm and dry.     Data Reviewed: I have personally reviewed following labs and imaging studies  CBC: Recent Labs  Lab 10/08/24 1030 10/08/24 2037 10/09/24 0235 10/10/24 0355 10/10/24 1513 10/11/24 0402  WBC 7.2  --  9.1 11.4* 11.2* 11.6*  NEUTROABS  --   --   --  7.2  --   --   HGB 12.7 12.4 12.1 10.1* 11.1* 11.0*  HCT 39.2 37.5 36.8 30.6* 33.1* 32.9*  MCV 93.6  --  92.5 93.3 91.4  92.2  PLT 182  --  193 170 214 189   Basic Metabolic Panel: Recent Labs  Lab 10/08/24 1030 10/09/24 0235 10/11/24 0402  NA 139 140 140  K 4.8 4.3 4.2  CL 105 104 105  CO2 26 22 25   GLUCOSE 95 125* 87  BUN 27* 27* 33*  CREATININE 0.76 0.88 0.81  CALCIUM  9.0 8.8* 8.5*  MG 2.0  --   --    GFR: Estimated Creatinine Clearance: 63.9 mL/min (by C-G formula based on SCr of 0.81 mg/dL). Liver Function Tests: Recent Labs  Lab 10/08/24 1030   AST 18  ALT 14  ALKPHOS 53  BILITOT 0.4  PROT 6.5  ALBUMIN 4.1   No results for input(s): LIPASE, AMYLASE in the last 168 hours. No results for input(s): AMMONIA in the last 168 hours. Coagulation Profile: No results for input(s): INR, PROTIME in the last 168 hours. Cardiac Enzymes: No results for input(s): CKTOTAL, CKMB, CKMBINDEX, TROPONINI in the last 168 hours. BNP (last 3 results) No results for input(s): PROBNP in the last 8760 hours. HbA1C: No results for input(s): HGBA1C in the last 72 hours. CBG: Recent Labs  Lab 10/09/24 0743 10/10/24 0745 10/11/24 0757  GLUCAP 118* 77 79   Lipid Profile: No results for input(s): CHOL, HDL, LDLCALC, TRIG, CHOLHDL, LDLDIRECT in the last 72 hours. Thyroid  Function Tests: No results for input(s): TSH, T4TOTAL, FREET4, T3FREE, THYROIDAB in the last 72 hours. Anemia Panel: No results for input(s): VITAMINB12, FOLATE, FERRITIN, TIBC, IRON, RETICCTPCT in the last 72 hours. Sepsis Labs: No results for input(s): PROCALCITON, LATICACIDVEN in the last 168 hours.  No results found for this or any previous visit (from the past 240 hours).       Radiology Studies: No results found.       Scheduled Meds:  azelastine   1 spray Each Nare Daily   cholecalciferol   1,000 Units Oral Daily   lipase/protease/amylase  108,000 Units Oral TID AC   losartan   12.5 mg Oral Daily   olopatadine   1 drop Right Eye BID   pantoprazole  (PROTONIX ) IV  40 mg Intravenous Q12H   sertraline   50 mg Oral Daily   sodium chloride  flush  3 mL Intravenous Q12H   Continuous Infusions:  sodium chloride      I personally spent a total of 36 minutes in the care of the patient today including preparing to see the patient, counseling and educating, placing orders, referring and communicating with other health care professionals, documenting clinical information in the EHR, communicating results, and  coordinating care.    LOS: 3 days    Laree Lock, MD [facility]   If 7PM-7AM, please contact night-coverage  10/11/2024, 9:10 AM   "

## 2024-10-12 ENCOUNTER — Other Ambulatory Visit: Payer: Self-pay

## 2024-10-12 ENCOUNTER — Encounter: Admission: EM | Disposition: A | Payer: Self-pay | Source: Home / Self Care

## 2024-10-12 ENCOUNTER — Inpatient Hospital Stay

## 2024-10-12 ENCOUNTER — Encounter: Payer: Self-pay | Admitting: Internal Medicine

## 2024-10-12 DIAGNOSIS — K5730 Diverticulosis of large intestine without perforation or abscess without bleeding: Secondary | ICD-10-CM

## 2024-10-12 DIAGNOSIS — R32 Unspecified urinary incontinence: Secondary | ICD-10-CM

## 2024-10-12 DIAGNOSIS — K640 First degree hemorrhoids: Secondary | ICD-10-CM | POA: Diagnosis not present

## 2024-10-12 DIAGNOSIS — N3946 Mixed incontinence: Secondary | ICD-10-CM | POA: Insufficient documentation

## 2024-10-12 DIAGNOSIS — K921 Melena: Secondary | ICD-10-CM

## 2024-10-12 DIAGNOSIS — K635 Polyp of colon: Secondary | ICD-10-CM

## 2024-10-12 DIAGNOSIS — K5791 Diverticulosis of intestine, part unspecified, without perforation or abscess with bleeding: Secondary | ICD-10-CM | POA: Diagnosis not present

## 2024-10-12 HISTORY — DX: Morbid (severe) obesity due to excess calories: E66.01

## 2024-10-12 HISTORY — DX: Unspecified urinary incontinence: R32

## 2024-10-12 HISTORY — PX: POLYPECTOMY: SHX149

## 2024-10-12 HISTORY — PX: COLONOSCOPY: SHX5424

## 2024-10-12 HISTORY — DX: Mixed incontinence: N39.46

## 2024-10-12 LAB — BASIC METABOLIC PANEL WITH GFR
Anion gap: 10 (ref 5–15)
BUN: 16 mg/dL (ref 8–23)
CO2: 26 mmol/L (ref 22–32)
Calcium: 8.2 mg/dL — ABNORMAL LOW (ref 8.9–10.3)
Chloride: 108 mmol/L (ref 98–111)
Creatinine, Ser: 0.74 mg/dL (ref 0.44–1.00)
GFR, Estimated: >60 mL/min
Glucose, Bld: 82 mg/dL (ref 70–99)
Potassium: 3.5 mmol/L (ref 3.5–5.1)
Sodium: 143 mmol/L (ref 135–145)

## 2024-10-12 LAB — CBC
HCT: 31.3 % — ABNORMAL LOW (ref 36.0–46.0)
Hemoglobin: 10.3 g/dL — ABNORMAL LOW (ref 12.0–15.0)
MCH: 30.9 pg (ref 26.0–34.0)
MCHC: 32.9 g/dL (ref 30.0–36.0)
MCV: 94 fL (ref 80.0–100.0)
Platelets: 192 K/uL (ref 150–400)
RBC: 3.33 MIL/uL — ABNORMAL LOW (ref 3.87–5.11)
RDW: 14.3 % (ref 11.5–15.5)
WBC: 10.9 K/uL — ABNORMAL HIGH (ref 4.0–10.5)
nRBC: 0 % (ref 0.0–0.2)

## 2024-10-12 LAB — GLUCOSE, CAPILLARY: Glucose-Capillary: 98 mg/dL (ref 70–99)

## 2024-10-12 LAB — HEMOGLOBIN: Hemoglobin: 10.1 g/dL — ABNORMAL LOW (ref 12.0–15.0)

## 2024-10-12 SURGERY — COLONOSCOPY
Anesthesia: General

## 2024-10-12 MED ORDER — PROPOFOL 10 MG/ML IV BOLUS
INTRAVENOUS | Status: DC | PRN
  Administered 2024-10-12: 120 ug/kg/min via INTRAVENOUS
  Administered 2024-10-12: 100 mg via INTRAVENOUS

## 2024-10-12 MED ORDER — LACTATED RINGERS IV SOLN: INTRAVENOUS | Status: DC

## 2024-10-12 MED ORDER — APIXABAN 5 MG PO TABS: 5.0000 mg | ORAL_TABLET | Freq: Two times a day (BID) | ORAL | Status: AC

## 2024-10-12 MED ORDER — POTASSIUM CHLORIDE CRYS ER 20 MEQ PO TBCR
20.0000 meq | EXTENDED_RELEASE_TABLET | Freq: Once | ORAL | Status: AC
  Administered 2024-10-12: 20 meq via ORAL
  Filled 2024-10-12: qty 1

## 2024-10-12 MED ORDER — LOSARTAN POTASSIUM 25 MG PO TABS: 12.5000 mg | ORAL_TABLET | Freq: Every day | ORAL | Status: AC

## 2024-10-12 NOTE — Progress Notes (Signed)
 The patient had a colonoscopy with only diverticulosis and 1 small polyp removed.  The patient's likely side of bleeding was the diverticulosis.  If the patient should have any further bleeding the patient should go through a CT angiography and would need embolization by either vascular surgery or interventional radiology.  Nothing further to do from a GI point of view.  I will sign off.  Please call if any further GI concerns or questions.  We would like to thank you for the opportunity to participate in the care of Crystal Haas.

## 2024-10-12 NOTE — Discharge Summary (Signed)
 " Physician Discharge Summary   Patient: Crystal Haas MRN: [id number] DOB: 1946-08-25  Admit date:     10/08/2024  Discharge date: 10/12/2024  Discharge Physician: Laree Lock   PCP: Sampson Ethridge LABOR, MD   Recommendations at discharge:   Follow up with PCP in 1 week - Repeat CBC (Hb, wbc), BMP  Follow up with GI outpatient  Discharge Diagnoses: Principal Problem:   GI bleeding Active Problems:   Depression   Chronic atrial fibrillation (HCC)   Hypertension   Diverticulosis of large intestine without diverticulitis   Prediabetes   HLD (hyperlipidemia)   History of stroke   Pancreatic insufficiency   Blood in stool   Polyp of ascending colon   Hospital Course: 78 y.o. year old female with medical history of hypertension, hyperlipidemia, prediabetes, chronic atrial fibrillation on Eliquis , history of stroke, severe diverticulosis with history of GI bleeding presented to the ED with rectal bleeding. Reports bright red blood per rectum with some clots. Also reports ongoing bladder and bowel incontinence for months. Hospital course as below    Bright red blood per rectum - resolved. Likely diverticular bleed History of diverticular disease - Hemoglobin slowly downtrended 12.7 -> 10.3, stable - did not require blood transfusions. No ongoing bleeding ~ 48 hrs - On adm, CT angiogram with active bleed in the sigmoid colon.  Interventional radiology could not find target intervene - S/p Colonoscopy 12/22 - poor prep, diverticulosis in the sigmoid colon, nonbleeding internal hemorrhoids - Repeat colonoscopy 12/23 - one 2mm polyp in ascending colon, resected. Diverticulosis and internal hemorrhoids - No further ongoing bleed, patient stated would like to go home - Advised to return to ER if patient has recurrence of active bleeding  Leukocytosis - likely reactive - Downtrending, Follow up outpatient  Chronic atrial fibrillation - Resume Eliquis  in 2 days if no further  bleeding   Hypertension - Continue home losartan  12.5mg    Hyperlipidemia/history of CVA   Pancreatic insufficiency takes creon  with meals   MDD   Fecal incontinence Follow-up outpatient   Class II obesity   Consultants: Gastroenterology Procedures performed: Colonoscopy  Disposition: Home Diet recommendation:  Discharge Diet Orders (From admission, onward)     Start     Ordered   10/12/24 0000  Diet - low sodium heart healthy        10/12/24 1343            DISCHARGE MEDICATION: Allergies as of 10/12/2024       Reactions   Apple Anaphylaxis   Throat swells but subsides with po benadry   Ivp Dye [iodinated Contrast Media] Shortness Of Breath   Also swelling.  Topical betadine  is OK.   Tape Other (See Comments)   Most tapes case raw skin.  Paper tape is OK.   Wound Dressing Adhesive Itching   Most tapes case raw skin.  Paper tape is OK.        Medication List     STOP taking these medications    HYDROcodone -acetaminophen  5-325 MG tablet Commonly known as: NORCO/VICODIN       TAKE these medications    apixaban  5 MG Tabs tablet Commonly known as: Eliquis  Take 1 tablet (5 mg total) by mouth 2 (two) times daily. Start taking on: October 15, 2024 What changed: These instructions start on October 15, 2024. If you are unsure what to do until then, ask your doctor or other care provider.   Artificial Tears 0.1-0.3 % Soln Generic drug: Dextran 70-Hypromellose Place 1  drop into both eyes daily as needed.   atorvastatin  40 MG tablet Commonly known as: LIPITOR Take 40 mg by mouth daily.   Azelastine  HCl 137 MCG/SPRAY Soln Place 1 spray into both nostrils daily.   B-complex with vitamin C tablet Take 1 tablet by mouth daily.   baclofen 10 MG tablet Commonly known as: LIORESAL Take 10 mg by mouth daily.   cholecalciferol  25 MCG (1000 UNIT) tablet Commonly known as: VITAMIN D3 Take by mouth.   clobetasol cream 0.05 % Commonly known as:  TEMOVATE Apply topically.   diclofenac Sodium 1 % Gel Commonly known as: VOLTAREN Apply topically.   fexofenadine 180 MG tablet Commonly known as: ALLEGRA Take by mouth.   GLUCOSAMINE-CHONDROITIN DS PO Take 3,000 mg by mouth daily.   lipase/protease/amylase 63999 UNITS Cpep capsule Commonly known as: Creon  Take 3 capsules with the first bite of each meal and 2 capsule with the first bite of each snack   losartan  25 MG tablet Commonly known as: COZAAR  Take 0.5 tablets (12.5 mg total) by mouth daily.   Magnesium  250 MG Tabs Take 250 mg by mouth daily.   Mounjaro 7.5 MG/0.5ML Pen Generic drug: tirzepatide Inject 7.5 mg into the skin once a week.   multivitamin with minerals Tabs tablet Take 1 tablet by mouth daily. One-A-Day Active 65+   nystatin  cream Commonly known as: MYCOSTATIN  Apply 1 application topically 2 (two) times daily.   Olopatadine  HCl 0.2 % Soln Place 1 drop into both eyes daily.   sertraline  50 MG tablet Commonly known as: ZOLOFT  TAKE 1 TABLET BY MOUTH EVERY DAY   triamcinolone  cream 0.1 % Commonly known as: KENALOG  Apply 1 application topically 2 (two) times daily.        Discharge Exam: Filed Weights   10/10/24 0400 10/11/24 0503 10/12/24 0507  Weight: 87.7 kg 88.6 kg 87 kg   Constitutional: In no distress.  Cardiovascular: Normal rate, regular rhythm. No lower extremity edema  Pulmonary: Non labored breathing on room air, no wheezing or rales.   Abdominal: Soft. Non distended and non tender Musculoskeletal: Normal range of motion.     Neurological: Alert and oriented to person, place, and time. Non focal  Skin: Skin is warm and dry  Condition at discharge: fair  The results of significant diagnostics from this hospitalization (including imaging, microbiology, ancillary and laboratory) are listed below for reference.   Imaging Studies: IR Angiogram Visceral Selective Result Date: 10/09/2024 EXAM: Inferior Mesenteric arteriogram  10/08/2024 08:02:00 PM CLINICAL HISTORY: Acute GI bleed, localized to sigmoid colon on recent CTA. COMPARISON: None available TECHNIQUE: Moderate sedation was provided to the patient throughout the procedure. I ordered the medications used, and was present face-to-face with the patient from the time of the first dose through the end of the moderate sedation. An independent trained observer was present throughout the moderate sedation, and was solely responsible for monitoring the patient's moderate sedation. Moderate sedation time: 5 minutes. 1 mg IV Versed  was administered with 50 mcg IV fentanyl . Informed consent for the procedure including risks, benefits and alternatives was obtained and time-out was performed prior to the procedure. The site was prepared and draped using maximal sterile barrier technique including cutaneous antisepsis. Local anesthesia was administered. All aspects of maximum sterile barrier technique were used including washing hands with conventional soap and water or with alcohol based hand rub (ABHR), skin preparation, cap, mask, sterile gown, sterile gloves and sterile full body draped. Sterile ultrasound techniques were followed including sterile gel and  sterile probe covers. Ultrasound was utilized to evaluate for potential sites of access and determine patency. Real time ultrasound was used to visualize needle entry into the vessel, and a permanent image was stored. Arterial access was obtained via the right common femoral artery using a micropuncture set with a 21 gauge needle. A 5 French sheath was placed. The vessel was patent. The abdominal aorta below the level of the renal arteries was catheterized. The inferior mesenteric arterial system was catheterized using a 5 French Sos catheter, and coaxial Renegade microcatheter with Fathom guidewire. Selective mesenteric angiography of the inferior mesenteric artery was performed. Findings include no active extravasation, a-v  malformation, or other pathologic findings. Superselective mesenteric angiography was performed of 3 separate 2nd order branches of the inferior mesenteric artery anatomically corresponding to the region of concern on CTA. Findings include no active extravasation, AVM, or other pathologic findings. Angiography of the interrogated vessels demonstrates no continued active GI bleed in the region of concern. The access site was closed with a Celt closure device under real time ultrasound guidance. The patient tolerated the procedure well without immediate postprocedural complications. No immediate complications. No intraprocedure events. Estimated blood loss: Less than 5 mL. Fluoroscopy dose area product: 116.1 Gy*cm\S\2 150 mL iohexol  (OMNIPAQUE ) 300 MG/ML solution was administered. IMPRESSION: 1. No evidence of active GI bleed in the region of concern. Electronically signed by: Katheleen Faes MD 10/09/2024 09:08 AM EST RP Workstation: [id number]   CT ANGIO GI BLEED Result Date: 10/08/2024 EXAM: CTA ABDOMEN AND PELVIS WITH CONTRAST 10/08/2024 04:10:14 PM TECHNIQUE: CTA images of the abdomen and pelvis with intravenous contrast. Three-dimensional MIP/volume rendered formations were performed. Automated exposure control, iterative reconstruction, and/or weight based adjustment of the mA/kV was utilized to reduce the radiation dose to as low as reasonably achievable. COMPARISON: CT abdomen and pelvis 03/01/2024. CLINICAL HISTORY: GI bleed on thinners, eval for source. Maroon stool/blood. Elevated BUN. FINDINGS: VASCULATURE: GI BLEED: There is a small area of acute hemorrhage within the mid sigmoid colon on image 7/135. AORTA: There is mild calcified atherosclerotic disease throughout the aorta. No acute finding. No abdominal aortic aneurysm. No dissection. CELIAC TRUNK: No acute finding. No occlusion or significant stenosis. SUPERIOR MESENTERIC ARTERY: No acute finding. No occlusion or significant stenosis. INFERIOR  MESENTERIC ARTERY: No acute finding. No occlusion or significant stenosis. RENAL ARTERIES: No acute finding. No occlusion or significant stenosis. ILIAC ARTERIES: No acute finding. No occlusion or significant stenosis. ABDOMEN/PELVIS: LOWER CHEST: Visualized portion of the lower chest demonstrates no acute abnormality. LIVER: The liver is unremarkable. GALLBLADDER AND BILE DUCTS: Gallbladder is unremarkable. No biliary ductal dilatation. SPLEEN: The spleen is unremarkable. PANCREAS: The pancreas is unremarkable. ADRENAL GLANDS: Bilateral adrenal glands demonstrate no acute abnormality. KIDNEYS, URETERS AND BLADDER: No stones in the kidneys or ureters. No hydronephrosis. No perinephric or periureteral stranding. Urinary bladder is unremarkable. GI AND BOWEL: Stomach and duodenal sweep demonstrate no acute abnormality. There is a small area of acute hemorrhage within the mid sigmoid colon on image 7/135. There is no inflammation at this level. There is sigmoid colon diverticulosis. The appendix appears normal. There is no bowel obstruction. No abnormal bowel wall thickening or distension. REPRODUCTIVE: Reproductive organs are unremarkable. PERITONEUM AND RETROPERITONEUM: No ascites or free air. LYMPH NODES: No lymphadenopathy. BONES AND SOFT TISSUES: No acute abnormality of the bones. No acute soft tissue abnormality. IMPRESSION: 1. Small area of acute hemorrhage within the mid sigmoid colon without associated inflammation. 2. Sigmoid colon diverticulosis. 3. Mild calcified atherosclerotic disease  of the aorta. Electronically signed by: Greig Pique MD 10/08/2024 05:00 PM EST RP Workstation: [id number]    Microbiology: Results for orders placed or performed in visit on 03/28/22  Urine Culture     Status: None   Collection Time: 03/28/22 10:28 AM   Specimen: Blood  Result Value Ref Range Status   MICRO NUMBER: [id number]  Final   SPECIMEN QUALITY: Adequate  Final   Sample Source NOT GIVEN  Final   STATUS:  FINAL  Final   Result: No Growth  Final    Labs: CBC: Recent Labs  Lab 10/09/24 0235 10/10/24 0355 10/10/24 1513 10/11/24 0402 10/12/24 0331 10/12/24 1238  WBC 9.1 11.4* 11.2* 11.6* 10.9*  --   NEUTROABS  --  7.2  --   --   --   --   HGB 12.1 10.1* 11.1* 11.0* 10.3* 10.1*  HCT 36.8 30.6* 33.1* 32.9* 31.3*  --   MCV 92.5 93.3 91.4 92.2 94.0  --   PLT 193 170 214 189 192  --    Basic Metabolic Panel: Recent Labs  Lab 10/08/24 1030 10/09/24 0235 10/11/24 0402 10/12/24 0331  NA 139 140 140 143  K 4.8 4.3 4.2 3.5  CL 105 104 105 108  CO2 26 22 25 26   GLUCOSE 95 125* 87 82  BUN 27* 27* 33* 16  CREATININE 0.76 0.88 0.81 0.74  CALCIUM  9.0 8.8* 8.5* 8.2*  MG 2.0  --   --   --    Liver Function Tests: Recent Labs  Lab 10/08/24 1030  AST 18  ALT 14  ALKPHOS 53  BILITOT 0.4  PROT 6.5  ALBUMIN 4.1   CBG: Recent Labs  Lab 10/09/24 0743 10/10/24 0745 10/11/24 0757 10/12/24 0847  GLUCAP 118* 77 79 98    Discharge time spent: greater than 30 minutes.  Signed: Laree Lock, MD [facility] 10/12/2024 "

## 2024-10-12 NOTE — Plan of Care (Signed)

## 2024-10-12 NOTE — Op Note (Signed)
 [facility] Patient Name: Crystal Haas Procedure Date: 10/12/2024 9:28 AM MRN: [id number] Account #: [id number] Date of Birth: 09-08-1946 Admit Type: Inpatient Age: 78 Room: [facility] ENDO ROOM 4 Gender: Female Note Status: Finalized Instrument Name: Colon Scope [id number] Procedure:             Colonoscopy Indications:           Hematochezia Providers:             Rogelia Copping MD, MD Referring MD:          Leita Adie, MD (Referring MD) Medicines:             Propofol  per Anesthesia Complications:         No immediate complications. Procedure:             Pre-Anesthesia Assessment:                        - Prior to the procedure, a History and Physical was                         performed, and patient medications and allergies were                         reviewed. The patient's tolerance of previous                         anesthesia was also reviewed. The risks and benefits                         of the procedure and the sedation options and risks                         were discussed with the patient. All questions were                         answered, and informed consent was obtained. Prior                         Anticoagulants: The patient has taken no anticoagulant                         or antiplatelet agents. ASA Grade Assessment: II - A                         patient with mild systemic disease. After reviewing                         the risks and benefits, the patient was deemed in                         satisfactory condition to undergo the procedure.                        After obtaining informed consent, the colonoscope was                         passed under direct vision. Throughout the procedure,  the patient's blood pressure, pulse, and oxygen                         saturations were monitored continuously. The                         Colonoscope was introduced through the anus and                          advanced to the the cecum, identified by appendiceal                         orifice and ileocecal valve. The colonoscopy was                         performed without difficulty. The patient tolerated                         the procedure well. The quality of the bowel                         preparation was good. Findings:      The perianal and digital rectal examinations were normal.      A 2 mm polyp was found in the ascending colon. The polyp was sessile.       The polyp was removed with a cold biopsy forceps. Resection and       retrieval were complete.      Multiple small-mouthed diverticula were found in the sigmoid colon and       descending colon.      Non-bleeding internal hemorrhoids were found during retroflexion. The       hemorrhoids were Grade I (internal hemorrhoids that do not prolapse). Impression:            - One 2 mm polyp in the ascending colon, removed with                         a cold biopsy forceps. Resected and retrieved.                        - Diverticulosis in the sigmoid colon and in the                         descending colon.                        - Non-bleeding internal hemorrhoids. Recommendation:        - Discharge patient to home.                        - Resume previous diet.                        - Continue present medications.                        - Await pathology results.                        - Repeat colonoscopy is not recommended for  surveillance. Procedure Code(s):     --- Professional ---                        [phone], Colonoscopy, flexible; with biopsy, single or                         multiple Diagnosis Code(s):     --- Professional ---                        K92.1, Melena (includes Hematochezia)                        D12.2, Benign neoplasm of ascending colon CPT copyright 2022 [employer]. All rights reserved. The codes documented in this report are preliminary and upon coder  review may  be revised to meet current compliance requirements. Rogelia Copping MD, MD 10/12/2024 10:01:25 AM This report has been signed electronically. Number of Addenda: 0 Note Initiated On: 10/12/2024 9:28 AM Scope Withdrawal Time: 0 hours 6 minutes 53 seconds  Total Procedure Duration: 0 hours 15 minutes 34 seconds  Estimated Blood Loss:  Estimated blood loss: none.      [facility]

## 2024-10-12 NOTE — Anesthesia Preprocedure Evaluation (Signed)
 "                                  Anesthesia Evaluation  Patient identified by MRN, date of birth, ID band Patient awake    Reviewed: Allergy & Precautions, NPO status , Patient's Chart, lab work & pertinent test results  Airway Mallampati: IV  TM Distance: <3 FB Neck ROM: full    Dental  (+) Teeth Intact   Pulmonary neg pulmonary ROS, sleep apnea , former smoker   Pulmonary exam normal        Cardiovascular Exercise Tolerance: Good hypertension, negative cardio ROS Normal cardiovascular exam Rhythm:Regular Rate:Normal     Neuro/Psych   Anxiety     TIACVA negative neurological ROS  negative psych ROS   GI/Hepatic negative GI ROS, Neg liver ROS,GERD  ,,  Endo/Other  negative endocrine ROS    Renal/GU negative Renal ROS  negative genitourinary   Musculoskeletal  (+) Arthritis ,    Abdominal Normal abdominal exam  (+)   Peds negative pediatric ROS (+)  Hematology negative hematology ROS (+) Blood dyscrasia, anemia   Anesthesia Other Findings Past Medical History: No date: Anemia     Comment:  distant past No date: Anxiety No date: Arthritis     Comment:  everywhere - big toes worst No date: Chronic atrial fibrillation (HCC)     Comment:  a. on eliquis ; b. CHADS2VASc at least 2 (age x 1,               female) No date: Depression No date: GERD (gastroesophageal reflux disease)     Comment:  RARE No date: Heart murmur     Comment:  mild - followed by PCP No date: Knee pain No date: Motion sickness     Comment:  back seat of car No date: OSA on CPAP     Comment:  CPAP-4 PSI 03/25/2018: S/P total knee arthroplasty No date: Seasonal allergies     Comment:  takes allergy weekly 09/06/2016: Status post bilateral breast reduction     Comment:  Overview:  08/29/16 No date: Stroke [facility])  Past Surgical History: 08/29/2016: BREAST REDUCTION SURGERY; Bilateral     Comment:  Procedure: BILATERAL MAMMARY REDUCTION  (BREAST)WITH                LIPOSUCTION;  Surgeon: Estefana GORMAN Fritter, DO;                Location: [facility];  Service: Plastics;               Laterality: Bilateral; 05/03/2015: CATARACT EXTRACTION W/PHACO; Left     Comment:  Procedure: CATARACT EXTRACTION PHACO AND INTRAOCULAR               LENS PLACEMENT (IOC);  Surgeon: Dene Etienne, MD;               Location: [facility];  Service: Ophthalmology;                Laterality: Left;  CPAP 10/09/2016: CATARACT EXTRACTION W/PHACO; Right     Comment:  Procedure: CATARACT EXTRACTION PHACO AND INTRAOCULAR               LENS PLACEMENT (IOC);  Surgeon: Dene Etienne, MD;               Location: [facility];  Service: Ophthalmology;  Laterality: Right;  sleep apnea 04/29/2016: CHONDROPLASTY; Right     Comment:  Procedure: CHONDROPLASTY;  Surgeon: Lynwood SHAUNNA Hue, MD;               Location: [facility];  Service: Orthopedics;  Laterality:               Right; 10/24/2018: COLONOSCOPY WITH PROPOFOL ; N/A     Comment:  Procedure: COLONOSCOPY WITH PROPOFOL ;  Surgeon:               Janalyn Keene NOVAK, MD;  Location: [facility];                Service: Endoscopy;  Laterality: N/A; No date: EYE SURGERY 05/30/2022: FLEXIBLE SIGMOIDOSCOPY; N/A     Comment:  Procedure: FLEXIBLE SIGMOIDOSCOPY;  Surgeon: Unk Corinn Skiff, MD;  Location: [facility];  Service:               Gastroenterology;  Laterality: N/A; 10/08/2024: IR ANGIOGRAM VISCERAL SELECTIVE 03/25/2018: KNEE ARTHROPLASTY; Right     Comment:  Procedure: COMPUTER ASSISTED TOTAL KNEE ARTHROPLASTY;                Surgeon: Hue Lynwood SHAUNNA, MD;  Location: [facility];                Service: Orthopedics;  Laterality: Right; 04/29/2016: KNEE ARTHROSCOPY WITH LATERAL MENISECTOMY     Comment:  Procedure: KNEE ARTHROSCOPY WITH LATERAL MENISECTOMY;                Surgeon: Lynwood SHAUNNA Hue, MD;  Location: [facility];                Service:  Orthopedics;; 04/29/2016: KNEE ARTHROSCOPY WITH MEDIAL MENISECTOMY     Comment:  Procedure: KNEE ARTHROSCOPY WITH MEDIAL MENISECTOMY;                Surgeon: Lynwood SHAUNNA Hue, MD;  Location: [facility];                Service: Orthopedics;; 09/2016: REDUCTION MAMMAPLASTY; Bilateral May 03, 2015: RETINAL DETACHMENT SURGERY; Left     Comment:  Dr. Mittie, [facility] No date: TUBAL LIGATION  BMI    Body Mass Index: 31.90 kg/m      Reproductive/Obstetrics negative OB ROS                              Anesthesia Physical Anesthesia Plan  ASA: 3  Anesthesia Plan: General   Post-op Pain Management:    Induction: Intravenous  PONV Risk Score and Plan: Propofol  infusion and TIVA  Airway Management Planned: Natural Airway and Nasal Cannula  Additional Equipment:   Intra-op Plan:   Post-operative Plan:   Informed Consent: I have reviewed the patients History and Physical, chart, labs and discussed the procedure including the risks, benefits and alternatives for the proposed anesthesia with the patient or authorized representative who has indicated his/her understanding and acceptance.     Dental Advisory Given  Plan Discussed with: CRNA  Anesthesia Plan Comments:         Anesthesia Quick Evaluation  "

## 2024-10-12 NOTE — TOC CM/SW Note (Signed)
 Transition of Care [facility]) - Inpatient Brief Assessment   Patient Details  Name: Crystal Haas MRN: [id number] Date of Birth: 03-May-1946  Transition of Care [facility]) CM/SW Contact:    Shasta DELENA Daring, RN Phone Number: 10/12/2024, 10:26 AM   Clinical Narrative:  Transition of Care (TOC) Screening Note   Patient Details  Name: Crystal Haas Date of Birth: [birth year]   Transition of Care [facility]) CM/SW Contact:    Shasta DELENA Daring, RN Phone Number: 10/12/2024, 10:26 AM    Transition of Care Department [facility]) has reviewed patient and no TOC needs have been identified at this time. We will continue to monitor patient advancement through interdisciplinary progression rounds. If new patient transition needs arise, please place a TOC consult.     Transition of Care Asessment: Insurance and Status: Insurance coverage has been reviewed Patient has primary care physician: Yes       Social Drivers of Health Review: SDOH reviewed no interventions necessary Readmission risk has been reviewed: Yes Transition of care needs: no transition of care needs at this time

## 2024-10-12 NOTE — Transfer of Care (Signed)
 Immediate Anesthesia Transfer of Care Note  Patient: Crystal Haas  Procedure(s) Performed: COLONOSCOPY POLYPECTOMY, INTESTINE  Patient Location: PACU and Endoscopy Unit  Anesthesia Type:General  Level of Consciousness: sedated  Airway & Oxygen Therapy: Patient Spontanous Breathing  Post-op Assessment: Report given to RN  Post vital signs: Reviewed and stable  Last Vitals:  Vitals Value Taken Time  BP 115/45 10/12/24 10:04  Temp    Pulse 64 10/12/24 10:04  Resp 16 10/12/24 10:04  SpO2 99 % 10/12/24 10:04  Vitals shown include unfiled device data.  Last Pain:  Vitals:   10/12/24 1003  TempSrc:   PainSc: Asleep      Patients Stated Pain Goal: 0 (10/11/24 2000)  Complications: No notable events documented.

## 2024-10-12 NOTE — Anesthesia Postprocedure Evaluation (Signed)
"   Anesthesia Post Note  Patient: Crystal Haas  Procedure(s) Performed: COLONOSCOPY POLYPECTOMY, INTESTINE  Patient location during evaluation: PACU Anesthesia Type: General Level of consciousness: awake Vital Signs Assessment: post-procedure vital signs reviewed and stable Cardiovascular status: blood pressure returned to baseline Anesthetic complications: no   No notable events documented.   Last Vitals:  Vitals:   10/12/24 1023 10/12/24 1214  BP: (!) 140/51 (!) 157/59  Pulse:  78  Resp:  17  Temp:  36.7 C  SpO2:  98%    Last Pain:  Vitals:   10/12/24 1023  TempSrc:   PainSc: 0-No pain                 VAN STAVEREN,Asmar Brozek      "

## 2024-10-13 LAB — SURGICAL PATHOLOGY

## 2024-10-15 ENCOUNTER — Ambulatory Visit: Payer: Self-pay | Admitting: Gastroenterology

## 2024-10-18 NOTE — Progress Notes (Unsigned)
 "   10/20/2024 Crystal Haas [id number] 08/27/1946  Gastroenterology Office Note     Primary Care Physician:  Sampson Ethridge LABOR, MD  Primary GI Provider: Jinny Carmine, MD    Chief Complaint   Chief Complaint  Patient presents with   Follow-up    Pancreatic insuff- see ER notes- recent colonoscopy- stools have been solid the last 3 days-     History of Present Illness   Crystal Haas is a 78 y.o. female with PMHX of Afib on Eliquis , EPI, chronic diarrhea, and GI bleed presenting today for hospital follow up.   Discussed the use of AI scribe software for clinical note transcription with the patient, who gave verbal consent to proceed.  Patient recently hospitalized for lower GI bleed.  Patient underwent 2 colonoscopies due to poor prep revealing diverticulosis and a polyp, which was removed.  Bleeding likely due to diverticula.  She had no associated abdominal pain and no further bleeding has occurred since discharge.  Patient reports she continued to have diarrhea following hospital discharge but now for the last couple of days she has had a formed stool for the first time in several years.   Patient was seen in this office in November 2024 with improved diarrhea, but she states that since Christmas of last year diarrhea and fecal incontinence returned.  She has frequent episodes of stool leakage, particularly during urination. She does not perceive the leakage until noticing soiling, resulting in significant embarrassment and avoidance of social activities. She uses pads and must frequently locate restrooms when outside the home.    Patient reports patient states she has utilized pelvic floor physical therapy in the past but did not seem to improve her symptoms. Use of an Elitone device initially improved bladder control but subsequently caused severe diarrhea and bed soiling, leading her to discontinue it.  She takes Creon  for exocrine pancreatic insufficiency (three capsules with  meals, two with snacks), which provides some benefit for diarrhea, though loose stools persist. Mounjaro was initiated in late 2024 and briefly improved diarrhea, but symptoms returned shortly after Christmas. Diet consists of high-protein, high-fiber foods, including salads and high-fiber cereal, and she maintains adequate hydration. She denies constipation and reports that Mounjaro has not caused constipation.  Patient reports she has appointment on Friday with PCP at [facility] and will have labs drawn to recheck hemoglobin.  10/12/2024 colonoscopy -One 2 mm polyp in the ascending colon, removed with a cold biopsy forceps.  Resected and retrieved. - Diverticulosis in the sigmoid colon and in the descending colon. - Nonbleeding internal hemorrhoids. - Repeat colonoscopy is not recommended for surveillance.  Patient last seen by Ellouise Console, PA on 09/16/2023 for exocrine pancreatic insufficiency and chronic diarrhea.  She has been taking Creon  36,000 lipase units 3 with each meal and 2 with each snack and started on FiberCon 2 tablets twice daily and OTC Imodium as needed.  Reported 30 pound weight loss since starting Mounjaro.   Previous GI workup by Dr. Unk included negative celiac labs and normal colonoscopies.  Biopsies negative for microscopic colitis.   Flexible sigmoidoscopy 05/2022 by Dr. Unk: Severe rectosigmoid diverticulosis, otherwise normal.  Biopsies negative for microscopic colitis.   Colonoscopy 10/2018, found to have sigmoid diverticulosis, internal hemorrhoids    Colonoscopy in 2014, to evaluate chronic diarrhea, showed biopsies negative for microscopic colitis.  Past Medical History:  Diagnosis Date   Anemia    distant past   Anxiety    Arthritis    everywhere -  big toes worst   Chronic atrial fibrillation (HCC)    a. on eliquis ; b. CHADS2VASc at least 2 (age x 1, female)   Depression    Ecchymosis 10/06/2024   GERD (gastroesophageal reflux disease)    RARE    Heart murmur    mild - followed by PCP   Incontinence 10/12/2024   Mixed incontinence; W/U Status: confirmed     Knee pain    Mixed incontinence 10/12/2024   Morbid (severe) obesity due to excess calories (HCC) 10/12/2024   Motion sickness    back seat of car   OSA on CPAP    CPAP-4 PSI   S/P total knee arthroplasty 03/25/2018   Seasonal allergies    takes allergy weekly   Status post bilateral breast reduction 09/06/2016   Overview:  08/29/16   Stroke [facility])     Past Surgical History:  Procedure Laterality Date   BREAST REDUCTION SURGERY Bilateral 08/29/2016   Procedure: BILATERAL MAMMARY REDUCTION  (BREAST)WITH LIPOSUCTION;  Surgeon: Estefana GORMAN Fritter, DO;  Location: [facility];  Service: Plastics;  Laterality: Bilateral;   CATARACT EXTRACTION W/PHACO Left 05/03/2015   Procedure: CATARACT EXTRACTION PHACO AND INTRAOCULAR LENS PLACEMENT (IOC);  Surgeon: Dene Etienne, MD;  Location: [facility];  Service: Ophthalmology;  Laterality: Left;  CPAP   CATARACT EXTRACTION W/PHACO Right 10/09/2016   Procedure: CATARACT EXTRACTION PHACO AND INTRAOCULAR LENS PLACEMENT (IOC);  Surgeon: Dene Etienne, MD;  Location: [facility];  Service: Ophthalmology;  Laterality: Right;  sleep apnea   CHONDROPLASTY Right 04/29/2016   Procedure: CHONDROPLASTY;  Surgeon: Lynwood SHAUNNA Hue, MD;  Location: [facility];  Service: Orthopedics;  Laterality: Right;   COLONOSCOPY N/A 10/11/2024   Procedure: COLONOSCOPY;  Surgeon: Jinny Carmine, MD;  Location: [facility];  Service: Endoscopy;  Laterality: N/A;   COLONOSCOPY N/A 10/12/2024   Procedure: COLONOSCOPY;  Surgeon: Jinny Carmine, MD;  Location: [facility];  Service: Endoscopy;  Laterality: N/A;   COLONOSCOPY WITH PROPOFOL  N/A 10/24/2018   Procedure: COLONOSCOPY WITH PROPOFOL ;  Surgeon: Janalyn Keene NOVAK, MD;  Location: [facility];  Service: Endoscopy;  Laterality: N/A;   EYE SURGERY     FLEXIBLE  SIGMOIDOSCOPY N/A 05/30/2022   Procedure: FLEXIBLE SIGMOIDOSCOPY;  Surgeon: Unk Corinn Skiff, MD;  Location: [facility];  Service: Gastroenterology;  Laterality: N/A;   IR ANGIOGRAM VISCERAL SELECTIVE  10/08/2024   KNEE ARTHROPLASTY Right 03/25/2018   Procedure: COMPUTER ASSISTED TOTAL KNEE ARTHROPLASTY;  Surgeon: Hue Lynwood SHAUNNA, MD;  Location: [facility];  Service: Orthopedics;  Laterality: Right;   KNEE ARTHROSCOPY WITH LATERAL MENISECTOMY  04/29/2016   Procedure: KNEE ARTHROSCOPY WITH LATERAL MENISECTOMY;  Surgeon: Lynwood SHAUNNA Hue, MD;  Location: [facility];  Service: Orthopedics;;   KNEE ARTHROSCOPY WITH MEDIAL MENISECTOMY  04/29/2016   Procedure: KNEE ARTHROSCOPY WITH MEDIAL MENISECTOMY;  Surgeon: Lynwood SHAUNNA Hue, MD;  Location: [facility];  Service: Orthopedics;;   POLYPECTOMY  10/12/2024   Procedure: POLYPECTOMY, INTESTINE;  Surgeon: Jinny Carmine, MD;  Location: [facility];  Service: Endoscopy;;   REDUCTION MAMMAPLASTY Bilateral 09/2016   RETINAL DETACHMENT SURGERY Left May 03, 2015   Dr. Etienne, [facility]   TUBAL LIGATION      Current Outpatient Medications  Medication Sig Dispense Refill   apixaban  (ELIQUIS ) 5 MG TABS tablet Take 1 tablet (5 mg total) by mouth 2 (two) times daily.     Azelastine  HCl 137 MCG/SPRAY SOLN Place 1 spray into both nostrils daily.     clobetasol cream (TEMOVATE) 0.05 %  Apply topically.     fexofenadine (ALLEGRA) 180 MG tablet Take by mouth.     fluticasone  (FLONASE  ALLERGY RELIEF) 50 MCG/ACT nasal spray 1 spray in each nostril Nasally Twice a day as needed for allergy symptoms     GLUCOSAMINE-CHONDROITIN DS PO Take 3,000 mg by mouth daily.     ketorolac  (ACULAR ) 0.4 % SOLN Apply to eye.     lipase/protease/amylase (CREON ) 36000 UNITS CPEP capsule Take 3 capsules with the first bite of each meal and 2 capsule with the first bite of each snack 390 capsule 10   losartan  (COZAAR ) 25 MG tablet Take 0.5 tablets (12.5 mg total) by mouth daily.     Magnesium   250 MG TABS Take 250 mg by mouth daily.     Misc Natural Products (GLUCOSAMINE CHONDROITIN MSM) TABS 1500mg  Orally twice a day for joint health     Multiple Vitamin (MULTIVITAMIN WITH MINERALS) TABS tablet Take 1 tablet by mouth daily. One-A-Day Active 65+     nitrofurantoin (MACRODANTIN) 100 MG capsule Take 100 mg by mouth 2 (two) times daily.     nystatin  cream (MYCOSTATIN ) Apply 1 application topically 2 (two) times daily. 30 g 0   Olopatadine  HCl 0.2 % SOLN Place 1 drop into both eyes daily.     sertraline  (ZOLOFT ) 50 MG tablet TAKE 1 TABLET BY MOUTH EVERY DAY 90 tablet 1   tirzepatide (MOUNJARO) 7.5 MG/0.5ML Pen Inject 7.5 mg into the skin once a week.     No current facility-administered medications for this visit.    Allergies as of 10/19/2024 - Review Complete 10/12/2024  Allergen Reaction Noted   Apple Anaphylaxis 05/03/2015   Apple fruit extract Anaphylaxis 05/03/2015   Iodinated contrast media Other (See Comments) and Shortness Of Breath 04/28/2015   Silicone Other (See Comments) 01/29/2018   Tape Other (See Comments) 04/28/2015   Wound dressing adhesive Itching 04/28/2015    Family History  Problem Relation Age of Onset   Alcoholism Other    Heart disease Other    Breast cancer Neg Hx     Social History   Socioeconomic History   Marital status: Widowed    Spouse name: Not on file   Number of children: Not on file   Years of education: Not on file   Highest education level: Not on file  Occupational History   Not on file  Tobacco Use   Smoking status: Former    Current packs/day: 0.00    Average packs/day: 0.3 packs/day for 2.0 years (0.5 ttl pk-yrs)    Types: Cigarettes    Start date: 10/22/1967    Quit date: 10/21/1969    Years since quitting: 55.0   Smokeless tobacco: Never  Vaping Use   Vaping status: Never Used  Substance and Sexual Activity   Alcohol use: Yes    Alcohol/week: 7.0 standard drinks of alcohol    Types: 7 Glasses of wine per week     Comment: wine with dinner   Drug use: No   Sexual activity: Not on file  Other Topics Concern   Not on file  Social History Narrative   Not on file   Social Drivers of Health   Tobacco Use: Medium Risk (10/19/2024)   Patient History    Smoking Tobacco Use: Former    Smokeless Tobacco Use: Never    Passive Exposure: Not on Actuary Strain: Not on file  Food Insecurity: No Food Insecurity (10/08/2024)   Epic    Worried  About Running Out of Food in the Last Year: Never true    Ran Out of Food in the Last Year: Never true  Transportation Needs: No Transportation Needs (10/08/2024)   Epic    Lack of Transportation (Medical): No    Lack of Transportation (Non-Medical): No  Physical Activity: Not on file  Stress: Not on file  Social Connections: Moderately Integrated (10/08/2024)   Social Connection and Isolation Panel    Frequency of Communication with Friends and Family: More than three times a week    Frequency of Social Gatherings with Friends and Family: More than three times a week    Attends Religious Services: More than 4 times per year    Active Member of [employer] or Organizations: Yes    Attends Banker Meetings: More than 4 times per year    Marital Status: Widowed  Intimate Partner Violence: Not At Risk (10/08/2024)   Epic    Fear of Current or Ex-Partner: No    Emotionally Abused: No    Physically Abused: No    Sexually Abused: No  Depression (PHQ2-9): Low Risk (01/10/2023)   Depression (PHQ2-9)    PHQ-2 Score: 0  Alcohol Screen: Not on file  Housing: Low Risk (10/08/2024)   Epic    Unable to Pay for Housing in the Last Year: No    Number of Times Moved in the Last Year: 0    Homeless in the Last Year: No  Utilities: Not At Risk (10/08/2024)   Epic    Threatened with loss of utilities: No  Health Literacy: Not on file     RELEVANT GI HISTORY, IMAGING AND LABS: CBC    Component Value Date/Time   WBC 10.9 (H) 10/12/2024 0331    RBC 3.33 (L) 10/12/2024 0331   HGB 10.1 (L) 10/12/2024 1238   HGB 13.4 09/04/2015 1051   HCT 31.3 (L) 10/12/2024 0331   HCT 39.9 09/04/2015 1051   PLT 192 10/12/2024 0331   PLT 227 09/04/2015 1051   MCV 94.0 10/12/2024 0331   MCV 90 09/04/2015 1051   MCV 92 05/10/2014 1847   MCH 30.9 10/12/2024 0331   MCHC 32.9 10/12/2024 0331   RDW 14.3 10/12/2024 0331   RDW 14.4 09/04/2015 1051   RDW 14.7 (H) 05/10/2014 1847   LYMPHSABS 2.2 10/10/2024 0355   LYMPHSABS 1.5 09/04/2015 1051   MONOABS 1.2 (H) 10/10/2024 0355   EOSABS 0.6 (H) 10/10/2024 0355   EOSABS 0.2 09/04/2015 1051   BASOSABS 0.1 10/10/2024 0355   BASOSABS 0.0 09/04/2015 1051   Recent Labs    10/08/24 1030 10/08/24 2037 10/09/24 0235 10/10/24 0355 10/10/24 1513 10/11/24 0402 10/12/24 0331 10/12/24 1238  HGB 12.7 12.4 12.1 10.1* 11.1* 11.0* 10.3* 10.1*    CMP     Component Value Date/Time   NA 143 10/12/2024 0331   NA 146 (H) 09/04/2015 1051   NA 143 05/10/2014 1847   K 3.5 10/12/2024 0331   K 3.8 05/10/2014 1847   CL 108 10/12/2024 0331   CL 108 (H) 05/10/2014 1847   CO2 26 10/12/2024 0331   CO2 29 05/10/2014 1847   GLUCOSE 82 10/12/2024 0331   GLUCOSE 119 (H) 05/10/2014 1847   BUN 16 10/12/2024 0331   BUN 15 09/04/2015 1051   BUN 12 05/10/2014 1847   CREATININE 0.74 10/12/2024 0331   CREATININE 0.91 05/10/2014 1847   CALCIUM  8.2 (L) 10/12/2024 0331   CALCIUM  8.7 05/10/2014 1847   PROT 6.5 10/08/2024 1030  PROT 6.6 09/04/2015 1051   PROT 7.4 05/10/2014 1847   ALBUMIN 4.1 10/08/2024 1030   ALBUMIN 4.4 09/04/2015 1051   ALBUMIN 3.9 05/10/2014 1847   AST 18 10/08/2024 1030   AST 14 (L) 05/10/2014 1847   ALT 14 10/08/2024 1030   ALT 28 05/10/2014 1847   ALKPHOS 53 10/08/2024 1030   ALKPHOS 55 05/10/2014 1847   BILITOT 0.4 10/08/2024 1030   BILITOT 0.4 09/04/2015 1051   BILITOT 0.5 05/10/2014 1847   GFRNONAA >60 10/12/2024 0331   GFRNONAA >60 05/10/2014 1847   GFRAA >60 04/28/2020 1217   GFRAA  >60 05/10/2014 1847      Latest Ref Rng & Units 10/08/2024   10:30 AM 03/28/2022   10:28 AM 11/02/2021    9:04 AM  Hepatic Function  Total Protein 6.5 - 8.1 g/dL 6.5  6.5  6.6   Albumin 3.5 - 5.0 g/dL 4.1  4.2  4.2   AST 15 - 41 U/L 18  15  18    ALT 0 - 44 U/L 14  15  16    Alk Phosphatase 38 - 126 U/L 53  54  54   Total Bilirubin 0.0 - 1.2 mg/dL 0.4  0.9  0.5       Review of Systems   All systems reviewed and negative except where noted in HPI.    Physical Exam  BP 116/71   Pulse 71   Temp 98.1 F (36.7 C)   Ht 5' 5 (1.651 m)   Wt 200 lb 6.4 oz (90.9 kg)   SpO2 97%   BMI 33.35 kg/m  No LMP recorded. Patient is postmenopausal. General:   Alert and oriented. Pleasant and cooperative. Well-nourished and well-developed.  In no acute distress Head:  Normocephalic and atraumatic. Eyes:  Without icterus Ears:  Normal auditory acuity. Lungs:  Respirations even and unlabored.  Clear throughout to auscultation.   No wheezes, crackles, or rhonchi. No acute distress. Heart:  Regular rate and rhythm; no murmurs, clicks, rubs, or gallops. Abdomen:  Normal bowel sounds.  No bruits.  Soft, non-tender and non-distended without masses, hepatosplenomegaly or hernias noted.  No guarding or rebound tenderness.   Rectal:  Deferred. Msk:  Symmetrical without gross deformities. Normal posture. Extremities:  Without edema. Neurologic:  Alert and  oriented x4;  grossly normal neurologically. Skin:  Intact without significant lesions or rashes. Psych:  Alert and cooperative. Normal mood and affect.   Assessment & Plan   Naziya Cardona is a 78 y.o. female presenting today with recent GI bleed, history of chronic diarrhea, EPI.    GI Bleed - No further episodes rectal bleeding reported.  Patient to keep follow-up with PCP on Friday to monitor labs.  Chronic diarrhea and fecal incontinence.  Long history of diarrhea, actually having formed stools currently. - Patient instructed to start taking  fiber supplementation either Benefiber or Metamucil.  Increase water intake - Could consider anorectal manometry if diarrhea persists  Exocrine pancreatic insufficiency.  Patient will bring Creon  form back to the office for annual re-enrollment so that it can be filled out.  - increase Creon  36000 units to 4 capsules with meals, 2 with snacks  Follow-up in 6 weeks   Grayce Bohr, DNP, AGNP-C [facility]   "

## 2024-10-19 ENCOUNTER — Encounter: Payer: Self-pay | Admitting: Family Medicine

## 2024-10-19 ENCOUNTER — Ambulatory Visit: Admitting: Family Medicine

## 2024-10-19 VITALS — BP 116/71 | HR 71 | Temp 98.1°F | Ht 65.0 in | Wt 200.4 lb

## 2024-10-19 DIAGNOSIS — R159 Full incontinence of feces: Secondary | ICD-10-CM | POA: Diagnosis not present

## 2024-10-19 DIAGNOSIS — K922 Gastrointestinal hemorrhage, unspecified: Secondary | ICD-10-CM | POA: Diagnosis not present

## 2024-10-19 DIAGNOSIS — K8689 Other specified diseases of pancreas: Secondary | ICD-10-CM | POA: Diagnosis not present

## 2024-10-19 DIAGNOSIS — K5791 Diverticulosis of intestine, part unspecified, without perforation or abscess with bleeding: Secondary | ICD-10-CM

## 2024-10-19 NOTE — Patient Instructions (Addendum)
 Start taking Creon  4 pills with meals and 2 with snacks.  Can also try Benefiber to help bulk up stools.

## 2024-10-29 ENCOUNTER — Other Ambulatory Visit: Payer: Self-pay | Admitting: Internal Medicine

## 2024-10-29 ENCOUNTER — Ambulatory Visit
Admission: RE | Admit: 2024-10-29 | Discharge: 2024-10-29 | Disposition: A | Attending: Internal Medicine | Admitting: Internal Medicine

## 2024-10-29 ENCOUNTER — Ambulatory Visit
Admission: RE | Admit: 2024-10-29 | Discharge: 2024-10-29 | Disposition: A | Source: Ambulatory Visit | Attending: Internal Medicine

## 2024-10-29 DIAGNOSIS — M25519 Pain in unspecified shoulder: Secondary | ICD-10-CM | POA: Insufficient documentation

## 2024-11-16 LAB — GENECONNECT MOLECULAR SCREEN: Genetic Analysis Overall Interpretation: NEGATIVE

## 2024-11-17 ENCOUNTER — Telehealth: Payer: Self-pay

## 2024-11-17 NOTE — Telephone Encounter (Signed)
 Received documentation from [facility] approval for creon . Patient aware.Approved 10/20/25.

## 2024-12-21 ENCOUNTER — Ambulatory Visit: Admitting: Gastroenterology
